# Patient Record
Sex: Female | Born: 1945
Health system: Southern US, Community
[De-identification: ages and names within clinical notes are randomized; demographics above are authoritative.]

## PROBLEM LIST (undated history)

## (undated) DIAGNOSIS — M1990 Unspecified osteoarthritis, unspecified site: Secondary | ICD-10-CM

## (undated) DIAGNOSIS — B019 Varicella without complication: Secondary | ICD-10-CM

## (undated) DIAGNOSIS — E669 Obesity, unspecified: Secondary | ICD-10-CM

## (undated) DIAGNOSIS — I499 Cardiac arrhythmia, unspecified: Secondary | ICD-10-CM

## (undated) DIAGNOSIS — T7840XA Allergy, unspecified, initial encounter: Secondary | ICD-10-CM

## (undated) DIAGNOSIS — H269 Unspecified cataract: Secondary | ICD-10-CM

## (undated) DIAGNOSIS — J09X2 Influenza due to identified novel influenza A virus with other respiratory manifestations: Secondary | ICD-10-CM

## (undated) DIAGNOSIS — J189 Pneumonia, unspecified organism: Secondary | ICD-10-CM

## (undated) DIAGNOSIS — M2550 Pain in unspecified joint: Secondary | ICD-10-CM

## (undated) DIAGNOSIS — E785 Hyperlipidemia, unspecified: Secondary | ICD-10-CM

## (undated) DIAGNOSIS — M549 Dorsalgia, unspecified: Secondary | ICD-10-CM

## (undated) DIAGNOSIS — R0789 Other chest pain: Secondary | ICD-10-CM

## (undated) DIAGNOSIS — I471 Supraventricular tachycardia, unspecified: Secondary | ICD-10-CM

## (undated) DIAGNOSIS — M419 Scoliosis, unspecified: Secondary | ICD-10-CM

## (undated) DIAGNOSIS — Z8719 Personal history of other diseases of the digestive system: Secondary | ICD-10-CM

## (undated) DIAGNOSIS — K219 Gastro-esophageal reflux disease without esophagitis: Secondary | ICD-10-CM

## (undated) HISTORY — PX: COLONOSCOPY WITH ESOPHAGOGASTRODUODENOSCOPY (EGD) AND ESOPHAGEAL DILATION (ED): SHX6495

## (undated) HISTORY — PX: BACK SURGERY: SHX140

## (undated) HISTORY — DX: Allergy, unspecified, initial encounter: T78.40XA

## (undated) HISTORY — DX: Scoliosis, unspecified: M41.9

## (undated) HISTORY — DX: Unspecified cataract: H26.9

## (undated) HISTORY — DX: Hyperlipidemia, unspecified: E78.5

## (undated) HISTORY — PX: CARDIAC ELECTROPHYSIOLOGY STUDY AND ABLATION: SHX1294

## (undated) HISTORY — DX: Unspecified osteoarthritis, unspecified site: M19.90

## (undated) HISTORY — DX: Gastro-esophageal reflux disease without esophagitis: K21.9

## (undated) HISTORY — PX: BREAST EXCISIONAL BIOPSY: SUR124

## (undated) HISTORY — DX: Other chest pain: R07.89

## (undated) HISTORY — DX: Obesity, unspecified: E66.9

## (undated) HISTORY — DX: Influenza due to identified novel influenza A virus with other respiratory manifestations: J09.X2

## (undated) HISTORY — DX: Varicella without complication: B01.9

## (undated) HISTORY — PX: JOINT REPLACEMENT: SHX530

## (undated) HISTORY — PX: FOOT SURGERY: SHX648

## (undated) HISTORY — PX: BREAST BIOPSY: SHX20

## (undated) HISTORY — DX: Supraventricular tachycardia: I47.1

## (undated) HISTORY — DX: Supraventricular tachycardia, unspecified: I47.10

## (undated) HISTORY — PX: SPINE SURGERY: SHX786

## (undated) HISTORY — PX: ABDOMINAL HYSTERECTOMY: SHX81

---

## 1988-05-19 HISTORY — PX: BREAST SURGERY: SHX581

## 1988-05-19 HISTORY — PX: PARTIAL HYSTERECTOMY: SHX80

## 2002-05-02 ENCOUNTER — Encounter: Payer: Self-pay | Admitting: Internal Medicine

## 2002-05-02 ENCOUNTER — Encounter: Admission: RE | Admit: 2002-05-02 | Discharge: 2002-05-02 | Payer: Self-pay | Admitting: Internal Medicine

## 2002-05-19 HISTORY — PX: OTHER SURGICAL HISTORY: SHX169

## 2003-04-18 ENCOUNTER — Inpatient Hospital Stay (HOSPITAL_COMMUNITY): Admission: RE | Admit: 2003-04-18 | Discharge: 2003-04-23 | Payer: Self-pay | Admitting: Orthopaedic Surgery

## 2003-05-04 ENCOUNTER — Inpatient Hospital Stay (HOSPITAL_COMMUNITY): Admission: RE | Admit: 2003-05-04 | Discharge: 2003-05-07 | Payer: Self-pay | Admitting: Orthopaedic Surgery

## 2004-05-19 HISTORY — PX: TOTAL HIP ARTHROPLASTY: SHX124

## 2005-05-19 HISTORY — PX: COLONOSCOPY: SHX174

## 2005-05-19 HISTORY — PX: EYE SURGERY: SHX253

## 2006-05-19 HISTORY — PX: BREAST BIOPSY: SHX20

## 2007-02-23 ENCOUNTER — Ambulatory Visit: Payer: Self-pay | Admitting: Unknown Physician Specialty

## 2007-03-04 ENCOUNTER — Ambulatory Visit: Payer: Self-pay | Admitting: Unknown Physician Specialty

## 2007-04-01 ENCOUNTER — Ambulatory Visit: Payer: Self-pay | Admitting: Unknown Physician Specialty

## 2007-10-11 ENCOUNTER — Ambulatory Visit: Payer: Self-pay | Admitting: General Surgery

## 2008-03-21 ENCOUNTER — Ambulatory Visit: Payer: Self-pay | Admitting: General Surgery

## 2008-04-26 ENCOUNTER — Ambulatory Visit: Payer: Self-pay | Admitting: General Surgery

## 2008-05-19 DIAGNOSIS — J189 Pneumonia, unspecified organism: Secondary | ICD-10-CM

## 2008-05-19 HISTORY — DX: Pneumonia, unspecified organism: J18.9

## 2009-04-30 ENCOUNTER — Ambulatory Visit: Payer: Self-pay | Admitting: General Surgery

## 2010-05-01 ENCOUNTER — Ambulatory Visit: Payer: Self-pay | Admitting: General Surgery

## 2011-03-17 ENCOUNTER — Ambulatory Visit: Payer: Self-pay | Admitting: Internal Medicine

## 2011-05-23 ENCOUNTER — Ambulatory Visit: Payer: Self-pay | Admitting: General Surgery

## 2011-09-24 ENCOUNTER — Emergency Department: Payer: Self-pay | Admitting: *Deleted

## 2011-09-24 LAB — CBC
HCT: 45 % (ref 35.0–47.0)
HGB: 14.9 g/dL (ref 12.0–16.0)
MCV: 92 fL (ref 80–100)
Platelet: 208 10*3/uL (ref 150–440)
RBC: 4.87 10*6/uL (ref 3.80–5.20)
RDW: 13.8 % (ref 11.5–14.5)
WBC: 3.4 10*3/uL — ABNORMAL LOW (ref 3.6–11.0)

## 2011-09-24 LAB — BASIC METABOLIC PANEL
BUN: 15 mg/dL (ref 7–18)
Co2: 25 mmol/L (ref 21–32)
Creatinine: 0.75 mg/dL (ref 0.60–1.30)
EGFR (African American): >60
EGFR (Non-African Amer.): >60
Glucose: 97 mg/dL (ref 65–99)
Sodium: 143 mmol/L (ref 136–145)

## 2011-09-24 LAB — CK TOTAL AND CKMB (NOT AT ARMC)
CK, Total: 90 U/L (ref 21–215)
CK-MB: 1.3 ng/mL (ref 0.5–3.6)

## 2011-09-24 LAB — TROPONIN I: Troponin-I: <0.02 ng/mL

## 2011-09-25 ENCOUNTER — Encounter: Payer: Self-pay | Admitting: Cardiovascular Disease

## 2011-09-25 ENCOUNTER — Ambulatory Visit (INDEPENDENT_AMBULATORY_CARE_PROVIDER_SITE_OTHER): Payer: BC Managed Care – PPO | Admitting: Cardiovascular Disease

## 2011-09-25 VITALS — BP 127/86 | HR 71 | Ht 66.0 in | Wt 185.1 lb

## 2011-09-25 DIAGNOSIS — R0609 Other forms of dyspnea: Secondary | ICD-10-CM

## 2011-09-25 DIAGNOSIS — R0989 Other specified symptoms and signs involving the circulatory and respiratory systems: Secondary | ICD-10-CM

## 2011-09-25 DIAGNOSIS — R002 Palpitations: Secondary | ICD-10-CM | POA: Insufficient documentation

## 2011-09-25 DIAGNOSIS — R0600 Dyspnea, unspecified: Secondary | ICD-10-CM | POA: Insufficient documentation

## 2011-09-25 NOTE — Progress Notes (Signed)
HPI  This is a 66 year old female who was referred from [facility] ER for evaluation of palpitations. She has no previous cardiac history and no significant chronic medical conditions. She presented there yesterday with sudden onset of palpitations and fast heart beats which happened at home while she was resting. This lasted for 30 minutes and by the time she arrived to the emergency room it was resolved. Her ECG was unremarkable, her labs showed no evidence of any mucoid volume depletion. She was mildly hypokalemic with a potassium of 3.4. Troponin and cardiac enzymes were normal. Chest x-ray showed no acute abnormalities. The patient reports no previous similar episodes. The palpitations were associated with dyspnea. She has not had any recent chest pain. She is able to perform all activities of daily living without significant limitations. She is not aware of any previous history of arrhythmia.  No Known Allergies   Current Outpatient Prescriptions on File Prior to Visit  Medication Sig Dispense Refill  . estradiol (ESTRACE) 2 MG tablet Take 2 mg by mouth daily.         Past Medical History  Diagnosis Date  . Cardiac disease   . Thyroid disease      Past Surgical History  Procedure Date  . Partial hysterectomy 1990  . Total hip arthroplasty 2006  . Other surgical history 2004    Bilateral cataracts  . Back surgery 1960's  . Foot surgery 1960's     Family History  Problem Relation Age of Onset  . Heart attack Father   . Heart disease Father      History   Social History  . Marital Status: Married    Spouse Name: N/A    Number of Children: N/A  . Years of Education: N/A   Occupational History  . Not on file.   Social History Main Topics  . Smoking status: Never Smoker   . Smokeless tobacco: Not on file  . Alcohol Use: 0.6 oz/week    1 Glasses of wine per week  . Drug Use: No  . Sexually Active:    Other Topics Concern  . Not on file   Social History  Narrative  . No narrative on file     ROS Constitutional: Negative for fever, chills, diaphoresis, activity change, appetite change and fatigue.  HENT: Negative for hearing loss, nosebleeds, congestion, sore throat, facial swelling, drooling, trouble swallowing, neck pain, voice change, sinus pressure and tinnitus.  Eyes: Negative for photophobia, pain, discharge and visual disturbance.  Respiratory: Negative for apnea, cough, chest tightness and wheezing.  Cardiovascular: Negative for chest pain  and leg swelling.  Gastrointestinal: Negative for nausea, vomiting, abdominal pain, diarrhea, constipation, blood in stool and abdominal distention.  Genitourinary: Negative for dysuria, urgency, frequency, hematuria and decreased urine volume.  Musculoskeletal: Negative for myalgias, back pain, joint swelling, arthralgias and gait problem.  Skin: Negative for color change, pallor, rash and wound.  Neurological: Negative for dizziness, tremors, seizures, syncope, speech difficulty, weakness, light-headedness, numbness and headaches.  Psychiatric/Behavioral: Negative for suicidal ideas, hallucinations, behavioral problems and agitation. The patient is not nervous/anxious.     PHYSICAL EXAM   BP 127/86  Pulse 71  Ht 5\' 6"  (1.676 m)  Wt 185 lb 1.9 oz (83.97 kg)  BMI 29.88 kg/m2 Constitutional: She is oriented to person, place, and time. She appears well-developed and well-nourished. No distress.  HENT: No nasal discharge.  Head: Normocephalic and atraumatic.  Eyes: Pupils are equal and round. Right eye exhibits no  discharge. Left eye exhibits no discharge.  Neck: Normal range of motion. Neck supple. No JVD present. No thyromegaly present.  Cardiovascular: Normal rate, regular rhythm, normal heart sounds. Exam reveals no gallop and no friction rub. No murmur heard.  Pulmonary/Chest: Effort normal and breath sounds normal. No stridor. No respiratory distress. She has no wheezes. She has no  rales. She exhibits no tenderness.  Abdominal: Soft. Bowel sounds are normal. She exhibits no distension. There is no tenderness. There is no rebound and no guarding.  Musculoskeletal: Normal range of motion. She exhibits no edema and no tenderness.  Neurological: She is alert and oriented to person, place, and time. Coordination normal.  Skin: Skin is warm and dry. No rash noted. She is not diaphoretic. No erythema. No pallor.  Psychiatric: She has a normal mood and affect. Her behavior is normal. Judgment and thought content normal.     EKG: Her ECG from yesterday was reviewed which showed normal sinus rhythm with poor R-wave progression in the anterior leads. No significant ST or T wave changes. Normal PR and QT intervals.   ASSESSMENT AND PLAN

## 2011-09-25 NOTE — Assessment & Plan Note (Signed)
The patient had a recent and a sudden episode of palpitations yesterday which lasted for about 30 minutes and resolves continuously. It was associated with dyspnea and mild dizziness without syncope or presyncope. I recommend a 48-hour Holter monitor for further evaluation. Treatment with a beta blocker or calcium channel blocker will be considered based on the results. The patient had no previous similar episodes. She does not consume caffeinated products and does not smoke. Her thyroid function was normal. There is no clear reversible causes.

## 2011-09-25 NOTE — Assessment & Plan Note (Signed)
I recommend an echocardiogram to evaluate for any structural heart abnormalities. The patient reports no chest pain and there is really no suspicion for angina. A stress test will not be pursued at this time.

## 2011-09-25 NOTE — Patient Instructions (Signed)
Your physician has requested that you have an echocardiogram. Echocardiography is a painless test that uses sound waves to create images of your heart. It provides your doctor with information about the size and shape of your heart and how well your heart's chambers and valves are working. This procedure takes approximately one hour. There are no restrictions for this procedure.  Your physician has recommended that you wear a holter monitor. Holter monitors are medical devices that record the heart's electrical activity. Doctors most often use these monitors to diagnose arrhythmias. Arrhythmias are problems with the speed or rhythm of the heartbeat. The monitor is a small, portable device. You can wear one while you do your normal daily activities. This is usually used to diagnose what is causing palpitations/syncope (passing out).  Follow up after tests.  

## 2011-10-14 ENCOUNTER — Telehealth: Payer: Self-pay | Admitting: Cardiovascular Disease

## 2011-10-14 ENCOUNTER — Other Ambulatory Visit: Payer: Self-pay

## 2011-10-14 DIAGNOSIS — R55 Syncope and collapse: Secondary | ICD-10-CM

## 2011-10-14 NOTE — Telephone Encounter (Signed)
Pt was to come and pick up a monitor tomorrow but needs to cancel. Wants to know if she can come another day or can she get it from [facility]. I told pt that we may not have one for her to get tomorrow

## 2011-10-14 NOTE — Telephone Encounter (Signed)
Notified patient need to be at [facility] at 10:00 am. The patient understands and will follow instructions.

## 2011-10-15 ENCOUNTER — Other Ambulatory Visit: Payer: BC Managed Care – PPO

## 2011-10-17 DIAGNOSIS — R55 Syncope and collapse: Secondary | ICD-10-CM

## 2011-10-27 ENCOUNTER — Ambulatory Visit (INDEPENDENT_AMBULATORY_CARE_PROVIDER_SITE_OTHER): Payer: BC Managed Care – PPO | Admitting: Cardiovascular Disease

## 2011-10-27 ENCOUNTER — Other Ambulatory Visit (INDEPENDENT_AMBULATORY_CARE_PROVIDER_SITE_OTHER): Payer: BC Managed Care – PPO

## 2011-10-27 ENCOUNTER — Other Ambulatory Visit: Payer: BC Managed Care – PPO

## 2011-10-27 ENCOUNTER — Encounter: Payer: Self-pay | Admitting: Cardiovascular Disease

## 2011-10-27 VITALS — BP 112/70 | HR 68 | Ht 66.0 in | Wt 181.0 lb

## 2011-10-27 DIAGNOSIS — R002 Palpitations: Secondary | ICD-10-CM

## 2011-10-27 DIAGNOSIS — R0600 Dyspnea, unspecified: Secondary | ICD-10-CM

## 2011-10-27 DIAGNOSIS — R0989 Other specified symptoms and signs involving the circulatory and respiratory systems: Secondary | ICD-10-CM

## 2011-10-27 DIAGNOSIS — R0609 Other forms of dyspnea: Secondary | ICD-10-CM

## 2011-10-27 NOTE — Patient Instructions (Addendum)
Your echocardiogram was fine.  Follow up as needed. Call us if you get more palpitations, shortness of breath or chest pain.

## 2011-10-30 NOTE — Assessment & Plan Note (Signed)
She has not had any further episodes. Holter monitor only showed occasional premature beats without significant arrhythmia. Echocardiogram also showed no significant abnormalities. No further cardiac workup at this time. Treatment with a beta blocker or calcium channel blockers can be considered if there are recurrent symptoms. In the meantime, I asked her to monitor her symptoms and notify us if there are any new issues. She is to followup as needed.

## 2011-10-30 NOTE — Progress Notes (Signed)
    HPI  This is a 66 year old female who is here today for a followup visit regarding palpitations. She has no previous cardiac history and no significant chronic medical conditions. She was seen at [facility] ER for palpitations that lasted about 30 minutes. There was no documented arrhythmia. Her ECG was unremarkable, her labs showed  mildl hypokalia with a potassium of 3.4. Troponin and cardiac enzymes were normal. Chest x-ray showed no acute abnormalities.  She underwent evaluation with a 48 hour Holter monitor which showed occasional PVCs and PVCs without any significant arrhythmia. She had an echocardiogram done which showed normal LV systolic function, no significant valvular abnormalities and only borderline elevation of pulmonary pressure. Overall, she is feeling better. She has not had any further palpitations. She denies chest pain or dyspnea.  No Known Allergies   Current Outpatient Prescriptions on File Prior to Visit  Medication Sig Dispense Refill  . estradiol (ESTRACE) 2 MG tablet Take 2 mg by mouth daily.         Past Medical History  Diagnosis Date  . Cardiac disease      Past Surgical History  Procedure Date  . Partial hysterectomy 1990  . Total hip arthroplasty 2006  . Other surgical history 2004    Bilateral cataracts  . Back surgery 1960's  . Foot surgery 1960's     Family History  Problem Relation Age of Onset  . Heart attack Father   . Heart disease Father      History   Social History  . Marital Status: Married    Spouse Name: N/A    Number of Children: N/A  . Years of Education: N/A   Occupational History  . Not on file.   Social History Main Topics  . Smoking status: Never Smoker   . Smokeless tobacco: Not on file  . Alcohol Use: 0.6 oz/week    1 Glasses of wine per week  . Drug Use: No  . Sexually Active:    Other Topics Concern  . Not on file   Social History Narrative  . No narrative on file     PHYSICAL EXAM   BP 112/70   Pulse 68  Ht 5\' 6"  (1.676 m)  Wt 181 lb (82.101 kg)  BMI 29.21 kg/m2  Constitutional: She is oriented to person, place, and time. She appears well-developed and well-nourished. No distress.  HENT: No nasal discharge.  Head: Normocephalic and atraumatic.  Eyes: Pupils are equal and round. Right eye exhibits no discharge. Left eye exhibits no discharge.  Neck: Normal range of motion. Neck supple. No JVD present. No thyromegaly present.  Cardiovascular: Normal rate, regular rhythm, normal heart sounds. Exam reveals no gallop and no friction rub. No murmur heard.  Pulmonary/Chest: Effort normal and breath sounds normal. No stridor. No respiratory distress. She has no wheezes. She has no rales. She exhibits no tenderness.  Abdominal: Soft. Bowel sounds are normal. She exhibits no distension. There is no tenderness. There is no rebound and no guarding.  Musculoskeletal: Normal range of motion. She exhibits no edema and no tenderness.  Neurological: She is alert and oriented to person, place, and time. Coordination normal.  Skin: Skin is warm and dry. No rash noted. She is not diaphoretic. No erythema. No pallor.  Psychiatric: She has a normal mood and affect. Her behavior is normal. Judgment and thought content normal.      ASSESSMENT AND PLAN

## 2011-11-03 ENCOUNTER — Other Ambulatory Visit: Payer: Self-pay | Admitting: Cardiovascular Disease

## 2011-11-03 DIAGNOSIS — R55 Syncope and collapse: Secondary | ICD-10-CM

## 2012-05-24 ENCOUNTER — Ambulatory Visit: Payer: Self-pay | Admitting: General Surgery

## 2012-11-15 ENCOUNTER — Encounter: Payer: Self-pay | Admitting: *Deleted

## 2013-06-06 ENCOUNTER — Ambulatory Visit: Payer: Self-pay | Admitting: General Surgery

## 2013-06-07 ENCOUNTER — Ambulatory Visit: Payer: Self-pay | Admitting: General Surgery

## 2013-06-07 ENCOUNTER — Encounter: Payer: Self-pay | Admitting: General Surgery

## 2013-06-20 ENCOUNTER — Encounter: Payer: Self-pay | Admitting: General Surgery

## 2013-06-20 ENCOUNTER — Ambulatory Visit (INDEPENDENT_AMBULATORY_CARE_PROVIDER_SITE_OTHER): Payer: BC Managed Care – PPO | Admitting: General Surgery

## 2013-06-20 VITALS — BP 140/74 | HR 72 | Resp 14 | Ht 66.0 in | Wt 187.0 lb

## 2013-06-20 DIAGNOSIS — N6019 Diffuse cystic mastopathy of unspecified breast: Secondary | ICD-10-CM

## 2013-06-20 DIAGNOSIS — Z803 Family history of malignant neoplasm of breast: Secondary | ICD-10-CM

## 2013-06-20 DIAGNOSIS — Z1239 Encounter for other screening for malignant neoplasm of breast: Secondary | ICD-10-CM

## 2013-06-20 NOTE — Patient Instructions (Signed)
Patient to return in 1 year with bilateral screening mammogram. Patient to continue self breast checks monthly. Patient to call with any new questions or concerns.

## 2013-06-20 NOTE — Progress Notes (Signed)
Patient ID: Kristy Mejia, female   DOB: 02/09/1946, 68 y.o.   MRN: [id number]  Chief Complaint  Patient presents with  . Follow-up    mammogram    HPI Kristy Mejia is a 68 y.o. female who presents for a breast evaluation. The most recent mammogram was done on 06/06/13. Patient does not perform regular self breast checks but does get regular mammograms done.  She denies any problems with her breasts at this time.   HPI  Past Medical History  Diagnosis Date  . Cardiac disease   . Family history of malignant neoplasm of breast 2014  . Breast complaint 1990  . Obesity, unspecified   . Special screening for malignant neoplasms, colon   . Eye problems 2007    eye condition  . Arthritis   . Diffuse cystic mastopathy     Past Surgical History  Procedure Laterality Date  . Partial hysterectomy  1990  . Total hip arthroplasty  2006  . Other surgical history  2004    Bilateral cataracts  . Back surgery  1960's    d/t scoliosis-1964/65?  Marland Kitchen. Foot surgery  1960's  . Colonoscopy  2007    Dr. Mechele CollinElliott  . Eye surgery  2007    cataracts  . Breast surgery  1990    excision breast mass    Family History  Problem Relation Age of Onset  . Heart attack Father   . Heart disease Father   . Cancer Mother     breast cancer    Social History History  Substance Use Topics  . Smoking status: Never Smoker   . Smokeless tobacco: Not on file  . Alcohol Use: 0.6 oz/week    1 Glasses of wine per week    Allergies  Allergen Reactions  . Tape Itching, Rash and Other (See Comments)    blisters    Current Outpatient Prescriptions  Medication Sig Dispense Refill  . estradiol (ESTRACE) 2 MG tablet Take 2 mg by mouth daily.       No current facility-administered medications for this visit.    Review of Systems Review of Systems  Constitutional: Negative.   Respiratory: Negative.   Cardiovascular: Negative.     Blood pressure 140/74, pulse 72, resp. rate 14, height 5\' 6"  (1.676 m),  weight 187 lb (84.823 kg).  Physical Exam Physical Exam  Constitutional: She is oriented to person, place, and time. She appears well-developed and well-nourished.  Eyes: Conjunctivae are normal. No scleral icterus.  Neck: Neck supple. No thyromegaly present.  Cardiovascular: Normal rate, regular rhythm and normal heart sounds.   No murmur heard. Pulmonary/Chest: Effort normal and breath sounds normal. Right breast exhibits no inverted nipple, no mass, no nipple discharge, no skin change and no tenderness. Left breast exhibits no inverted nipple, no mass, no nipple discharge, no skin change and no tenderness.  Lymphadenopathy:    She has no cervical adenopathy.    She has no axillary adenopathy.  Neurological: She is alert and oriented to person, place, and time.  Skin: Skin is warm and dry.    Data Reviewed Mammogram reviewed   Assessment    Stable exam. FH of breast cancer    Plan    Patient to return in one year      Kristy Mejia 06/22/2013, 8:36 AM

## 2013-06-22 ENCOUNTER — Encounter: Payer: Self-pay | Admitting: General Surgery

## 2013-06-22 DIAGNOSIS — Z803 Family history of malignant neoplasm of breast: Secondary | ICD-10-CM | POA: Insufficient documentation

## 2013-11-28 ENCOUNTER — Ambulatory Visit: Payer: Self-pay | Admitting: Orthopedic Surgery

## 2013-11-30 ENCOUNTER — Ambulatory Visit: Payer: Self-pay | Admitting: Orthopedic Surgery

## 2014-03-14 ENCOUNTER — Other Ambulatory Visit: Payer: Self-pay | Admitting: Orthopedic Surgery

## 2014-03-20 ENCOUNTER — Encounter: Payer: Self-pay | Admitting: General Surgery

## 2014-04-07 ENCOUNTER — Ambulatory Visit (HOSPITAL_COMMUNITY)
Admission: RE | Admit: 2014-04-07 | Discharge: 2014-04-07 | Disposition: A | Discharge status: Home / Self Care | Payer: BC Managed Care – PPO | Source: Ambulatory Visit | Attending: Orthopedic Surgery | Admitting: Orthopedic Surgery

## 2014-04-07 ENCOUNTER — Encounter (HOSPITAL_COMMUNITY)
Admission: RE | Admit: 2014-04-07 | Discharge: 2014-04-07 | Disposition: A | Discharge status: Home / Self Care | Payer: BC Managed Care – PPO | Source: Ambulatory Visit | Attending: Orthopedic Surgery | Admitting: Orthopedic Surgery

## 2014-04-07 ENCOUNTER — Encounter (HOSPITAL_COMMUNITY): Payer: Self-pay

## 2014-04-07 DIAGNOSIS — M1990 Unspecified osteoarthritis, unspecified site: Secondary | ICD-10-CM | POA: Diagnosis not present

## 2014-04-07 DIAGNOSIS — E669 Obesity, unspecified: Secondary | ICD-10-CM | POA: Insufficient documentation

## 2014-04-07 DIAGNOSIS — M419 Scoliosis, unspecified: Secondary | ICD-10-CM | POA: Diagnosis not present

## 2014-04-07 DIAGNOSIS — Z01818 Encounter for other preprocedural examination: Secondary | ICD-10-CM | POA: Diagnosis present

## 2014-04-07 HISTORY — DX: Pain in unspecified joint: M25.50

## 2014-04-07 HISTORY — DX: Dorsalgia, unspecified: M54.9

## 2014-04-07 HISTORY — DX: Pneumonia, unspecified organism: J18.9

## 2014-04-07 LAB — CBC WITH DIFFERENTIAL/PLATELET
Basophils Absolute: 0.1 10*3/uL (ref 0.0–0.1)
Basophils Relative: 2 % — ABNORMAL HIGH (ref 0–1)
EOS ABS: 0.2 10*3/uL (ref 0.0–0.7)
EOS PCT: 5 % (ref 0–5)
HEMATOCRIT: 42.9 % (ref 36.0–46.0)
Hemoglobin: 14.1 g/dL (ref 12.0–15.0)
LYMPHS PCT: 43 % (ref 12–46)
Lymphs Abs: 1.5 10*3/uL (ref 0.7–4.0)
MCH: 30.2 pg (ref 26.0–34.0)
MCHC: 32.9 g/dL (ref 30.0–36.0)
MCV: 91.9 fL (ref 78.0–100.0)
MONO ABS: 0.4 10*3/uL (ref 0.1–1.0)
Monocytes Relative: 11 % (ref 3–12)
Neutro Abs: 1.4 10*3/uL — ABNORMAL LOW (ref 1.7–7.7)
Neutrophils Relative %: 39 % — ABNORMAL LOW (ref 43–77)
PLATELETS: 240 10*3/uL (ref 150–400)
RBC: 4.67 MIL/uL (ref 3.87–5.11)
RDW: 13.6 % (ref 11.5–15.5)
WBC: 3.6 10*3/uL — ABNORMAL LOW (ref 4.0–10.5)

## 2014-04-07 LAB — TYPE AND SCREEN
ABO/RH(D): O POS
ANTIBODY SCREEN: NEGATIVE

## 2014-04-07 LAB — ABO/RH: ABO/RH(D): O POS

## 2014-04-07 LAB — BASIC METABOLIC PANEL
Anion gap: 12 (ref 5–15)
BUN: 15 mg/dL (ref 6–23)
CO2: 25 meq/L (ref 19–32)
Calcium: 9.7 mg/dL (ref 8.4–10.5)
Chloride: 104 mEq/L (ref 96–112)
Creatinine, Ser: 0.73 mg/dL (ref 0.50–1.10)
GFR calc Af Amer: >90 mL/min (ref >90)
GFR, EST NON AFRICAN AMERICAN: 87 mL/min — AB (ref >90)
GLUCOSE: 101 mg/dL — AB (ref 70–99)
Potassium: 3.9 mEq/L (ref 3.7–5.3)
Sodium: 141 mEq/L (ref 137–147)

## 2014-04-07 LAB — SURGICAL PCR SCREEN
MRSA, PCR: NEGATIVE
Staphylococcus aureus: NEGATIVE

## 2014-04-07 LAB — APTT: aPTT: 31 seconds (ref 24–37)

## 2014-04-07 LAB — PROTIME-INR
INR: 0.97 (ref 0.00–1.49)
PROTHROMBIN TIME: 13 s (ref 11.6–15.2)

## 2014-04-07 NOTE — Progress Notes (Signed)
Anesthesia Chart Review:  Patient is a 68 year old female scheduled for revision of left THA on 04/17/14 by Dr. Turner Danielsowan.  History includes non-smoker, PNA '10, arthritis, scoliosis s/p surgery, left THA '06. BMI is consistent with mild obesity.  PCP is Dr. Andi DevonKimberly Shelton. She was seen by cardiologist Dr. Kirke CorinArida in 2013 for palpitations and was discharged with PRN follow-up based on echo and Holter results.  EKG on 04/07/14 showed: NSR, possible LAE, septal infarct (age undetermined).  It was not felt significantly changed since her last tracing (04/12/03 [facility]).  Echo 10/27/11: - Left ventricle: The cavity size was normal. Wall thickness was increased in a pattern of mild LVH. Systolic function was normal. Wall motion was normal; there were no regionalwall motion abnormalities. Left ventricular diastolicfunction parameters were normal. - Right ventricle: Systolic function was normal. - Tricuspid valve: Mild regurgitation. - Pulmonary arteries: PA peak pressure: 34mm Hg (S).  24 hour Holter monitor 10/2011 showed predominantly SR with periods of SB and ST, occasional multiple focal PVCs and rare PACs.  CXR on 04/07/14: No active cardiopulmonary disease.  Preoperative labs noted. Urine culture in process.  Velna Ochsllison Jhamal Plucinski, PA-C [facility]Anesthesiology Phone [phone] 04/07/2014 5:36 PM

## 2014-04-07 NOTE — Pre-Procedure Instructions (Signed)
Kristy Mejia  04/07/2014   Your procedure is scheduled on:  Mon, Nov 30 @ 7:30 AM  [facility]  at 5:30 AM.  Call this number if you have problems the morning of surgery: [phone]   Remember:   Do not eat food or drink liquids after midnight.                 Stop taking your Mobic a week before surgery. No Goody's,BC's,Aleve,Ibuprofen,Aspirin,Fish Oil,or any Herbal Medications   Do not wear jewelry, make-up or nail polish.  Do not wear lotions, powders, or perfumes. You may wear deodorant.  Do not shave 48 hours prior to surgery.   Do not bring valuables to the hospital.  [facility] is not responsible                  for any belongings or valuables.               Contacts, dentures or bridgework may not be worn into surgery.  Leave suitcase in the car. After surgery it may be brought to your room.  For patients admitted to the hospital, discharge time is determined by your                treatment team.                  Special Instructions:  [place] - Preparing for Surgery  Before surgery, you can play an important role.  Because skin is not sterile, your skin needs to be as free of germs as possible.  You can reduce the number of germs on you skin by washing with CHG (chlorahexidine gluconate) soap before surgery.  CHG is an antiseptic cleaner which kills germs and bonds with the skin to continue killing germs even after washing.  Please DO NOT use if you have an allergy to CHG or antibacterial soaps.  If your skin becomes reddened/irritated stop using the CHG and inform your nurse when you arrive at Short Stay.  Do not shave (including legs and underarms) for at least 48 hours prior to the first CHG shower.  You may shave your face.  Please follow these instructions carefully:   1.  Shower with CHG Soap the night before surgery and the                                morning of Surgery.  2.  If you choose to wash your hair, wash your hair first as  usual with your       normal shampoo.  3.  After you shampoo, rinse your hair and body thoroughly to remove the                      Shampoo.  4.  Use CHG as you would any other liquid soap.  You can apply chg directly       to the skin and wash gently with scrungie or a clean washcloth.  5.  Apply the CHG Soap to your body ONLY FROM THE NECK DOWN.        Do not use on open wounds or open sores.  Avoid contact with your eyes,       ears, mouth and genitals (private parts).  Wash genitals (private parts)       with your normal soap.  6.  Wash thoroughly,  paying special attention to the area where your surgery        will be performed.  7.  Thoroughly rinse your body with warm water from the neck down.  8.  DO NOT shower/wash with your normal soap after using and rinsing off       the CHG Soap.  9.  Pat yourself dry with a clean towel.            10.  Wear clean pajamas.            11.  Place clean sheets on your bed the night of your first shower and do not        sleep with pets.  Day of Surgery  Do not apply any lotions/deoderants the morning of surgery.  Please wear clean clothes to the hospital/surgery center.     Please read over the following fact sheets that you were given: Pain Booklet, Coughing and Deep Breathing, Blood Transfusion Information, MRSA Information and Surgical Site Infection Prevention

## 2014-04-07 NOTE — Progress Notes (Addendum)
Saw Dr.Arida in 2013 but no follow up needed  Echo report in epic from 2013  Denies ever having a stress test/heart cath    Denies CXR in past yr  Medical Md is with Dr.Kimberly Shelton(but has not seen her yet

## 2014-04-08 LAB — URINE CULTURE

## 2014-04-11 NOTE — H&P (Signed)
TOTAL HIP REVISION ADMISSION H&P  Patient is admitted for left revision total hip arthroplasty.  Subjective:  Chief Complaint: left hip pain  HPI: Kristy Mejia, 68 y.o. female, has a history of pain and functional disability in the left hip due to arthritis and patient has failed non-surgical conservative treatments for greater than 12 weeks to include NSAID's and/or analgesics, use of assistive devices, weight reduction as appropriate and activity modification. The indications for the revision total hip arthroplasty are bearing surface wear leading to  implant or hip misalignment and hip instability.  Onset of symptoms was gradual starting >10 years ago with gradually worsening course since that time.  Prior procedures on the left hip include arthroplasty.  Patient currently rates pain in the left hip at 10 out of 10 with activity.  There is night pain, worsening of pain with activity and weight bearing, pain that interfers with activities of daily living, pain with passive range of motion and crepitus. Patient has evidence of joint subluxation and joint space narrowing by imaging studies.  This condition presents safety issues increasing the risk of falls.   There is no current active infection.  Patient Active Problem List   Diagnosis Date Noted  . Family history of breast cancer 06/22/2013  . Diffuse cystic mastopathy 06/20/2013  . Palpitations 09/25/2011  . Dyspnea 09/25/2011   Past Medical History  Diagnosis Date  . Obesity, unspecified   . Arthritis   . Pneumonia 2010    hx of  . Joint pain   . Back pain     scoliosis    Past Surgical History  Procedure Laterality Date  . Partial hysterectomy  1990  . Total hip arthroplasty  2006  . Other surgical history  2004    Bilateral cataracts  . Back surgery  1960's    d/t scoliosis-1964/65?  Marland Kitchen. Foot surgery  1960's  . Colonoscopy  2007    Dr. Mechele CollinElliott  . Eye surgery  2007    cataracts  . Breast surgery  1990    excision breast  mass  . Colonoscopy with esophagogastroduodenoscopy (egd) and esophageal dilation (ed)      No prescriptions prior to admission   Allergies  Allergen Reactions  . Tape Itching, Rash and Other (See Comments)    blisters    History  Substance Use Topics  . Smoking status: Never Smoker   . Smokeless tobacco: Not on file  . Alcohol Use: No    Family History  Problem Relation Age of Onset  . Heart attack Father   . Heart disease Father   . Cancer Mother     breast cancer      Review of Systems  Constitutional: Positive for malaise/fatigue and diaphoresis.  HENT: Negative.   Eyes: Negative.   Respiratory: Negative.   Cardiovascular: Negative.   Gastrointestinal: Negative.   Genitourinary: Negative.   Musculoskeletal: Positive for joint pain.  Skin: Negative.   Neurological: Negative.   Endo/Heme/Allergies: Negative.   Psychiatric/Behavioral: The patient has insomnia.     Objective:  Physical Exam  Constitutional: She is oriented to person, place, and time. She appears well-developed and well-nourished.  HENT:  Head: Normocephalic and atraumatic.  Eyes: Pupils are equal, round, and reactive to light.  Neck: Normal range of motion. Neck supple.  Cardiovascular: Intact distal pulses.   Respiratory: Effort normal and breath sounds normal.  Musculoskeletal: She exhibits tenderness.  Her total scar is well-healed is soft, nontender.  There is no erythema.  She denies any fevers or chills.  Foot tap is negative.  She has minimal external rotation in the seated position.    Neurological: She is alert and oriented to person, place, and time.  Skin: Skin is warm and dry.  Psychiatric: She has a normal mood and affect. Her behavior is normal. Judgment and thought content normal.    Vital signs in last 24 hours:     Labs:   Estimated body mass index is 30.20 kg/(m^2) as calculated from the following:   Height as of 06/20/13: 5\' 6"  (1.676 m).   Weight as of 06/20/13: 84.823  kg (187 lb).  Imaging Review:  Plain radiographs demonstrate that the cup is relatively vertical at around 75 with significant anti-version of a proximally 60.  Assessment/Plan:  Left total hip with relatively vertical and anteverted acetabular component, significant superior polyethylene wear and sensation of subluxation.  The patient history, physical examination, clinical judgement of the provider and imaging studies are consistent with end stage degenerative joint disease of the left hip(s), previous total hip arthroplasty. Revision total hip arthroplasty is deemed medically necessary. The treatment options including medical management, injection therapy, arthroscopy and arthroplasty were discussed at length. The risks and benefits of total hip arthroplasty were presented and reviewed. The risks due to aseptic loosening, infection, stiffness, dislocation/subluxation,  thromboembolic complications and other imponderables were discussed.  The patient acknowledged the explanation, agreed to proceed with the plan and consent was signed. Patient is being admitted for inpatient treatment for surgery, pain control, PT, OT, prophylactic antibiotics, VTE prophylaxis, progressive ambulation and ADL's and discharge planning. The patient is planning to be discharged to skilled nursing facility

## 2014-04-14 DIAGNOSIS — T84018A Broken internal joint prosthesis, other site, initial encounter: Secondary | ICD-10-CM

## 2014-04-14 DIAGNOSIS — Z96649 Presence of unspecified artificial hip joint: Secondary | ICD-10-CM

## 2014-04-16 MED ORDER — KCL IN DEXTROSE-NACL 20-5-0.2 MEQ/L-%-% IV SOLN
INTRAVENOUS | Status: DC
  Filled 2014-04-16 (×2): qty 1000

## 2014-04-16 MED ORDER — CEFAZOLIN SODIUM-DEXTROSE 2-3 GM-% IV SOLR
2.0000 g | INTRAVENOUS | Status: AC
  Administered 2014-04-17: 2 g via INTRAVENOUS
  Filled 2014-04-16: qty 50

## 2014-04-16 MED ORDER — TRANEXAMIC ACID 100 MG/ML IV SOLN
1000.0000 mg | INTRAVENOUS | Status: AC
  Administered 2014-04-17: 1000 mg via INTRAVENOUS
  Filled 2014-04-16: qty 10

## 2014-04-17 ENCOUNTER — Inpatient Hospital Stay (HOSPITAL_COMMUNITY): Payer: BC Managed Care – PPO | Admitting: Vascular Surgery

## 2014-04-17 ENCOUNTER — Inpatient Hospital Stay (HOSPITAL_COMMUNITY)
Admission: RE | Admit: 2014-04-17 | Discharge: 2014-04-19 | DRG: 465 | Disposition: A | Discharge status: Home / Self Care | Payer: BC Managed Care – PPO | Source: Ambulatory Visit | Attending: Orthopedic Surgery | Admitting: Orthopedic Surgery

## 2014-04-17 ENCOUNTER — Encounter (HOSPITAL_COMMUNITY): Payer: Self-pay | Admitting: *Deleted

## 2014-04-17 ENCOUNTER — Encounter (HOSPITAL_COMMUNITY)
Admission: RE | Disposition: A | Discharge status: Home / Self Care | Payer: Self-pay | Source: Ambulatory Visit | Attending: Orthopedic Surgery

## 2014-04-17 ENCOUNTER — Inpatient Hospital Stay (HOSPITAL_COMMUNITY): Payer: BC Managed Care – PPO

## 2014-04-17 ENCOUNTER — Inpatient Hospital Stay (HOSPITAL_COMMUNITY): Payer: BC Managed Care – PPO | Admitting: Anesthesiology

## 2014-04-17 DIAGNOSIS — E669 Obesity, unspecified: Secondary | ICD-10-CM | POA: Diagnosis present

## 2014-04-17 DIAGNOSIS — M419 Scoliosis, unspecified: Secondary | ICD-10-CM | POA: Diagnosis present

## 2014-04-17 DIAGNOSIS — Z96649 Presence of unspecified artificial hip joint: Secondary | ICD-10-CM

## 2014-04-17 DIAGNOSIS — T8484XA Pain due to internal orthopedic prosthetic devices, implants and grafts, initial encounter: Secondary | ICD-10-CM | POA: Diagnosis present

## 2014-04-17 DIAGNOSIS — T84018A Broken internal joint prosthesis, other site, initial encounter: Secondary | ICD-10-CM

## 2014-04-17 DIAGNOSIS — M25552 Pain in left hip: Secondary | ICD-10-CM | POA: Diagnosis present

## 2014-04-17 DIAGNOSIS — Y929 Unspecified place or not applicable: Secondary | ICD-10-CM | POA: Diagnosis not present

## 2014-04-17 DIAGNOSIS — Z6830 Body mass index (BMI) 30.0-30.9, adult: Secondary | ICD-10-CM

## 2014-04-17 HISTORY — PX: TOTAL HIP REVISION: SHX763

## 2014-04-17 HISTORY — PX: REVISION TOTAL HIP ARTHROPLASTY: SHX766

## 2014-04-17 SURGERY — TOTAL HIP REVISION
Anesthesia: General | Site: Hip | Laterality: Left

## 2014-04-17 MED ORDER — LIDOCAINE HCL (CARDIAC) 20 MG/ML IV SOLN
INTRAVENOUS | Status: DC | PRN
  Administered 2014-04-17: 100 mg via INTRAVENOUS

## 2014-04-17 MED ORDER — SENNOSIDES-DOCUSATE SODIUM 8.6-50 MG PO TABS: 1.0000 | ORAL_TABLET | Freq: Every evening | ORAL | Status: DC | PRN

## 2014-04-17 MED ORDER — METHOCARBAMOL 500 MG PO TABS
500.0000 mg | ORAL_TABLET | Freq: Four times a day (QID) | ORAL | Status: DC | PRN
  Administered 2014-04-17 – 2014-04-19 (×4): 500 mg via ORAL
  Filled 2014-04-17 (×4): qty 1

## 2014-04-17 MED ORDER — ONDANSETRON HCL 4 MG/2ML IJ SOLN
INTRAMUSCULAR | Status: AC
  Filled 2014-04-17: qty 2

## 2014-04-17 MED ORDER — PROPOFOL 10 MG/ML IV BOLUS
INTRAVENOUS | Status: AC
  Filled 2014-04-17: qty 20

## 2014-04-17 MED ORDER — BUPIVACAINE-EPINEPHRINE 0.5% -1:200000 IJ SOLN
INTRAMUSCULAR | Status: DC | PRN
  Administered 2014-04-17: 20 mL

## 2014-04-17 MED ORDER — MENTHOL 3 MG MT LOZG: 1.0000 | LOZENGE | OROMUCOSAL | Status: DC | PRN

## 2014-04-17 MED ORDER — METOCLOPRAMIDE HCL 5 MG PO TABS
5.0000 mg | ORAL_TABLET | Freq: Three times a day (TID) | ORAL | Status: DC | PRN
  Filled 2014-04-17: qty 2

## 2014-04-17 MED ORDER — NEOSTIGMINE METHYLSULFATE 10 MG/10ML IV SOLN
INTRAVENOUS | Status: DC | PRN
  Administered 2014-04-17: 4 mg via INTRAVENOUS

## 2014-04-17 MED ORDER — EPHEDRINE SULFATE 50 MG/ML IJ SOLN
INTRAMUSCULAR | Status: DC | PRN
  Administered 2014-04-17: 10 mg via INTRAVENOUS

## 2014-04-17 MED ORDER — LIDOCAINE HCL (CARDIAC) 20 MG/ML IV SOLN
INTRAVENOUS | Status: AC
  Filled 2014-04-17: qty 5

## 2014-04-17 MED ORDER — ARTIFICIAL TEARS OP OINT
TOPICAL_OINTMENT | OPHTHALMIC | Status: AC
  Filled 2014-04-17: qty 3.5

## 2014-04-17 MED ORDER — SODIUM CHLORIDE 0.9 % IV SOLN
INTRAVENOUS | Status: DC | PRN
  Administered 2014-04-17: 08:00:00 via INTRAVENOUS

## 2014-04-17 MED ORDER — KCL IN DEXTROSE-NACL 20-5-0.45 MEQ/L-%-% IV SOLN
INTRAVENOUS | Status: DC
  Administered 2014-04-17 – 2014-04-18 (×3): via INTRAVENOUS
  Filled 2014-04-17 (×9): qty 1000

## 2014-04-17 MED ORDER — PHENYLEPHRINE 40 MCG/ML (10ML) SYRINGE FOR IV PUSH (FOR BLOOD PRESSURE SUPPORT)
PREFILLED_SYRINGE | INTRAVENOUS | Status: AC
  Filled 2014-04-17: qty 10

## 2014-04-17 MED ORDER — PHENYLEPHRINE HCL 10 MG/ML IJ SOLN
INTRAMUSCULAR | Status: DC | PRN
  Administered 2014-04-17 (×2): 40 ug via INTRAVENOUS
  Administered 2014-04-17: 80 ug via INTRAVENOUS
  Administered 2014-04-17: 40 ug via INTRAVENOUS
  Administered 2014-04-17: 80 ug via INTRAVENOUS
  Administered 2014-04-17: 40 ug via INTRAVENOUS
  Administered 2014-04-17 (×2): 80 ug via INTRAVENOUS

## 2014-04-17 MED ORDER — HYDROMORPHONE HCL 1 MG/ML IJ SOLN: 1.0000 mg | INTRAMUSCULAR | Status: DC | PRN

## 2014-04-17 MED ORDER — ROCURONIUM BROMIDE 100 MG/10ML IV SOLN
INTRAVENOUS | Status: DC | PRN
  Administered 2014-04-17: 40 mg via INTRAVENOUS

## 2014-04-17 MED ORDER — GLYCOPYRROLATE 0.2 MG/ML IJ SOLN
INTRAMUSCULAR | Status: DC | PRN
  Administered 2014-04-17: 0.6 mg via INTRAVENOUS

## 2014-04-17 MED ORDER — LACTATED RINGERS IV SOLN
INTRAVENOUS | Status: DC | PRN
  Administered 2014-04-17 (×2): via INTRAVENOUS

## 2014-04-17 MED ORDER — FENTANYL CITRATE 0.05 MG/ML IJ SOLN
INTRAMUSCULAR | Status: AC
  Filled 2014-04-17: qty 2

## 2014-04-17 MED ORDER — SODIUM CHLORIDE 0.9 % IJ SOLN
INTRAMUSCULAR | Status: AC
  Filled 2014-04-17: qty 10

## 2014-04-17 MED ORDER — EPHEDRINE SULFATE 50 MG/ML IJ SOLN
INTRAMUSCULAR | Status: AC
  Filled 2014-04-17: qty 1

## 2014-04-17 MED ORDER — DEXAMETHASONE SODIUM PHOSPHATE 10 MG/ML IJ SOLN
INTRAMUSCULAR | Status: AC
  Filled 2014-04-17: qty 1

## 2014-04-17 MED ORDER — METHOCARBAMOL 1000 MG/10ML IJ SOLN
500.0000 mg | Freq: Four times a day (QID) | INTRAVENOUS | Status: DC | PRN
  Filled 2014-04-17: qty 5

## 2014-04-17 MED ORDER — ASPIRIN EC 325 MG PO TBEC
325.0000 mg | DELAYED_RELEASE_TABLET | Freq: Every day | ORAL | Status: DC
  Administered 2014-04-18 – 2014-04-19 (×2): 325 mg via ORAL
  Filled 2014-04-17 (×3): qty 1

## 2014-04-17 MED ORDER — MIDAZOLAM HCL 5 MG/5ML IJ SOLN
INTRAMUSCULAR | Status: DC | PRN
  Administered 2014-04-17 (×2): 0.5 mg via INTRAVENOUS

## 2014-04-17 MED ORDER — METHOCARBAMOL 500 MG PO TABS: 500.0000 mg | ORAL_TABLET | Freq: Two times a day (BID) | ORAL | Status: DC

## 2014-04-17 MED ORDER — DOCUSATE SODIUM 100 MG PO CAPS
100.0000 mg | ORAL_CAPSULE | Freq: Two times a day (BID) | ORAL | Status: DC
  Administered 2014-04-17 – 2014-04-19 (×4): 100 mg via ORAL
  Filled 2014-04-17 (×6): qty 1

## 2014-04-17 MED ORDER — ONDANSETRON HCL 4 MG PO TABS: 4.0000 mg | ORAL_TABLET | Freq: Four times a day (QID) | ORAL | Status: DC | PRN

## 2014-04-17 MED ORDER — BUPIVACAINE-EPINEPHRINE (PF) 0.5% -1:200000 IJ SOLN
INTRAMUSCULAR | Status: AC
  Filled 2014-04-17: qty 30

## 2014-04-17 MED ORDER — 0.9 % SODIUM CHLORIDE (POUR BTL) OPTIME
TOPICAL | Status: DC | PRN
  Administered 2014-04-17: 1000 mL

## 2014-04-17 MED ORDER — DIPHENHYDRAMINE HCL 12.5 MG/5ML PO ELIX: 12.5000 mg | ORAL_SOLUTION | ORAL | Status: DC | PRN

## 2014-04-17 MED ORDER — PHENOL 1.4 % MT LIQD: 1.0000 | OROMUCOSAL | Status: DC | PRN

## 2014-04-17 MED ORDER — ROCURONIUM BROMIDE 50 MG/5ML IV SOLN
INTRAVENOUS | Status: AC
  Filled 2014-04-17: qty 1

## 2014-04-17 MED ORDER — ONDANSETRON HCL 4 MG/2ML IJ SOLN
INTRAMUSCULAR | Status: DC | PRN
  Administered 2014-04-17: 4 mg via INTRAVENOUS

## 2014-04-17 MED ORDER — PROMETHAZINE HCL 25 MG/ML IJ SOLN: 6.2500 mg | INTRAMUSCULAR | Status: DC | PRN

## 2014-04-17 MED ORDER — GLYCOPYRROLATE 0.2 MG/ML IJ SOLN
INTRAMUSCULAR | Status: AC
  Filled 2014-04-17: qty 3

## 2014-04-17 MED ORDER — ACETAMINOPHEN 325 MG PO TABS
650.0000 mg | ORAL_TABLET | Freq: Four times a day (QID) | ORAL | Status: DC | PRN
  Administered 2014-04-17 – 2014-04-19 (×6): 650 mg via ORAL
  Filled 2014-04-17 (×6): qty 2

## 2014-04-17 MED ORDER — FENTANYL CITRATE 0.05 MG/ML IJ SOLN
INTRAMUSCULAR | Status: DC | PRN
  Administered 2014-04-17 (×2): 25 ug via INTRAVENOUS
  Administered 2014-04-17 (×2): 50 ug via INTRAVENOUS

## 2014-04-17 MED ORDER — ACETAMINOPHEN 650 MG RE SUPP
650.0000 mg | Freq: Four times a day (QID) | RECTAL | Status: DC | PRN
Start: 2014-04-17 — End: 2014-04-20

## 2014-04-17 MED ORDER — MEPERIDINE HCL 25 MG/ML IJ SOLN: 6.2500 mg | INTRAMUSCULAR | Status: DC | PRN

## 2014-04-17 MED ORDER — FENTANYL CITRATE 0.05 MG/ML IJ SOLN
INTRAMUSCULAR | Status: AC
  Filled 2014-04-17: qty 5

## 2014-04-17 MED ORDER — OXYCODONE HCL 5 MG PO TABS
5.0000 mg | ORAL_TABLET | ORAL | Status: DC | PRN
  Filled 2014-04-17: qty 2

## 2014-04-17 MED ORDER — METOCLOPRAMIDE HCL 5 MG/ML IJ SOLN: 5.0000 mg | Freq: Three times a day (TID) | INTRAMUSCULAR | Status: DC | PRN

## 2014-04-17 MED ORDER — MIDAZOLAM HCL 2 MG/2ML IJ SOLN
INTRAMUSCULAR | Status: AC
  Filled 2014-04-17: qty 2

## 2014-04-17 MED ORDER — ONDANSETRON HCL 4 MG/2ML IJ SOLN: 4.0000 mg | Freq: Four times a day (QID) | INTRAMUSCULAR | Status: DC | PRN

## 2014-04-17 MED ORDER — DEXAMETHASONE SODIUM PHOSPHATE 10 MG/ML IJ SOLN
INTRAMUSCULAR | Status: DC | PRN
  Administered 2014-04-17: 10 mg via INTRAVENOUS

## 2014-04-17 MED ORDER — NEOSTIGMINE METHYLSULFATE 10 MG/10ML IV SOLN
INTRAVENOUS | Status: AC
  Filled 2014-04-17: qty 1

## 2014-04-17 MED ORDER — MAGNESIUM CITRATE PO SOLN: 1.0000 | Freq: Once | ORAL | Status: AC | PRN

## 2014-04-17 MED ORDER — FENTANYL CITRATE 0.05 MG/ML IJ SOLN
25.0000 ug | INTRAMUSCULAR | Status: DC | PRN
  Administered 2014-04-17 (×2): 25 ug via INTRAVENOUS
  Administered 2014-04-17: 50 ug via INTRAVENOUS
  Administered 2014-04-17: 25 ug via INTRAVENOUS

## 2014-04-17 MED ORDER — CEFUROXIME SODIUM 1.5 G IJ SOLR
INTRAMUSCULAR | Status: AC
  Filled 2014-04-17: qty 1.5

## 2014-04-17 MED ORDER — ACETAMINOPHEN 10 MG/ML IV SOLN: 1000.0000 mg | INTRAVENOUS | Status: DC

## 2014-04-17 MED ORDER — OXYCODONE-ACETAMINOPHEN 5-325 MG PO TABS: 1.0000 | ORAL_TABLET | ORAL | Status: DC | PRN

## 2014-04-17 MED ORDER — BISACODYL 5 MG PO TBEC: 5.0000 mg | DELAYED_RELEASE_TABLET | Freq: Every day | ORAL | Status: DC | PRN

## 2014-04-17 MED ORDER — ASPIRIN EC 325 MG PO TBEC: 325.0000 mg | DELAYED_RELEASE_TABLET | Freq: Two times a day (BID) | ORAL | Status: DC

## 2014-04-17 MED ORDER — DEXTROSE 5 % IV SOLN
INTRAVENOUS | Status: DC | PRN
  Administered 2014-04-17: 08:00:00 via INTRAVENOUS

## 2014-04-17 MED ORDER — PROPOFOL 10 MG/ML IV BOLUS
INTRAVENOUS | Status: DC | PRN
  Administered 2014-04-17: 140 mg via INTRAVENOUS

## 2014-04-17 SURGICAL SUPPLY — 77 items
ARTICULEZE HEAD 36 12 (Hips) ×2 IMPLANT
BLADE SAW SAG 73X25 THK (BLADE) ×1
BLADE SAW SGTL 73X25 THK (BLADE) ×1 IMPLANT
BOWL SMART MIX CTS (DISPOSABLE) IMPLANT
BRUSH FEMORAL CANAL (MISCELLANEOUS) IMPLANT
COVER SURGICAL LIGHT HANDLE (MISCELLANEOUS) ×2 IMPLANT
CUP ACETAB 58MM ×2 IMPLANT
DRAPE C-ARM 42X72 X-RAY (DRAPES) IMPLANT
DRAPE IMP U-DRAPE 54X76 (DRAPES) ×2 IMPLANT
DRAPE ORTHO SPLIT 77X108 STRL (DRAPES) ×1
DRAPE PROXIMA HALF (DRAPES) ×2 IMPLANT
DRAPE SURG ORHT 6 SPLT 77X108 (DRAPES) ×1 IMPLANT
DRAPE U-SHAPE 47X51 STRL (DRAPES) ×2 IMPLANT
DRILL BIT 7/64X5 (BIT) ×2 IMPLANT
DRSG AQUACEL AG ADV 3.5X10 (GAUZE/BANDAGES/DRESSINGS) ×2 IMPLANT
DURAPREP 26ML APPLICATOR (WOUND CARE) ×2 IMPLANT
ELECT BLADE 4.0 EZ CLEAN MEGAD (MISCELLANEOUS) ×2
ELECT BLADE 6.5 EXT (BLADE) IMPLANT
ELECT REM PT RETURN 9FT ADLT (ELECTROSURGICAL) ×2
ELECTRODE BLDE 4.0 EZ CLN MEGD (MISCELLANEOUS) ×1 IMPLANT
ELECTRODE REM PT RTRN 9FT ADLT (ELECTROSURGICAL) ×1 IMPLANT
EVACUATOR 1/8 PVC DRAIN (DRAIN) IMPLANT
GAUZE SPONGE 4X4 12PLY STRL (GAUZE/BANDAGES/DRESSINGS) IMPLANT
GAUZE XEROFORM 5X9 LF (GAUZE/BANDAGES/DRESSINGS) IMPLANT
GLOVE BIO SURGEON STRL SZ7.5 (GLOVE) ×2 IMPLANT
GLOVE BIO SURGEON STRL SZ8.5 (GLOVE) ×2 IMPLANT
GLOVE BIO SURGEONS STER SZ 5.5 (GLOVE) ×2 IMPLANT
GLOVE BIOGEL PI IND STRL 6.5 (GLOVE) ×1 IMPLANT
GLOVE BIOGEL PI IND STRL 8 (GLOVE) ×1 IMPLANT
GLOVE BIOGEL PI IND STRL 9 (GLOVE) ×1 IMPLANT
GLOVE BIOGEL PI INDICATOR 6.5 (GLOVE) ×1
GLOVE BIOGEL PI INDICATOR 8 (GLOVE) ×1
GLOVE BIOGEL PI INDICATOR 9 (GLOVE) ×1
GOWN STRL REUS W/ TWL LRG LVL3 (GOWN DISPOSABLE) ×1 IMPLANT
GOWN STRL REUS W/ TWL XL LVL3 (GOWN DISPOSABLE) ×2 IMPLANT
GOWN STRL REUS W/TWL LRG LVL3 (GOWN DISPOSABLE) ×1
GOWN STRL REUS W/TWL XL LVL3 (GOWN DISPOSABLE) ×2
HANDPIECE INTERPULSE COAX TIP (DISPOSABLE)
HEAD ARTICULEZE 36 12 (Hips) ×1 IMPLANT
HEEL PROTECTOR  874200 (MISCELLANEOUS)
HEEL PROTECTOR 874200 (MISCELLANEOUS) IMPLANT
HOOD PEEL AWAY FACE SHEILD DIS (HOOD) ×4 IMPLANT
KIT BASIN OR (CUSTOM PROCEDURE TRAY) ×2 IMPLANT
KIT ROOM TURNOVER OR (KITS) ×2 IMPLANT
LINER MARATHON 10D 36M (Hips) ×1 IMPLANT
LINER MARATHON 10DEG 36M (Hips) ×1 IMPLANT
MANIFOLD NEPTUNE II (INSTRUMENTS) ×2 IMPLANT
NEEDLE 22X1 1/2 (OR ONLY) (NEEDLE) ×2 IMPLANT
NOZZLE PRISM 8.5MM (MISCELLANEOUS) IMPLANT
NS IRRIG 1000ML POUR BTL (IV SOLUTION) ×2 IMPLANT
PACK TOTAL JOINT (CUSTOM PROCEDURE TRAY) ×2 IMPLANT
PACK UNIVERSAL I (CUSTOM PROCEDURE TRAY) ×2 IMPLANT
PAD ARMBOARD 7.5X6 YLW CONV (MISCELLANEOUS) ×4 IMPLANT
PASSER SUT SWANSON 36MM LOOP (INSTRUMENTS) ×2 IMPLANT
PILLOW ABDUCTION HIP (SOFTGOODS) ×2 IMPLANT
PRESSURIZER FEMORAL UNIV (MISCELLANEOUS) IMPLANT
SCREW 6.5MMX25MM (Screw) ×2 IMPLANT
SCREW PINN CAN 6.5X20 (Screw) ×2 IMPLANT
SET HNDPC FAN SPRY TIP SCT (DISPOSABLE) IMPLANT
SLEEVE SURGEON STRL (DRAPES) IMPLANT
SPONGE LAP 18X18 X RAY DECT (DISPOSABLE) IMPLANT
STAPLER VISISTAT 35W (STAPLE) ×2 IMPLANT
SUT ETHIBOND 2 V 37 (SUTURE) ×2 IMPLANT
SUT VIC AB 0 CTB1 27 (SUTURE) ×2 IMPLANT
SUT VIC AB 1 CTX 36 (SUTURE) ×1
SUT VIC AB 1 CTX36XBRD ANBCTR (SUTURE) ×1 IMPLANT
SUT VIC AB 2-0 CTB1 (SUTURE) ×2 IMPLANT
SUT VIC AB 3-0 CT1 27 (SUTURE) ×1
SUT VIC AB 3-0 CT1 TAPERPNT 27 (SUTURE) ×1 IMPLANT
SYR 20ML ECCENTRIC (SYRINGE) ×2 IMPLANT
SYR CONTROL 10ML LL (SYRINGE) ×2 IMPLANT
TOWEL OR 17X24 6PK STRL BLUE (TOWEL DISPOSABLE) ×2 IMPLANT
TOWEL OR 17X26 10 PK STRL BLUE (TOWEL DISPOSABLE) ×2 IMPLANT
TOWER CARTRIDGE SMART MIX (DISPOSABLE) IMPLANT
TRAY FOLEY CATH 14FR (SET/KITS/TRAYS/PACK) IMPLANT
TUBE ANAEROBIC SPECIMEN COL (MISCELLANEOUS) IMPLANT
WATER STERILE IRR 1000ML POUR (IV SOLUTION) IMPLANT

## 2014-04-17 NOTE — Op Note (Addendum)
Preop diagnosis: Painful Left DePuy Pinnacle total hip  Postoperative diagnosis: Same  Procedure: Revision left total hip arthroplasty with removal of pinnacle cup and femoral head and revision to a 58 mm multi hole Gryption cup 10 polyethylene liner index superior and a +12 36 mm metal head.  Surgeon: Feliberto GottronFrank J. Turner Danielsowan M.D.  Assistant: Tomi LikensEric K. Gaylene BrooksPhillips PA-C  (present throughout entire procedure and necessary for timely completion of the procedure)  Estimated blood loss: 400 cc  Fluid replacement: 2000 cc of crystalloid  Complications: None  Indications: Patient with an Pinnacle total hip that did very well until a few months ago when she had increasing groin pain, XR cup Abd 80deg, 50deg anteversion. The pain wakes her up at night and recently got to the point where he could no longer go walking on a regular basis, hip was subluxing ant. Plain x-rays show no change in the position of the components and the stem appears to be well ingrown. Risks and benefits of revision surgery have been discussed and questions answered.  Procedure: Patient was identified by arm band receive preoperative IV antibiotics in the holding area at, and hospital. She was then taken to the operating room where the appropriate anesthetic monitors were attached and general endotracheal anesthesia induced with the patient in the supine position. She was then rolled into the R lateral decubitus position and fixed there with a mark 2 pelvic clamp. A Foley catheter was inserted and the limb prepped and draped in usual sterile fashion from the ankle to the hemipelvis. Time out procedure performed. Skin along the lateral hip and thigh infiltrated with 10 cc of 1/2% Marcaine and epinephrine solution. We began the operation by recreating the old posterior lateral incision 20 cm in line through the skin and subcutaneous tissue down to the level of the IT band which was cut in line with the skin incision. This exposed the Greater  trochanter. We then removed scar tissue from around to the Pinnacle cup and femoral stem trunnion, dislocated a total hip and removed the +17 fem head with a mallet and metal cylinder. The trunnion was then tucked anterior and superior to the acetabulum and we continued to remove scar tissue from around the acetabular component. A posterior inferior wing retractor was hammered into place. And we began loosening the cup by placing a 1/4 inch osteotome between the edge of the acetabular component and the bone. We then used the short Innomed curved osteotome around the edge of the cup and at that point it came out eith effort indicating good ingrowth. Fibrous tissue is then stripped from the acetabulum. We reamed up to a 58 mm basket reamer obtaining good coverage in all quadrants irrigated with normal saline solution. We then hammered into place a 59 mm multi hole Gryption cup in 45 of abduction and 20 of anteversion. 2 Dome screws were placed . A 10 polyethylene liner index superior. A trial reduction was then performed with a +12 36 mm femoral head instability was noted to 90 of flexion 70 of internal rotation and in full extension the hip could not be dislocated with external rotation. At this point a real +12 36 mm metal head was hammered into place, the hip reduced and irrigated with normal saline solution. The capsular flap was repaired back to the intertrochanteric crest through drill holes with #2 Ethibond suture. We then closed the IT band with running #1 Vicryl suture, the subcutaneous tissue with 0 and 2-0 undyed Vicryl suture, the skin  was closed with running interlocking 3-0 nylon suture. A dressing of Mepilex was then applied, the patient was unclamped a rolled supine awakened extubated and taken to the recovery without difficulty.

## 2014-04-17 NOTE — Interval H&P Note (Signed)
History and Physical Interval Note:  04/17/2014 7:01 AM  Kristy Mejia  has presented today for surgery, with the diagnosis of loose left total hip polywear  The various methods of treatment have been discussed with the patient and family. After consideration of risks, benefits and other options for treatment, the patient has consented to  Procedure(s): LEFT TOTAL HIP REVISION (Left) as a surgical intervention .  The patient's history has been reviewed, patient examined, no change in status, stable for surgery.  I have reviewed the patient's chart and labs.  Questions were answered to the patient's satisfaction.     Nestor LewandowskyOWAN,Kian Ottaviano J

## 2014-04-17 NOTE — Anesthesia Postprocedure Evaluation (Signed)
  Anesthesia Post-op Note  Patient: Kristy Mejia  Procedure(s) Performed: Procedure(s): LEFT TOTAL HIP REVISION (Left)  Patient Location: PACU  Anesthesia Type:General  Level of Consciousness: awake and alert   Airway and Oxygen Therapy: Patient Spontanous Breathing and Patient connected to nasal cannula oxygen  Post-op Pain: mild  Post-op Assessment: Post-op Vital signs reviewed, Patient's Cardiovascular Status Stable, Respiratory Function Stable, Patent Airway and No signs of Nausea or vomiting  Post-op Vital Signs: Reviewed  Last Vitals:  Filed Vitals:   04/17/14 1025  BP: 114/50  Pulse: 77  Temp:   Resp: 18    Complications: No apparent anesthesia complications

## 2014-04-17 NOTE — Progress Notes (Signed)
Utilization review completed.  

## 2014-04-17 NOTE — Evaluation (Signed)
Physical Therapy Evaluation Patient Details Name: Kristy LagerJinx S Mejia MRN: [id number] DOB: [birth year] Today's Date: 04/17/2014   History of Present Illness  Admitted for L THA; Posterior Prec; WBAT  Past Medical History  Diagnosis Date  . Obesity, unspecified   . Arthritis   . Pneumonia 2010    hx of  . Joint pain   . Back pain     scoliosis   Past Surgical History  Procedure Laterality Date  . Partial hysterectomy  1990  . Total hip arthroplasty  2006  . Other surgical history  2004    Bilateral cataracts  . Back surgery  1960's    d/t scoliosis-1964/65?  Marland Kitchen. Foot surgery  1960's  . Colonoscopy  2007    Dr. Mechele CollinElliott  . Eye surgery  2007    cataracts  . Breast surgery  1990    excision breast mass  . Colonoscopy with esophagogastroduodenoscopy (egd) and esophageal dilation (ed)       Clinical Impression  Pt is s/p THA resulting in the deficits listed below (see PT Problem List).  Pt will benefit from skilled PT to increase their independence and safety with mobility to allow discharge to the venue listed below.        Follow Up Recommendations Home health PT;Supervision/Assistance - 24 hour    Equipment Recommendations  Rolling walker with 5" wheels;3in1 (PT)    Recommendations for Other Services OT consult     Precautions / Restrictions Precautions Precautions: Posterior Hip Precaution Booklet Issued: Yes (comment) Precaution Comments: Educated pt in post hip prec  Restrictions Weight Bearing Restrictions: Yes LLE Weight Bearing: Weight bearing as tolerated      Mobility  Bed Mobility Overal bed mobility: Needs Assistance Bed Mobility: Supine to Sit     Supine to sit: Mod assist     General bed mobility comments: Cues for technique; Required mod assist using bed pad to initially move hips towards EOB, then to square of hips at EOB  Transfers Overall transfer level: Needs assistance Equipment used: Rolling walker (2 wheeled) Transfers: Sit to/from  Stand Sit to Stand: Min assist         General transfer comment: Cues for post prec, hand placement, and technique  Ambulation/Gait Ambulation/Gait assistance: Min guard Ambulation Distance (Feet): 3 Feet Assistive device: Rolling walker (2 wheeled) Gait Pattern/deviations: Step-to pattern     General Gait Details: Cues for gait sequence  Stairs            Wheelchair Mobility    Modified Rankin (Stroke Patients Only)       Balance                                             Pertinent Vitals/Pain Pain Assessment: 0-10 Pain Score: 5  Pain Location: L hip and some back spasms Pain Descriptors / Indicators: Aching;Spasm Pain Intervention(s): Limited activity within patient's tolerance;Monitored during session;Repositioned    Home Living Family/patient expects to be discharged to:: Private residence Living Arrangements: Spouse/significant other Available Help at Discharge: Family;Available 24 hours/day Type of Home: House Home Access: Stairs to enter Entrance Stairs-Rails: None (but steps are wide/deep enough for a RW to fit on each step) Entrance Stairs-Number of Steps: 3 Home Layout: One level Home Equipment: None      Prior Function Level of Independence: Independent  Hand Dominance        Extremity/Trunk Assessment   Upper Extremity Assessment: Overall WFL for tasks assessed (Noted R hand swelling/?IV problem)           Lower Extremity Assessment: LLE deficits/detail   LLE Deficits / Details: Grossly decr AROM and strength, limited by pain postop     Communication   Communication: No difficulties  Cognition Arousal/Alertness: Awake/alert Behavior During Therapy: WFL for tasks assessed/performed Overall Cognitive Status: Within Functional Limits for tasks assessed                      General Comments      Exercises Total Joint Exercises Ankle Circles/Pumps: AROM;Both;10 reps Quad Sets:  AROM;Left;5 reps Heel Slides: AAROM;Left;5 reps Hip ABduction/ADduction: AAROM;Left;5 reps      Assessment/Plan    PT Assessment Patient needs continued PT services  PT Diagnosis Difficulty walking;Acute pain   PT Problem List Decreased strength;Decreased range of motion;Decreased activity tolerance;Decreased balance;Decreased mobility;Decreased knowledge of use of DME;Decreased knowledge of precautions;Pain  PT Treatment Interventions DME instruction;Gait training;Stair training;Functional mobility training;Therapeutic activities;Therapeutic exercise;Patient/family education   PT Goals (Current goals can be found in the Care Plan section) Acute Rehab PT Goals Patient Stated Goal: home PT Goal Formulation: With patient Time For Goal Achievement: 04/24/14 Potential to Achieve Goals: Good    Frequency 7X/week   Barriers to discharge        Co-evaluation               End of Session Equipment Utilized During Treatment: Gait belt Activity Tolerance: Patient tolerated treatment well Patient left: in chair;with call bell/phone within reach Nurse Communication: Mobility status         Time: 1531-1600 PT Time Calculation (min) (ACUTE ONLY): 29 min   Charges:   PT Evaluation $Initial PT Evaluation Tier I: 1 Procedure PT Treatments $Gait Training: 8-22 mins $Therapeutic Activity: 8-22 mins   PT G Codes:          Olen PelGarrigan, Dawsen Krieger Hamff 04/17/2014, 4:59 PM  Van ClinesHolly Laekyn Rayos, [place]PT  Acute Rehabilitation Services Pager [phone] Office [phone]

## 2014-04-17 NOTE — Transfer of Care (Signed)
Immediate Anesthesia Transfer of Care Note  Patient: Kristy Mejia  Procedure(s) Performed: Procedure(s): LEFT TOTAL HIP REVISION (Left)  Patient Location: PACU  Anesthesia Type:General  Level of Consciousness: awake and patient cooperative  Airway & Oxygen Therapy: Patient Spontanous Breathing and Patient connected to nasal cannula oxygen  Post-op Assessment: Report given to PACU RN, Post -op Vital signs reviewed and stable and Patient moving all extremities  Post vital signs: Reviewed and stable  Complications: No apparent anesthesia complications

## 2014-04-17 NOTE — Anesthesia Preprocedure Evaluation (Addendum)
Anesthesia Evaluation  Patient identified by MRN, date of birth, ID band Patient awake    Reviewed: Allergy & Precautions, H&P , NPO status , Patient's Chart, lab work & pertinent test results, reviewed documented beta blocker date and time   Airway Mallampati: II  TM Distance: >3 FB Neck ROM: Full    Dental  (+) Teeth Intact, Dental Advisory Given   Pulmonary  breath sounds clear to auscultation        Cardiovascular Rhythm:Regular  Palpitations 2013 W/U by cardiology,Holter and ECHO both not remarkable, normal LV function   Neuro/Psych    GI/Hepatic   Endo/Other    Renal/GU      Musculoskeletal   Abdominal (+)  Abdomen: soft.    Peds  Hematology   Anesthesia Other Findings Teeth irregular alignment; slightly receding chin  Reproductive/Obstetrics                           Anesthesia Physical Anesthesia Plan  ASA: II  Anesthesia Plan: General   Post-op Pain Management:    Induction: Intravenous  Airway Management Planned: Oral ETT  Additional Equipment:   Intra-op Plan:   Post-operative Plan: Extubation in OR  Informed Consent: I have reviewed the patients History and Physical, chart, labs and discussed the procedure including the risks, benefits and alternatives for the proposed anesthesia with the patient or authorized representative who has indicated his/her understanding and acceptance.     Plan Discussed with:   Anesthesia Plan Comments:         Anesthesia Quick Evaluation

## 2014-04-17 NOTE — Anesthesia Procedure Notes (Signed)
Procedure Name: Intubation Date/Time: 04/17/2014 7:37 AM Performed by: Marni GriffonJAMES, Kristy Ron B Pre-anesthesia Checklist: Patient identified, Emergency Drugs available, Suction available and Patient being monitored Patient Re-evaluated:Patient Re-evaluated prior to inductionOxygen Delivery Method: Circle system utilized Preoxygenation: Pre-oxygenation with 100% oxygen Intubation Type: IV induction Ventilation: Mask ventilation without difficulty Laryngoscope Size: Mac and 3 Grade View: Grade III Tube type: Oral Tube size: 7.5 mm Number of attempts: 2 (KBJ laryngoscopy able to see only base of cords; Dr. Berneice HeinrichManny completed intubation) Airway Equipment and Method: Stylet Placement Confirmation: breath sounds checked- equal and bilateral and positive ETCO2 Secured at: 21 (cm at teeth) cm Tube secured with: Tape Dental Injury: Teeth and Oropharynx as per pre-operative assessment

## 2014-04-17 NOTE — Discharge Instructions (Signed)
Total Hip Replacement, Care After °Refer to this sheet in the next few weeks. These instructions provide you with information on caring for yourself after your procedure. Your health care provider may also give you specific instructions. Your treatment has been planned according to the most current medical practices, but problems sometimes occur. Call your health care provider if you have any problems or questions after your procedure. °HOME CARE INSTRUCTIONS  °Your health care provider will give you specific precautions for certain types of movement. Additional instructions include: °· Take medicines only as directed by your health care provider. °· Take quick showers (3-5 min) rather than bathe until your health care provider tells you that you can take baths again. °· Avoid lifting until your health care provider instructs you otherwise. °· Use a raised toilet seat and avoid sitting in low chairs as instructed by your health care provider. °· Use crutches or a walker as instructed by your health care provider. °SEEK MEDICAL CARE IF: °· You have difficulty breathing. °· You have drainage, redness, or swelling at your incision site. °· You have a bad smell coming from your incision site. °· You have persistent bleeding from your incision site. °· Your incision breaks open after sutures (stitches) or staples have been removed. °· You have a fever. °SEEK IMMEDIATE MEDICAL CARE IF:  °· You have a rash. °· You have pain or swelling in your calf or thigh. °· You have shortness of breath or chest pain. °MAKE SURE YOU: °· Understand these instructions. °· Will watch your condition. °· Will get help if you are not doing well or get worse. °Document Released: 11/22/2004 Document Revised: 09/19/2013 Document Reviewed: 07/06/2013 °[employer]® Patient Information ©2015 [employer]. This information is not intended to replace advice given to you by your health care provider. Make sure you discuss any questions you have with  your health care provider. ° °

## 2014-04-18 ENCOUNTER — Encounter (HOSPITAL_COMMUNITY): Payer: Self-pay | Admitting: General Practice

## 2014-04-18 LAB — BASIC METABOLIC PANEL
Anion gap: 10 (ref 5–15)
BUN: 9 mg/dL (ref 6–23)
CO2: 27 mEq/L (ref 19–32)
Calcium: 8.8 mg/dL (ref 8.4–10.5)
Chloride: 106 mEq/L (ref 96–112)
Creatinine, Ser: 0.79 mg/dL (ref 0.50–1.10)
GFR calc non Af Amer: 85 mL/min — ABNORMAL LOW (ref >90)
Glucose, Bld: 143 mg/dL — ABNORMAL HIGH (ref 70–99)
POTASSIUM: 4.4 meq/L (ref 3.7–5.3)
Sodium: 143 mEq/L (ref 137–147)

## 2014-04-18 LAB — CBC
HEMATOCRIT: 37.5 % (ref 36.0–46.0)
Hemoglobin: 12.2 g/dL (ref 12.0–15.0)
MCH: 30.1 pg (ref 26.0–34.0)
MCHC: 32.5 g/dL (ref 30.0–36.0)
MCV: 92.6 fL (ref 78.0–100.0)
Platelets: 210 10*3/uL (ref 150–400)
RBC: 4.05 MIL/uL (ref 3.87–5.11)
RDW: 13.6 % (ref 11.5–15.5)
WBC: 8.9 10*3/uL (ref 4.0–10.5)

## 2014-04-18 NOTE — Clinical Social Work Note (Signed)
Referred to CSW today for ?SNF. Chart reviewed and noted [facility] CSW to sign off- please contact us if SW needs arise. Reece LevyJanet Tamar Lipscomb, MSW, Theresia MajorsLCSWA [phone]

## 2014-04-18 NOTE — Evaluation (Addendum)
Occupational Therapy Evaluation Patient Details Name: Kristy LagerJinx S Trevizo MRN: [id number] DOB: [birth year] Today's Date: 04/18/2014    History of Present Illness Admitted for L THA; Posterior Prec; WBAT   Clinical Impression   Pt s/p above. Pt independent with ADLs, PTA. Feel pt will benefit from acute OT to increase independence with BADLs, PTA.    Follow Up Recommendations  No OT follow up;Supervision - Intermittent    Equipment Recommendations  3 in 1 bedside comode;Other (comment);Tub/shower bench-pt unsure if she has tub bench (AE)    Recommendations for Other Services       Precautions / Restrictions Precautions Precautions: Posterior Hip Precaution Comments: reviewed hip precautions Restrictions Weight Bearing Restrictions: Yes LLE Weight Bearing: Weight bearing as tolerated      Mobility Bed Mobility    General bed mobility comments: not assessed  Transfers Overall transfer level: Needs assistance Equipment used: Rolling walker (2 wheeled) Transfers: Sit to/from Stand Sit to Stand: Min assist;Mod assist         General transfer comment: cues for technique.     Balance                                            ADL Overall ADL's : Needs assistance/impaired Eating/Feeding: Independent;Sitting                   Lower Body Dressing: Minimal assistance;With adaptive equipment;Sit to/from stand   Toilet Transfer: Minimal assistance;Ambulation;RW;BSC   Toileting- Clothing Manipulation and Hygiene: Moderate assistance;Sit to/from stand       Functional mobility during ADLs: Minimal assistance;Rolling walker General ADL Comments: Educated on AE and where to purchase/cost. Pt practiced with reacher and sockaid with donning/doffing sock. Educated on safety (use of bag on walker, safe shoewear, rugs, sitting for most of LB ADLs). Recommended someone be with her for tub transfer. Discussed 3 in 1 and also tub transfer techniques.  Elevated  pt's legs at end of session and also advised her to keep pillow between legs. Educated on what pt could use for toilet aide to assist with hygiene. Educated on LB dressing technique.     Vision                     Perception     Praxis      Pertinent Vitals/Pain Pain Assessment: 0-10 Pain Score: 5  Faces Pain Scale: Hurts little more Pain Location: LLE and reports back spasms Pain Descriptors / Indicators: Spasm (in back) Pain Intervention(s): Monitored during session;Repositioned     Hand Dominance     Extremity/Trunk Assessment Upper Extremity Assessment Upper Extremity Assessment: Overall WFL for tasks assessed   Lower Extremity Assessment Lower Extremity Assessment: Defer to PT evaluation       Communication Communication Communication: No difficulties   Cognition Arousal/Alertness: Awake/alert Behavior During Therapy: WFL for tasks assessed/performed Overall Cognitive Status: Within Functional Limits for tasks assessed                     General Comments          Shoulder Instructions      Home Living Family/patient expects to be discharged to:: Private residence Living Arrangements: Spouse/significant other;Parent Available Help at Discharge: Family;Available 24 hours/day Type of Home: House Home Access: Stairs to enter Entergy CorporationEntrance Stairs-Number of Steps: 3 Entrance Stairs-Rails: None (but steps are wide/deep  enough for a RW to fit on each step) Home Layout: One level     Bathroom Shower/Tub: Chief Strategy OfficerTub/shower unit   Bathroom Toilet: Standard     Home Equipment: Shower seat;Adaptive equipment (may have a tub bench-unsure) Adaptive Equipment: Reacher        Prior Functioning/Environment Level of Independence: Independent             OT Diagnosis: Acute pain   OT Problem List: Decreased strength;Decreased range of motion;Impaired balance (sitting and/or standing);Decreased activity tolerance;Pain;Decreased knowledge of  precautions;Decreased knowledge of use of DME or AE   OT Treatment/Interventions: Self-care/ADL training;DME and/or AE instruction;Patient/family education;Balance training;Therapeutic activities    OT Goals(Current goals can be found in the care plan section) Acute Rehab OT Goals Patient Stated Goal: not stated OT Goal Formulation: With patient Time For Goal Achievement: 04/25/14 Potential to Achieve Goals: Good ADL Goals Pt Will Perform Lower Body Dressing: with modified independence;with adaptive equipment;sit to/from stand Pt Will Transfer to Toilet: with modified independence;ambulating (3 in 1 over commode) Pt Will Perform Toileting - Clothing Manipulation and hygiene: with modified independence;with adaptive equipment;sit to/from stand Pt Will Perform Tub/Shower Transfer: Tub transfer;with supervision;ambulating;tub bench;rolling walker  OT Frequency: Min 2X/week   Barriers to D/C:            Co-evaluation              End of Session Equipment Utilized During Treatment: Gait belt;Rolling walker  Activity Tolerance: Patient tolerated treatment well Patient left: in chair;with call bell/phone within reach   Time: 1153-1228 OT Time Calculation (min): 35 min (increase time trying to urinate) Charges:  OT General Charges $OT Visit: 1 Procedure OT Evaluation $Initial OT Evaluation Tier I: 1 Procedure OT Treatments $Self Care/Home Management : 8-22 mins G-CodesEarlie Raveling:    Viha Kriegel L OTR/L [phone] 04/18/2014, 12:56 PM

## 2014-04-18 NOTE — Progress Notes (Signed)
Physical Therapy Treatment Patient Details Name: Kristy Mejia MRN: [id number] DOB: 19-Oct-1945 Today's Date: 04/18/2014    History of Present Illness Admitted for L THA; Posterior Prec; WBAT    PT Comments    Making slow, but steady progress; Unfortunately pt is experiencing back spasms which are limiting mobility more than the Hip; Plan to practice steps and progress ambulation this afternoon  Follow Up Recommendations  Home health PT;Supervision/Assistance - 24 hour     Equipment Recommendations  Rolling walker with 5" wheels;3in1 (PT)    Recommendations for Other Services       Precautions / Restrictions Precautions Precautions: Posterior Hip Precaution Comments: Educated pt in post hip prec  Restrictions Weight Bearing Restrictions: Yes LLE Weight Bearing: Weight bearing as tolerated    Mobility  Bed Mobility Overal bed mobility: Needs Assistance Bed Mobility: Supine to Sit     Supine to sit: Mod assist     General bed mobility comments: Cues for technique; Required mod assist using bed pad to initially move hips towards EOB, then to square of hips at EOB  Transfers Overall transfer level: Needs assistance Equipment used: Rolling walker (2 wheeled) Transfers: Sit to/from Stand Sit to Stand: Min assist         General transfer comment: Cues for post prec, hand placement, and technique  Ambulation/Gait Ambulation/Gait assistance: Min guard Ambulation Distance (Feet): 30 Feet Assistive device: Rolling walker (2 wheeled) Gait Pattern/deviations: Step-to pattern;Trunk flexed Gait velocity: extremely slow   General Gait Details: Cues for gait sequence and to self-monitor for activity tolerance   Stairs            Wheelchair Mobility    Modified Rankin (Stroke Patients Only)       Balance                                    Cognition Arousal/Alertness: Awake/alert Behavior During Therapy: WFL for tasks  assessed/performed Overall Cognitive Status: Within Functional Limits for tasks assessed                      Exercises Total Joint Exercises Ankle Circles/Pumps: AROM;Both;10 reps Quad Sets: AROM;Left;10 reps Gluteal Sets: AROM;Both;10 reps Towel Squeeze: AROM;Left;10 reps Heel Slides: AAROM;Left;10 reps Hip ABduction/ADduction: AAROM;Left;10 reps    General Comments        Pertinent Vitals/Pain Pain Assessment: Faces Faces Pain Scale: Hurts little more Pain Location: L hip and back spasms with getting from supine to sit Pain Descriptors / Indicators: Aching;Spasm Pain Intervention(s): Limited activity within patient's tolerance;Monitored during session;Repositioned    Home Living Family/patient expects to be discharged to:: Private residence Living Arrangements: Spouse/significant other;Parent                  Prior Function            PT Goals (current goals can now be found in the care plan section) Acute Rehab PT Goals PT Goal Formulation: With patient Time For Goal Achievement: 04/24/14 Potential to Achieve Goals: Good Progress towards PT goals: Progressing toward goals    Frequency  7X/week    PT Plan Current plan remains appropriate    Co-evaluation             End of Session Equipment Utilized During Treatment: Gait belt Activity Tolerance: Patient tolerated treatment well Patient left: in chair;with call bell/phone within reach     Time: [phone]  PT Time Calculation (min) (ACUTE ONLY): 38 min  Charges:  $Gait Training: 8-22 mins $Therapeutic Exercise: 8-22 mins $Therapeutic Activity: 8-22 mins                    G Codes:      Van ClinesGarrigan, Kristy Mejia 04/18/2014, 12:24 PM  Van ClinesHolly Huyen Mejia, [place]  Acute Rehabilitation Services Pager [phone] Office [phone]

## 2014-04-18 NOTE — Progress Notes (Signed)
Patient ID: Kristy Mejia, female   DOB: [birth year], 68 y.o.   MRN: [id number] PATIENT ID: Kristy Mejia  MRN: [id number]  DOB/AGE:  [birth year] / 68 y.o.  1 Day Post-Op Procedure(s) (LRB): LEFT TOTAL HIP REVISION (Left)    PROGRESS NOTE Subjective: Patient is alert, oriented, x1 Nausea, no Vomiting, yes passing gas, no Bowel Movement. Taking PO well. Denies SOB, Chest or Calf Pain. Using Incentive Spirometer, PAS in place. Ambulate in room Patient reports pain as 1 on 0-10 scale  .    Objective: Vital signs in last 24 hours: Filed Vitals:   04/18/14 0000 04/18/14 0156 04/18/14 0400 04/18/14 0425  BP:  116/62  77/47  Pulse:  79  76  Temp:  98.6 F (37 C)  99 F (37.2 C)  TempSrc:      Resp: 17 17 18 17   Height:      Weight:      SpO2:  100%  100%      Intake/Output from previous day: I/O last 3 completed shifts: In: 1950 [I.V.:1950] Out: 150 [Blood:150]   Intake/Output this shift: Total I/O In: 120 [P.O.:120] Out: -    LABORATORY DATA:  Recent Labs  04/18/14 0528  WBC 8.9  HGB 12.2  HCT 37.5  PLT 210  NA 143  K 4.4  CL 106  CO2 27  BUN 9  CREATININE 0.79  GLUCOSE 143*  CALCIUM 8.8    Examination: Neurologically intact ABD soft Neurovascular intact Sensation intact distally Intact pulses distally Dorsiflexion/Plantar flexion intact Incision: no drainage No cellulitis present Compartment soft} XR AP&Lat of hip shows well placed\fixed rev LTHA  Assessment:   1 Day Post-Op Procedure(s) (LRB): LEFT TOTAL HIP REVISION (Left) ADDITIONAL DIAGNOSIS:  Expected Acute Blood Loss Anemia,   Plan: PT/OT WBAT, THA  posterior precautions  DVT Prophylaxis: SCDx72 hrs, ASA 325 mg BID x 2 weeks  DISCHARGE PLAN: Home  DISCHARGE NEEDS: HHPT, CPM, Walker and 3-in-1 comode seat

## 2014-04-18 NOTE — Progress Notes (Signed)
Physical Therapy Treatment Patient Details Name: Kristy LagerJinx S Mejia MRN: [id number] DOB: April 02, 1946 Today's Date: 04/18/2014    History of Present Illness Admitted for L THA; Posterior Prec; WBAT    PT Comments    Making progress with mobility, though slow; Stair training complete; the most difficult aspect of her mobility is getting in and out of bed; we discussed having ehr husband help her, and plan on making that the focus of tomorrow's am session; It is worth finding out if a hospital bed would be covered by insurance (I told pt I'm not sure that it would be)    Follow Up Recommendations  Home health PT;Supervision/Assistance - 24 hour     Equipment Recommendations  Rolling walker with 5" wheels;3in1 (PT) (youth-sized RW; would a hospital bed be covered by insurance?)    Recommendations for Other Services       Precautions / Restrictions Precautions Precautions: Posterior Hip Precaution Comments: Educated pt in post hip prec  Restrictions Weight Bearing Restrictions: Yes LLE Weight Bearing: Weight bearing as tolerated    Mobility  Bed Mobility Overal bed mobility: Needs Assistance Bed Mobility: Sit to Supine     Supine to sit: Mod assist Sit to supine: Mod assist   General bed mobility comments: Cues for technique; Required mod assist to help LLE into bed; extra time to problem-solve through task  Transfers Overall transfer level: Needs assistance Equipment used: Rolling walker (2 wheeled) Transfers: Sit to/from Stand Sit to Stand: Min assist         General transfer comment: Cues for post prec, hand placement, and technique  Ambulation/Gait Ambulation/Gait assistance: Min guard Ambulation Distance (Feet): 25 Feet Assistive device: Rolling walker (2 wheeled) Gait Pattern/deviations: Step-to pattern;Trunk flexed Gait velocity: extremely slow   General Gait Details: Cues for gait sequence and to self-monitor for activity tolerance   Stairs Stairs:  Yes Stairs assistance: Min guard Stair Management: No rails;Step to pattern;Backwards;With walker Number of Stairs: 1 (x3) General stair comments: Cues for technique  Wheelchair Mobility    Modified Rankin (Stroke Patients Only)       Balance                                    Cognition Arousal/Alertness: Awake/alert Behavior During Therapy: WFL for tasks assessed/performed Overall Cognitive Status: Within Functional Limits for tasks assessed                      Exercises      General Comments        Pertinent Vitals/Pain Pain Assessment: 0-10 Faces Pain Scale: Hurts little more Pain Location: LLE and back spasm Pain Descriptors / Indicators: Aching;Spasm Pain Intervention(s): Monitored during session;Repositioned    Home Living                      Prior Function            PT Goals (current goals can now be found in the care plan section) Acute Rehab PT Goals Patient Stated Goal: to be better at getting in/out of bed PT Goal Formulation: With patient Time For Goal Achievement: 04/24/14 Potential to Achieve Goals: Good Progress towards PT goals: Progressing toward goals    Frequency  7X/week    PT Plan Current plan remains appropriate    Co-evaluation             End  of Session Equipment Utilized During Treatment: Gait belt Activity Tolerance: Patient tolerated treatment well Patient left: with call bell/phone within reach;in bed;with family/visitor present     Time: 1425-1501 PT Time Calculation (min) (ACUTE ONLY): 36 min  Charges:  $Gait Training: 8-22 mins $Therapeutic Activity: 8-22 mins                    G Codes:      Olen PelGarrigan, Marne Meline Hamff 04/18/2014, 4:56 PM  Van ClinesHolly Loney Peto, [place]  Acute Rehabilitation Services Pager [phone] Office [phone]

## 2014-04-18 NOTE — Plan of Care (Signed)
Problem: Phase I Progression Outcomes Goal: CMS/Neurovascular status WDL Outcome: Completed/Met Date Met:  04/18/14 Goal: Pain controlled with appropriate interventions Outcome: Completed/Met Date Met:  04/18/14 Goal: Dangle or out of bed evening of surgery Outcome: Completed/Met Date Met:  04/18/14 Goal: Initial discharge plan identified Outcome: Completed/Met Date Met:  04/18/14 Goal: Hemodynamically stable Outcome: Completed/Met Date Met:  04/18/14 Goal: Other Phase I Outcomes/Goals Outcome: Completed/Met Date Met:  04/18/14  Problem: Phase II Progression Outcomes Goal: Ambulates Outcome: Completed/Met Date Met:  04/18/14 Goal: Tolerating diet Outcome: Completed/Met Date Met:  04/18/14 Goal: Discharge plan established Outcome: Completed/Met Date Met:  04/18/14 Goal: Other Phase II Outcomes/Goals Outcome: Completed/Met Date Met:  04/18/14  Problem: Phase III Progression Outcomes Goal: Pain controlled on oral analgesia Outcome: Completed/Met Date Met:  04/18/14 Goal: Ambulates Outcome: Completed/Met Date Met:  04/18/14 Goal: Discharge plan remains appropriate-arrangements made Outcome: Completed/Met Date Met:  04/18/14

## 2014-04-19 LAB — CBC
HCT: 35.2 % — ABNORMAL LOW (ref 36.0–46.0)
Hemoglobin: 11.6 g/dL — ABNORMAL LOW (ref 12.0–15.0)
MCH: 30.8 pg (ref 26.0–34.0)
MCHC: 33 g/dL (ref 30.0–36.0)
MCV: 93.4 fL (ref 78.0–100.0)
Platelets: 181 10*3/uL (ref 150–400)
RBC: 3.77 MIL/uL — ABNORMAL LOW (ref 3.87–5.11)
RDW: 13.9 % (ref 11.5–15.5)
WBC: 6.8 10*3/uL (ref 4.0–10.5)

## 2014-04-19 NOTE — Progress Notes (Signed)
PATIENT ID: Kristy Mejia  MRN: [id number]  DOB/AGE:  08/17/1945 / 68 y.o.  2 Days Post-Op Procedure(s) (LRB): LEFT TOTAL HIP REVISION (Left)    PROGRESS NOTE Subjective: Patient is alert, oriented, no Nausea, no Vomiting, yes passing gas, no Bowel Movement. Taking PO well with small bites. Denies SOB, Chest or Calf Pain. Using Incentive Spirometer, PAS in place. Ambulate WBAT Patient reports pain as 4 on 0-10 scale  .    Objective: Vital signs in last 24 hours: Filed Vitals:   04/18/14 2113 04/18/14 2322 04/19/14 0400 04/19/14 0550  BP: 137/59   121/58  Pulse: 96   108  Temp: 99.9 F (37.7 C)   99.3 F (37.4 C)  TempSrc:      Resp: 18 16 16 18   Height:      Weight:      SpO2: 99% 96% 98% 93%      Intake/Output from previous day: I/O last 3 completed shifts: In: 940 [P.O.:940] Out: -    Intake/Output this shift:     LABORATORY DATA:  Recent Labs  04/18/14 0528 04/19/14 0630  WBC 8.9 6.8  HGB 12.2 11.6*  HCT 37.5 35.2*  PLT 210 181  NA 143  --   K 4.4  --   CL 106  --   CO2 27  --   BUN 9  --   CREATININE 0.79  --   GLUCOSE 143*  --   CALCIUM 8.8  --     Examination: Neurologically intact Neurovascular intact Sensation intact distally Intact pulses distally Dorsiflexion/Plantar flexion intact Incision: dressing C/D/I No cellulitis present Compartment soft} XR AP&Lat of hip shows well placed\fixed THA  Assessment:   2 Days Post-Op Procedure(s) (LRB): LEFT TOTAL HIP REVISION (Left) ADDITIONAL DIAGNOSIS:  Expected Acute Blood Loss Anemia,  Plan: PT/OT WBAT, THA  posterior precautions  DVT Prophylaxis: SCDx72 hrs, ASA 325 mg BID x 2 weeks  DISCHARGE PLAN: Home, later today once pt meets therapy goals.  DISCHARGE NEEDS: HHPT, HHRN, Walker and 3-in-1 comode seat

## 2014-04-19 NOTE — Progress Notes (Signed)
Physical Therapy Treatment Patient Details Name: Kristy Mejia MRN: [id number] DOB: 1945/09/25 Today's Date: 04/19/2014    History of Present Illness Admitted for L THA; Posterior Prec; WBAT    PT Comments    Took time to problem-solve through the tasks of getting in and out of bed as that is what is most difficult for pt; Continued difficulty with bed raised to approximate height at home; Recommend pt potentially sleep in recliner at first, and have HH therapies help her with bed mobility in her own bed  On track for dc home today   Follow Up Recommendations  Home health PT;Supervision/Assistance - 24 hour     Equipment Recommendations  Rolling walker with 5" wheels;3in1 (PT) (youth-sized RW)    Recommendations for Other Services       Precautions / Restrictions Precautions Precautions: Posterior Hip Precaution Comments: Educated pt in post hip prec  Restrictions Weight Bearing Restrictions: Yes LLE Weight Bearing: Weight bearing as tolerated    Mobility  Bed Mobility Overal bed mobility: Needs Assistance Bed Mobility: Sit to Supine;Supine to Sit     Supine to sit: Mod assist Sit to supine: Mod assist   General bed mobility comments: Cues for technique; Required mod assist to help LLE into bed; extra time to problem-solve through task with hieght of bed approximating home  Transfers Overall transfer level: Needs assistance Equipment used: Rolling walker (2 wheeled) Transfers: Sit to/from Stand Sit to Stand: Min assist         General transfer comment: Cues for post prec, hand placement, and technique  Ambulation/Gait Ambulation/Gait assistance: Min guard Ambulation Distance (Feet): 30 Feet Assistive device: Rolling walker (2 wheeled) Gait Pattern/deviations: Step-to pattern;Step-through pattern (Emerging step-through pattern) Gait velocity:  slow   General Gait Details: Cues for gait sequence and to self-monitor for activity tolerance   Stairs    Stairs assistance: Min guard     General stair comments: Cues for technique  Wheelchair Mobility    Modified Rankin (Stroke Patients Only)       Balance                                    Cognition Arousal/Alertness: Awake/alert Behavior During Therapy: WFL for tasks assessed/performed Overall Cognitive Status: Within Functional Limits for tasks assessed                      Exercises      General Comments        Pertinent Vitals/Pain Pain Assessment: 0-10 Pain Score: 6  Faces Pain Scale: Hurts little more Pain Location: L thigh Pain Descriptors / Indicators: Aching Pain Intervention(s): Patient requesting pain meds-RN notified;Monitored during session    Home Living                      Prior Function            PT Goals (current goals can now be found in the care plan section) Acute Rehab PT Goals Patient Stated Goal: to be better at getting in/out of bed PT Goal Formulation: With patient Time For Goal Achievement: 04/24/14 Potential to Achieve Goals: Good Progress towards PT goals: Progressing toward goals    Frequency  7X/week    PT Plan Current plan remains appropriate    Co-evaluation             End of Session Equipment Utilized  During Treatment: Gait belt Activity Tolerance: Patient tolerated treatment well Patient left: with call bell/phone within reach;Other (comment) (on toilet in bathroom)     Time: [id number] PT Time Calculation (min) (ACUTE ONLY): 23 min  Charges:  $Gait Training: 8-22 mins $Therapeutic Activity: 23-37 mins                    G Codes:      Olen PelGarrigan, Diane Hanel Hamff 04/19/2014, 3:48 PM  Van ClinesHolly Lanaysia Fritchman, [place]  Acute Rehabilitation Services Pager [phone] Office [phone]

## 2014-04-19 NOTE — Progress Notes (Signed)
Occupational Therapy Treatment Patient Details Name: Kristy LagerJinx S Mejia MRN: [id number] DOB: Oct 07, 1945 Today's Date: 04/19/2014    History of present illness Admitted for L THA; Posterior Prec; WBAT   OT comments  Pt seen today for ADL session to prepare pt for d/c. Pt continues to move slowly and has difficulty with bed mobility, although she plans to sleep in a recliner at home. Pt plans d/c this evening and is safe for d/c from OT standpoint once medically cleared.    Follow Up Recommendations  No OT follow up;Supervision - Intermittent    Equipment Recommendations  3 in 1 bedside comode;Other (comment);Tub/shower bench (AE)    Recommendations for Other Services      Precautions / Restrictions Precautions Precautions: Posterior Hip Precaution Comments: Reinforced education on 3/3 hip precautions. Pt able to independently recall 2/3.  Restrictions Weight Bearing Restrictions: Yes LLE Weight Bearing: Weight bearing as tolerated       Mobility Bed Mobility Overal bed mobility: Needs Assistance Bed Mobility: Supine to Sit     Supine to sit: Mod assist     General bed mobility comments: Mod (A) to manage LLE and to square hips using bed pad; however pt required extra time to get to EOB without increased assistance.   Transfers Overall transfer level: Needs assistance Equipment used: Rolling walker (2 wheeled) Transfers: Sit to/from Stand Sit to Stand: Min assist         General transfer comment: Min (A) stabilize and power up. Cues for hand placement.         ADL Overall ADL's : Needs assistance/impaired                 Upper Body Dressing : Set up;Sitting   Lower Body Dressing: Minimal assistance;With adaptive equipment;Sit to/from stand   Toilet Transfer: Minimal assistance;RW (sit<>stand from bed)           Functional mobility during ADLs: Min guard;Rolling walker General ADL Comments: Pt donned clothing to prepare for d/c home. Pt reports that  she has a reacher at home to assist with LB ADLs. Pt moving slow and continues to have difficulty with bed mobility. Pt reports that she plans to sleep in recliner chair for ease of getting OOB.                 Cognition  Arousal/Alertness: Awake/Alert Behavior During Therapy: WFL for tasks assessed/performed Overall Cognitive Status: Within Functional Limits for tasks assessed                                    Pertinent Vitals/ Pain       Pain Assessment: 0-10 Pain Score: 4  Pain Location: L thigh Pain Descriptors / Indicators: Aching Pain Intervention(s): Limited activity within patient's tolerance;Monitored during session         Frequency Min 2X/week     Progress Toward Goals  OT Goals(current goals can now be found in the care plan section)  Progress towards OT goals: Progressing toward goals     Plan Discharge plan remains appropriate       End of Session Equipment Utilized During Treatment: Gait belt;Rolling walker   Activity Tolerance Patient tolerated treatment well   Patient Left Other (comment) (with PT for PT session)   Nurse Communication          Time: 7829-56211501-1520 OT Time Calculation (min): 19 min  Charges: OT General Charges $OT  Visit: 1 Procedure OT Treatments $Self Care/Home Management : 8-22 mins  Rae LipsMiller, Shakiera Edelson M 04/19/2014, 3:30 PM  Carney LivingLeeAnn Marie Mariabella Nilsen, OTR/L Occupational Therapist [phone] (pager)

## 2014-04-19 NOTE — Care Management Note (Signed)
CARE MANAGEMENT NOTE 04/19/2014  Patient:  Jeri LagerKENAN,Ty S   Account Number:  [id number]  Date Initiated:  04/19/2014  Documentation initiated by:  Vance PeperBRADY,Moesha Sarchet  Subjective/Objective Assessment:   68 yr old female admitted with left total hip polywear. PAtient had a left total hip revision.     Action/Plan:   Case manager spoke with patient concerning home health and DME needs. [facility] no changes.   Anticipated DC Date:  04/19/2014   Anticipated DC Plan:  HOME W HOME HEALTH SERVICES      DC Planning Services  CM consult      [facility]  DURABLE MEDICAL EQUIPMENT   Choice offered to / List presented to:  C-1 Patient   DME arranged  [facility]  3-N-1      DME agency  [facility]     HH arranged  HH-2 PT      [facility]   Status of service:  Completed, signed off Medicare Important Message given?   (If response is "NO", the following Medicare IM given date fields will be blank) Date Medicare IM given:   Medicare IM given by:   Date Additional Medicare IM given:   Additional Medicare IM given by:    Discharge Disposition:  HOME W HOME HEALTH SERVICES  Per UR Regulation:  Reviewed for med. necessity/level of care/duration of stay

## 2014-04-19 NOTE — Progress Notes (Signed)
Physical Therapy Treatment Patient Details Name: Kristy LagerJinx S Ehly MRN: [id number] DOB: 19-May-1946 Today's Date: 04/19/2014    History of Present Illness Admitted for L THA; Posterior Prec; WBAT    PT Comments    More efficient gait and smoother transitions; pt is clearly feeling better and is confident about dc'ing today   Follow Up Recommendations  Home health PT;Supervision/Assistance - 24 hour     Equipment Recommendations  Rolling walker with 5" wheels;3in1 (PT) (youth-sized RW)    Recommendations for Other Services       Precautions / Restrictions Precautions Precautions: Posterior Hip Precaution Comments: Reinforced education on 3/3 hip precautions. Pt able to independently recall 2/3.  Restrictions Weight Bearing Restrictions: Yes LLE Weight Bearing: Weight bearing as tolerated    Mobility  Bed Mobility Overal bed mobility: Needs Assistance Bed Mobility: Sit to Supine;Supine to Sit     Supine to sit: Mod assist Sit to supine: Min guard (bed in lowest position)   General bed mobility comments: Much easier getting into a lower bed  Transfers Overall transfer level: Needs assistance Equipment used: Rolling walker (2 wheeled) Transfers: Sit to/from Stand Sit to Stand: Min guard         General transfer comment: Cues for hand placement.   Ambulation/Gait Ambulation/Gait assistance: Min guard Ambulation Distance (Feet): 60 Feet Assistive device: Rolling walker (2 wheeled) Gait Pattern/deviations: Step-through pattern;Trunk flexed (emerging) Gait velocity: improving   General Gait Details: Cues for gait sequence and to self-monitor for activity tolerance   Stairs   Stairs assistance: Min guard     General stair comments: Cues for technique  Wheelchair Mobility    Modified Rankin (Stroke Patients Only)       Balance                                    Cognition Arousal/Alertness: Awake/alert Behavior During Therapy: WFL for  tasks assessed/performed Overall Cognitive Status: Within Functional Limits for tasks assessed                      Exercises      General Comments        Pertinent Vitals/Pain Pain Assessment: Faces Pain Score: 6  Faces Pain Scale: Hurts little more Pain Location: L anterior thigh, no back spasms this session Pain Descriptors / Indicators: Grimacing Pain Intervention(s): Monitored during session;Repositioned    Home Living                      Prior Function            PT Goals (current goals can now be found in the care plan section) Acute Rehab PT Goals Patient Stated Goal: to be better at getting in/out of bed PT Goal Formulation: With patient Time For Goal Achievement: 04/24/14 Potential to Achieve Goals: Good Progress towards PT goals: Progressing toward goals    Frequency  7X/week    PT Plan Current plan remains appropriate    Co-evaluation             End of Session Equipment Utilized During Treatment: Gait belt Activity Tolerance: Patient tolerated treatment well Patient left: with call bell/phone within reach;in bed;with family/visitor present     Time: 4098-11911515-1532 PT Time Calculation (min) (ACUTE ONLY): 17 min  Charges:  $Gait Training: 8-22 mins $Therapeutic Activity: 23-37 mins  G Codes:      Van ClinesGarrigan, Janel Beane [facility] 04/19/2014, 4:41 PM  Van ClinesHolly Anasia Agro, [place]PT  Acute Rehabilitation Services Pager [phone] Office [phone]

## 2014-04-19 NOTE — Discharge Summary (Signed)
Patient ID: Kristy Mejia MRN: [id number] DOB/AGE: Sep 09, 1945 68 y.o.  Admit date: 04/17/2014 Discharge date: 04/19/2014  Admission Diagnoses:  Principal Problem:   Prosthetic hip implant failure, Left Active Problems:   Status post revision of total hip   Discharge Diagnoses:  Same  Past Medical History  Diagnosis Date  . Obesity, unspecified   . Arthritis   . Pneumonia 2010    hx of  . Joint pain   . Back pain     scoliosis    Surgeries: Procedure(s): LEFT TOTAL HIP REVISION on 04/17/2014   Consultants:    Discharged Condition: Improved  Hospital Course: Kristy Mejia is an 68 y.o. female who was admitted 04/17/2014 for operative treatment ofProsthetic hip implant failure. Patient has severe unremitting pain that affects sleep, daily activities, and work/hobbies. After pre-op clearance the patient was taken to the operating room on 04/17/2014 and underwent  Procedure(s): LEFT TOTAL HIP REVISION.    Patient was given perioperative antibiotics: Anti-infectives    Start     Dose/Rate Route Frequency Ordered Stop   04/17/14 0600  ceFAZolin (ANCEF) IVPB 2 g/50 mL premix     2 g100 mL/hr over 30 Minutes Intravenous On call to O.R. 04/16/14 1346 04/17/14 0740       Patient was given sequential compression devices, early ambulation, and chemoprophylaxis to prevent DVT.  Patient benefited maximally from hospital stay and there were no complications.    Recent vital signs: Patient Vitals for the past 24 hrs:  BP Temp Pulse Resp SpO2  04/19/14 0550 (!) 121/58 mmHg 99.3 F (37.4 C) (!) 108 18 93 %  04/19/14 0400 - - - 16 98 %  04/18/14 2322 - - - 16 96 %  04/18/14 2113 (!) 137/59 mmHg 99.9 F (37.7 C) 96 18 99 %  04/18/14 2000 - - - 16 99 %  04/18/14 1600 - - - 18 100 %  04/18/14 1300 130/62 mmHg 99.4 F (37.4 C) 96 18 100 %  04/18/14 1200 - - - 18 100 %     Recent laboratory studies:  Recent Labs  04/18/14 0528 04/19/14 0630  WBC 8.9 6.8  HGB 12.2 11.6*   HCT 37.5 35.2*  PLT 210 181  NA 143  --   K 4.4  --   CL 106  --   CO2 27  --   BUN 9  --   CREATININE 0.79  --   GLUCOSE 143*  --   CALCIUM 8.8  --      Discharge Medications:     Medication List    TAKE these medications        aspirin EC 325 MG tablet  Take 1 tablet (325 mg total) by mouth 2 (two) times daily.     methocarbamol 500 MG tablet  Commonly known as:  ROBAXIN  Take 1 tablet (500 mg total) by mouth 2 (two) times daily with a meal.     oxyCODONE-acetaminophen 5-325 MG per tablet  Commonly known as:  ROXICET  Take 1 tablet by mouth every 4 (four) hours as needed.        Diagnostic Studies: Dg Chest 2 View  04/07/2014   CLINICAL DATA:  Three emission for total left hip revision.  EXAM: CHEST  2 VIEW  COMPARISON:  None.  FINDINGS: The heart size and mediastinal contours are within normal limits. There is no focal infiltrate, pulmonary edema, or pleural effusion. There is marked scoliosis of spine.  IMPRESSION: No active  cardiopulmonary disease.   Electronically Signed   By: Sherian ReinWei-Chen  Lin M.D.   On: 04/07/2014 12:55   Dg Pelvis Portable  04/17/2014   CLINICAL DATA:  Left hip replacement  EXAM: PORTABLE PELVIS 1-2 VIEWS  COMPARISON:  None.  FINDINGS: Total left hip arthroplasty without failure or complication. Small sliver of bone adjacent to the medial aspect of the lesser trochanter. No hardware failure or complication. No fracture or dislocation.  IMPRESSION: Total left hip arthroplasty.   Electronically Signed   By: Elige KoHetal  Patel   On: 04/17/2014 11:04    Disposition:       Discharge Instructions    Call MD / Call 911    Complete by:  As directed   If you experience chest pain or shortness of breath, CALL 911 and be transported to the hospital emergency room.  If you develope a fever above 101 F, pus (white drainage) or increased drainage or redness at the wound, or calf pain, call your surgeon's office.     Change dressing    Complete by:  As directed    You may change your dressing on day 5, then change the dressing daily with sterile 4 x 4 inch gauze dressing and paper tape.  You may clean the incision with alcohol prior to redressing     Constipation Prevention    Complete by:  As directed   Drink plenty of fluids.  Prune juice may be helpful.  You may use a stool softener, such as Colace (over the counter) 100 mg twice a day.  Use MiraLax (over the counter) for constipation as needed.     Diet - low sodium heart healthy    Complete by:  As directed      Discharge instructions    Complete by:  As directed   Follow up in office with Dr. Turner Danielsowan in 2 weeks.     Driving restrictions    Complete by:  As directed   No driving for 2 weeks     Follow the hip precautions as taught in Physical Therapy    Complete by:  As directed      Increase activity slowly as tolerated    Complete by:  As directed      Patient may shower    Complete by:  As directed   You may shower without a dressing once there is no drainage.  Do not wash over the wound.  If drainage remains, cover wound with plastic wrap and then shower.           Follow-up Information    Follow up with Nestor LewandowskyOWAN,FRANK J, MD In 2 weeks.   Specialty:  Orthopedic Surgery   Contact information:   [address] [place] [address] [phone]        Signed: Vear ClockHILLIPS, ERIC R 04/19/2014, 8:10 AM

## 2014-04-19 NOTE — Plan of Care (Signed)
Problem: Consults Goal: Total Joint Replacement Patient Education See Patient Education Module for education specifics.  Outcome: Completed/Met Date Met:  04/19/14 Goal: Diagnosis- Total Joint Replacement Primary Total Hip Goal: Skin Care Protocol Initiated - if Braden Score 18 or less If consults are not indicated, leave blank or document N/A  Outcome: Completed/Met Date Met:  04/19/14 Goal: Nutrition Consult-if indicated Outcome: Not Applicable Date Met:  02/08/29 Goal: Diabetes Guidelines if Diabetic/Glucose > 140 If diabetic or lab glucose is > 140 mg/dl - Initiate Diabetes/Hyperglycemia Guidelines & Document Interventions  Outcome: Not Applicable Date Met:  07/62/26  Problem: Discharge Progression Outcomes Goal: CMS/Neurovascular status at or above baseline Outcome: Completed/Met Date Met:  04/19/14 Goal: Pain controlled with appropriate interventions Outcome: Completed/Met Date Met:  33/35/45 Goal: Complications resolved/controlled Outcome: Not Applicable Date Met:  62/56/38 Goal: Tolerates diet Outcome: Completed/Met Date Met:  04/19/14 Goal: Activity appropriate for discharge plan Outcome: Completed/Met Date Met:  04/19/14

## 2014-07-12 ENCOUNTER — Ambulatory Visit: Payer: BC Managed Care – PPO | Admitting: General Surgery

## 2014-09-14 ENCOUNTER — Encounter: Payer: Self-pay | Admitting: *Deleted

## 2015-01-10 ENCOUNTER — Other Ambulatory Visit: Payer: Self-pay | Admitting: Internal Medicine

## 2015-01-10 DIAGNOSIS — Z0000 Encounter for general adult medical examination without abnormal findings: Secondary | ICD-10-CM

## 2015-01-10 DIAGNOSIS — E2839 Other primary ovarian failure: Secondary | ICD-10-CM

## 2015-04-19 ENCOUNTER — Emergency Department: Payer: BC Managed Care – PPO

## 2015-04-19 ENCOUNTER — Encounter: Payer: Self-pay | Admitting: *Deleted

## 2015-04-19 ENCOUNTER — Emergency Department
Admission: EM | Admit: 2015-04-19 | Discharge: 2015-04-19 | Disposition: A | Discharge status: Home / Self Care | Payer: BC Managed Care – PPO | Attending: Emergency Medicine | Admitting: Emergency Medicine

## 2015-04-19 DIAGNOSIS — R05 Cough: Secondary | ICD-10-CM | POA: Insufficient documentation

## 2015-04-19 DIAGNOSIS — R748 Abnormal levels of other serum enzymes: Secondary | ICD-10-CM | POA: Insufficient documentation

## 2015-04-19 DIAGNOSIS — R7989 Other specified abnormal findings of blood chemistry: Secondary | ICD-10-CM

## 2015-04-19 DIAGNOSIS — I471 Supraventricular tachycardia: Secondary | ICD-10-CM | POA: Insufficient documentation

## 2015-04-19 DIAGNOSIS — F419 Anxiety disorder, unspecified: Secondary | ICD-10-CM | POA: Diagnosis not present

## 2015-04-19 DIAGNOSIS — R000 Tachycardia, unspecified: Secondary | ICD-10-CM | POA: Diagnosis present

## 2015-04-19 DIAGNOSIS — R778 Other specified abnormalities of plasma proteins: Secondary | ICD-10-CM

## 2015-04-19 LAB — CBC WITH DIFFERENTIAL/PLATELET
Basophils Absolute: 0.1 10*3/uL (ref 0–0.1)
Basophils Relative: 2 %
Eosinophils Absolute: 0.1 10*3/uL (ref 0–0.7)
Eosinophils Relative: 3 %
HCT: 44.2 % (ref 35.0–47.0)
HEMOGLOBIN: 14.5 g/dL (ref 12.0–16.0)
Lymphocytes Relative: 42 %
Lymphs Abs: 1.9 10*3/uL (ref 1.0–3.6)
MCH: 29.7 pg (ref 26.0–34.0)
MCHC: 32.9 g/dL (ref 32.0–36.0)
MCV: 90.4 fL (ref 80.0–100.0)
Monocytes Absolute: 0.3 10*3/uL (ref 0.2–0.9)
Monocytes Relative: 7 %
NEUTROS PCT: 46 %
Neutro Abs: 2.1 10*3/uL (ref 1.4–6.5)
Platelets: 285 10*3/uL (ref 150–440)
RBC: 4.89 MIL/uL (ref 3.80–5.20)
RDW: 13.6 % (ref 11.5–14.5)
WBC: 4.5 10*3/uL (ref 3.6–11.0)

## 2015-04-19 LAB — COMPREHENSIVE METABOLIC PANEL
ALBUMIN: 3.9 g/dL (ref 3.5–5.0)
ALT: 24 U/L (ref 14–54)
ANION GAP: 9 (ref 5–15)
AST: 36 U/L (ref 15–41)
Alkaline Phosphatase: 88 U/L (ref 38–126)
BILIRUBIN TOTAL: 0.6 mg/dL (ref 0.3–1.2)
BUN: 17 mg/dL (ref 6–20)
CO2: 25 mmol/L (ref 22–32)
Calcium: 9.9 mg/dL (ref 8.9–10.3)
Chloride: 106 mmol/L (ref 101–111)
Creatinine, Ser: 0.85 mg/dL (ref 0.44–1.00)
Glucose, Bld: 202 mg/dL — ABNORMAL HIGH (ref 65–99)
POTASSIUM: 3.6 mmol/L (ref 3.5–5.1)
Sodium: 140 mmol/L (ref 135–145)
TOTAL PROTEIN: 7.1 g/dL (ref 6.5–8.1)

## 2015-04-19 LAB — TROPONIN I
TROPONIN I: 0.09 ng/mL — AB (ref <0.031)
Troponin I: 0.39 ng/mL — ABNORMAL HIGH (ref <0.031)

## 2015-04-19 LAB — MAGNESIUM: Magnesium: 1.8 mg/dL (ref 1.7–2.4)

## 2015-04-19 MED ORDER — METOPROLOL TARTRATE 25 MG PO TABS: ORAL_TABLET | ORAL | Status: DC

## 2015-04-19 MED ORDER — SODIUM CHLORIDE 0.9 % IV BOLUS (SEPSIS)
1000.0000 mL | Freq: Once | INTRAVENOUS | Status: AC
  Administered 2015-04-19: 1000 mL via INTRAVENOUS

## 2015-04-19 MED ORDER — ADENOSINE 12 MG/4ML IV SOLN
INTRAVENOUS | Status: AC
  Filled 2015-04-19: qty 4

## 2015-04-19 MED ORDER — ADENOSINE 6 MG/2ML IV SOLN
6.0000 mg | Freq: Once | INTRAVENOUS | Status: AC
  Administered 2015-04-19: 6 mg via INTRAVENOUS

## 2015-04-19 MED ORDER — ADENOSINE 6 MG/2ML IV SOLN
INTRAVENOUS | Status: AC
  Administered 2015-04-19: 6 mg via INTRAVENOUS
  Filled 2015-04-19: qty 2

## 2015-04-19 NOTE — Discharge Instructions (Signed)
As we discussed, your signs and symptoms strongly suggest a condition called paroxysmal supraventricular tachycardia (PSVT).  This is a generally benign condition, but we recommend you follow up with the listed cardiologist for further evaluation.  Please read through the included information and try some of the techniques we discussed the next time you have the symptoms.  We do encourage you to take a baby aspirin (81 mg) daily until or unless you follow up with your regular doctor and they tell you to stop.  Return to the Emergency Department if you develop new or worsening symptoms that concern you.    Paroxysmal Supraventricular Tachycardia Paroxysmal supraventricular tachycardia (PSVT) is a type of abnormal heart rhythm. It causes your heart to beat very quickly and then suddenly stop beating so quickly. A normal heart rate is 60-100 beats per minute. During an episode of PSVT, your heart rate may be 150-250 beats per minute. This can make you feel light-headed and short of breath. An episode of PSVT can be frightening. It is usually not dangerous. The heart has four chambers. All chambers need to work together for the heart to beat effectively. A normal heartbeat usually starts in the right upper chamber of the heart (atrium) when an area (sinoatrial node) puts out an electrical signal that spreads to the other chambers. People with PSVT may have abnormal electrical pathways, or they may have other areas in the upper chambers that send out electrical signals. The result is a very rapid heartbeat. When your heart beats very quickly, it does not have time to fill completely with blood. When PSVT happens often or it lasts for long periods, it can lead to heart weakness and failure. Most people with PSVT do not have any other heart disease. CAUSES Abnormal electrical activity in the heart causes PSVT. It is not known why some people get PSVT and others do not. RISK FACTORS You may be more likely to  have PSVT if:  You are 15-12 years old.  You are a woman. Other factors that may increase your chances of an attack include:  Stress.  Being tired.  Smoking.  Stimulant drugs.  Alcoholic drinks.  Caffeine.  Pregnancy. SIGNS AND SYMPTOMS A mild episode of PSVT may cause no symptoms. If you do have signs and symptoms, they may include:  A pounding heart.  Feeling of skipped heartbeats (palpitations).  Weakness.  Shortness of breath.  Tightness or pain in your chest.  Light-headedness.  Anxiety.  Dizziness.  Sweating.  Nausea.  A fainting spell. DIAGNOSIS Your health care provider may suspect PSVT if you have symptoms that come and go. The health care provider will do a physical exam. If you are having an episode during the exam, the health care provider may be able to diagnose PSVT by listening to your heart and feeling your pulse. Tests may also be done, including:  An electrical study of your heart (electrocardiogram, or ECG).  A test in which you wear a portable ECG monitor all day (Holter monitor) or for several days (event monitor).  A test that involves taking an image of your heart using sound waves (echocardiogram) to rule out other causes of a fast heart rate. TREATMENT You may not need treatment if episodes of PSVT do not happen often or if they do not cause symptoms. If PSVT episodes do cause symptoms, your health care provider may first suggest trying a self-treatment called vagus nerve stimulation. The vagus nerve extends down from the brain. It regulates  certain body functions. Stimulating this nerve can slow down the heart. Your health care provider can teach you ways to do this. You may need to try a few ways to find what works best for you. Options include:  Holding your breath and pushing, as though you are having a bowel movement.  Massaging an area on one side of your neck below your jaw.  Bending forward with your head between your  legs.  Bending forward with your head between your legs and coughing.  Massaging your eyeballs with your eyes closed. If vagus nerve stimulation does not work, other treatment options include:  Medicines to prevent an attack.  Being treated in the hospital with medicine or electric shock to stop an attack (cardioversion). This treatment can include:  Getting medicine through an IV line.  Having a small electric shock delivered to your heart. You will be given medicine to make you sleep through this procedure.  If you have frequent episodes with symptoms, you may need a procedure to get rid of the faulty areas of your heart (radiofrequency ablation) and end the episodes of PSVT. In this procedure:  A long, thin tube (catheter) is passed through one of your veins into your heart.  Energy directed through the catheter eliminates the areas of your heart that are causing abnormal electric stimulation. HOME CARE INSTRUCTIONS  Take medicines only as directed by your health care provider.  Do not use caffeine in any form if caffeine triggers episodes of PSVT. Otherwise, consume caffeine in moderation. This means no more than a few cups of coffee or the equivalent each day.  Do not drink alcohol if alcohol triggers episodes of PSVT. Otherwise, limit alcohol intake to no more than 1 drink per day for nonpregnant women and 2 drinks per day for men. One drink equals 12 ounces of beer, 5 ounces of wine, or 1 ounces of hard liquor.  Do not use any tobacco products, including cigarettes, chewing tobacco, or electronic cigarettes. If you need help quitting, ask your health care provider.  Try to get at least 7 hours of sleep each night.  Find healthy ways to manage stress.  Perform vagus nerve stimulation as directed by your health care provider.  Maintain a healthy weight.  Get some exercise on most days. Ask your health care provider to suggest some good activities for you. SEEK MEDICAL  CARE IF:  You are having episodes of PSVT more often, or they are lasting longer.  Vagus nerve stimulation is no longer helping.  You have new symptoms during an episode. SEEK IMMEDIATE MEDICAL CARE IF:  You have chest pain or trouble breathing.  You have an episode of PSVT that has lasted longer than 20 minutes.  You have passed out from an episode of PSVT. These symptoms may represent a serious problem that is an emergency. Do not wait to see if the symptoms will go away. Get medical help right away. Call your local emergency services (911 in the [place] Do not drive yourself to the hospital.   This information is not intended to replace advice given to you by your health care provider. Make sure you discuss any questions you have with your health care provider.   Document Released: 05/05/2005 Document Revised: 05/26/2014 Document Reviewed: 10/13/2013 Elsevier Interactive Patient Education [employer].

## 2015-04-19 NOTE — ED Notes (Signed)
Pt states sudden onset of rapid HR after eating something cold approx 2 hours ago. Pt describes tightness of chest at this time and feeling of having to cough. Color WNL.

## 2015-04-19 NOTE — ED Provider Notes (Addendum)
[facility] Emergency Department Provider Note ____________________________________________  Time seen: Approximately 4:55 PM  I have reviewed the triage vital signs and the nursing notes.   HISTORY  Chief Complaint Tachycardia    HPI Kristy Mejia is a 69 y.o. female who is very healthy for her age and takes no medications other than occasional aspirin who presents with chest discomfort and mild shortness of breath.  She states that it started acutely 2 hours ago while she was eating and felt like a fluttering and palpitations sensation.  It is been constant since then and accompanied with mild to moderate chest pressure and difficulty catching her breath.  She feels her heart racing.  Nothing makes it better and nothing makes it worse.  She has had no abdominal pain, nausea/vomiting, altered mental status.  She arrives by private vehicle in the company of her husband.He has never had symptoms like this before.  She dates that she has not had any fever or chills but she recently had bronchitis and has had some persistent cough.   Past Medical History  Diagnosis Date  . Obesity, unspecified   . Arthritis   . Pneumonia 2010    hx of  . Joint pain   . Back pain     scoliosis    Patient Active Problem List   Diagnosis Date Noted  . Status post revision of total hip 04/17/2014  . 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 Family history of breast cancer 06/22/2013  . Diffuse cystic mastopathy 06/20/2013  . Palpitations 09/25/2011  . Dyspnea 09/25/2011    Past Surgical History  Procedure Laterality Date  . Partial hysterectomy  1990  . Total hip arthroplasty  2006  . Other surgical history  2004    Bilateral cataracts  . Back surgery  1960's    d/t scoliosis-1964/65?  Marland Kitchen. Foot surgery  1960's  . Colonoscopy  2007    Dr. Mechele CollinElliott  . Eye surgery  2007    cataracts  . Breast surgery  1990    excision breast mass  . Colonoscopy with  esophagogastroduodenoscopy (egd) and esophageal dilation (ed)    . Revision total hip arthroplasty Left 04/17/2014    DR Turner DanielsOWAN  . Total hip revision Left 04/17/2014    Procedure: LEFT TOTAL HIP REVISION;  Surgeon: Nestor LewandowskyFrank J Rowan, MD;  Location: [facility] OR;  Service: Orthopedics;  Laterality: Left;    Current Outpatient Rx  Name  Route  Sig  Dispense  Refill  . acetaminophen (TYLENOL) 325 MG tablet   Oral   Take 650 mg by mouth every 6 (six) hours as needed for moderate pain.          . metoprolol tartrate (LOPRESSOR) 25 MG tablet      Take 1 tablet PO as needed for rapid heart rate/palpitations (SVT). If symptoms continue after 15 minutes, take 1 additional tablet. Repeat no more frequently than every 8 hours as needed. Do not exceed 6 tablets in 24 hours.   30 tablet   0     Allergies Tape  Family History  Problem Relation Age of Onset  . Heart attack Father   . Heart disease Father   . Cancer Mother     breast cancer    Social History Social History  Substance Use Topics  . Smoking status: Never Smoker   . Smokeless tobacco: Never Used  . Alcohol Use: No    Review of Systems Constitutional: No fever/chills Eyes: No  visual changes. ENT: No sore throat. Cardiovascular: Chest heaviness and palpitations Respiratory: Mild Shortness of breath Gastrointestinal: No abdominal pain.  No nausea, no vomiting.  No diarrhea.  No constipation. Genitourinary: Negative for dysuria. Musculoskeletal: Negative for back pain. Skin: Negative for rash. Neurological: Negative for headaches, focal weakness or numbness.  10-point ROS otherwise negative.  ____________________________________________   PHYSICAL EXAM:  VITAL SIGNS: ED Triage Vitals  Enc Vitals Group     BP 04/19/15 1704 153/80 mmHg     Pulse Rate 04/19/15 1704 196     Resp 04/19/15 1704 24     Temp 04/19/15 1704 97.8 F (36.6 C)     Temp Source 04/19/15 1704 Oral     SpO2 04/19/15 1704 96 %     Weight 04/19/15  1704 190 lb (86.183 kg)     Height --      Head Cir --      Peak Flow --      Pain Score --      Pain Loc --      Pain Edu? --      Excl. in GC? --     Constitutional: Alert and oriented. Well appearing in general, appears somewhat anxious but in no significant distress Eyes: Conjunctivae are normal. PERRL. EOMI. Head: Atraumatic. Nose: No congestion/rhinnorhea. Mouth/Throat: Mucous membranes are moist.  Oropharynx non-erythematous. Neck: No stridor.   Cardiovascular: Rapid narrow complex tachycardia in the upper 190s, regular rhythm. Grossly normal heart sounds.  Good peripheral circulation. Respiratory: Normal respiratory effort.  No retractions. Lungs CTAB. Gastrointestinal: Soft and nontender. No distention. No abdominal bruits. No CVA tenderness. Musculoskeletal: No lower extremity tenderness nor edema.  No joint effusions. Neurologic:  Normal speech and language. No gross focal neurologic deficits are appreciated.  Skin:  Skin is warm, dry and intact. No rash noted. Psychiatric: Mood and affect are normal. Speech and behavior are normal.  ____________________________________________   LABS (all labs ordered are listed, but only abnormal results are displayed)  Labs Reviewed  COMPREHENSIVE METABOLIC PANEL - Abnormal; Notable for the following:    Glucose, Bld 202 (*)    All other components within normal limits  TROPONIN I - Abnormal; Notable for the following:    Troponin I 0.09 (*)    All other components within normal limits  TROPONIN I - Abnormal; Notable for the following:    Troponin I 0.39 (*)    All other components within normal limits  CBC WITH DIFFERENTIAL/PLATELET  MAGNESIUM  URINALYSIS COMPLETEWITH MICROSCOPIC ([facility] ONLY)   ____________________________________________  EKG  ED ECG REPORT  #1 I, Cintya Daughety, the attending physician, personally viewed and interpreted this ECG.   Date: 04/19/2015  EKG Time: 17:01  Rate: 194  Rhythm:  Supraventricular tachycardia  Axis: Normal  Intervals:SVT out P waves, otherwise normal intervals  ST&T Change: Mild ST depression throughout consistent with demand ischemia  ED ECG REPORT #2 I, Brentin Shin, the attending physician, personally viewed and interpreted this ECG.   Date: 04/19/2015  EKG Time: 17:15  Rate: 116  Rhythm: sinus tachycardia  Axis: Normal  Intervals:Normal  ST&T Change: Unremarkable  ____________________________________________  RADIOLOGY   Dg Chest 2 View  04/19/2015  CLINICAL DATA:  Tachycardia EXAM: CHEST  2 VIEW COMPARISON:  04/07/2014 FINDINGS: Cardiomediastinal silhouette is stable. Significant S-shaped thoracolumbar scoliosis again noted. Large hiatal hernia again noted. No acute infiltrate or pleural effusion. No pulmonary edema. IMPRESSION: Significant S-shaped thoracolumbar scoliosis again noted. Large hiatal hernia again noted. No acute infiltrate  or pleural effusion. No pulmonary edema. Electronically Signed   By: Natasha Mead M.D.   On: 04/19/2015 18:38    ____________________________________________   PROCEDURES  Procedure(s) performed: chemical cardioversion  Chemical Cardioversion procedure note: Time of procedure:  Approximately 17:15 Patient placed on continuous cardiac monitoring with pulse oximetry.  Zoll pads and monitor in place.  Code cart in room.  Risks and benefits discussed with patient, verbal consent obtained.  Suction and BVM ready and available.  Attempted vagal maneuvers twice, no success.  Chemical cardioversion successful with fast push of adenosine 6 mg IV; converted to sinus tachycardia, verified with repeat ECG as described above. Well tolerated, no adverse reactions.  Hemodynamically stable throughout.   Critical Care performed: Yes, see critical care note(s)   CRITICAL CARE Performed by: Loleta Rose   Total critical care time: 30 minutes  Critical care time was exclusive of separately billable procedures  and treating other patients.  Critical care was necessary to treat or prevent imminent or life-threatening deterioration.  Critical care was time spent personally by me on the following activities: development of treatment plan with patient and/or surrogate as well as nursing, discussions with consultants, evaluation of patient's response to treatment, examination of patient, obtaining history from patient or surrogate, ordering and performing treatments and interventions, ordering and review of laboratory studies, ordering and review of radiographic studies, pulse oximetry and re-evaluation of patient's condition.  ____________________________________________   INITIAL IMPRESSION / ASSESSMENT AND PLAN / ED COURSE  Pertinent labs & imaging results that were available during my care of the patient were reviewed by me and considered in my medical decision making (see chart for details).  Stable SVT.  As we were getting the patient settled in and on the Zoll monitor with pads, I walked her through trying vagal maneuvers several times (blowing firmly on her thumb is long and hard as she could).  This was unsuccessful.  We then performed a fast push of adenosine 6 mg IV followed by a normal saline flush.  After a few seconds the patient had the expected response and converted to a sinus tachycardia with a rate in the 110s.  A second EKG was obtained and as documented above.  Given that she has not had this happen before, I will perform a broader evaluation with labs and chest x-ray and give her a liter of fluids.  She states that she feels much better after the cardioversion.  ----------------------------------------- 9:36 PM on 04/19/2015 -----------------------------------------  I have checked on the patient multiple times during her emergency department stay and she has remained asymptomatic with a heart rate in the 80s.  I spoke by phone with Dr. Onalee Hua with [facility].  He agreed with my  plan for outpatient follow-up and when necessary metoprolol tartrate if she has additional symptoms at home.  I also discussed with him both elevated troponins (0.09 --> 0.39) but we both agree, particularly in an otherwise asymptomatic patient, that this is stress leak (demand ischemia) and not evidence of ACS.  She received a full dose aspirin at home before coming to the emergency department and I encouraged her to take a daily baby aspirin at least until she follows up with her doctor.  Dr. Onalee Hua is going to pass along the patient's information to Dr. Shirlee Latch, and I encouraged the patient to call Dr. Shirlee Latch office to schedule the next available appointment.  I gave my usual and customary return precautions.     ____________________________________________  FINAL CLINICAL  IMPRESSION(S) / ED DIAGNOSES  Final diagnoses:  SVT (supraventricular tachycardia) (HCC)  Elevated troponin I level      NEW MEDICATIONS STARTED DURING THIS VISIT:  New Prescriptions   METOPROLOL TARTRATE (LOPRESSOR) 25 MG TABLET    Take 1 tablet PO as needed for rapid heart rate/palpitations (SVT). If symptoms continue after 15 minutes, take 1 additional tablet. Repeat no more frequently than every 8 hours as needed. Do not exceed 6 tablets in 24 hours.     Loleta Rose, MD 04/19/15 1610  Loleta Rose, MD 05/03/15 [phone]

## 2015-04-19 NOTE — ED Notes (Signed)
Pt converted with one dose of adenosine. Pt recently getting over bronchitis. This has occurred a year ago but went away on its own.

## 2015-04-20 ENCOUNTER — Telehealth: Payer: Self-pay | Admitting: Cardiology

## 2015-04-20 NOTE — Telephone Encounter (Signed)
Spoke with the pt and she is calling into the office to schedule an appt with any Cardiologist for hospital follow-up in the ER at [facility] for stable SVT on 04/19/15.  Pt prefers re-establishing care at our [place] office, but if that can't be accommodated, then its fine to [facility].  Pt can come in any day next week, except Tuesday afternoon.  Informed the pt that I will go and speak with our scheduling dept, and have them call her back to have her scheduled.  Pt verbalized understanding and agrees with this plan.  Spoke with [employer] Supervisor in scheduling, and initial scheduler Yolande JollyKedra will follow-up with the pt to have this appt scheduled.

## 2015-04-20 NOTE — Telephone Encounter (Signed)
New Message    Pt states she was instructed by ED doctor to call and make appt with Dr. Shirlee LatchMclean at [address].   Per ED notes:  04/19/15 After Onalee Hualvarez is going to pass along the patient's information to Dr. Shirlee LatchMcLean, and I encouraged the patient to call Dr. Shirlee LatchMcLean office to schedule the next available appointment. I gave my usual and customary return precautions.

## 2015-04-24 ENCOUNTER — Encounter: Payer: Self-pay | Admitting: Cardiovascular Disease

## 2015-04-24 ENCOUNTER — Ambulatory Visit (INDEPENDENT_AMBULATORY_CARE_PROVIDER_SITE_OTHER): Payer: BC Managed Care – PPO | Admitting: Cardiovascular Disease

## 2015-04-24 VITALS — BP 146/92 | HR 76 | Ht 66.0 in | Wt 194.0 lb

## 2015-04-24 DIAGNOSIS — R0602 Shortness of breath: Secondary | ICD-10-CM | POA: Diagnosis not present

## 2015-04-24 DIAGNOSIS — I471 Supraventricular tachycardia: Secondary | ICD-10-CM

## 2015-04-24 DIAGNOSIS — R7303 Prediabetes: Secondary | ICD-10-CM | POA: Insufficient documentation

## 2015-04-24 DIAGNOSIS — R7309 Other abnormal glucose: Secondary | ICD-10-CM

## 2015-04-24 DIAGNOSIS — R739 Hyperglycemia, unspecified: Secondary | ICD-10-CM

## 2015-04-24 DIAGNOSIS — R0600 Dyspnea, unspecified: Secondary | ICD-10-CM

## 2015-04-24 NOTE — Progress Notes (Signed)
HPI  This is a 69 year old female who is here today to establish cardiovascular care. She was recently diagnosed with paroxysmal supraventricular tachycardia. She was seen by me in 2013 for an episode of tachycardia that resolved by the time she arrived to the emergency room.  48 hour Holter monitor at that time showed only occasional PACs and PVCs. Echocardiogram showed normal LV systolic function, no significant valvular abnormalities and only borderline elevation of pulmonary pressure. Last week she had a prolonged episode of tachycardia which lasted for a few hours and did not resolve. She went to the emergency room at [facility] where she was found to have SVT. She did not respond to vagal maneuvers. She was ultimately given IV adenosine with restoration of normal sinus rhythm. Her labs were significant for a blood sugar of 202 and a troponin of 0.39. She was discharged home on small dose metoprolol to be used as needed.  She has no history of ischemic heart disease. She denies any chest pain. She does have chronic exertional dyspnea. She had a recent UTI and continues to have dry cough. She reports being told about borderline diabetes. She has known history of arthritis status post left hip replacement with significant right knee arthritis. She is not a smoker. Family history is remarkable for coronary artery disease. Her father had myocardial infarction at the age of 12. She reports no further episodes since hospital discharge.  Allergies  Allergen Reactions  . Tape Itching, Rash and Other (See Comments)    Reaction: blisters (adhesive tape)     Current Outpatient Prescriptions on File Prior to Visit  Medication Sig Dispense Refill  . acetaminophen (TYLENOL) 325 MG tablet Take 650 mg by mouth every 6 (six) hours as needed for moderate pain.     . metoprolol tartrate (LOPRESSOR) 25 MG tablet Take 1 tablet PO as needed for rapid heart rate/palpitations (SVT). If symptoms continue after 15  minutes, take 1 additional tablet. Repeat no more frequently than every 8 hours as needed. Do not exceed 6 tablets in 24 hours. 30 tablet 0   No current facility-administered medications on file prior to visit.     Past Medical History  Diagnosis Date  . Obesity, unspecified   . Arthritis   . Pneumonia 2010    hx of  . Joint pain   . Back pain     scoliosis     Past Surgical History  Procedure Laterality Date  . Partial hysterectomy  1990  . Total hip arthroplasty  2006  . Other surgical history  2004    Bilateral cataracts  . Back surgery  1960's    d/t scoliosis-1964/65?  Marland Kitchen Foot surgery  1960's  . Colonoscopy  2007    Dr. Mechele Collin  . Eye surgery  2007    cataracts  . Breast surgery  1990    excision breast mass  . Colonoscopy with esophagogastroduodenoscopy (egd) and esophageal dilation (ed)    . Revision total hip arthroplasty Left 04/17/2014    DR Turner Daniels  . Total hip revision Left 04/17/2014    Procedure: LEFT TOTAL HIP REVISION;  Surgeon: Nestor Lewandowsky, MD;  Location: [facility] OR;  Service: Orthopedics;  Laterality: Left;     Family History  Problem Relation Age of Onset  . Heart attack Father   . Heart disease Father   . Cancer Mother     breast cancer     Social History   Social History  . Marital  Status: Married    Spouse Name: N/A  . Number of Children: N/A  . Years of Education: N/A   Occupational History  . Not on file.   Social History Main Topics  . Smoking status: Never Smoker   . Smokeless tobacco: Never Used  . Alcohol Use: No  . Drug Use: No  . Sexual Activity: Yes    Birth Control/ Protection: Surgical   Other Topics Concern  . Not on file   Social History Narrative     PHYSICAL EXAM   BP 146/92 mmHg  Pulse 76  Ht 5\' 6"  (1.676 m)  Wt 194 lb (87.998 kg)  BMI 31.33 kg/m2  Constitutional: She is oriented to person, place, and time. She appears well-developed and well-nourished. No distress.  HENT: No nasal discharge.  Head:  Normocephalic and atraumatic.  Eyes: Pupils are equal and round. Right eye exhibits no discharge. Left eye exhibits no discharge.  Neck: Normal range of motion. Neck supple. No JVD present. No thyromegaly present.  Cardiovascular: Normal rate, regular rhythm, normal heart sounds. Exam reveals no gallop and no friction rub. No murmur heard.  Pulmonary/Chest: Effort normal and breath sounds normal. No stridor. No respiratory distress. She has no wheezes. She has no rales. She exhibits no tenderness.  Abdominal: Soft. Bowel sounds are normal. She exhibits no distension. There is no tenderness. There is no rebound and no guarding.  Musculoskeletal: Normal range of motion. She exhibits no edema and no tenderness.  Neurological: She is alert and oriented to person, place, and time. Coordination normal.  Skin: Skin is warm and dry. No rash noted. She is not diaphoretic. No erythema. No pallor.  Psychiatric: She has a normal mood and affect. Her behavior is normal. Judgment and thought content normal.    Recent EKG showed no complex tachycardia consistent with AVNRT  ASSESSMENT AND PLAN

## 2015-04-24 NOTE — Assessment & Plan Note (Signed)
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 The patient had recent second episode. Previous episode was in 2013. Thus, her episodes are not very frequent. I discussed with her the natural history and management of SVT. I discussed with her vagal maneuvers and diffuse no improvement she can use metoprolol which was provided. If the episodes become more frequent, she might need to go on metoprolol on a daily basis. Catheter ablation can also be considered.

## 2015-04-24 NOTE — Assessment & Plan Note (Signed)
Recent random blood sugar was 202. This was in the setting of tachycardia. I advised her to follow-up with her primary care physician for further recommendations.

## 2015-04-24 NOTE — Assessment & Plan Note (Signed)
The patient has exertional dyspnea. Recent troponin was elevated in the setting of tachycardia. This was likely due to supply demand ischemia. Nonetheless, she has multiple risk factors for coronary artery disease and given her symptoms, I recommend evaluation with a pharmacologic nuclear stress test. She is not able to exercise on a treadmill due to previous hip replacement.

## 2015-04-24 NOTE — Patient Instructions (Signed)
Medication Instructions:  Your physician recommends that you continue on your current medications as directed. Please refer to the Current Medication list given to you today.   Labwork: none  Testing/Procedures: Your physician has requested that you have a lexiscan myoview. For further information please visit [email or web address]. Please follow instruction sheet, as given. To be done in [place] office.     Follow-Up: Your physician recommends that you schedule a follow-up appointment in: 6 weeks in [place] office.    Any Other Special Instructions Will Be Listed Below (If Applicable).     If you need a refill on your cardiac medications before your next appointment, please call your pharmacy.

## 2015-04-25 ENCOUNTER — Telehealth (HOSPITAL_COMMUNITY): Payer: Self-pay | Admitting: *Deleted

## 2015-04-25 NOTE — Telephone Encounter (Signed)
Patient given detailed instructions per Myocardial Perfusion Study Information Sheet for the test on 04/30/15 at 745. Patient notified to arrive 15 minutes early and that it is imperative to arrive on time for appointment to keep from having the test rescheduled.  If you need to cancel or reschedule your appointment, please call the office within 24 hours of your appointment. Failure to do so may result in a cancellation of your appointment, and a $50 no show fee. Patient verbalized understanding.Antionette CharMary J Khalidah Herbold, RN

## 2015-04-30 ENCOUNTER — Ambulatory Visit (HOSPITAL_COMMUNITY): Payer: BC Managed Care – PPO | Attending: Cardiovascular Disease

## 2015-04-30 DIAGNOSIS — R0602 Shortness of breath: Secondary | ICD-10-CM

## 2015-04-30 DIAGNOSIS — R0609 Other forms of dyspnea: Secondary | ICD-10-CM | POA: Insufficient documentation

## 2015-04-30 DIAGNOSIS — E119 Type 2 diabetes mellitus without complications: Secondary | ICD-10-CM | POA: Insufficient documentation

## 2015-04-30 DIAGNOSIS — R9439 Abnormal result of other cardiovascular function study: Secondary | ICD-10-CM | POA: Diagnosis not present

## 2015-04-30 LAB — MYOCARDIAL PERFUSION IMAGING
CHL CUP NUCLEAR SDS: 3
CHL CUP NUCLEAR SSS: 14
LHR: 0.37
LV sys vol: 15 mL
LVDIAVOL: 71 mL
Peak HR: 117 {beats}/min
Rest HR: 67 {beats}/min
SRS: 11
TID: 1.04

## 2015-04-30 MED ORDER — TECHNETIUM TC 99M SESTAMIBI GENERIC - CARDIOLITE
10.4000 | Freq: Once | INTRAVENOUS | Status: AC | PRN
  Administered 2015-04-30: 10 via INTRAVENOUS

## 2015-04-30 MED ORDER — REGADENOSON 0.4 MG/5ML IV SOLN
0.4000 mg | Freq: Once | INTRAVENOUS | Status: AC
  Administered 2015-04-30: 0.4 mg via INTRAVENOUS

## 2015-04-30 MED ORDER — TECHNETIUM TC 99M SESTAMIBI GENERIC - CARDIOLITE
32.7000 | Freq: Once | INTRAVENOUS | Status: AC | PRN
  Administered 2015-04-30: 32.7 via INTRAVENOUS

## 2015-06-07 ENCOUNTER — Ambulatory Visit
Admission: RE | Admit: 2015-06-07 | Discharge: 2015-06-07 | Disposition: A | Discharge status: Home / Self Care | Payer: BC Managed Care – PPO | Source: Ambulatory Visit | Attending: Internal Medicine | Admitting: Internal Medicine

## 2015-06-07 DIAGNOSIS — E2839 Other primary ovarian failure: Secondary | ICD-10-CM

## 2015-06-07 DIAGNOSIS — Z780 Asymptomatic menopausal state: Secondary | ICD-10-CM | POA: Insufficient documentation

## 2015-06-07 DIAGNOSIS — Z0000 Encounter for general adult medical examination without abnormal findings: Secondary | ICD-10-CM

## 2015-06-07 DIAGNOSIS — Z1231 Encounter for screening mammogram for malignant neoplasm of breast: Secondary | ICD-10-CM | POA: Insufficient documentation

## 2015-06-12 ENCOUNTER — Encounter: Payer: Self-pay | Admitting: Cardiovascular Disease

## 2015-06-12 ENCOUNTER — Ambulatory Visit (INDEPENDENT_AMBULATORY_CARE_PROVIDER_SITE_OTHER): Payer: BC Managed Care – PPO | Admitting: Cardiovascular Disease

## 2015-06-12 VITALS — BP 140/76 | HR 90 | Ht 63.0 in | Wt 194.0 lb

## 2015-06-12 DIAGNOSIS — I471 Supraventricular tachycardia: Secondary | ICD-10-CM | POA: Diagnosis not present

## 2015-06-12 NOTE — Progress Notes (Signed)
HPI  This is a 70 year old female who is here today for a follow-up visit regarding  paroxysmal supraventricular tachycardia. She was seen by me in 2013 for an episode of tachycardia that resolved by the time she arrived to the emergency room.  48 hour Holter monitor at that time showed only occasional PACs and PVCs. Echocardiogram showed normal LV systolic function, no significant valvular abnormalities and only borderline elevation of pulmonary pressure. She had an episode of SVT which required emergency room visit in December 2016 and was terminated with adenosine. Troponin was mildly elevated.  He underwent a nuclear stress test last month which showed no evidence of ischemia with normal ejection fraction.  She has been doing very well and denies any recurrent episodes of palpitations. She has not used metoprolol.    Allergies  Allergen Reactions  . Tape Itching, Rash and Other (See Comments)    Reaction: blisters (adhesive tape)     Current Outpatient Prescriptions on File Prior to Visit  Medication Sig Dispense Refill  . acetaminophen (TYLENOL) 325 MG tablet Take 650 mg by mouth every 6 (six) hours as needed for moderate pain.     Marland Kitchen aspirin 81 MG tablet Take 81 mg by mouth daily.    . metoprolol tartrate (LOPRESSOR) 25 MG tablet Take 1 tablet PO as needed for rapid heart rate/palpitations (SVT). If symptoms continue after 15 minutes, take 1 additional tablet. Repeat no more frequently than every 8 hours as needed. Do not exceed 6 tablets in 24 hours. 30 tablet 0  . Omega-3 Fatty Acids (FISH OIL) 500 MG CAPS Take by mouth daily. MEGA RED BRAND     No current facility-administered medications on file prior to visit.     Past Medical History  Diagnosis Date  . Obesity, unspecified   . Arthritis   . Pneumonia 2010    hx of  . Joint pain   . Back pain     scoliosis     Past Surgical History  Procedure Laterality Date  . Partial hysterectomy  1990  . Total hip  arthroplasty  2006  . Other surgical history  2004    Bilateral cataracts  . Back surgery  1960's    d/t scoliosis-1964/65?  Marland Kitchen Foot surgery  1960's  . Colonoscopy  2007    Dr. Mechele Collin  . Eye surgery  2007    cataracts  . Breast surgery  1990    excision breast mass  . Colonoscopy with esophagogastroduodenoscopy (egd) and esophageal dilation (ed)    . Revision total hip arthroplasty Left 04/17/2014    DR Turner Daniels  . Total hip revision Left 04/17/2014    Procedure: LEFT TOTAL HIP REVISION;  Surgeon: Nestor Lewandowsky, MD;  Location: [facility] OR;  Service: Orthopedics;  Laterality: Left;  . Breast biopsy Right 20+ yrs ago    EXCISIONAL - NEG  . Breast biopsy Left 2008    CORE W/CLIP - NEG     Family History  Problem Relation Age of Onset  . Heart attack Father   . Heart disease Father   . Cancer Mother     breast cancer  . Breast cancer Mother 3     Social History   Social History  . Marital Status: Married    Spouse Name: N/A  . Number of Children: N/A  . Years of Education: N/A   Occupational History  . Not on file.   Social History Main Topics  . Smoking status: Never  Smoker   . Smokeless tobacco: Never Used  . Alcohol Use: No  . Drug Use: No  . Sexual Activity: Yes    Birth Control/ Protection: Surgical   Other Topics Concern  . Not on file   Social History Narrative     PHYSICAL EXAM   BP 140/76 mmHg  Pulse 90  Ht  (1.6 m)  Wt 194 lb (87.998 kg)  BMI 34.37 kg/m2  Constitutional: She is oriented to person, place, and time. She appears well-developed and well-nourished. No distress.  HENT: No nasal discharge.  Head: Normocephalic and atraumatic.  Eyes: Pupils are equal and round. Right eye exhibits no discharge. Left eye exhibits no discharge.  Neck: Normal range of motion. Neck supple. No JVD present. No thyromegaly present.  Cardiovascular: Normal rate, regular rhythm, normal heart sounds. Exam reveals no gallop and no friction rub. No murmur  heard.  Pulmonary/Chest: Effort normal and breath sounds normal. No stridor. No respiratory distress. She has no wheezes. She has no rales. She exhibits no tenderness.  Abdominal: Soft. Bowel sounds are normal. She exhibits no distension. There is no tenderness. There is no rebound and no guarding.  Musculoskeletal: Normal range of motion. She exhibits no edema and no tenderness.  Neurological: She is alert and oriented to person, place, and time. Coordination normal.  Skin: Skin is warm and dry. No rash noted. She is not diaphoretic. No erythema. No pallor.  Psychiatric: She has a normal mood and affect. Her behavior is normal. Judgment and thought content normal.      ASSESSMENT AND PLAN

## 2015-06-12 NOTE — Assessment & Plan Note (Addendum)
No recurrent episodes of SVT. I again discussed with her vagal maneuvers and if not successful she continues metoprolol as needed. If the episodes become more frequent, we can consider placing her on daily metoprolol or considering ablation. I reviewed the recent stress test with her which was overall reassuring and low risk.

## 2015-06-12 NOTE — Patient Instructions (Signed)
Medication Instructions: No changes.   Labwork: None.   Procedures/Testing: None.   Follow-Up: 6 months with Dr. Arida.   Any Additional Special Instructions Will Be Listed Below (If Applicable).     If you need a refill on your cardiac medications before your next appointment, please call your pharmacy.   

## 2015-10-05 IMAGING — CR DG PORTABLE PELVIS
1 series · 1 of 1 positions shown · non-contrast
Comparison: None.

CLINICAL DATA: Left hip replacement

EXAM:
PORTABLE PELVIS 1-2 VIEWS

[AP]
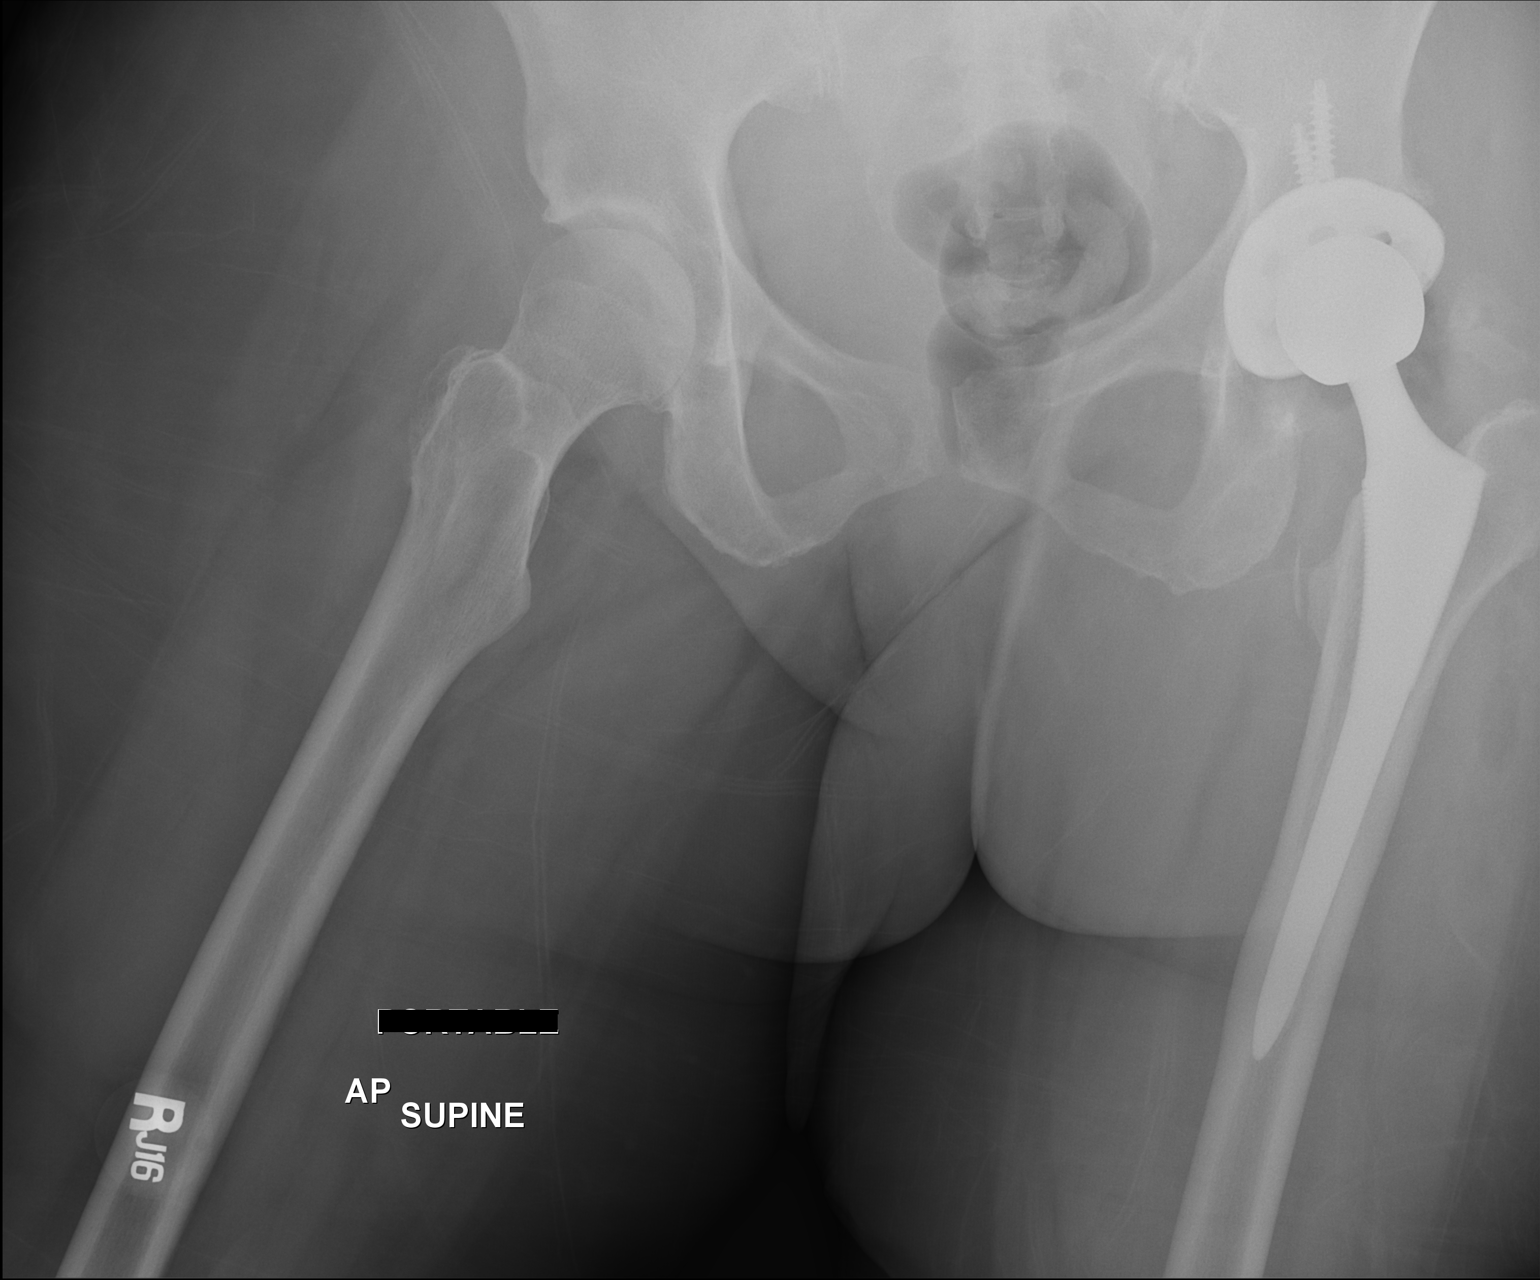

[1 of 1 positions shown; findings below may reference images not displayed]

FINDINGS: Total left hip arthroplasty without failure or complication. Small
sliver of bone adjacent to the medial aspect of the lesser
trochanter. No hardware failure or complication. No fracture or
dislocation.
IMPRESSION: Total left hip arthroplasty.

## 2015-12-14 ENCOUNTER — Encounter: Payer: Self-pay | Admitting: Sports Medicine

## 2015-12-14 ENCOUNTER — Ambulatory Visit (INDEPENDENT_AMBULATORY_CARE_PROVIDER_SITE_OTHER): Payer: Medicare Other | Admitting: Sports Medicine

## 2015-12-14 DIAGNOSIS — M2141 Flat foot [pes planus] (acquired), right foot: Secondary | ICD-10-CM

## 2015-12-14 DIAGNOSIS — M21619 Bunion of unspecified foot: Secondary | ICD-10-CM | POA: Diagnosis not present

## 2015-12-14 DIAGNOSIS — L84 Corns and callosities: Secondary | ICD-10-CM | POA: Diagnosis not present

## 2015-12-14 DIAGNOSIS — M79672 Pain in left foot: Secondary | ICD-10-CM | POA: Diagnosis not present

## 2015-12-14 DIAGNOSIS — M2040 Other hammer toe(s) (acquired), unspecified foot: Secondary | ICD-10-CM

## 2015-12-14 DIAGNOSIS — M2142 Flat foot [pes planus] (acquired), left foot: Secondary | ICD-10-CM

## 2015-12-14 DIAGNOSIS — M79671 Pain in right foot: Secondary | ICD-10-CM

## 2015-12-14 NOTE — Progress Notes (Signed)
Subjective: Kristy Mejia is a 70 y.o. female patient who presents to office for evaluation of  foot pain secondary to callus skin. Patient complains of pain at the calluses especially with shoes. Patient has tried self trimming with no relief in symptoms. Patient denies any other pedal complaints.   Patient Active Problem List   Diagnosis Date Noted  . Paroxysmal supraventricular tachycardia ([place]) 04/24/2015  . Elevated blood sugar 04/24/2015  . Status post revision of total hip 04/17/2014  . 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 Family history of breast cancer 06/22/2013  . Diffuse cystic mastopathy 06/20/2013  . Palpitations 09/25/2011  . Dyspnea 09/25/2011    Current Outpatient Prescriptions on File Prior to Visit  Medication Sig Dispense Refill  . acetaminophen (TYLENOL) 325 MG tablet Take 650 mg by mouth every 6 (six) hours as needed for moderate pain.     Marland Kitchen aspirin 81 MG tablet Take 81 mg by mouth daily.    . metoprolol tartrate (LOPRESSOR) 25 MG tablet Take 1 tablet PO as needed for rapid heart rate/palpitations (SVT). If symptoms continue after 15 minutes, take 1 additional tablet. Repeat no more frequently than every 8 hours as needed. Do not exceed 6 tablets in 24 hours. 30 tablet 0  . Omega-3 Fatty Acids (FISH OIL) 500 MG CAPS Take by mouth daily. MEGA RED BRAND     No current facility-administered medications on file prior to visit.     Allergies  Allergen Reactions  . Tape Itching, Rash and Other (See Comments)    Reaction: blisters (adhesive tape)    Objective:  General: Alert and oriented x3 in no acute distress  Dermatology: Keratotic lesion present Left 4th toe, bilateral sub met 1 and medial hallux with skin lines transversing the lesions, pain is present with direct pressure to the lesion with a central nucleated core noted, no webspace macerations, no ecchymosis bilateral, all nails x 10 are well manicured.  Vascular: Dorsalis Pedis and Posterior  Tibial pedal pulses 1/4, Capillary Fill Time 3 seconds, + pedal hair growth bilateral, no edema bilateral lower extremities, Temperature gradient within normal limits.  Neurology: Johney Maine sensation intact via light touch bilateral.  Musculoskeletal: Mild tenderness with palpation at the keratotic lesion sites, Muscular strength 5/5 in all groups. Severe bunion and hammertoe boney deformity noted.Planus foot type.  Assessment and Plan: Problem List Items Addressed This Visit    None    Visit Diagnoses    Callus of foot    -  Primary   Foot pain, bilateral       Bunion       Hammertoe, unspecified laterality       Pes planus of both feet          -Complete examination performed -Discussed treatment options -Parred keratoic lesions using a chisel blade; treated the area withSalinocaine covered with moleskin -Encouraged daily skin emollients -Encouraged use of pumice stone -Advised good supportive shoes and inserts -Patient to return to office as needed or sooner if condition worsens.  Landis Martins, DPM

## 2016-03-25 ENCOUNTER — Encounter: Payer: Self-pay | Admitting: Cardiovascular Disease

## 2016-03-25 ENCOUNTER — Ambulatory Visit (INDEPENDENT_AMBULATORY_CARE_PROVIDER_SITE_OTHER): Payer: Medicare Other | Admitting: Cardiovascular Disease

## 2016-03-25 VITALS — BP 134/80 | HR 72 | Ht 62.0 in | Wt 186.5 lb

## 2016-03-25 DIAGNOSIS — Z01810 Encounter for preprocedural cardiovascular examination: Secondary | ICD-10-CM

## 2016-03-25 DIAGNOSIS — I471 Supraventricular tachycardia: Secondary | ICD-10-CM

## 2016-03-25 NOTE — Progress Notes (Signed)
Cardiology Office Note   Date:  03/25/2016   ID:  Kristy Mejia, DOB 07-18-1945, MRN [id number]  PCP:  Alva GarnetSHELTON,KIMBERLY R., MD  Cardiologist:   Lorine BearsMuhammad Tynisa Vohs, MD   Chief Complaint  Patient presents with  . other    6 month f/u c/o rapid heart beat in September. Meds reviewed verbally with pt.      History of Present Illness: Kristy Mejia is a 70 y.o. female who presents for follow-up visit regarding  paroxysmal supraventricular tachycardia. She was seen by me in 2013 for an episode of tachycardia that resolved by the time she arrived to the emergency room.  48 hour Holter monitor at that time showed only occasional PACs and PVCs. Echocardiogram showed normal LV systolic function, no significant valvular abnormalities and only borderline elevation of pulmonary pressure. She had an episode of SVT which required emergency room visit in December 2016 and was terminated with adenosine. Troponin was mildly elevated.  He underwent a nuclear stress test last month which showed no evidence of ischemia with normal ejection fraction.   She had one episode of tachycardia since her last visit which happened in September. It lasted for about one hour. She tried vagal maneuvers with no success. She then took 3 tablets of metoprolol within a 15 minute interval. The tachycardia stopped. She felt tired after the episode but since then she had no recurrent tachycardia. She feels back to her baseline   Past Medical History:  Diagnosis Date  . Arthritis   . Back pain    scoliosis  . Joint pain   . Obesity, unspecified   . Pneumonia 2010   hx of    Past Surgical History:  Procedure Laterality Date  . BACK SURGERY  1960's   d/t scoliosis-1964/65?  Marland Kitchen. BREAST BIOPSY Right 20+ yrs ago   EXCISIONAL - NEG  . BREAST BIOPSY Left 2008   CORE W/CLIP - NEG  . BREAST SURGERY  1990   excision breast mass  . COLONOSCOPY  2007   Dr. Mechele CollinElliott  . COLONOSCOPY WITH ESOPHAGOGASTRODUODENOSCOPY (EGD) AND  ESOPHAGEAL DILATION (ED)    . EYE SURGERY  2007   cataracts  . FOOT SURGERY  1960's  . OTHER SURGICAL HISTORY  2004   Bilateral cataracts  . PARTIAL HYSTERECTOMY  1990  . REVISION TOTAL HIP ARTHROPLASTY Left 04/17/2014   DR Turner DanielsOWAN  . TOTAL HIP ARTHROPLASTY  2006  . TOTAL HIP REVISION Left 04/17/2014   Procedure: LEFT TOTAL HIP REVISION;  Surgeon: Nestor LewandowskyFrank J Rowan, MD;  Location: [facility] OR;  Service: Orthopedics;  Laterality: Left;     Current Outpatient Prescriptions  Medication Sig Dispense Refill  . acetaminophen (TYLENOL) 325 MG tablet Take 650 mg by mouth every 6 (six) hours as needed for moderate pain.     Marland Kitchen. aspirin 81 MG tablet Take 81 mg by mouth daily.    . Cholecalciferol (VITAMIN D3) 5000 units CAPS Take by mouth daily.    . metoprolol tartrate (LOPRESSOR) 25 MG tablet Take 1 tablet PO as needed for rapid heart rate/palpitations (SVT). If symptoms continue after 15 minutes, take 1 additional tablet. Repeat no more frequently than every 8 hours as needed. Do not exceed 6 tablets in 24 hours. 30 tablet 0  . Omega-3 Fatty Acids (FISH OIL) 500 MG CAPS Take by mouth daily. MEGA RED BRAND     No current facility-administered medications for this visit.     Allergies:   Tape    Social  History:  The patient  reports that she has never smoked. She has never used smokeless tobacco. She reports that she does not drink alcohol or use drugs.   Family History:  The patient's family history includes Breast cancer (age of onset: 1674) in her mother; Cancer in her mother; Heart attack in her father; Heart disease in her father.    ROS:  Please see the history of present illness.   Otherwise, review of systems are positive for none.   All other systems are reviewed and negative.    PHYSICAL EXAM: VS:  BP 134/80 (BP Location: Left Arm, Patient Position: Sitting, Cuff Size: Normal)   Pulse 72   Ht 5\' 2"  (1.575 m)   Wt 186 lb 8 oz (84.6 kg)   BMI 34.11 kg/m  , BMI Body mass index is 34.11  kg/m. GEN: Well nourished, well developed, in no acute distress  HEENT: normal  Neck: no JVD, carotid bruits, or masses Cardiac: RRR; no murmurs, rubs, or gallops,no edema  Respiratory:  clear to auscultation bilaterally, normal work of breathing GI: soft, nontender, nondistended, + BS MS: no deformity or atrophy  Skin: warm and dry, no rash Neuro:  Strength and sensation are intact Psych: euthymic mood, full affect   EKG:  EKG is ordered today. The ekg ordered today demonstrates normal sinus rhythm with no significant ST or T wave changes.   Recent Labs: 04/19/2015: ALT 24; BUN 17; Creatinine, Ser 0.85; Hemoglobin 14.5; Magnesium 1.8; Platelets 285; Potassium 3.6; Sodium 140    Lipid Panel No results found for: CHOL, TRIG, HDL, CHOLHDL, VLDL, LDLCALC, LDLDIRECT    Wt Readings from Last 3 Encounters:  03/25/16 186 lb 8 oz (84.6 kg)  06/12/15 194 lb (88 kg)  04/30/15 194 lb (88 kg)       No flowsheet data found.    ASSESSMENT AND PLAN:  1.  Paroxysmal supraventricular tachycardia: Only one episode in the last 9 months and she was able to terminate without an emergency room visit. I discussed with her the option of taking metoprolol around-the-clock 25 mg once daily but she really prefers not to take medications. If the episodes become more frequent when with more than 2 episodes per year, it might be worth proceeding with an ablation procedure.  2. Preop cardiovascular evaluation for possible knee replacement: The patient is low risk from a cardiac standpoint. She had a stress test within the last year that was low risk and currently she has no anginal symptoms. Baseline ECG is normal.    Disposition:   FU with me in 9 months  Signed,  Lorine BearsMuhammad Nari Vannatter, MD  03/25/2016 4:16 PM    [facility]

## 2016-03-25 NOTE — Patient Instructions (Signed)
Medication Instructions: No changes.   Labwork: None.   Procedures/Testing: None.   Follow-Up: 9 months with Dr. Kirke CorinArida.   Any Additional Special Instructions Will Be Listed Below (If Applicable).     If you need a refill on your cardiac medications before your next appointment, please call your pharmacy.

## 2016-04-21 ENCOUNTER — Emergency Department: Payer: Medicare Other

## 2016-04-21 ENCOUNTER — Emergency Department
Admission: EM | Admit: 2016-04-21 | Discharge: 2016-04-21 | Disposition: A | Discharge status: Home / Self Care | Payer: Medicare Other | Attending: Emergency Medicine | Admitting: Emergency Medicine

## 2016-04-21 DIAGNOSIS — R079 Chest pain, unspecified: Secondary | ICD-10-CM | POA: Insufficient documentation

## 2016-04-21 DIAGNOSIS — Z79899 Other long term (current) drug therapy: Secondary | ICD-10-CM | POA: Diagnosis not present

## 2016-04-21 LAB — BASIC METABOLIC PANEL
ANION GAP: 6 (ref 5–15)
BUN: 18 mg/dL (ref 6–20)
CHLORIDE: 109 mmol/L (ref 101–111)
CO2: 26 mmol/L (ref 22–32)
Calcium: 9.8 mg/dL (ref 8.9–10.3)
Creatinine, Ser: 0.75 mg/dL (ref 0.44–1.00)
GFR calc Af Amer: >60 mL/min (ref >60)
Glucose, Bld: 112 mg/dL — ABNORMAL HIGH (ref 65–99)
POTASSIUM: 4.3 mmol/L (ref 3.5–5.1)
SODIUM: 141 mmol/L (ref 135–145)

## 2016-04-21 LAB — CBC
HEMATOCRIT: 42 % (ref 35.0–47.0)
HEMOGLOBIN: 14.2 g/dL (ref 12.0–16.0)
MCH: 31.3 pg (ref 26.0–34.0)
MCHC: 33.8 g/dL (ref 32.0–36.0)
MCV: 92.5 fL (ref 80.0–100.0)
Platelets: 208 10*3/uL (ref 150–440)
RBC: 4.54 MIL/uL (ref 3.80–5.20)
RDW: 13.4 % (ref 11.5–14.5)
WBC: 3.8 10*3/uL (ref 3.6–11.0)

## 2016-04-21 LAB — TROPONIN I: Troponin I: <0.03 ng/mL (ref <0.03)

## 2016-04-21 LAB — LIPASE, BLOOD: LIPASE: 19 U/L (ref 11–51)

## 2016-04-21 MED ORDER — GI COCKTAIL ~~LOC~~
30.0000 mL | Freq: Once | ORAL | Status: AC
  Administered 2016-04-21: 30 mL via ORAL
  Filled 2016-04-21: qty 30

## 2016-04-21 MED ORDER — PANTOPRAZOLE SODIUM 40 MG PO TBEC: 40.0000 mg | DELAYED_RELEASE_TABLET | Freq: Every day | ORAL | 1 refills | Status: DC

## 2016-04-21 NOTE — ED Notes (Signed)
Pt alert and oriented X4, active, cooperative, pt in NAD. RR even and unlabored, color WNL.  Pt informed to return if any life threatening symptoms occur.   

## 2016-04-21 NOTE — ED Provider Notes (Signed)
[facility] Emergency Department Provider Note        Time seen: ----------------------------------------- 8:10 AM on 04/21/2016 -----------------------------------------    I have reviewed the triage vital signs and the nursing notes.   HISTORY  Chief Complaint Chest Pain    HPI Suezette Kathie RhodesS Fabio NeighborsKenan is a 70 y.o. female who presents to ER for lower chest pain that radiates under both breasts that feels like cramping or tightness. Patient states started yesterday morning but she slept all day. Patient reports the pain is been going on most of this morning but has not eased up at all. She denies recent illness but states she recently started meloxicam for joint pain.   Past Medical History:  Diagnosis Date  . Arthritis   . Back pain    scoliosis  . Joint pain   . Obesity, unspecified   . Paroxysmal tachycardia ([facility])   . Pneumonia 2010   hx of    Patient Active Problem List   Diagnosis Date Noted  . Paroxysmal supraventricular tachycardia ([facility]) 04/24/2015  . Elevated blood sugar 04/24/2015  . Status post revision of total hip 04/17/2014  . 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 Family history of breast cancer 06/22/2013  . Diffuse cystic mastopathy 06/20/2013  . Palpitations 09/25/2011  . Dyspnea 09/25/2011    Past Surgical History:  Procedure Laterality Date  . BACK SURGERY  1960's   d/t scoliosis-1964/65?  Marland Kitchen. BREAST BIOPSY Right 20+ yrs ago   EXCISIONAL - NEG  . BREAST BIOPSY Left 2008   CORE W/CLIP - NEG  . BREAST SURGERY  1990   excision breast mass  . COLONOSCOPY  2007   Dr. Mechele CollinElliott  . COLONOSCOPY WITH ESOPHAGOGASTRODUODENOSCOPY (EGD) AND ESOPHAGEAL DILATION (ED)    . EYE SURGERY  2007   cataracts  . FOOT SURGERY  1960's  . OTHER SURGICAL HISTORY  2004   Bilateral cataracts  . PARTIAL HYSTERECTOMY  1990  . REVISION TOTAL HIP ARTHROPLASTY Left 04/17/2014   DR Turner DanielsOWAN  . TOTAL HIP ARTHROPLASTY  2006  . TOTAL HIP REVISION  Left 04/17/2014   Procedure: LEFT TOTAL HIP REVISION;  Surgeon: Nestor LewandowskyFrank J Rowan, MD;  Location: [facility] OR;  Service: Orthopedics;  Laterality: Left;    Allergies Tape  Social History Social History  Substance Use Topics  . Smoking status: Never Smoker  . Smokeless tobacco: Never Used  . Alcohol use No    Review of Systems Constitutional: Negative for fever. Cardiovascular: Positive for chest pain Respiratory: Negative for shortness of breath. Gastrointestinal: Negative for abdominal pain, vomiting and diarrhea. Genitourinary: Negative for dysuria. Musculoskeletal: Negative for back pain. Skin: Negative for rash. Neurological: Negative for headaches, focal weakness or numbness.  10-point ROS otherwise negative.  ____________________________________________   PHYSICAL EXAM:  VITAL SIGNS: ED Triage Vitals  Enc Vitals Group     BP 04/21/16 0558 (!) 150/75     Pulse Rate 04/21/16 0558 63     Resp 04/21/16 0558 18     Temp 04/21/16 0558 98.2 F (36.8 C)     Temp Source 04/21/16 0558 Oral     SpO2 04/21/16 0558 100 %     Weight 04/21/16 0600 180 lb (81.6 kg)     Height 04/21/16 0600 5\' 2"  (1.575 m)     Head Circumference --      Peak Flow --      Pain Score 04/21/16 0600 4     Pain Loc --  Pain Edu? --      Excl. in GC? --     Constitutional: Alert and oriented. Well appearing and in no distress. Eyes: Conjunctivae are normal. PERRL. Normal extraocular movements. ENT   Head: Normocephalic and atraumatic.   Nose: No congestion/rhinnorhea.   Mouth/Throat: Mucous membranes are moist.   Neck: No stridor. Cardiovascular: Normal rate, regular rhythm. No murmurs, rubs, or gallops. Respiratory: Normal respiratory effort without tachypnea nor retractions. Breath sounds are clear and equal bilaterally. No wheezes/rales/rhonchi. Gastrointestinal: Soft and nontender. Normal bowel sounds Musculoskeletal: Nontender with normal range of motion in all extremities. No  lower extremity tenderness nor edema. Neurologic:  Normal speech and language. No gross focal neurologic deficits are appreciated.  Skin:  Skin is warm, dry and intact. No rash noted. Psychiatric: Mood and affect are normal. Speech and behavior are normal.  ____________________________________________  EKG: Interpreted by me.Sinus rhythm with a rate of 66 bpm, possible septal infarct age indeterminate, normal QT, normal PR interval, normal axis.  ____________________________________________  ED COURSE:  Pertinent labs & imaging results that were available during my care of the patient were reviewed by me and considered in my medical decision making (see chart for details). Clinical Course   Patient presents to ER for chest pain of uncertain etiology. We will assess with labs and imaging.  Procedures ____________________________________________   LABS (pertinent positives/negatives)  Labs Reviewed  BASIC METABOLIC PANEL - Abnormal; Notable for the following:       Result Value   Glucose, Bld 112 (*)    All other components within normal limits  CBC  TROPONIN I  LIPASE, BLOOD  TROPONIN I    RADIOLOGY Chest x-ray Is unremarkable ____________________________________________  FINAL ASSESSMENT AND PLAN  Chest pain  Plan: Patient with labs and imaging as dictated above. Patient is had 2 negative troponins, will be referred to cardiology for outpatient follow-up. We'll place her on an antacid and advise holding Mobic until follow-up.   Emily FilbertWilliams, Shawnese Magner E, MD   Note: This dictation was prepared with Dragon dictation. Any transcriptional errors that result from this process are unintentional    Emily FilbertJonathan E Marcelene Weidemann, MD 04/21/16 1044

## 2016-04-21 NOTE — ED Triage Notes (Signed)
Pt reports having central chest pain/epigastric pain that feels like "cramping".  Pt states that it started yesterday morning, but that she slept all day.  Pt now reports that the pain has been going on most of this morning and has not eased up at all.  Pt accompanied by husband.  Pt breathing even and nonlabored.  Pt appears in NAD at this time.

## 2016-04-21 NOTE — ED Notes (Signed)
Pt assisted to wheelchair upon arrival; c/o pain to the center of her chest "for several hours"

## 2016-04-24 ENCOUNTER — Ambulatory Visit (INDEPENDENT_AMBULATORY_CARE_PROVIDER_SITE_OTHER): Payer: Medicare Other | Admitting: Physician Assistant

## 2016-04-24 ENCOUNTER — Encounter: Payer: Self-pay | Admitting: Physician Assistant

## 2016-04-24 VITALS — BP 130/80 | HR 75 | Ht 62.0 in | Wt 185.5 lb

## 2016-04-24 DIAGNOSIS — I471 Supraventricular tachycardia: Secondary | ICD-10-CM

## 2016-04-24 DIAGNOSIS — R0789 Other chest pain: Secondary | ICD-10-CM | POA: Diagnosis not present

## 2016-04-24 MED ORDER — RANITIDINE HCL 75 MG PO TABS: 75.0000 mg | ORAL_TABLET | Freq: Every day | ORAL | 6 refills | Status: DC

## 2016-04-24 MED ORDER — METOPROLOL TARTRATE 25 MG PO TABS: 25.0000 mg | ORAL_TABLET | Freq: Two times a day (BID) | ORAL | 6 refills | Status: DC

## 2016-04-24 NOTE — Patient Instructions (Signed)
Medication Instructions:  Please INCREASE your Lopressor to 25 mg twice daily Please START Zantac 75 mg 1-2 tabs daily  Labwork: None  Testing/Procedures: None  Follow-Up: 1 month  If you need a refill on your cardiac medications before your next appointment, please call your pharmacy.

## 2016-04-24 NOTE — Progress Notes (Signed)
**Note Kristy-Identified via Obfuscation** Cardiology Office Note Date:  04/24/2016  Patient ID:  Kristy Mejia, DOB 28-May-1945, MRN [id number] PCP:  Alva GarnetSHELTON,KIMBERLY R., MD  Cardiologist:  Dr. Kirke CorinArida, MD    Chief Complaint: ED follow up for chest pain  History of Present Illness: Kristy Mejia is a 70 y.o. female with history of paroxysmal SVT and obesity who presents for ED follow up of chest pain. She was seen back in 2013 for an episode of tachycardia that resolved by the time she arrived in the ED. 48 hour Holter monitor showed only occasional PACs and PVCs. Echo showed normal LV systolic function, no significant valvular abnormalities and borderline elevation of pulmonary pressure. She had an episode of SVT which required ED visit in 04/2015 that was teminated with adenosine. Troponin was found to be mildly elevated. She underwent a nuclear stress test in 04/2015 that showed no evidence of ischemia with a normal EF. At her last follow up visit with Dr. Kirke CorinArida, MD on 06/12/15 she was doing well without any recurrent palpitations and had not used any metoprolol.   She was seen in the ED on 04/21/16 for chest pain that started in the AM of 12/1 and radiated under both breasts and felt like a cramp/tightness. She reported having recently started Mobic for arthritis. Troponin was negative x 2, lipase 19, cbc unremarkable, SCr 0.75, K+ 4.3, EKG NSR, 66 bpm, normal axis, no acute st/t changes, CXR negative. She was treated with GI cocktail with improvement. She was discharged with Protonix, though has not taken any.    Patient reports to me today she developed substernal/epigastric chest pain that radiates underneath her bilateral breasts on 12/1 after starting Mobic. She stopped Mobic at that time and has not taken any since. She has continued to have this pain without association to exertion. Pain will last 4-5 hours and self resolve for 3-4 hours then return. No associated SOB, palpitations, nausea, vomiting, diaphoresis, presyncope or syncope.  If she has the pain and then gets up to eert herself the pain does not worsen. She does not drink caffeine and denies eating spicy foods. No history of reflux. She is currently without pain.   Secondly, she reports having had 2 recent episodes of SVt (one in September and another in November) that both terminated with 3-4 doses of Lopressor.    Past Medical History:  Diagnosis Date  . Arthritis   . Back pain    scoliosis  . Joint pain   . Obesity, unspecified   . Paroxysmal SVT (supraventricular tachycardia) (HCC)   . Pneumonia 2010   hx of    Past Surgical History:  Procedure Laterality Date  . BACK SURGERY  1960's   d/t scoliosis-1964/65?  Marland Kitchen. BREAST BIOPSY Right 20+ yrs ago   EXCISIONAL - NEG  . BREAST BIOPSY Left 2008   CORE W/CLIP - NEG  . BREAST SURGERY  1990   excision breast mass  . COLONOSCOPY  2007   Dr. Mechele CollinElliott  . COLONOSCOPY WITH ESOPHAGOGASTRODUODENOSCOPY (EGD) AND ESOPHAGEAL DILATION (ED)    . EYE SURGERY  2007   cataracts  . FOOT SURGERY  1960's  . OTHER SURGICAL HISTORY  2004   Bilateral cataracts  . PARTIAL HYSTERECTOMY  1990  . REVISION TOTAL HIP ARTHROPLASTY Left 04/17/2014   DR Turner DanielsOWAN  . TOTAL HIP ARTHROPLASTY  2006  . TOTAL HIP REVISION Left 04/17/2014   Procedure: LEFT TOTAL HIP REVISION;  Surgeon: Nestor LewandowskyFrank J Rowan, MD;  Location: [facility] OR;  Service:  Orthopedics;  Laterality: Left;    Current Outpatient Prescriptions  Medication Sig Dispense Refill  . acetaminophen (TYLENOL) 325 MG tablet Take 650 mg by mouth every 6 (six) hours as needed for moderate pain.     Marland Kitchen aspirin 81 MG tablet Take 81 mg by mouth daily.    . Cholecalciferol (VITAMIN D3) 5000 units CAPS Take by mouth daily.    . meloxicam (MOBIC) 15 MG tablet Take 15 mg by mouth daily.    . metoprolol tartrate (LOPRESSOR) 25 MG tablet Take 1 tablet PO as needed for rapid heart rate/palpitations (SVT). If symptoms continue after 15 minutes, take 1 additional tablet. Repeat no more frequently than  every 8 hours as needed. Do not exceed 6 tablets in 24 hours. 30 tablet 0  . Omega-3 Fatty Acids (FISH OIL) 500 MG CAPS Take by mouth daily. MEGA RED BRAND     No current facility-administered medications for this visit.     Allergies:   Tape   Social History:  The patient  reports that she has never smoked. She has never used smokeless tobacco. She reports that she does not drink alcohol or use drugs.   Family History:  The patient's family history includes Breast cancer (age of onset: 53) in her mother; Cancer in her mother; Heart attack in her father; Heart disease in her father.  ROS:   Review of Systems  Constitutional: Positive for malaise/fatigue. Negative for chills, diaphoresis, fever and weight loss.  HENT: Negative for congestion.   Eyes: Negative for discharge and redness.  Respiratory: Negative for cough, hemoptysis, sputum production, shortness of breath and wheezing.   Cardiovascular: Positive for chest pain and palpitations. Negative for orthopnea, claudication, leg swelling and PND.  Gastrointestinal: Positive for heartburn. Negative for abdominal pain, blood in stool, melena, nausea and vomiting.  Genitourinary: Negative for hematuria.  Musculoskeletal: Negative for falls and myalgias.  Skin: Negative for rash.  Neurological: Negative for dizziness, tingling, tremors, sensory change, speech change, focal weakness, loss of consciousness and weakness.  Endo/Heme/Allergies: Does not bruise/bleed easily.  Psychiatric/Behavioral: Negative for substance abuse. The patient is not nervous/anxious.   All other systems reviewed and are negative.    PHYSICAL EXAM:  VS:  BP 130/80 (BP Location: Left Arm, Patient Position: Sitting, Cuff Size: Normal)   Pulse 75   Ht 5\' 2"  (1.575 m)   Wt 185 lb 8 oz (84.1 kg)   BMI 33.93 kg/m  BMI: Body mass index is 33.93 kg/m.  Physical Exam  Constitutional: She is oriented to person, place, and time. She appears well-developed and  well-nourished.  HENT:  Head: Normocephalic and atraumatic.  Eyes: Right eye exhibits no discharge. Left eye exhibits no discharge.  Neck: Normal range of motion. No JVD present.  Cardiovascular: Normal rate, regular rhythm, S1 normal, S2 normal and normal heart sounds.  Exam reveals no distant heart sounds, no friction rub, no midsystolic click and no opening snap.   No murmur heard. Chest pain is fully reproducible to palpation on exam along the epigastrum.   Pulmonary/Chest: Effort normal and breath sounds normal. No respiratory distress. She has no decreased breath sounds. She has no wheezes. She has no rales. She exhibits no tenderness.  Abdominal: Soft. She exhibits no distension. There is no tenderness.  Musculoskeletal: She exhibits no edema.  Neurological: She is alert and oriented to person, place, and time.  Skin: Skin is warm and dry. No cyanosis. Nails show no clubbing.  Psychiatric: She has a normal mood  and affect. Her speech is normal and behavior is normal. Judgment and thought content normal.     EKG:  Was ordered and interpreted by me today. Shows NSR, 74 bpm, no acute st/t changes  Recent Labs: 04/21/2016: BUN 18; Creatinine, Ser 0.75; Hemoglobin 14.2; Platelets 208; Potassium 4.3; Sodium 141  No results found for requested labs within last 8760 hours.   Estimated Creatinine Clearance: 67.7 mL/min (by C-G formula based on SCr of 0.75 mg/dL).   Wt Readings from Last 3 Encounters:  04/24/16 185 lb 8 oz (84.1 kg)  04/21/16 180 lb (81.6 kg)  03/25/16 186 lb 8 oz (84.6 kg)     Other studies reviewed: Additional studies/records reviewed today include: summarized above  ASSESSMENT AND PLAN:  1. Mostly atypical pain: She notes mixed features with her pain. It is relieved with GI cocktail and reproducible to palpation. Pain is pressure-like though. Currently without pain. Discussed treatment options in detail. She would like to try Zantac 75 mg daily initially , followed  by a 2nd dose if she has recurrence of pain to evaluate if this helps. If she is still symptomatic with this treatment she would then proceed with a cardiac cath. She does not want a cardiac cath at this time. Avoid Mobic. She does not want any further PPI.   2. Paroxysmal SVT: Two recent episodes of SVT that were both terminated with prn Lopressor. She will start taking this daily 25 mg bid to assess if this decreases her SVT burden. If not, consider EP evaluation for possible SVT ablation. She denies any caffeine.   Disposition: F/u with me in 1 month.   Current medicines are reviewed at length with the patient today.  The patient did not have any concerns regarding medicines.  Kristy DodgeSigned, Keith Felten PA-C 04/24/2016 3:00 PM     [facility] [address] [place], [place] [address] [phone]

## 2016-06-02 ENCOUNTER — Ambulatory Visit (INDEPENDENT_AMBULATORY_CARE_PROVIDER_SITE_OTHER): Payer: Medicare Other | Admitting: Cardiovascular Disease

## 2016-06-02 ENCOUNTER — Encounter: Payer: Self-pay | Admitting: Cardiovascular Disease

## 2016-06-02 VITALS — BP 130/70 | HR 63 | Ht 62.0 in | Wt 186.5 lb

## 2016-06-02 DIAGNOSIS — I471 Supraventricular tachycardia: Secondary | ICD-10-CM | POA: Diagnosis not present

## 2016-06-02 DIAGNOSIS — R0789 Other chest pain: Secondary | ICD-10-CM

## 2016-06-02 NOTE — Progress Notes (Signed)
Cardiology Office Note   Date:  06/02/2016   ID:  [name], DOB 03-Jul-1945, MRN [id number]  PCP:  Alva GarnetSHELTON,KIMBERLY R., MD  Cardiologist:   Lorine BearsMuhammad Arida, MD   Chief Complaint  Patient presents with  . ohter    1 month f/u. Meds reviewed verbally with pt.      History of Present Illness: Kristy Mejia is a 71 y.o. female who presents for follow-up visit regarding  paroxysmal supraventricular tachycardia and chest pain. She was seen by me in 2013 for an episode of tachycardia that resolved by the time she arrived to the emergency room.  48 hour Holter monitor at that time showed only occasional PACs and PVCs. Echocardiogram showed normal LV systolic function, no significant valvular abnormalities and only borderline elevation of pulmonary pressure. She had an episode of SVT which required emergency room visit in December 2016 and was terminated with adenosine. Troponin was mildly elevated.  He underwent a nuclear stress test in 04/2015 which showed no evidence of ischemia with normal ejection fraction.   She was seen by Alycia Rossettiyan in September after an emergency room visit for chest pain. The chest pain was felt to be GI in nature after starting Mobic. She stopped taking Mobic for few days and started taking Zantac with complete resolution of symptoms. No recurrent chest pain or shortness of breath. No recurrent tachycardia. She tried to take metoprolol on a regular basis but it made her feel tired. She usually takes metoprolol as needed for tachycardia.   Past Medical History:  Diagnosis Date  . Arthritis   . Back pain    scoliosis  . Joint pain   . Obesity, unspecified   . Paroxysmal SVT (supraventricular tachycardia) (HCC)   . Pneumonia 2010   hx of    Past Surgical History:  Procedure Laterality Date  . BACK SURGERY  1960's   d/t scoliosis-1964/65?  Marland Kitchen. BREAST BIOPSY Right 20+ yrs ago   EXCISIONAL - NEG  . BREAST BIOPSY Left 2008   CORE W/CLIP - NEG  . BREAST SURGERY   1990   excision breast mass  . COLONOSCOPY  2007   Dr. Mechele CollinElliott  . COLONOSCOPY WITH ESOPHAGOGASTRODUODENOSCOPY (EGD) AND ESOPHAGEAL DILATION (ED)    . EYE SURGERY  2007   cataracts  . FOOT SURGERY  1960's  . OTHER SURGICAL HISTORY  2004   Bilateral cataracts  . PARTIAL HYSTERECTOMY  1990  . REVISION TOTAL HIP ARTHROPLASTY Left 04/17/2014   DR Turner DanielsOWAN  . TOTAL HIP ARTHROPLASTY  2006  . TOTAL HIP REVISION Left 04/17/2014   Procedure: LEFT TOTAL HIP REVISION;  Surgeon: Nestor LewandowskyFrank J Rowan, MD;  Location: [facility] OR;  Service: Orthopedics;  Laterality: Left;     Current Outpatient Prescriptions  Medication Sig Dispense Refill  . acetaminophen (TYLENOL) 325 MG tablet Take 650 mg by mouth every 6 (six) hours as needed for moderate pain.     Marland Kitchen. aspirin 81 MG tablet Take 81 mg by mouth daily.    . Cholecalciferol (VITAMIN D3) 5000 units CAPS Take by mouth daily.    . meloxicam (MOBIC) 15 MG tablet Take 15 mg by mouth daily.    . metoprolol tartrate (LOPRESSOR) 25 MG tablet Take 1 tablet (25 mg total) by mouth 2 (two) times daily. 60 tablet 6  . Omega-3 Fatty Acids (FISH OIL) 500 MG CAPS Take by mouth daily. MEGA RED BRAND    . ranitidine (ZANTAC 75) 75 MG tablet Take 1 tablet (75  mg total) by mouth daily. 30 tablet 6   No current facility-administered medications for this visit.     Allergies:   Tape    Social History:  The patient  reports that she has never smoked. She has never used smokeless tobacco. She reports that she does not drink alcohol or use drugs.   Family History:  The patient's family history includes Breast cancer (age of onset: 84) in her mother; Cancer in her mother; Heart attack in her father; Heart disease in her father.    ROS:  Please see the history of present illness.   Otherwise, review of systems are positive for none.   All other systems are reviewed and negative.    PHYSICAL EXAM: VS:  BP 130/70 (BP Location: Left Arm, Patient Position: Sitting, Cuff Size: Normal)    Pulse 63   Ht 5\' 2"  (1.575 m)   Wt 186 lb 8 oz (84.6 kg)   BMI 34.11 kg/m  , BMI Body mass index is 34.11 kg/m. GEN: Well nourished, well developed, in no acute distress  HEENT: normal  Neck: no JVD, carotid bruits, or masses Cardiac: RRR; no murmurs, rubs, or gallops,no edema  Respiratory:  clear to auscultation bilaterally, normal work of breathing GI: soft, nontender, nondistended, + BS MS: no deformity or atrophy  Skin: warm and dry, no rash Neuro:  Strength and sensation are intact Psych: euthymic mood, full affect   EKG:  EKG is ordered today. The ekg ordered today demonstrates normal sinus rhythm with no significant ST or T wave changes.   Recent Labs: 04/21/2016: BUN 18; Creatinine, Ser 0.75; Hemoglobin 14.2; Platelets 208; Potassium 4.3; Sodium 141    Lipid Panel No results found for: CHOL, TRIG, HDL, CHOLHDL, VLDL, LDLCALC, LDLDIRECT    Wt Readings from Last 3 Encounters:  06/02/16 186 lb 8 oz (84.6 kg)  04/24/16 185 lb 8 oz (84.1 kg)  04/21/16 180 lb (81.6 kg)       No flowsheet data found.    ASSESSMENT AND PLAN:  1.  Atypical chest pain likely was GI in nature. Her symptoms resolved after starting Zantac and stopping morbid. She had a stress test done one year ago which was unremarkable. No further testing is recommended.  2. Paroxysmal supraventricular tachycardia:  Taking metoprolol 25 mg twice daily made her very fatigued. I instructed her to take this once daily or to continue using it as needed for tachycardia as she has done in the past. Otherwise we might consider diltiazem or ablation as the episodes become more frequent and symptomatic.    Disposition:   FU with me in 6 months  Signed,  Lorine Bears, MD  06/02/2016 2:16 PM    [facility]

## 2016-06-02 NOTE — Patient Instructions (Signed)
Medication Instructions: Continue same medications.   Labwork: None.   Procedures/Testing: None.   Follow-Up: 6 months with Dr. Anessia Oakland.   Any Additional Special Instructions Will Be Listed Below (If Applicable).     If you need a refill on your cardiac medications before your next appointment, please call your pharmacy.   

## 2016-06-10 ENCOUNTER — Other Ambulatory Visit: Payer: Self-pay | Admitting: Internal Medicine

## 2016-06-10 DIAGNOSIS — Z1231 Encounter for screening mammogram for malignant neoplasm of breast: Secondary | ICD-10-CM

## 2016-07-02 ENCOUNTER — Ambulatory Visit: Payer: Medicare Other

## 2016-07-29 ENCOUNTER — Ambulatory Visit
Admission: RE | Admit: 2016-07-29 | Discharge: 2016-07-29 | Disposition: A | Discharge status: Home / Self Care | Payer: Medicare Other | Source: Ambulatory Visit | Attending: Internal Medicine | Admitting: Internal Medicine

## 2016-07-29 DIAGNOSIS — Z1231 Encounter for screening mammogram for malignant neoplasm of breast: Secondary | ICD-10-CM | POA: Diagnosis not present

## 2017-04-21 ENCOUNTER — Encounter: Payer: Self-pay | Admitting: Internal Medicine

## 2017-04-21 ENCOUNTER — Ambulatory Visit: Payer: Medicare Other | Admitting: Internal Medicine

## 2017-04-21 VITALS — BP 130/90 | HR 67 | Temp 98.2°F | Ht 61.75 in | Wt 182.2 lb

## 2017-04-21 DIAGNOSIS — I471 Supraventricular tachycardia: Secondary | ICD-10-CM

## 2017-04-21 DIAGNOSIS — G8929 Other chronic pain: Secondary | ICD-10-CM | POA: Diagnosis not present

## 2017-04-21 DIAGNOSIS — R002 Palpitations: Secondary | ICD-10-CM

## 2017-04-21 DIAGNOSIS — Z1159 Encounter for screening for other viral diseases: Secondary | ICD-10-CM | POA: Diagnosis not present

## 2017-04-21 DIAGNOSIS — R739 Hyperglycemia, unspecified: Secondary | ICD-10-CM | POA: Diagnosis not present

## 2017-04-21 DIAGNOSIS — E559 Vitamin D deficiency, unspecified: Secondary | ICD-10-CM | POA: Insufficient documentation

## 2017-04-21 DIAGNOSIS — Z1231 Encounter for screening mammogram for malignant neoplasm of breast: Secondary | ICD-10-CM | POA: Diagnosis not present

## 2017-04-21 DIAGNOSIS — M25561 Pain in right knee: Secondary | ICD-10-CM

## 2017-04-21 DIAGNOSIS — Z1329 Encounter for screening for other suspected endocrine disorder: Secondary | ICD-10-CM | POA: Diagnosis not present

## 2017-04-21 DIAGNOSIS — E785 Hyperlipidemia, unspecified: Secondary | ICD-10-CM | POA: Diagnosis not present

## 2017-04-21 DIAGNOSIS — E782 Mixed hyperlipidemia: Secondary | ICD-10-CM | POA: Insufficient documentation

## 2017-04-21 LAB — LIPID PANEL
CHOLESTEROL: 263 mg/dL — AB (ref 0–200)
HDL: 75 mg/dL (ref >39.00)
LDL CALC: 169 mg/dL — AB (ref 0–99)
NonHDL: 187.96
Total CHOL/HDL Ratio: 4
Triglycerides: 95 mg/dL (ref 0.0–149.0)
VLDL: 19 mg/dL (ref 0.0–40.0)

## 2017-04-21 LAB — COMPREHENSIVE METABOLIC PANEL
ALK PHOS: 86 U/L (ref 39–117)
ALT: 17 U/L (ref 0–35)
AST: 22 U/L (ref 0–37)
Albumin: 4.4 g/dL (ref 3.5–5.2)
BILIRUBIN TOTAL: 0.7 mg/dL (ref 0.2–1.2)
BUN: 19 mg/dL (ref 6–23)
CO2: 32 mEq/L (ref 19–32)
CREATININE: 0.69 mg/dL (ref 0.40–1.20)
Calcium: 9.9 mg/dL (ref 8.4–10.5)
Chloride: 104 mEq/L (ref 96–112)
GFR: 108.47 mL/min (ref >60.00)
GLUCOSE: 107 mg/dL — AB (ref 70–99)
Potassium: 4 mEq/L (ref 3.5–5.1)
SODIUM: 142 meq/L (ref 135–145)
TOTAL PROTEIN: 6.9 g/dL (ref 6.0–8.3)

## 2017-04-21 LAB — CBC
HEMATOCRIT: 44 % (ref 36.0–46.0)
Hemoglobin: 14.4 g/dL (ref 12.0–15.0)
MCHC: 32.8 g/dL (ref 30.0–36.0)
MCV: 93.2 fl (ref 78.0–100.0)
Platelets: 220 10*3/uL (ref 150.0–400.0)
RBC: 4.72 Mil/uL (ref 3.87–5.11)
RDW: 14.1 % (ref 11.5–15.5)
WBC: 3.4 10*3/uL — AB (ref 4.0–10.5)

## 2017-04-21 LAB — URINALYSIS, ROUTINE W REFLEX MICROSCOPIC
Bilirubin Urine: NEGATIVE
HGB URINE DIPSTICK: NEGATIVE
Ketones, ur: NEGATIVE
Leukocytes, UA: NEGATIVE
Nitrite: NEGATIVE
PH: 7.5 (ref 5.0–8.0)
RBC / HPF: NONE SEEN (ref 0–?)
SPECIFIC GRAVITY, URINE: 1.02 (ref 1.000–1.030)
Total Protein, Urine: NEGATIVE
Urine Glucose: NEGATIVE
Urobilinogen, UA: 0.2 (ref 0.0–1.0)

## 2017-04-21 LAB — TSH: TSH: 2.58 u[IU]/mL (ref 0.35–4.50)

## 2017-04-21 LAB — VITAMIN D 25 HYDROXY (VIT D DEFICIENCY, FRACTURES): VITD: 41.51 ng/mL (ref 30.00–100.00)

## 2017-04-21 LAB — T4, FREE: Free T4: 0.93 ng/dL (ref 0.60–1.60)

## 2017-04-21 LAB — HEMOGLOBIN A1C: HEMOGLOBIN A1C: 6 % (ref 4.6–6.5)

## 2017-04-21 MED ORDER — PNEUMOCOCCAL 13-VAL CONJ VACC IM SUSP: 0.5000 mL | Freq: Once | INTRAMUSCULAR | 0 refills | Status: AC

## 2017-04-21 NOTE — Progress Notes (Signed)
Chief Complaint  Patient presents with  . Establish Care  . Toe Pain   Establish care Former PCP Dr. Renae GlossShelton will get records  1. She reports 4/10 right knee pain causing abnormal gait and knee is moving inwards. Chronic She wants to see Dr. Turner Danielsowan  2. H/o SVT intermittent sicne 04/19/15 gets 2x per year ? Trigger pt pt last had w/in last 2 months cardiologist is Dr. Kirke CorinArida and she takes Metoprolol 25 mg prn.     Review of Systems  Constitutional: Negative for weight loss.  HENT: Negative for hearing loss.   Eyes:       Denies vision problems   Respiratory: Negative for shortness of breath.   Cardiovascular: Positive for palpitations. Negative for chest pain.  Gastrointestinal: Negative for abdominal pain.  Genitourinary:       No urinary sx's   Musculoskeletal: Positive for joint pain. Negative for falls.  Skin: Negative for rash.  Psychiatric/Behavioral: Negative for depression.   Past Medical History:  Diagnosis Date  . Arthritis   . Back pain    scoliosis  . Chicken pox   . GERD (gastroesophageal reflux disease)   . Hyperlipidemia   . Joint pain   . Obesity, unspecified   . Paroxysmal SVT (supraventricular tachycardia) (HCC)   . Pneumonia 2010   hx of  . Scoliosis    multiple areas of back follows with Dr. Dyke MaesKen Brown chiropractor   . SVT (supraventricular tachycardia) (HCC)    Past Surgical History:  Procedure Laterality Date  . ABDOMINAL HYSTERECTOMY     1990 ovaries intact. had 2/2 fibroids last pap 2004 neg.  ? if cervix present   . BACK SURGERY  1960's   d/t scoliosis-1964/65?  Marland Kitchen. BACK SURGERY     x 3   . BREAST BIOPSY Right 20+ yrs ago   EXCISIONAL - NEG  . BREAST BIOPSY Left 2008   CORE W/CLIP - NEG  . BREAST BIOPSY     2000   . BREAST SURGERY  1990   excision breast mass  . COLONOSCOPY  2007   Dr. Mechele CollinElliott  . COLONOSCOPY WITH ESOPHAGOGASTRODUODENOSCOPY (EGD) AND ESOPHAGEAL DILATION (ED)    . EYE SURGERY  2007   cataracts  . FOOT SURGERY  1960's  .  JOINT REPLACEMENT     total hip left with metal hardward 2016 Dr. Alphonsa Ginowan [facility]   . OTHER SURGICAL HISTORY  2004   Bilateral cataracts  . PARTIAL HYSTERECTOMY  1990  . REVISION TOTAL HIP ARTHROPLASTY Left 04/17/2014   DR Turner DanielsOWAN  . TOTAL HIP ARTHROPLASTY  2006  . TOTAL HIP REVISION Left 04/17/2014   Procedure: LEFT TOTAL HIP REVISION;  Surgeon: Nestor LewandowskyFrank J Rowan, MD;  Location: [facility] OR;  Service: Orthopedics;  Laterality: Left;   Family History  Problem Relation Age of Onset  . Heart attack Father   . Heart disease Father        MI  . Diabetes Father   . Hypertension Father   . Early death Father   . Cancer Mother        breast cancer  . Breast cancer Mother [id number]  . Arthritis Mother   . Dementia Mother   . Aneurysm Brother        cardiac   . Early death Brother    Social History   Socioeconomic History  . Marital status: Married    Spouse name: Not on file  . Number of children: Not on file  . Years  of education: Not on file  . Highest education level: Not on file  Social Needs  . Financial resource strain: Not on file  . Food insecurity - worry: Not on file  . Food insecurity - inability: Not on file  . Transportation needs - medical: Not on file  . Transportation needs - non-medical: Not on file  Occupational History  . Not on file  Tobacco Use  . Smoking status: Never Smoker  . Smokeless tobacco: Never Used  Substance and Sexual Activity  . Alcohol use: No  . Drug use: No  . Sexual activity: Yes    Birth control/protection: Surgical  Other Topics Concern  . 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 COD LIVER OIL PO Take by mouth daily.  . meloxicam (MOBIC) 15 MG tablet Take 15 mg by mouth daily.  . metoprolol tartrate (LOPRESSOR) 25 MG tablet Take 1 tablet (25 mg total) by mouth 2 (two) times daily.  . Omega-3 Fatty  Acids (FISH OIL) 500 MG CAPS Take by mouth daily. MEGA RED BRAND  . OVER THE COUNTER MEDICATION   . ranitidine (ZANTAC 75) 75 MG tablet Take 1 tablet (75 mg total) by mouth daily.  . Wheat Germ Oil OIL Take by mouth daily.  . Wild Yam, Dioscorea villosa, (WILD YAM PO) Take 3 capsules by mouth daily.   Allergies  Allergen Reactions  . No Known Allergies   . Tape Itching, Rash and Other (See Comments)    Reaction: blisters (adhesive tape)   No results found for this or any previous visit (from the past 2160 hour(s)). Objective  Body mass index is 33.6 kg/m. Wt Readings from Last 3 Encounters:  04/21/17 182 lb 4 oz (82.7 kg)  06/02/16 186 lb 8 oz (84.6 kg)  04/24/16 185 lb 8 oz (84.1 kg)   Temp Readings from Last 3 Encounters:  04/21/17 98.2 F (36.8 C) (Oral)  04/21/16 98.2 F (36.8 C) (Oral)  04/19/15 97.8 F (36.6 C) (Oral)   BP Readings from Last 3 Encounters:  04/21/17 130/90  06/02/16 130/70  04/24/16 130/80   Pulse Readings from Last 3 Encounters:  04/21/17 67  06/02/16 63  04/24/16 75   Pulse oximetry on room air is 98% Physical Exam  Constitutional: She is oriented to person, place, and time and well-developed, well-nourished, and in no distress. Vital signs are normal.  HENT:  Head: Normocephalic and atraumatic.  Mouth/Throat: Oropharynx is clear and moist and mucous membranes are normal.  Eyes: Conjunctivae are normal. Pupils are equal, round, and reactive to light.  Cardiovascular: Normal rate, regular rhythm and normal heart sounds.  No murmur heard. Neg leg edema b/l   Pulmonary/Chest: Effort normal and breath sounds normal.  Abdominal: Soft. Bowel sounds are normal. There is no tenderness.  Musculoskeletal:  Scars to back s/p surgery   Neurological: She is alert and oriented to person, place, and time. Gait normal.  Skin: Skin is warm and dry.  Angiomas trunk  Psychiatric: Mood, memory, affect and judgment normal.  Nursing note and vitals  reviewed.  Assessment   1. HLD  2. Right knee pain  3. H/o SVT  4. Hm  5. Hyperglycemia  6. Vit D def   Plan  1. Check labs today CMET, CBC, UA, TSH, T4, lipid, hep  B/C, A1C 2. Refer back to Dr. Alphonsa Ginowan [facility]  3. Refer back to Dr. Kirke CorinArida  Doing metoprolol 25 mg prn not scheduled  4.  Labs as above  Declines flu shot  Given prevnar rx  ? Year Tdap  Disc shingrix today will need in future  Colonoscopy 2016 Dr. Charna ElizabethJyothi Mann need to get records.  mammo 07/29/16 neg referred today for mammo  Will do pap at f/u ? If had cervix s/p hysterectomy  DEXA 06/07/15 normal   Never smoker   Records Dr. Renae GlossShelton   5. Check A1C  6. Check vit D Provider: Dr. French Anaracy McLean-Scocuzza

## 2017-04-21 NOTE — Patient Instructions (Addendum)
We will follow up in 3-4 months for pap  We will refer for mammogram  We will refer to Dr. Kirke CorinArida  We will refer to Dr. Iran Planasowan  [facility] today  Happy Holidays    Supraventricular Tachycardia, Adult Supraventricular tachycardia (SVT) is a kind of abnormal heartbeat. It makes your heart beat very fast and then beat at a normal speed. A normal heart beats 60-100 times a minute. This condition can make your heart beat more than 150 times a minute. Times of having a fast heartbeat (episodes) can be scary, but they are usually not dangerous. They can lead to problems if:  They happen often.  They last a long time.  Symptoms of this condition include:  A pounding heart.  A feeling that your heart is skipping beats (palpitations).  Weakness.  Trouble getting enough air (shortness of breath).  Pain or tightness in your chest.  Feeling like you are going to pass out (light-headedness).  Feeling worried or nervous (anxiety).  Dizziness.  Sweating.  Feeling sick to your stomach (nausea).  Passing out (fainting).  Tiredness.  Sometimes, there are no symptoms. Follow these instructions at home: Stress  Avoid things that make you feel stressed.  Find out what helps you feel less stressed. Try: ? Doing a relaxing activity, like yoga, meditation, or being out in nature. ? Listening to relaxing music. ? Doing relaxation techniques, like deep breathing. ? Taking steps to be healthy. These include getting lots of sleep, exercising, and eating a balanced diet. ? Talking with a mental health doctor. Sleep  Try to get at least 7 hours of sleep each night. Tobacco and nicotine  Do not use anything that has nicotine or tobacco, such as cigarettes and e-cigarettes. If you need help quitting, ask your doctor. Alcohol  If alcohol gives you a fast heartbeat, do not drink alcohol.  If alcohol does not seem to give you a fast heartbeat, limit your alcohol. For nonpregnant women, this means  no more than 1 drink a day. For men, this means no more than 2 drinks a day. "One drink" means one of these: ? 12 oz of beer. ? 5 oz of wine. ? 1 oz of hard liquor. Caffeine  If caffeine gives you a fast heartbeat, do not eat, drink, or use anything with caffeine in it.  If caffeine does not seem to give you a fast heartbeat, limit how much caffeine you eat, drink, or use. Stimulant drugs  Do not use stimulant drugs. These are drugs like cocaine or methamphetamine. If you need help quitting, ask your doctor. General instructions  Stay at a healthy weight.  Exercise regularly. Ask your doctor to suggest some good activities for you. Try one of these options: ? 150 minutes a week of gentle exercise, like walking or yoga. ? 75 minutes a week of exercise that is very active, like running or swimming. ? A combination of gentle exercise and very active exercise.  Do home treatments to slow down your heartbeat as told by your doctor.  Take over-the-counter and prescription medicines only as told by your doctor. Contact a doctor if:  You have a fast heartbeat more often.  Times of having a fast heartbeat last longer than before.  Your home treatments to slow down your heartbeat do not help.  You have new symptoms. Get help right away if:  You have chest pain.  Your symptoms get worse.  You have trouble breathing.  Your heart beats very fast for  more than 20 minutes.  You pass out (faint). These symptoms may be an emergency. Do not wait to see if the symptoms will go away. Get medical help right away. Call your local emergency services (911 in the [place] Do not drive yourself to the hospital. This information is not intended to replace advice given to you by your health care provider. Make sure you discuss any questions you have with your health care provider. Document Released: 05/05/2005 Document Revised: 01/10/2016 Document Reviewed: 01/10/2016 Elsevier Interactive Patient  Education  2017 [employer]

## 2017-04-22 LAB — HEPATITIS B CORE ANTIBODY, TOTAL: Hep B Core Total Ab: NONREACTIVE

## 2017-04-22 LAB — HEPATITIS B SURFACE ANTIBODY, QUANTITATIVE

## 2017-04-22 LAB — HEPATITIS C ANTIBODY
HEP C AB: NONREACTIVE
SIGNAL TO CUT-OFF: 0.02 (ref <1.00)

## 2017-04-22 LAB — HEPATITIS B SURFACE ANTIGEN: Hepatitis B Surface Ag: NONREACTIVE

## 2017-04-30 ENCOUNTER — Telehealth: Payer: Self-pay | Admitting: Internal Medicine

## 2017-04-30 NOTE — Telephone Encounter (Signed)
Order needs to read IMG [id number] 3D TOMO. Please and thank you!

## 2017-05-01 ENCOUNTER — Other Ambulatory Visit: Payer: Self-pay | Admitting: Internal Medicine

## 2017-05-01 DIAGNOSIS — Z1231 Encounter for screening mammogram for malignant neoplasm of breast: Secondary | ICD-10-CM

## 2017-05-04 ENCOUNTER — Encounter: Payer: Self-pay | Admitting: Internal Medicine

## 2017-05-21 ENCOUNTER — Ambulatory Visit: Payer: Medicare Other | Admitting: Nurse Practitioner

## 2017-05-21 ENCOUNTER — Encounter: Payer: Self-pay | Admitting: Nurse Practitioner

## 2017-05-21 VITALS — BP 132/72 | HR 75 | Ht 62.0 in | Wt 182.8 lb

## 2017-05-21 DIAGNOSIS — I471 Supraventricular tachycardia: Secondary | ICD-10-CM

## 2017-05-21 MED ORDER — DILTIAZEM HCL ER COATED BEADS 120 MG PO CP24: 120.0000 mg | ORAL_CAPSULE | Freq: Every day | ORAL | 3 refills | Status: DC

## 2017-05-21 MED ORDER — METOPROLOL TARTRATE 25 MG PO TABS: ORAL_TABLET | ORAL | 6 refills | Status: DC

## 2017-05-21 NOTE — Patient Instructions (Addendum)
Medication Instructions:  Your physician has recommended you make the following change in your medication:  1- TAKE Diltiazem CD 120 mg (1 tablet) by mouth once a day. 2- TAKE Metoprolol 25 mg by mouth AS NEEDED FOR PALPITATIONS.   Labwork: none  Testing/Procedures: none  Follow-Up: Your physician wants you to follow-up in: 6 MONTHS WITH DR ARIDA.  You will receive a reminder letter in the mail two months in advance. If you don't receive a letter, please call our office to schedule the follow-up appointment.   If you need a refill on your cardiac medications before your next appointment, please call your pharmacy.

## 2017-05-21 NOTE — Progress Notes (Signed)
Office Visit    Patient Name: Kristy Mejia Date of Encounter: 05/21/2017  Primary Care Provider:  McLean-Scocuzza, Pasty Spillers, MD Primary Cardiologist:  Lorine Bears, MD  Chief Complaint    72 y/o ? with a history of paroxysmal supraventricular tachycardia, atypical chest pain, hyperlipidemia, GERD, and arthritis, who presents for follow-up.  Past Medical History    Past Medical History:  Diagnosis Date  . Arthritis   . Atypical chest pain    a. 04/2015 Myoview: EF 78%, breast attenuation, no ischemia-->Low risk.  . Back pain    scoliosis  . Chicken pox   . GERD (gastroesophageal reflux disease)   . Hyperlipidemia   . Joint pain   . Obesity, unspecified   . Paroxysmal SVT (supraventricular tachycardia) (HCC)    a. 2013 Holter: PACs/PVCs; b. 10/2011 Ehco: EF nl, no rwma, mild LVH, mild TR, PASP ; c 04/2015 SVT in ED->resolved with adenosine; c.   . Pneumonia 2010   hx of  . Scoliosis    multiple areas of back follows with Dr. Dyke Maes chiropractor    Past Surgical History:  Procedure Laterality Date  . ABDOMINAL HYSTERECTOMY     1990 ovaries intact. had 2/2 fibroids last pap 2004 neg.  ? if cervix present   . BACK SURGERY  1960's   d/t scoliosis-1964/65?  Marland Kitchen BACK SURGERY     x 3   . BREAST BIOPSY Right 20+ yrs ago   EXCISIONAL - NEG  . BREAST BIOPSY Left 2008   CORE W/CLIP - NEG  . BREAST BIOPSY     2000   . BREAST SURGERY  1990   excision breast mass  . COLONOSCOPY  2007   Dr. Mechele Collin  . COLONOSCOPY WITH ESOPHAGOGASTRODUODENOSCOPY (EGD) AND ESOPHAGEAL DILATION (ED)    . EYE SURGERY  2007   cataracts  . FOOT SURGERY  1960's  . JOINT REPLACEMENT     total hip left with metal hardward 2016 Dr. Alphonsa Gin ortho   . OTHER SURGICAL HISTORY  2004   Bilateral cataracts  . PARTIAL HYSTERECTOMY  1990  . REVISION TOTAL HIP ARTHROPLASTY Left 04/17/2014   DR Turner Daniels  . TOTAL HIP ARTHROPLASTY  2006  . TOTAL HIP REVISION Left 04/17/2014   Procedure: LEFT  TOTAL HIP REVISION;  Surgeon: Nestor Lewandowsky, MD;  Location: [facility] OR;  Service: Orthopedics;  Laterality: Left;    Allergies  Allergies  Allergen Reactions  . No Known Allergies   . Tape Itching, Rash and Other (See Comments)    Reaction: blisters (adhesive tape)    History of Present Illness    72 year old female with the above past medical history including paroxysmal supraventricular tachycardia, atypical chest pain, back pain, hyperlipidemia, and GERD.  She was last seen in clinic in January 2018.  In 2013, she was evaluated secondary to an episode of tachycardia that resolved by the time she arrived to the emergency department.  Follow-up 48-hour Holter monitor showed only occasional PACs and PVCs.  Echocardiogram showed normal LV function.  She had recurrent palpitations with finding of SVT in December 2016.  At that time she was treated in the emergency department with adenosine with termination of the rhythm.  Troponin was mildly elevated and she subsequently underwent stress testing which was nonischemic.  Since her last visit in January 2018, she has done reasonably well.  She reports experiencing probably 3 episodes of SVT over the past year, the last of which occurred in October.  Symptoms come on suddenly without any provocation that she is aware of, and last up to 45 minutes.  She typically takes 2, 25 mg metoprolol tablets 15 minutes apart prior to resolution of symptoms.  She has not needed to seek medical attention when these occur.  She has tried vagal maneuvers without success.  Though she feels that episodes are somewhat seldom, she would be interested in background therapy to prevent them from occurring.  She had previously taken metoprolol which resulted in fatigue but is interested in a calcium channel blocker.  She denies chest pain, dyspnea, PND, orthopnea, dizziness, syncope, edema, or early satiety.  She remains relatively active in and around her house without  limitations.  Home Medications    Prior to Admission medications   Medication Sig Start Date End Date Taking? Authorizing Provider  COD LIVER OIL PO Take by mouth daily.   Yes [provider]  meloxicam (MOBIC) 15 MG tablet Take 15 mg by mouth daily.   Yes [provider]  Omega-3 Fatty Acids (FISH OIL) 500 MG CAPS Take by mouth daily. MEGA RED BRAND   Yes [provider]  OVER THE COUNTER MEDICATION    Yes [provider]  ranitidine (ZANTAC 75) 75 MG tablet Take 1 tablet (75 mg total) by mouth daily. 04/24/16  Yes Dunn, Raymon Mutton, PA-C  Wheat Germ Oil OIL Take by mouth daily.   Yes [provider]  Wild Yam, Dioscorea villosa, (WILD YAM PO) Take 3 capsules by mouth daily.   Yes [provider]  diltiazem (CARDIZEM CD) 120 MG 24 hr capsule Take 1 capsule (120 mg total) by mouth daily. 05/21/17   Creig Hines, NP  metoprolol tartrate (LOPRESSOR) 25 MG tablet Take 1 tablet (25 mg) by mouth as needed for palpitations. 05/21/17   Creig Hines, NP    Review of Systems    3 episodes of SVT/tachypalpitations as outlined above.  Otherwise, she denies chest pain, dyspnea, PND, orthopnea, dizziness, syncope, edema, or early satiety.  All other systems reviewed and are otherwise negative except as noted above.  Physical Exam    VS:  BP 132/72 (BP Location: Left Arm, Patient Position: Sitting, Cuff Size: Normal)   Pulse 75   Ht 5\' 2"  (1.575 m)   Wt 182 lb 12 oz (82.9 kg)   BMI 33.43 kg/m  , BMI Body mass index is 33.43 kg/m. GEN: Well nourished, well developed, in no acute distress.  HEENT: normal.  Neck: Supple, no JVD, carotid bruits, or masses. Cardiac: RRR, no murmurs, rubs, or gallops. No clubbing, cyanosis, edema.  Radials/DP/PT 2+ and equal bilaterally.  Respiratory:  Respirations regular and unlabored, clear to auscultation bilaterally. GI: Soft, nontender, nondistended, BS + x 4. MS: no deformity or  atrophy. Skin: warm and dry, no rash. Neuro:  Strength and sensation are intact. Psych: Normal affect.  Accessory Clinical Findings    ECG -regular sinus rhythm, 75, left atrial enlargement, septal infarct, no acute ST or T changes.  Assessment & Plan    1.  Paroxysmal supraventricular tachycardia: Over the past year, patient has had 3 episodes of SVT, the last of which occurring in October.  Symptoms typically last up to about 45 minutes and resolve after taking 2 separate doses of metoprolol 25 mg, usually about 15 minutes apart.  She is somewhat bothered by the unpredictability of symptoms and would be interested in background therapy.  In that setting, I am adding diltiazem CD 120 mg  daily.  She can continue to take metoprolol as needed for breakthrough arrhythmias.  I will plan to have her follow-up in about 6 months to see how she is doing on diltiazem.  2.  History of atypical chest pain: Negative stress testing in 2016.  No recurrence.  3.  Disposition: Follow-up with Dr. Kirke CorinArida in 6 months or sooner if necessary.   Nicolasa Duckinghristopher Jovaun Levene, NP 05/21/2017, 12:03 PM

## 2017-06-06 ENCOUNTER — Encounter: Payer: Self-pay | Admitting: Emergency Medicine

## 2017-06-06 ENCOUNTER — Emergency Department
Admission: EM | Admit: 2017-06-06 | Discharge: 2017-06-07 | Disposition: A | Discharge status: Home / Self Care | Payer: Medicare Other | Attending: Emergency Medicine | Admitting: Emergency Medicine

## 2017-06-06 ENCOUNTER — Other Ambulatory Visit: Payer: Self-pay

## 2017-06-06 DIAGNOSIS — Z96642 Presence of left artificial hip joint: Secondary | ICD-10-CM | POA: Diagnosis not present

## 2017-06-06 DIAGNOSIS — R21 Rash and other nonspecific skin eruption: Secondary | ICD-10-CM | POA: Diagnosis present

## 2017-06-06 DIAGNOSIS — Z79899 Other long term (current) drug therapy: Secondary | ICD-10-CM | POA: Diagnosis not present

## 2017-06-06 MED ORDER — FAMOTIDINE 20 MG PO TABS
40.0000 mg | ORAL_TABLET | Freq: Once | ORAL | Status: AC
  Administered 2017-06-06: 40 mg via ORAL
  Filled 2017-06-06: qty 2

## 2017-06-06 MED ORDER — DIPHENHYDRAMINE HCL 25 MG PO CAPS
25.0000 mg | ORAL_CAPSULE | Freq: Once | ORAL | Status: AC
  Administered 2017-06-06: 25 mg via ORAL
  Filled 2017-06-06: qty 1

## 2017-06-06 MED ORDER — PREDNISONE 20 MG PO TABS
60.0000 mg | ORAL_TABLET | Freq: Once | ORAL | Status: AC
  Administered 2017-06-06: 60 mg via ORAL
  Filled 2017-06-06: qty 3

## 2017-06-06 MED ORDER — DIPHENHYDRAMINE HCL 50 MG/ML IJ SOLN: 25.0000 mg | Freq: Once | INTRAMUSCULAR | Status: DC

## 2017-06-06 NOTE — ED Notes (Signed)
ED Provider at bedside. 

## 2017-06-06 NOTE — ED Notes (Signed)
Pt states that she began taking diltiazem a couple of weeks ago and is unsure if that has caused the rash.

## 2017-06-06 NOTE — ED Provider Notes (Signed)
[facility] Emergency Department Provider Note   ____________________________________________   First MD Initiated Contact with Patient 06/06/17 2338     (approximate)  I have reviewed the triage vital signs and the nursing notes.   HISTORY  Chief Complaint Rash    HPI Kristy Mejia is a 72 y.o. female who comes into the hospital today with a rash.  The patient states that it started last night but it seems to be spreading.  It is very itchy.  The patient states that the rashes under her breasts around her waist between her buttocks and her thighs.  She states it is been very itchy.  She has not started any new medication but in the last couple of weeks she did start taking diltiazem for paroxysmal atrial fibrillation.  She reports that she has not taken anything orally for itching but did get some cortisone 10 which did not help stop the itching.  The patient denies any shortness of breath or swelling.  She is never had anything like this before.  The patient states because it was spreading she wanted to come in and get checked out.  She is here today for evaluation.  Past Medical History:  Diagnosis Date  . Arthritis   . Atypical chest pain    a. 04/2015 Myoview: EF 78%, breast attenuation, no ischemia-->Low risk.  . Back pain    scoliosis  . Chicken pox   . GERD (gastroesophageal reflux disease)   . Hyperlipidemia   . Joint pain   . Obesity, unspecified   . Paroxysmal SVT (supraventricular tachycardia) ([place])    a. 2013 Holter: PACs/PVCs; b. 10/2011 Ehco: EF nl, no rwma, mild LVH, mild TR, PASP 74mmHg; c 04/2015 SVT in ED->resolved with adenosine; c.   . Pneumonia 2010   hx of  . Scoliosis    multiple areas of back follows with Dr. [place] Service chiropractor     Patient Active Problem List   Diagnosis Date Noted  . Vitamin D deficiency 04/21/2017  . Hyperlipidemia 04/21/2017  . Hyperglycemia 04/21/2017  . Paroxysmal supraventricular tachycardia  ([place]) 04/24/2015  . Elevated blood sugar 04/24/2015  . Status post revision of total hip 04/17/2014  . 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 Family history of breast cancer 06/22/2013  . Diffuse cystic mastopathy 06/20/2013  . Palpitations 09/25/2011  . Dyspnea 09/25/2011    Past Surgical History:  Procedure Laterality Date  . ABDOMINAL HYSTERECTOMY     1990 ovaries intact. had 2/2 fibroids last pap 2004 neg.  ? if cervix present   . BACK SURGERY  1960's   d/t scoliosis-1964/65?  Marland Kitchen BACK SURGERY     x 3   . BREAST BIOPSY Right 20+ yrs ago   EXCISIONAL - NEG  . BREAST BIOPSY Left 2008   CORE W/CLIP - NEG  . BREAST BIOPSY     2000   . BREAST SURGERY  1990   excision breast mass  . COLONOSCOPY  2007   Dr. Vira Agar  . COLONOSCOPY WITH ESOPHAGOGASTRODUODENOSCOPY (EGD) AND ESOPHAGEAL DILATION (ED)    . EYE SURGERY  2007   cataracts  . FOOT SURGERY  1960's  . JOINT REPLACEMENT     total hip left with metal hardward 2016 Dr. Judd Lien ortho   . OTHER SURGICAL HISTORY  2004   Bilateral cataracts  . PARTIAL HYSTERECTOMY  1990  . REVISION TOTAL HIP ARTHROPLASTY Left 04/17/2014   DR Mayer Camel  . TOTAL HIP ARTHROPLASTY  2006  . TOTAL HIP REVISION Left 04/17/2014   Procedure: LEFT TOTAL HIP REVISION;  Surgeon: Nestor LewandowskyFrank J Rowan, MD;  Location: [facility] OR;  Service: Orthopedics;  Laterality: Left;    Prior to Admission medications   Medication Sig Start Date End Date Taking? Authorizing Provider  COD LIVER OIL PO Take by mouth daily.    [provider]  diltiazem (CARDIZEM CD) 120 MG 24 hr capsule Take 1 capsule (120 mg total) by mouth daily. 05/21/17   Creig HinesBerge, Christopher Ronald, NP  famotidine (PEPCID) 40 MG tablet Take 1 tablet (40 mg total) by mouth every evening. 06/07/17 06/07/18  Rebecka ApleyWebster, Elmina Hendel P, MD  meloxicam (MOBIC) 15 MG tablet Take 15 mg by mouth daily.    [provider]  metoprolol tartrate (LOPRESSOR) 25 MG tablet Take 1 tablet (25 mg) by mouth as  needed for palpitations. 05/21/17   Creig HinesBerge, Christopher Ronald, NP  Omega-3 Fatty Acids (FISH OIL) 500 MG CAPS Take by mouth daily. MEGA RED BRAND    [provider]  OVER THE COUNTER MEDICATION     [provider]  predniSONE (STERAPRED UNI-PAK 21 TAB) 10 MG (21) TBPK tablet Take 6 tabs on day 1 Take 5 tabs on day 2 Take 4 tabs on day 3 Take 3 tabs on day 4 Take 2 tabs on day 5 Take 1 tab on day 6 06/07/17   Rebecka ApleyWebster, Salathiel Ferrara P, MD  ranitidine (ZANTAC 75) 75 MG tablet Take 1 tablet (75 mg total) by mouth daily. 04/24/16   Sondra Bargesunn, Ryan M, PA-C  Wheat Germ Oil OIL Take by mouth daily.    [provider]  Baxter InternationalWild Yam, Dioscorea villosa, (WILD YAM PO) Take 3 capsules by mouth daily.    [provider]    Allergies No known allergies and Tape  Family History  Problem Relation Age of Onset  . Heart attack Father   . Heart disease Father        MI  . Diabetes Father   . Hypertension Father   . Early death Father   . Cancer Mother        breast cancer  . Breast cancer Mother [id number]  . Arthritis Mother   . Dementia Mother   . Aneurysm Brother        cardiac   . Early death Brother     Social History Social History   Tobacco Use  . Smoking status: Never Smoker  . Smokeless tobacco: Never Used  Substance Use Topics  . Alcohol use: No  . Drug use: No    Review of Systems  Constitutional: No fever/chills Eyes: No visual changes. ENT: No sore throat. Cardiovascular: Denies chest pain. Respiratory: Denies shortness of breath. Gastrointestinal: No abdominal pain.  No nausea, no vomiting.  No diarrhea.  No constipation. Genitourinary: Negative for dysuria. Musculoskeletal: Negative for back pain. Skin:  rash. Neurological: Negative for headaches, focal weakness or numbness.   ____________________________________________   PHYSICAL EXAM:  VITAL SIGNS: ED Triage Vitals  Enc Vitals Group     BP 06/06/17 2329 (!) 158/79     Pulse Rate 06/06/17  2329 70     Resp 06/06/17 2329 20     Temp 06/06/17 2329 98.7 F (37.1 C)     Temp Source 06/06/17 2329 Oral     SpO2 06/06/17 2329 98 %     Weight 06/06/17 2327 182 lb (82.6 kg)     Height 06/06/17 2327 5\' 2"  (1.575 m)  Head Circumference --      Peak Flow --      Pain Score 06/06/17 2327 9     Pain Loc --      Pain Edu? --      Excl. in [place]? --     Constitutional: Alert and oriented. Well appearing and in moderate distress. Eyes: Conjunctivae are normal. PERRL. EOMI. Head: Atraumatic. Nose: No congestion/rhinnorhea. Mouth/Throat: Mucous membranes are moist.  Oropharynx non-erythematous. Cardiovascular: Normal rate, regular rhythm. Grossly normal heart sounds.  Good peripheral circulation. Respiratory: Normal respiratory effort.  No retractions. Lungs CTAB. Gastrointestinal: Soft and nontender. No distention. Positive bowel sounds Musculoskeletal: No lower extremity tenderness nor edema.   Neurologic:  Normal speech and language.  Skin:  Skin is warm, dry and intact.  Maculopapular blanching rash noted underneath breasts and around waist red in appearance Psychiatric: Mood and affect are normal.   ____________________________________________   LABS (all labs ordered are listed, but only abnormal results are displayed)  Labs Reviewed - No data to display ____________________________________________  EKG  none ____________________________________________  RADIOLOGY  No results found.  ____________________________________________   PROCEDURES  Procedure(s) performed: None  Procedures  Critical Care performed: No  ____________________________________________   INITIAL IMPRESSION / ASSESSMENT AND PLAN / ED COURSE  As part of my medical decision making, I reviewed the following data within the electronic MEDICAL RECORD NUMBER Notes from prior ED visits and [place] Controlled Substance Database   This is a 72 year old who comes into the hospital today with a rash  under her breasts and around her waist.  My differential diagnosis includes hives, erythema multiforme, pityriasis rosea  I will give the patient a dose of prednisone, Benadryl and Pepcid.  The patient will be reassessed.    The patient's itching is improved at this time.  I am concerned for possible allergic reaction versus pityriasis rosea.  Since the patient is not having any swelling of her mouth or any shortness of breath I will discharge her to have her follow-up with her primary care physician.  I will have her hold her diltiazem for a couple of days to determine if that may be the cause of her rash.  She should follow-up with her physician in 2 days.  The patient has no further questions or concerns.  ____________________________________________   FINAL CLINICAL IMPRESSION(S) / ED DIAGNOSES  Final diagnoses:  Rash  Rash and nonspecific skin eruption     ED Discharge Orders        Ordered    predniSONE (STERAPRED UNI-PAK 21 TAB) 10 MG (21) TBPK tablet     06/07/17 0055    famotidine (PEPCID) 40 MG tablet  Every evening     06/07/17 0055       Note:  This document was prepared using Dragon voice recognition software and may include unintentional dictation errors.    Loney Hering, MD 06/07/17 [phone]

## 2017-06-06 NOTE — ED Triage Notes (Signed)
Pt arrives ambulatory to triage with rash that started last night. Pt is in NAD.

## 2017-06-06 NOTE — ED Notes (Addendum)
Patient c/o new onset rash to abdomen, chest, and between buttocks.  Patient reports she began taking a new medication (Diltiazem) 2 weeks ago.   Patient denies any known food/drug allergies.

## 2017-06-07 MED ORDER — FAMOTIDINE 40 MG PO TABS: 40.0000 mg | ORAL_TABLET | Freq: Every evening | ORAL | 0 refills | Status: DC

## 2017-06-07 MED ORDER — PREDNISONE 10 MG (21) PO TBPK: ORAL_TABLET | ORAL | 0 refills | Status: DC

## 2017-06-07 NOTE — ED Notes (Signed)
Reviewed discharge instructions, follow-up care, and prescriptions with patient. Patient verbalized understanding of all information reviewed. Patient stable, with no distress noted at this time.    

## 2017-06-07 NOTE — Discharge Instructions (Signed)
There is a possibility that this is due to an allergic reaction.  There is also a possibility that this is a self-limited illness.  Please take the steroids as they have been prescribed and please follow-up with your primary care physician.  If this is a reaction to medication please hold the medicine for the next 2 days to see if that does help with the rash.  Please contact your physician for further evaluation and treatment.

## 2017-06-07 NOTE — ED Notes (Signed)
Registration at bedside.

## 2017-06-07 NOTE — ED Notes (Signed)
ED Provider at bedside. 

## 2017-06-08 ENCOUNTER — Ambulatory Visit: Payer: Self-pay | Admitting: *Deleted

## 2017-06-08 ENCOUNTER — Telehealth: Payer: Self-pay | Admitting: Nurse Practitioner

## 2017-06-08 NOTE — Telephone Encounter (Signed)
Pt reports worsening rash. Onset Friday 06/05/17, went to ED Saturday evening. Placed on prednisone, OTC benadryl. Pt placed on Cardizem 2 weeks ago; pt called cardiologist at onset and told would not be cause of rash. Rash is bright red in color; "blotchy but raised in some areas." Does not blanch. Rash is everywhere "except lower legs." Severe itching, benadryl "helps."  Size of areas range from "pin-head to dime size. Worse at inner thighs." Pt has not taken Cardizem x 2 days per ED MD instructions. Pt instructed to make cardiologist aware.  Denies any other symptoms, no soreness or swelling of throat, tongue, no SOB no fever. Unable to secure appt for today, pt willing to go to [facility]. Flow coordinator Mal Amabile'Brock' called to secure F/U appt.  Appt made for Wednesday. Care Advice given per protocol.   Reason for Disposition . [1] Purple or blood-colored rash (spots or dots) AND [2] no fever AND [3] sounds well to triager  Answer Assessment - Initial Assessment Questions 1. APPEARANCE of RASH: "Describe the rash." (e.g., spots, blisters, raised areas, skin peeling, scaly)    Bright red, some raised areas, no papules 2. SIZE: "How big are the spots?" (e.g., tip of pen, eraser, coin; inches, centimeters)     Range in size from pin-head to dime size. 3. LOCATION: "Where is the rash located?"     "All over now except lower legs. "really bad inner thighs." 4. COLOR: "What color is the rash?" (Note: It is difficult to assess rash color in people with darker-colored skin. When this situation occurs, simply ask the caller to describe what they see.)    Bright red. 5. ONSET: "When did the rash begin?"     Friday AM 06/05/17 6. FEVER: "Do you have a fever?" If so, ask: "What is your temperature, how was it measured, and when did it start?"    No 7. ITCHING: "Does the rash itch?" If so, ask: "How bad is the itch?" (Scale 1-10; or mild, moderate, severe)     9/10, after benadryl 4/10 8. CAUSE: "What do you think is  causing the rash?"     Unsure 9. MEDICATION FACTORS: "Have you started any new medications within the last 2 weeks?" (e.g., antibiotics)      Cardizem. Last took Friday AM. Checked with Cardiologist who does not think it is from med. 10. OTHER SYMPTOMS: "Do you have any other symptoms?" (e.g., dizziness, headache, sore throat, joint pain)       No  Protocols used: RASH OR REDNESS - [facility]

## 2017-06-08 NOTE — Telephone Encounter (Signed)
Call transferred to triage. I spoke with the patient. She reports starting diltiazem on 05/24/17. She developed a rash and itching on 06/05/17 and was seen in the ER for this on 06/06/17.  Her last dose of diltiazem was on 06/05/17 in the morning. She was given benadryl, prednisone, and pepcid to take in the ER. She reports today that this is not improving for her, but actually getting worse. I advised her that if this were related to her medication, she would most likely have had a reaction within the first few days of taking this and she would hopefully be showing some signs of improvement now.  I have advised her to stay off diltiazem and follow up with her PCP as soon as possible.  She voices understanding and will call her PCP for follow up.

## 2017-06-08 NOTE — Telephone Encounter (Signed)
FYI

## 2017-06-08 NOTE — Telephone Encounter (Signed)
Pt thinks she is having a reaction to her Diltiazem. States she has a rash under her breast and moved on her face and now is on her inner thighs and moved up her back. States this started on Friday. Pt was seen in ED on Saturday 1/19. Please call.

## 2017-06-10 ENCOUNTER — Ambulatory Visit: Payer: Medicare Other | Admitting: Internal Medicine

## 2017-06-10 ENCOUNTER — Encounter: Payer: Self-pay | Admitting: Internal Medicine

## 2017-06-10 VITALS — BP 170/94 | HR 92 | Temp 98.5°F | Resp 18 | Ht 62.0 in | Wt 183.2 lb

## 2017-06-10 DIAGNOSIS — T50905D Adverse effect of unspecified drugs, medicaments and biological substances, subsequent encounter: Secondary | ICD-10-CM | POA: Diagnosis not present

## 2017-06-10 DIAGNOSIS — L270 Generalized skin eruption due to drugs and medicaments taken internally: Secondary | ICD-10-CM | POA: Diagnosis not present

## 2017-06-10 DIAGNOSIS — R030 Elevated blood-pressure reading, without diagnosis of hypertension: Secondary | ICD-10-CM | POA: Diagnosis not present

## 2017-06-10 MED ORDER — HYDROXYZINE HCL 25 MG PO TABS: 25.0000 mg | ORAL_TABLET | Freq: Four times a day (QID) | ORAL | 0 refills | Status: DC | PRN

## 2017-06-10 MED ORDER — TRIAMCINOLONE ACETONIDE 0.5 % EX CREA: 1.0000 "application " | TOPICAL_CREAM | Freq: Two times a day (BID) | CUTANEOUS | 0 refills | Status: DC

## 2017-06-10 MED ORDER — HYDROCORTISONE 2.5 % EX CREA: TOPICAL_CREAM | Freq: Two times a day (BID) | CUTANEOUS | 0 refills | Status: DC

## 2017-06-10 NOTE — Patient Instructions (Signed)
Please take Atarax for itching 3-4 x per day  Please use hydrocortisone to face, ears, private area under arms  Please use triamcinolone to other parts of your body  Continue pepcid and prednisone Stop Benadryl and Claritin  Come back in 2 weeks   Drug Rash A drug rash is a change in the color or texture of the skin that is caused by a drug. It can develop minutes, hours, or days after the person takes the drug. What are the causes? This condition is usually caused by a drug allergy. It can also be caused by exposure to sunlight after taking a drug that makes the skin sensitive to light. Drugs that commonly cause rashes include:  Penicillin.  Antibiotic medicines.  Medicines that treat seizures.  Medicines that treat cancer (chemotherapy).  Aspirin and other nonsteroidal anti-inflammatory drugs (NSAIDs).  Injectable dyes that contain iodine.  Insulin.  What are the signs or symptoms? Symptoms of this condition include:  Redness.  Tiny bumps.  Peeling.  Itching.  Itchy welts (hives).  Swelling.  The rash may appear on a small area of skin or all over the body. How is this diagnosed? To diagnose the condition, your health care provider will do a physical exam. He or she may also order tests to find out which drug caused the rash. Tests to find the cause of a rash include:  Skin tests.  Blood tests.  Drug challenge. For this test, you stop taking all of the drugs that you do not need to take, and then you start taking them again by adding back one of the drugs at a time.  How is this treated? A drug rash may be treated with medicines, including:  Antihistamines. These may be given to relieve itching.  An NSAID. This may be given to reduce swelling and treat pain.  A steroid drug. This may be given to reduce swelling.  The rash usually goes away when the person stops taking the drug that caused it. Follow these instructions at home:  Take medicines only as  directed by your health care provider.  Let all of your health care providers know about any drug reactions you have had in the past.  If you have hives, take a cool shower or use a cool compress to relieve itchiness. Contact a health care provider if:  You have a fever.  Your rash is not going away.  Your rash gets worse.  Your rash comes back.  You have wheezing or coughing. Get help right away if:  You start to have breathing problems.  You start to have shortness of breath.  You face or throat starts to swell.  You have severe weakness with dizziness or fainting.  You have chest pain. This information is not intended to replace advice given to you by your health care provider. Make sure you discuss any questions you have with your health care provider. Document Released: 06/12/2004 Document Revised: 10/11/2015 Document Reviewed: 03/01/2014 Elsevier Interactive Patient Education  [employer]2018 [employer] Inc.

## 2017-06-10 NOTE — Progress Notes (Signed)
Chief Complaint  Patient presents with  . Follow-up   Follow up  Pt saw and started on diltiazem CD 120 by cardiology on 05/21/17 which she started taking on 05/24/17 and developed red itchy rash on left upper arm on 06/05/17 that was itchy. She called cardiology thinking it was reaction to the medication b/c this was the only think new she had been on and previously taking prn Metoprolol for SVT. Cardiology clinic did not think this was due to the medication over the phone. Pt ended up going to ED/Urgent care 06/05/17 due to the rash spreading to her face, lips, ears, trunk, lower abdomen with large red distributions. She stopped the medication then and was given prednisone pk 6-5-4-3-2-1 taper and is currently on 30 mg. She was also given Pepcid and has been taking Benadryl 25 mg q6 hours and Claritin which has helped. Benadryl has helped more than Claritin but rash is still diffusely present with itching. She denies throat swelling.    Blood pressure elevated likely 2/2 steroids last visit was 130/90   Review of Systems  Respiratory: Negative for shortness of breath.   Cardiovascular: Negative for chest pain.  Skin: Positive for rash.  Neurological: Negative for headaches.   Past Medical History:  Diagnosis Date  . Arthritis   . Atypical chest pain    a. 04/2015 Myoview: EF 78%, breast attenuation, no ischemia-->Low risk.  . Back pain    scoliosis  . Chicken pox   . GERD (gastroesophageal reflux disease)   . Hyperlipidemia   . Joint pain   . Obesity, unspecified   . Paroxysmal SVT (supraventricular tachycardia) (HCC)    a. 2013 Holter: PACs/PVCs; b. 10/2011 Ehco: EF nl, no rwma, mild LVH, mild TR, PASP ; c 04/2015 SVT in ED->resolved with adenosine; c.   . Pneumonia 2010   hx of  . Scoliosis    multiple areas of back follows with Dr. Dyke Maes chiropractor    Past Surgical History:  Procedure Laterality Date  . ABDOMINAL HYSTERECTOMY     1990 ovaries intact. had 2/2 fibroids  last pap 2004 neg.  ? if cervix present   . BACK SURGERY  1960's   d/t scoliosis-1964/65?  Marland Kitchen BACK SURGERY     x 3   . BREAST BIOPSY Right 20+ yrs ago   EXCISIONAL - NEG  . BREAST BIOPSY Left 2008   CORE W/CLIP - NEG  . BREAST BIOPSY     2000   . BREAST SURGERY  1990   excision breast mass  . COLONOSCOPY  2007   Dr. Mechele Collin  . COLONOSCOPY WITH ESOPHAGOGASTRODUODENOSCOPY (EGD) AND ESOPHAGEAL DILATION (ED)    . EYE SURGERY  2007   cataracts  . FOOT SURGERY  1960's  . JOINT REPLACEMENT     total hip left with metal hardward 2016 Dr. Alphonsa Gin ortho   . OTHER SURGICAL HISTORY  2004   Bilateral cataracts  . PARTIAL HYSTERECTOMY  1990  . REVISION TOTAL HIP ARTHROPLASTY Left 04/17/2014   DR Turner Daniels  . TOTAL HIP ARTHROPLASTY  2006  . TOTAL HIP REVISION Left 04/17/2014   Procedure: LEFT TOTAL HIP REVISION;  Surgeon: Nestor Lewandowsky, MD;  Location: [facility] OR;  Service: Orthopedics;  Laterality: Left;   Family History  Problem Relation Age of Onset  . Heart attack Father   . Heart disease Father        MI  . Diabetes Father   . Hypertension Father   . Early  death Father   . Cancer Mother        breast cancer  . Breast cancer Mother [id number]  . Arthritis Mother   . Dementia Mother   . Aneurysm Brother        cardiac   . Early death Brother    Social History   Socioeconomic History  . Marital status: Married    Spouse name: Not on file  . Number of children: Not on file  . Years of education: Not on file  . Highest education level: Not on file  Social Needs  . Financial resource strain: Not on file  . Food insecurity - worry: Not on file  . Food insecurity - inability: Not on file  . Transportation needs - medical: Not on file  . Transportation needs - non-medical: Not on file  Occupational History  . Not on file  Tobacco Use  . Smoking status: Never Smoker  . Smokeless tobacco: Never Used  Substance and Sexual Activity  . Alcohol use: No  . Drug use: No  . Sexual  activity: Yes    Birth control/protection: Surgical  Other Topics Concern  . 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   Allergies  Allergen Reactions  . Diltiazem     CD formulation likely generalized drug rash/eruption face, trunk, arms   . Tape Itching, Rash and Other (See Comments)    Reaction: blisters (adhesive tape)   Recent Results (from the past 2160 hour(s))  CBC     Status: Abnormal   Collection Time: 04/21/17  8:41 AM  Result Value Ref Range   WBC 3.4 (L) 4.0 - 10.5 K/uL   RBC 4.72 3.87 - 5.11 Mil/uL   Platelets 220.0 150.0 - 400.0 K/uL   Hemoglobin 14.4 12.0 - 15.0 g/dL   HCT 16.144.0 09.636.0 - 04.546.0 %   MCV 93.2 78.0 - 100.0 fl   MCHC 32.8 30.0 - 36.0 g/dL   RDW 40.914.1 81.111.5 - 91.415.5 %  Hemoglobin A1c     Status: None   Collection Time: 04/21/17  8:41 AM  Result Value Ref Range   Hgb A1c MFr Bld 6.0 4.6 - 6.5 %    Comment: Glycemic Control Guidelines for People with Diabetes:Non Diabetic:  <6%Goal of Therapy: <7%Additional Action Suggested:  >8%   Comprehensive metabolic panel     Status: Abnormal   Collection Time: 04/21/17  8:41 AM  Result Value Ref Range   Sodium 142 135 - 145 mEq/L   Potassium 4.0 3.5 - 5.1 mEq/L   Chloride 104 96 - 112 mEq/L   CO2 32 19 - 32 mEq/L   Glucose, Bld 107 (H) 70 - 99 mg/dL   BUN 19 6 - 23 mg/dL   Creatinine, Ser 7.820.69 0.40 - 1.20 mg/dL   Total Bilirubin 0.7 0.2 - 1.2 mg/dL   Alkaline Phosphatase 86 39 - 117 U/L   AST 22 0 - 37 U/L   ALT 17 0 - 35 U/L   Total Protein 6.9 6.0 - 8.3 g/dL   Albumin 4.4 3.5 - 5.2 g/dL   Calcium 9.9 8.4 - 95.610.5 mg/dL   GFR 213.08108.47 >65.78>60.00 mL/min  TSH     Status: None   Collection Time:  04/21/17  8:41 AM  Result Value Ref Range   TSH 2.58 0.35 - 4.50 uIU/mL  Urinalysis,  Routine w reflex microscopic     Status: None   Collection Time: 04/21/17  8:41 AM  Result Value Ref Range   Color, Urine YELLOW Yellow;Lt. Yellow   APPearance CLEAR Clear   Specific Gravity, Urine 1.020 1.000 - 1.030   pH 7.5 5.0 - 8.0   Total Protein, Urine NEGATIVE Negative   Urine Glucose NEGATIVE Negative   Ketones, ur NEGATIVE Negative   Bilirubin Urine NEGATIVE Negative   Hgb urine dipstick NEGATIVE Negative   Urobilinogen, UA 0.2 0.0 - 1.0   Leukocytes, UA NEGATIVE Negative   Nitrite NEGATIVE Negative   WBC, UA 0-2/hpf 0-2/hpf   RBC / HPF none seen 0-2/hpf   Squamous Epithelial / LPF Rare(0-4/hpf) Rare(0-4/hpf)  Lipid panel     Status: Abnormal   Collection Time: 04/21/17  8:41 AM  Result Value Ref Range   Cholesterol 263 (H) 0 - 200 mg/dL    Comment: ATP III Classification       Desirable:  < 200 mg/dL               Borderline High:  200 - 239 mg/dL          High:  > = 960 mg/dL   Triglycerides 45.4 0.0 - 149.0 mg/dL    Comment: Normal:  <098 mg/dLBorderline High:  150 - 199 mg/dL   HDL 11.91 >47.82 mg/dL   VLDL 95.6 0.0 - 21.3 mg/dL   LDL Cholesterol 086 (H) 0 - 99 mg/dL   Total CHOL/HDL Ratio 4     Comment:                Men          Women1/2 Average Risk     3.4          3.3Average Risk          5.0          4.42X Average Risk          9.6          7.13X Average Risk          15.0          11.0                       NonHDL 187.96     Comment: NOTE:  Non-HDL goal should be 30 mg/dL higher than patient's LDL goal (i.e. LDL goal of < 70 mg/dL, would have non-HDL goal of < 100 mg/dL)  T4, free     Status: None   Collection Time: 04/21/17  8:41 AM  Result Value Ref Range   Free T4 0.93 0.60 - 1.60 ng/dL    Comment: Specimens from patients who are undergoing biotin therapy and /or ingesting biotin supplements may contain high levels of biotin.  The higher biotin concentration in these specimens interferes with this Free T4 assay.  Specimens that contain high levels  of  biotin may cause false high results for this Free T4 assay.  Please interpret results in light of the total clinical presentation of the patient.    VITAMIN D 25 Hydroxy (Vit-D Deficiency, Fractures)     Status: None   Collection Time: 04/21/17  8:41 AM  Result Value Ref Range   VITD 41.51 30.00 - 100.00 ng/mL  Hepatitis C antibody     Status: None  Collection Time: 04/21/17  8:41 AM  Result Value Ref Range   Hepatitis C Ab NON-REACTIVE NON-REACTI   SIGNAL TO CUT-OFF 0.02 <1.00  Hepatitis B surface antigen     Status: None   Collection Time: 04/21/17  8:41 AM  Result Value Ref Range   Hepatitis B Surface Ag NON-REACTIVE NON-REACTI  Hepatitis B surface antibody     Status: Abnormal   Collection Time: 04/21/17  8:41 AM  Result Value Ref Range   Hepatitis B-Post <5 (L) > OR = 10 mIU/mL    Comment: . Patient does not have immunity to hepatitis B virus. . For additional information, please refer to http://education.[facility].com/faq/FAQ105 (This link is being provided for informational/ educational purposes only).   Hepatitis B core antibody, total     Status: None   Collection Time: 04/21/17  8:41 AM  Result Value Ref Range   Hep B Core Total Ab NON-REACTIVE NON-REACTI   Objective  Body mass index is 33.52 kg/m. Wt Readings from Last 3 Encounters:  06/10/17 183 lb 4 oz (83.1 kg)  06/06/17 182 lb (82.6 kg)  05/21/17 182 lb 12 oz (82.9 kg)   Temp Readings from Last 3 Encounters:  06/10/17 98.5 F (36.9 C) (Oral)  06/07/17 98.7 F (37.1 C)  04/21/17 98.2 F (36.8 C) (Oral)   BP Readings from Last 3 Encounters:  06/10/17 (!) 170/94  06/07/17 (!) 144/87  05/21/17 132/72   Pulse Readings from Last 3 Encounters:  06/10/17 92  06/07/17 83  05/21/17 75   O2 sat room air 96% Physical Exam  Constitutional: She is oriented to person, place, and time and well-developed, well-nourished, and in no distress.  HENT:  Head: Normocephalic and atraumatic.  Mouth/Throat:  Oropharynx is clear and moist and mucous membranes are normal.  Eyes: Conjunctivae are normal. Pupils are equal, round, and reactive to light.  Cardiovascular: Normal rate and normal heart sounds.  Pulmonary/Chest: Effort normal and breath sounds normal.  Neurological: She is alert and oriented to person, place, and time. Gait normal.  Skin: Skin is warm and dry. Rash noted. There is erythema.     Generalized diffuse pruritic erythematous rash to face, ears, trunk, upper arms and abdomen with some crusting and skin sloughing  Psychiatric: Mood, memory, affect and judgment normal.  Nursing note and vitals reviewed.   Assessment   1. Likely generalized fixed drug eruption vs Gwynneth Albright syndrome 2/2 Diltiazem severe reaction. This can take up to 2 weeks to present as it did in this patient  2. Uncontrolled HTN likely 2/2 steroids Plan  1. Stop benadryl and Claritin  Continue prednisone taper  Add Atarax 25 tid to qid Add HC 2.5 cream bid to face  Add  TMC 0.5 % cream to body not face, groin/axilla/private area F/u in 2 weeks 2. Monitor and f/u in 2 week s Provider: Dr. French Ana McLean-Scocuzza-Internal Medicine

## 2017-06-24 ENCOUNTER — Encounter: Payer: Self-pay | Admitting: Internal Medicine

## 2017-06-24 ENCOUNTER — Ambulatory Visit: Payer: Medicare Other | Admitting: Internal Medicine

## 2017-06-24 VITALS — BP 136/80 | HR 70 | Temp 98.4°F | Ht 62.0 in | Wt 183.2 lb

## 2017-06-24 DIAGNOSIS — I471 Supraventricular tachycardia: Secondary | ICD-10-CM

## 2017-06-24 DIAGNOSIS — R002 Palpitations: Secondary | ICD-10-CM

## 2017-06-24 DIAGNOSIS — R030 Elevated blood-pressure reading, without diagnosis of hypertension: Secondary | ICD-10-CM | POA: Diagnosis not present

## 2017-06-24 DIAGNOSIS — E785 Hyperlipidemia, unspecified: Secondary | ICD-10-CM | POA: Diagnosis not present

## 2017-06-24 MED ORDER — METOPROLOL SUCCINATE ER 25 MG PO TB24: 25.0000 mg | ORAL_TABLET | Freq: Every day | ORAL | 1 refills | Status: DC

## 2017-06-24 NOTE — Progress Notes (Signed)
Pre visit review using our clinic review tool, if applicable. No additional management support is needed unless otherwise documented below in the visit note. 

## 2017-06-24 NOTE — Patient Instructions (Addendum)
Please f/u in 3 months   Palpitations A palpitation is the feeling that your heartbeat is irregular or is faster than normal. It may feel like your heart is fluttering or skipping a beat. Palpitations are usually not a serious problem. They may be caused by many things, including smoking, caffeine, alcohol, stress, and certain medicines. Although most causes of palpitations are not serious, palpitations can be a sign of a serious medical problem. In some cases, you may need further medical evaluation. Follow these instructions at home: Pay attention to any changes in your symptoms. Take these actions to help with your condition:  Avoid the following: ? Caffeinated coffee, tea, soft drinks, diet pills, and energy drinks. ? Chocolate. ? Alcohol.  Do not use any tobacco products, such as cigarettes, chewing tobacco, and e-cigarettes. If you need help quitting, ask your health care provider.  Try to reduce your stress and anxiety. Things that can help you relax include: ? Yoga. ? Meditation. ? Physical activity, such as swimming, jogging, or walking. ? Biofeedback. This is a method that helps you learn to use your mind to control things in your body, such as your heartbeats.  Get plenty of rest and sleep.  Take over-the-counter and prescription medicines only as told by your health care provider.  Keep all follow-up visits as told by your health care provider. This is important.  Contact a health care provider if:  You continue to have a fast or irregular heartbeat after 24 hours.  Your palpitations occur more often. Get help right away if:  You have chest pain or shortness of breath.  You have a severe headache.  You feel dizzy or you faint. This information is not intended to replace advice given to you by your health care provider. Make sure you discuss any questions you have with your health care provider. Document Released: 05/02/2000 Document Revised: 10/08/2015 Document  Reviewed: 01/18/2015 Elsevier Interactive Patient Education  [employer]2018 [employer]

## 2017-06-24 NOTE — Progress Notes (Signed)
Chief Complaint  Patient presents with  . Follow-up   Follow up  1. She reports drug rash to diltiazem is better not using Pepcid, HC 2.5 Cream, TMC Cream, Prednisone, Atarax any longer  2. Elevated BP somewhat improved she reports she has had some 140s at home 3. SVT she has had an episode 1.5 weeks ago, another episode on 2/1 that lasted 2 minutes and an episode yesterday that lasted a few seconds. One day she did have to take prn Metoprolol tartrate 25 mg 2x due to sx's. She felt pulse but did not count how fast her heart was beating.     Review of Systems  Constitutional: Negative for weight loss.  HENT: Negative for hearing loss.   Respiratory: Negative for shortness of breath.   Cardiovascular: Positive for palpitations.  Gastrointestinal: Negative for abdominal pain.       Heartburn intermittently   Skin: Negative for rash.   Past Medical History:  Diagnosis Date  . Arthritis   . Atypical chest pain    a. 04/2015 Myoview: EF 78%, breast attenuation, no ischemia-->Low risk.  . Back pain    scoliosis  . Chicken pox   . GERD (gastroesophageal reflux disease)   . Hyperlipidemia   . Joint pain   . Obesity, unspecified   . Paroxysmal SVT (supraventricular tachycardia) (HCC)    a. 2013 Holter: PACs/PVCs; b. 10/2011 Ehco: EF nl, no rwma, mild LVH, mild TR, PASP 34mmHg; c 04/2015 SVT in ED->resolved with adenosine; c.   . Pneumonia 2010   hx of  . Scoliosis    multiple areas of back follows with Dr. Dyke MaesKen Brown chiropractor    Past Surgical History:  Procedure Laterality Date  . ABDOMINAL HYSTERECTOMY     1990 ovaries intact. had 2/2 fibroids last pap 2004 neg.  ? if cervix present   . BACK SURGERY  1960's   d/t scoliosis-1964/65?  Marland Kitchen. BACK SURGERY     x 3   . BREAST BIOPSY Right 20+ yrs ago   EXCISIONAL - NEG  . BREAST BIOPSY Left 2008   CORE W/CLIP - NEG  . BREAST BIOPSY     2000   . BREAST SURGERY  1990   excision breast mass  . COLONOSCOPY  2007   Dr. Mechele CollinElliott  .  COLONOSCOPY WITH ESOPHAGOGASTRODUODENOSCOPY (EGD) AND ESOPHAGEAL DILATION (ED)    . EYE SURGERY  2007   cataracts  . FOOT SURGERY  1960's  . JOINT REPLACEMENT     total hip left with metal hardward 2016 Dr. Alphonsa Ginowan [facility]   . OTHER SURGICAL HISTORY  2004   Bilateral cataracts  . PARTIAL HYSTERECTOMY  1990  . REVISION TOTAL HIP ARTHROPLASTY Left 04/17/2014   DR Turner DanielsOWAN  . TOTAL HIP ARTHROPLASTY  2006  . TOTAL HIP REVISION Left 04/17/2014   Procedure: LEFT TOTAL HIP REVISION;  Surgeon: Nestor LewandowskyFrank J Rowan, MD;  Location: [facility];  Service: Orthopedics;  Laterality: Left;   Family History  Problem Relation Age of Onset  . Heart attack Father   . Heart disease Father        MI  . Diabetes Father   . Hypertension Father   . Early death Father   . Cancer Mother        breast cancer  . Breast cancer Mother [id number]  . Arthritis Mother   . Dementia Mother   . Aneurysm Brother        cardiac   . Early death Brother  Social History   Socioeconomic History  . Marital status: Married    Spouse name: Not on file  . Number of children: Not on file  . Years of education: Not on file  . Highest education level: Not on file  Social Needs  . Financial resource strain: Not on file  . Food insecurity - worry: Not on file  . Food insecurity - inability: Not on file  . Transportation needs - medical: Not on file  . Transportation needs - non-medical: Not on file  Occupational History  . Not on file  Tobacco Use  . Smoking status: Never Smoker  . Smokeless tobacco: Never Used  Substance and Sexual Activity  . Alcohol use: No  . Drug use: No  . Sexual activity: Yes    Birth control/protection: Surgical  Other Topics Concern  . 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 COD LIVER OIL PO Take by mouth daily.  . meloxicam (MOBIC) 15 MG tablet Take 15  mg by mouth daily.  . metoprolol tartrate (LOPRESSOR) 25 MG tablet Take 1 tablet (25 mg) by mouth as needed for palpitations.  . Omega-3 Fatty Acids (FISH OIL) 500 MG CAPS Take by mouth daily. MEGA RED BRAND  . OVER THE COUNTER MEDICATION   . ranitidine (ZANTAC 75) 75 MG tablet Take 1 tablet (75 mg total) by mouth daily.  . Wheat Germ Oil OIL Take by mouth daily.  . Wild Yam, Dioscorea villosa, (WILD YAM PO) Take 3 capsules by mouth daily.  . [DISCONTINUED] famotidine (PEPCID) 40 MG tablet Take 1 tablet (40 mg total) by mouth every evening.  . [DISCONTINUED] hydrocortisone 2.5 % cream Apply topically 2 (two) times daily. Bid to face and ears, underarms if needed and private area for drug rash  . [DISCONTINUED] hydrOXYzine (ATARAX/VISTARIL) 25 MG tablet Take 1 tablet (25 mg total) by mouth every 6 (six) hours as needed.  . [DISCONTINUED] predniSONE (STERAPRED UNI-PAK 21 TAB) 10 MG (21) TBPK tablet Take 6 tabs on day 1 Take 5 tabs on day 2 Take 4 tabs on day 3 Take 3 tabs on day 4 Take 2 tabs on day 5 Take 1 tab on day 6  . [DISCONTINUED] triamcinolone cream (KENALOG) 0.5 % Apply 1 application topically 2 (two) times daily. To body x 2 weeks as needed   Allergies  Allergen Reactions  . Diltiazem     CD formulation likely generalized drug rash/eruption face, trunk, arms   . Tape Itching, Rash and Other (See Comments)    Reaction: blisters (adhesive tape)   Recent Results (from the past 2160 hour(s))  CBC     Status: Abnormal   Collection Time: 04/21/17  8:41 AM  Result Value Ref Range   WBC 3.4 (L) 4.0 - 10.5 K/uL   RBC 4.72 3.87 - 5.11 Mil/uL   Platelets 220.0 150.0 - 400.0 K/uL   Hemoglobin 14.4 12.0 - 15.0 g/dL   HCT 16.1 09.6 - 04.5 %   MCV 93.2 78.0 - 100.0 fl   MCHC 32.8 30.0 - 36.0 g/dL   RDW 40.9 81.1 - 91.4 %  Hemoglobin A1c     Status: None   Collection Time: 04/21/17  8:41 AM  Result Value Ref Range   Hgb A1c MFr Bld 6.0 4.6 - 6.5 %  Comment: Glycemic Control  Guidelines for People with Diabetes:Non Diabetic:  <6%Goal of Therapy: <7%Additional Action Suggested:  >8%   Comprehensive metabolic panel     Status: Abnormal   Collection Time: 04/21/17  8:41 AM  Result Value Ref Range   Sodium 142 135 - 145 mEq/L   Potassium 4.0 3.5 - 5.1 mEq/L   Chloride 104 96 - 112 mEq/L   CO2 32 19 - 32 mEq/L   Glucose, Bld 107 (H) 70 - 99 mg/dL   BUN 19 6 - 23 mg/dL   Creatinine, Ser 6.96 0.40 - 1.20 mg/dL   Total Bilirubin 0.7 0.2 - 1.2 mg/dL   Alkaline Phosphatase 86 39 - 117 U/L   AST 22 0 - 37 U/L   ALT 17 0 - 35 U/L   Total Protein 6.9 6.0 - 8.3 g/dL   Albumin 4.4 3.5 - 5.2 g/dL   Calcium 9.9 8.4 - 29.5 mg/dL   GFR 284.13 >24.40 mL/min  TSH     Status: None   Collection Time: 04/21/17  8:41 AM  Result Value Ref Range   TSH 2.58 0.35 - 4.50 uIU/mL  Urinalysis, Routine w reflex microscopic     Status: None   Collection Time: 04/21/17  8:41 AM  Result Value Ref Range   Color, Urine YELLOW Yellow;Lt. Yellow   APPearance CLEAR Clear   Specific Gravity, Urine 1.020 1.000 - 1.030   pH 7.5 5.0 - 8.0   Total Protein, Urine NEGATIVE Negative   Urine Glucose NEGATIVE Negative   Ketones, ur NEGATIVE Negative   Bilirubin Urine NEGATIVE Negative   Hgb urine dipstick NEGATIVE Negative   Urobilinogen, UA 0.2 0.0 - 1.0   Leukocytes, UA NEGATIVE Negative   Nitrite NEGATIVE Negative   WBC, UA 0-2/hpf 0-2/hpf   RBC / HPF none seen 0-2/hpf   Squamous Epithelial / LPF Rare(0-4/hpf) Rare(0-4/hpf)  Lipid panel     Status: Abnormal   Collection Time: 04/21/17  8:41 AM  Result Value Ref Range   Cholesterol 263 (H) 0 - 200 mg/dL    Comment: ATP III Classification       Desirable:  < 200 mg/dL               Borderline High:  200 - 239 mg/dL          High:  > = 102 mg/dL   Triglycerides 72.5 0.0 - 149.0 mg/dL    Comment: Normal:  <366 mg/dLBorderline High:  150 - 199 mg/dL   HDL 44.03 >47.42 mg/dL   VLDL 59.5 0.0 - 63.8 mg/dL   LDL Cholesterol 756 (H) 0 - 99 mg/dL    Total CHOL/HDL Ratio 4     Comment:                Men          Women1/2 Average Risk     3.4          3.3Average Risk          5.0          4.42X Average Risk          9.6          7.13X Average Risk          15.0          11.0                       NonHDL 187.96  Comment: NOTE:  Non-HDL goal should be 30 mg/dL higher than patient's LDL goal (i.e. LDL goal of < 70 mg/dL, would have non-HDL goal of < 100 mg/dL)  T4, free     Status: None   Collection Time: 04/21/17  8:41 AM  Result Value Ref Range   Free T4 0.93 0.60 - 1.60 ng/dL    Comment: Specimens from patients who are undergoing biotin therapy and /or ingesting biotin supplements may contain high levels of biotin.  The higher biotin concentration in these specimens interferes with this Free T4 assay.  Specimens that contain high levels  of biotin may cause false high results for this Free T4 assay.  Please interpret results in light of the total clinical presentation of the patient.    VITAMIN D 25 Hydroxy (Vit-D Deficiency, Fractures)     Status: None   Collection Time: 04/21/17  8:41 AM  Result Value Ref Range   VITD 41.51 30.00 - 100.00 ng/mL  Hepatitis C antibody     Status: None   Collection Time: 04/21/17  8:41 AM  Result Value Ref Range   Hepatitis C Ab NON-REACTIVE NON-REACTI   SIGNAL TO CUT-OFF 0.02 <1.00  Hepatitis B surface antigen     Status: None   Collection Time: 04/21/17  8:41 AM  Result Value Ref Range   Hepatitis B Surface Ag NON-REACTIVE NON-REACTI  Hepatitis B surface antibody     Status: Abnormal   Collection Time: 04/21/17  8:41 AM  Result Value Ref Range   Hepatitis B-Post <5 (L) > OR = 10 mIU/mL    Comment: . Patient does not have immunity to hepatitis B virus. . For additional information, please refer to http://education.questdiagn[facility].com/faq/FAQ105 (This link is being provided for informational/ educational purposes only).   Hepatitis B core antibody, total     Status: None   Collection  Time: 04/21/17  8:41 AM  Result Value Ref Range   Hep B Core Total Ab NON-REACTIVE NON-REACTI   Objective  Body mass index is 33.51 kg/m. Wt Readings from Last 3 Encounters:  06/24/17 183 lb 3.2 oz (83.1 kg)  06/10/17 183 lb 4 oz (83.1 kg)  06/06/17 182 lb (82.6 kg)   Temp Readings from Last 3 Encounters:  06/24/17 98.4 F (36.9 C) (Oral)  06/10/17 98.5 F (36.9 C) (Oral)  06/07/17 98.7 F (37.1 C)   BP Readings from Last 3 Encounters:  06/24/17 136/80  06/10/17 (!) 170/94  06/07/17 (!) 144/87   Pulse Readings from Last 3 Encounters:  06/24/17 70  06/10/17 92  06/07/17 83   O2 sat room air 96%  Physical Exam  Constitutional: She is oriented to person, place, and time and well-developed, well-nourished, and in no distress. Vital signs are normal.  HENT:  Head: Normocephalic and atraumatic.  Mouth/Throat: Oropharynx is clear and moist and mucous membranes are normal.  Eyes: Conjunctivae are normal. Pupils are equal, round, and reactive to light.  Cardiovascular: Normal rate, regular rhythm and normal heart sounds.  Pulmonary/Chest: Effort normal and breath sounds normal.  Neurological: She is alert and oriented to person, place, and time. Gait normal. Gait normal.  Skin: Skin is warm, dry and intact.  Drug rash much improved still hyperpigmented spots to trunk but face/ears improved   Psychiatric: Mood, memory, affect and judgment normal.  Nursing note and vitals reviewed.   Assessment   1. Drug rash to diltiazem improved  2. SVT intermittently  3. Elevated  BP with likely HTN/ho HLD 4. HM  Plan  1.  Avoid Cardizem in the future  2. Trial of Toprol XL 25 mg qd and tartrate dose 25 mg prn  3. Trial of Toprol XL 25 mg qd  Disc at f/u cholesterol medication  4.  Declines flu shot  Given prevnar rx  ? Year Tdap  Disc shingrix prior appt  Consider hep B vaccine not immune  Hep C neg   Colonoscopy 2016 Dr. Charna Elizabeth need to get records.  mammo 07/29/16 neg  and mammo sch 07/2017  Will consider do pap at f/u ? If had cervix s/p hysterectomy  DEXA 06/07/15 normal  Will need to repeat CBC in future  A1C 6.0 04/21/17   Never smoker   Provider: Dr. French Ana McLean-Scocuzza-[facility]

## 2017-07-30 ENCOUNTER — Ambulatory Visit
Admission: RE | Admit: 2017-07-30 | Discharge: 2017-07-30 | Disposition: A | Discharge status: Home / Self Care | Payer: Medicare Other | Source: Ambulatory Visit | Attending: Internal Medicine | Admitting: Internal Medicine

## 2017-07-30 DIAGNOSIS — Z1231 Encounter for screening mammogram for malignant neoplasm of breast: Secondary | ICD-10-CM | POA: Diagnosis present

## 2017-08-20 ENCOUNTER — Ambulatory Visit: Payer: Medicare Other | Admitting: Internal Medicine

## 2017-09-18 ENCOUNTER — Encounter: Payer: Self-pay | Admitting: Internal Medicine

## 2017-09-18 ENCOUNTER — Ambulatory Visit: Payer: Medicare Other | Admitting: Internal Medicine

## 2017-09-18 VITALS — BP 130/78 | HR 70 | Temp 98.0°F | Ht 62.0 in | Wt 185.8 lb

## 2017-09-18 DIAGNOSIS — M25561 Pain in right knee: Secondary | ICD-10-CM | POA: Diagnosis not present

## 2017-09-18 DIAGNOSIS — M25562 Pain in left knee: Secondary | ICD-10-CM

## 2017-09-18 DIAGNOSIS — D72819 Decreased white blood cell count, unspecified: Secondary | ICD-10-CM | POA: Diagnosis not present

## 2017-09-18 DIAGNOSIS — Z0184 Encounter for antibody response examination: Secondary | ICD-10-CM

## 2017-09-18 DIAGNOSIS — I471 Supraventricular tachycardia: Secondary | ICD-10-CM

## 2017-09-18 DIAGNOSIS — E785 Hyperlipidemia, unspecified: Secondary | ICD-10-CM

## 2017-09-18 LAB — CBC WITH DIFFERENTIAL/PLATELET
Basophils Absolute: 0.1 10*3/uL (ref 0.0–0.1)
Basophils Relative: 2.3 % (ref 0.0–3.0)
EOS ABS: 0.2 10*3/uL (ref 0.0–0.7)
Eosinophils Relative: 6.3 % — ABNORMAL HIGH (ref 0.0–5.0)
HCT: 41 % (ref 36.0–46.0)
HEMOGLOBIN: 13.7 g/dL (ref 12.0–15.0)
Lymphocytes Relative: 31.3 % (ref 12.0–46.0)
Lymphs Abs: 1.1 10*3/uL (ref 0.7–4.0)
MCHC: 33.4 g/dL (ref 30.0–36.0)
MCV: 91.1 fl (ref 78.0–100.0)
MONO ABS: 0.5 10*3/uL (ref 0.1–1.0)
Monocytes Relative: 13 % — ABNORMAL HIGH (ref 3.0–12.0)
Neutro Abs: 1.7 10*3/uL (ref 1.4–7.7)
Neutrophils Relative %: 47.1 % (ref 43.0–77.0)
Platelets: 220 10*3/uL (ref 150.0–400.0)
RBC: 4.5 Mil/uL (ref 3.87–5.11)
RDW: 13.7 % (ref 11.5–15.5)
WBC: 3.6 10*3/uL — AB (ref 4.0–10.5)

## 2017-09-18 MED ORDER — METOPROLOL TARTRATE 25 MG PO TABS: ORAL_TABLET | ORAL | 0 refills | Status: DC

## 2017-09-18 NOTE — Progress Notes (Signed)
Chief Complaint  Patient presents with  . Gynecologic Exam   F/u  1. HLD pt does not want to start medications now  2. Will do pelvic to see if pt has cervix s/p hysterectomy no h/o abnormal pap has been a while since pelvic  3. Pending TKR right Dr. Mayer Camel this summer  4. SVT toprol XL 25 mg qam make sleepy so wants to know if should take tartrate prn or what to do    Review of Systems  Constitutional: Negative for weight loss.  HENT: Negative for hearing loss.   Eyes: Negative for blurred vision.  Respiratory: Negative for shortness of breath.   Cardiovascular: Negative for chest pain.  Gastrointestinal: Negative for abdominal pain.  Musculoskeletal: Positive for joint pain.  Skin: Negative for rash.  Neurological: Negative for headaches.  Psychiatric/Behavioral: Negative for depression.   Past Medical History:  Diagnosis Date  . Arthritis   . Atypical chest pain    a. 04/2015 Myoview: EF 78%, breast attenuation, no ischemia-->Low risk.  . Back pain    scoliosis  . Chicken pox   . GERD (gastroesophageal reflux disease)   . Hyperlipidemia   . Joint pain   . Obesity, unspecified   . Paroxysmal SVT (supraventricular tachycardia) ([place])    a. 2013 Holter: PACs/PVCs; b. 10/2011 Ehco: EF nl, no rwma, mild LVH, mild TR, PASP 70mHg; c 04/2015 SVT in ED->resolved with adenosine; c.   . Pneumonia 2010   hx of  . Scoliosis    multiple areas of back follows with Dr. KBoston Servicechiropractor    Past Surgical History:  Procedure Laterality Date  . ABDOMINAL HYSTERECTOMY     1990 ovaries intact. had 2/2 fibroids last pap 2004 neg.  ? if cervix present   . BACK SURGERY  1960's   d/t scoliosis-1964/65?  .Marland KitchenBACK SURGERY     x 3   . BREAST BIOPSY Left 2008   CORE W/CLIP - NEG  . BREAST BIOPSY     2000   . BREAST EXCISIONAL BIOPSY Right 20 + yrs ago  . BREAST SURGERY  1990   excision breast mass  . COLONOSCOPY  2007   Dr. EVira Agar . COLONOSCOPY WITH ESOPHAGOGASTRODUODENOSCOPY  (EGD) AND ESOPHAGEAL DILATION (ED)    . EYE SURGERY  2007   cataracts  . FOOT SURGERY  1960's  . JOINT REPLACEMENT     total hip left with metal hardward 2016 Dr. RJudd Lienortho   . OTHER SURGICAL HISTORY  2004   Bilateral cataracts  . PARTIAL HYSTERECTOMY  1990  . REVISION TOTAL HIP ARTHROPLASTY Left 04/17/2014   DR RMayer Camel . TOTAL HIP ARTHROPLASTY  2006  . TOTAL HIP REVISION Left 04/17/2014   Procedure: LEFT TOTAL HIP REVISION;  Surgeon: FKerin Salen MD;  [facility]  Service: [place]opedics;  Laterality: Left;   Family History  Problem Relation Age of Onset  . Heart attack Father   . Heart disease Father        MI  . Diabetes Father   . Hypertension Father   . Early death Father   . Cancer Mother        breast cancer  . Breast cancer Mother 758 . Arthritis Mother   . Dementia Mother   . Aneurysm Brother        cardiac   . Early death Brother    Social History   Socioeconomic History  . Marital status: Married    Spouse  name: Not on file  . Number of children: Not on file  . Years of education: Not on file  . Highest education level: Not on file  Occupational History  . Not on file  Social Needs  . Financial resource strain: Not on file  . Food insecurity:    Worry: Not on file    Inability: Not on file  . Transportation needs:    Medical: Not on file    Non-medical: Not on file  Tobacco Use  . Smoking status: Never Smoker  . Smokeless tobacco: Never Used  Substance and Sexual Activity  . Alcohol use: No  . Drug use: No  . Sexual activity: Yes    Birth control/protection: Surgical  Lifestyle  . Physical activity:    Days per week: Not on file    Minutes per session: Not on file  . Stress: Not on file  Relationships  . Social connections:    Talks on phone: Not on file    Gets together: Not on file    Attends religious service: Not on file    Active member of club or organization: Not on file    Attends meetings of clubs or organizations:  Not on file    Relationship status: Not on file  . Intimate partner violence:    Fear of current or ex partner: Not on file    Emotionally abused: Not on file    Physically abused: Not on file    Forced sexual activity: Not on file  Other Topics Concern  . 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 COD LIVER OIL PO Take by mouth daily.  . meloxicam (MOBIC) 15 MG tablet Take 15 mg by mouth daily.  . metoprolol succinate (TOPROL-XL) 25 MG 24 hr tablet Take 1 tablet (25 mg total) by mouth daily.  . metoprolol tartrate (LOPRESSOR) 25 MG tablet Take 1 tablet (25 mg) by mouth as needed for palpitations.  . Omega-3 Fatty Acids (FISH OIL) 500 MG CAPS Take by mouth daily. MEGA RED BRAND  . OVER THE COUNTER MEDICATION   . ranitidine (ZANTAC 75) 75 MG tablet Take 1 tablet (75 mg total) by mouth daily.  . Wheat Germ Oil OIL Take by mouth daily.  . Wild Yam, Dioscorea villosa, (WILD YAM PO) Take 3 capsules by mouth daily.   Allergies  Allergen Reactions  . Diltiazem     CD formulation likely generalized drug rash/eruption face, trunk, arms   . Tape Itching, Rash and Other (See Comments)    Reaction: blisters (adhesive tape)   No results found for this or any previous visit (from the past 2160 hour(s)). Objective  Body mass index is 33.98 kg/m. Wt Readings from Last 3 Encounters:  09/18/17 185 lb 12.8 oz (84.3 kg)  06/24/17 183 lb 3.2 oz (83.1 kg)  06/10/17 183 lb 4 oz (83.1 kg)   Temp Readings from Last 3 Encounters:  09/18/17 98 F (36.7 C) (Oral)  06/24/17 98.4 F (36.9 C) (Oral)  06/10/17 98.5 F (36.9 C) (Oral)   BP Readings from Last 3 Encounters:  09/18/17 130/78  06/24/17 136/80  06/10/17 (!) 170/94   Pulse Readings from Last 3 Encounters:  09/18/17 70  06/24/17 70  06/10/17 92    Physical Exam  Constitutional: She is oriented  to person, place, and time. Vital signs are normal. She appears well-developed and well-nourished. She is cooperative.  HENT:  Head: Normocephalic and atraumatic.  Mouth/Throat: Oropharynx is clear and moist and mucous membranes are normal.  Eyes: Pupils are equal, round, and reactive to light. Conjunctivae are normal.  Cardiovascular: Normal rate, regular rhythm and normal heart sounds.  Pulmonary/Chest: Effort normal and breath sounds normal.  Genitourinary: Vagina normal. Pelvic exam was performed with patient supine. There is no lesion on the right labia. There is no lesion on the left labia. Right adnexum displays no mass, no tenderness and no fullness. Left adnexum displays no mass, no tenderness and no fullness.  Genitourinary Comments: No cervix present min white discharge   Neurological: She is alert and oriented to person, place, and time. Gait normal.  Skin: Skin is warm, dry and intact.  Psychiatric: She has a normal mood and affect. Her speech is normal and behavior is normal. Judgment and thought content normal. Cognition and memory are normal.  Nursing note and vitals reviewed.   Assessment   1. HLD  2. SVT  3. Pending TKR 4. HM Plan  1. Given cholesterol info  Repeat lipid in 3 month s 2.  Prn tartrate 25 F/u cards  Can try trial of XL formulation at night since made sleepy during day  3.  Low risk cardiac revised cardiac index score lwo  4.  Declines flu shot  Given prevnar rx  Had not filled yet ? Year Tdap check [facility]  Disc shingrix prior appt  Consider hep B vaccine not immune  Hep C neg   Colonoscopy 2016 Dr. Juanita Craver need to get records.  mammo 07/30/17 neg  Pelvic today no cervix s/p hysterectomy fibroids no h/o abnormal pap no specimens taken today  DEXA 06/07/15 normal  Cbc and MMR check today  A1C 6.0 04/21/17   Never smoker   Provider: Dr. Olivia Mackie McLean-Scocuzza-Internal Medicine

## 2017-09-18 NOTE — Addendum Note (Signed)
Addended by: Quentin Ore on: 09/18/2017 08:38 AM   Modules accepted: Orders

## 2017-09-18 NOTE — Patient Instructions (Signed)
F/u in 4 months  Cholesterol Cholesterol is a white, waxy, fat-like substance that is needed by the human body in small amounts. The liver makes all the cholesterol we need. Cholesterol is carried from the liver by the blood through the blood vessels. Deposits of cholesterol (plaques) may build up on blood vessel (artery) walls. Plaques make the arteries narrower and stiffer. Cholesterol plaques increase the risk for heart attack and stroke. You cannot feel your cholesterol level even if it is very high. The only way to know that it is high is to have a blood test. Once you know your cholesterol levels, you should keep a record of the test results. Work with your health care provider to keep your levels in the desired range. What do the results mean?  Total cholesterol is a rough measure of all the cholesterol in your blood.  LDL (low-density lipoprotein) is the "bad" cholesterol. This is the type that causes plaque to build up on the artery walls. You want this level to be low.  HDL (high-density lipoprotein) is the "good" cholesterol because it cleans the arteries and carries the LDL away. You want this level to be high.  Triglycerides are fat that the body can either burn for energy or store. High levels are closely linked to heart disease. What are the desired levels of cholesterol?  Total cholesterol below 200.  LDL below 100 for people who are at risk, below 70 for people at very high risk.  HDL above 40 is good. A level of 60 or higher is considered to be protective against heart disease.  Triglycerides below 150. How can I lower my cholesterol? Diet Follow your diet program as told by your health care provider.  Choose fish or white meat chicken and [place], roasted or baked. Limit fatty cuts of red meat, fried foods, and processed meats, such as sausage and lunch meats.  Eat lots of fresh fruits and vegetables.  Choose whole grains, beans, pasta, potatoes, and cereals.  Choose  olive oil, corn oil, or canola oil, and use only small amounts.  Avoid butter, mayonnaise, shortening, or palm kernel oils.  Avoid foods with trans fats.  Drink skim or nonfat milk and eat low-fat or nonfat yogurt and cheeses. Avoid whole milk, cream, ice cream, egg yolks, and full-fat cheeses.  Healthier desserts include angel food cake, ginger snaps, animal crackers, hard candy, popsicles, and low-fat or nonfat frozen yogurt. Avoid pastries, cakes, pies, and cookies.  Exercise  Follow your exercise program as told by your health care provider. A regular program: ? Helps to decrease LDL and raise HDL. ? Helps with weight control.  Do things that increase your activity level, such as gardening, walking, and taking the stairs.  Ask your health care provider about ways that you can be more active in your daily life.  Medicine  Take over-the-counter and prescription medicines only as told by your health care provider. ? Medicine may be prescribed by your health care provider to help lower cholesterol and decrease the risk for heart disease. This is usually done if diet and exercise have failed to bring down cholesterol levels. ? If you have several risk factors, you may need medicine even if your levels are normal.  This information is not intended to replace advice given to you by your health care provider. Make sure you discuss any questions you have with your health care provider. Document Released: 01/28/2001 Document Revised: 12/01/2015 Document Reviewed: 11/03/2015 Elsevier Interactive Patient Education  2018 [employer]  

## 2017-09-18 NOTE — Progress Notes (Signed)
Pre visit review using our clinic review tool, if applicable. No additional management support is needed unless otherwise documented below in the visit note. 

## 2017-09-21 LAB — MEASLES/MUMPS/RUBELLA IMMUNITY: Mumps IgG: >300 AU/mL

## 2017-09-24 ENCOUNTER — Ambulatory Visit (INDEPENDENT_AMBULATORY_CARE_PROVIDER_SITE_OTHER): Payer: Medicare Other

## 2017-09-24 VITALS — BP 130/70 | HR 65 | Temp 98.1°F | Resp 12 | Ht 62.0 in | Wt 185.0 lb

## 2017-09-24 DIAGNOSIS — Z0000 Encounter for general adult medical examination without abnormal findings: Secondary | ICD-10-CM | POA: Diagnosis not present

## 2017-09-24 NOTE — Progress Notes (Signed)
Subjective:   Abryanna PERLA ECHAVARRIA is a 72 y.o. female who presents for an Initial Medicare Annual Wellness Visit.  Review of Systems    No ROS.  Medicare Wellness Visit. Additional risk factors are reflected in the social history.  Cardiac Risk Factors include: advanced age (>63men, >10 women);obesity (BMI >30kg/m2)     Objective:    Today's Vitals   09/24/17 1557  BP: 130/70  Pulse: 65  Resp: 12  Temp: 98.1 F (36.7 C)  TempSrc: Oral  SpO2: 99%  Weight: 185 lb (83.9 kg)  Height:  (1.575 m)   Body mass index is 33.84 kg/m.  Advanced Directives 09/24/2017 06/06/2017 04/21/2016 04/18/2014 04/07/2014  Does Patient Have a Medical Advance Directive? Yes Yes No No No  Type of Estate agent of [place];Living will Healthcare Power of [place];Living will - - -  Does patient want to make changes to medical advance directive? No - Patient declined No - Patient declined - - -  Copy of Healthcare Power of Attorney in Chart? No - copy requested No - copy requested - - -  Would patient like information on creating a medical advance directive? - - - No - patient declined information Yes - Educational materials given    Current Medications (verified) Outpatient Encounter Medications as of 09/24/2017  Medication Sig  . COD LIVER OIL PO Take by mouth daily.  . meloxicam (MOBIC) 15 MG tablet Take 15 mg by mouth daily.  . metoprolol succinate (TOPROL-XL) 25 MG 24 hr tablet Take 1 tablet (25 mg total) by mouth daily.  . metoprolol tartrate (LOPRESSOR) 25 MG tablet Take 1 tablet (25 mg) by mouth as needed for palpitations.  . Omega-3 Fatty Acids (FISH OIL) 500 MG CAPS Take by mouth daily. MEGA RED BRAND  . OVER THE COUNTER MEDICATION   . ranitidine (ZANTAC 75) 75 MG tablet Take 1 tablet (75 mg total) by mouth daily.  . Wheat Germ Oil OIL Take by mouth daily.  . Wild Yam, Dioscorea villosa, (WILD YAM PO) Take 3 capsules by mouth daily.   No facility-administered encounter  medications on file as of 09/24/2017.     Allergies (verified) Diltiazem and Tape   History: Past Medical History:  Diagnosis Date  . Arthritis   . Atypical chest pain    a. 04/2015 Myoview: EF 78%, breast attenuation, no ischemia-->Low risk.  . Back pain    scoliosis  . Chicken pox   . GERD (gastroesophageal reflux disease)   . Hyperlipidemia   . Joint pain   . Obesity, unspecified   . Paroxysmal SVT (supraventricular tachycardia) (HCC)    a. 2013 Holter: PACs/PVCs; b. 10/2011 Ehco: EF nl, no rwma, mild LVH, mild TR, PASP ; c 04/2015 SVT in ED->resolved with adenosine; c.   . Pneumonia 2010   hx of  . Scoliosis    multiple areas of back follows with Dr. Dyke Maes chiropractor    Past Surgical History:  Procedure Laterality Date  . ABDOMINAL HYSTERECTOMY     1990 ovaries intact. had 2/2 fibroids last pap 2004 neg.  ? if cervix present   . BACK SURGERY  1960's   d/t scoliosis-1964/65?  Marland Kitchen BACK SURGERY     x 3   . BREAST BIOPSY Left 2008   CORE W/CLIP - NEG  . BREAST BIOPSY     2000   . BREAST EXCISIONAL BIOPSY Right 20 + yrs ago  . BREAST SURGERY  1990   excision breast  mass  . COLONOSCOPY  2007   Dr. Mechele Collin  . COLONOSCOPY WITH ESOPHAGOGASTRODUODENOSCOPY (EGD) AND ESOPHAGEAL DILATION (ED)    . EYE SURGERY  2007   cataracts  . FOOT SURGERY  1960's  . JOINT REPLACEMENT     total hip left with metal hardward 2016 Dr. Alphonsa Gin ortho   . OTHER SURGICAL HISTORY  2004   Bilateral cataracts  . PARTIAL HYSTERECTOMY  1990  . REVISION TOTAL HIP ARTHROPLASTY Left 04/17/2014   DR Turner Daniels  . TOTAL HIP ARTHROPLASTY  2006  . TOTAL HIP REVISION Left 04/17/2014   Procedure: LEFT TOTAL HIP REVISION;  Surgeon: Nestor Lewandowsky, MD;  Location: [facility];  Service: Orthopedics;  Laterality: Left;   Family History  Problem Relation Age of Onset  . Heart attack Father   . Heart disease Father        MI  . Diabetes Father   . Hypertension Father   . Early death Father   .  Cancer Mother        breast cancer  . Breast cancer Mother 66  . Arthritis Mother   . Dementia Mother   . Aneurysm Brother        cardiac   . Early death Brother    Social History   Socioeconomic History  . Marital status: Married    Spouse name: Not on file  . Number of children: Not on file  . Years of education: Not on file  . Highest education level: Not on file  Occupational History  . Not on file  Social Needs  . Financial resource strain: Not hard at all  . Food insecurity:    Worry: Never true    Inability: Never true  . Transportation needs:    Medical: Not on file    Non-medical: Not on file  Tobacco Use  . Smoking status: Never Smoker  . Smokeless tobacco: Never Used  Substance and Sexual Activity  . Alcohol use: No  . Drug use: No  . Sexual activity: Yes    Birth control/protection: Surgical  Lifestyle  . Physical activity:    Days per week: Not on file    Minutes per session: Not on file  . Stress: Not on file  Relationships  . Social connections:    Talks on phone: Not on file    Gets together: Not on file    Attends religious service: Not on file    Active member of club or organization: Not on file    Attends meetings of clubs or organizations: Not on file    Relationship status: Not on file  Other Topics Concern  . 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 N  Vision? N  Difficulty concentrating or making decisions? N  Walking or climbing  stairs? Y  Comment Unsteady gait  Dressing or bathing? N  Doing errands, shopping? N  Preparing Food and eating ? N  Using the Toilet? N  In the past six months, have you accidently leaked urine? N  Do you have problems with loss of bowel control? N  Managing your Medications? N  Managing your Finances? N  Housekeeping or managing your Housekeeping? N  Some recent data might be hidden     Immunizations and Health Maintenance  There is no immunization history on file for this patient. Health Maintenance Due  Topic Date Due  . TETANUS/TDAP  04/02/1967  . COLONOSCOPY  04/01/1998  . PNA vac Low Risk Adult (1 of 2 - PCV13) 04/01/2013    Patient Care Team: McLean-Scocuzza, Pasty Spillers, MD as PCP - General (Internal Medicine) Iran Ouch, MD as PCP - Cardiology (Cardiology) Kieth Brightly, MD (General Surgery)  Indicate any recent Medical Services you may have received from other than [facility] providers in the past year (date may be approximate).     Assessment:   This is a routine wellness examination for Rosaly.  The goal of the wellness visit is to assist the patient how to close the gaps in care and create a preventative care plan for the patient.   The roster of all physicians providing medical care to patient is listed in the Snapshot section of the chart.  Osteoporosis risk reviewed.    Safety issues reviewed; Smoke and carbon monoxide detectors in the home. No firearms in the home. Wears seatbelts when driving or riding with others. No violence in the home.  They do not have excessive sun exposure.  Discussed the need for sun protection: hats, long sleeves and the use of sunscreen if there is significant sun exposure.  Patient is alert, normal appearance, oriented to person/place/and time.  Correctly identified the president of the [place]  and recalls of 3/3 words. Performs simple calculations and can read correct time from watch face. Displays appropriate judgement.  No new identified risk were noted.  No failures at ADL's or IADL's.   BMI- discussed the importance of a healthy diet, water intake and the benefits of aerobic exercise. Educational material provided.   24 hour diet recall: Low cholesterol diet.    Dental- every 12 months.  Eye- Visual acuity not assessed per patient preference since they have regular follow up with the ophthalmologist.  Wears corrective lenses.  Sleep patterns- Sleeps without issues.    Colonoscopy discussed.    Prevnar 13 discussed.   TDAP vaccine deferred per patient preference.  Follow up with insurance.  Educational material provided.  Patient Concerns: None at this time. Follow up with PCP as needed.  Hearing/Vision screen Hearing Screening Comments: Patient is able to hear conversational tones without difficulty.  No issues reported.   Vision Screening Comments: Followed by [facility] (Dr. Eliezer Lofts) Wears corrective lenses when reading Cataract extraction, bilateral Visual acuity not assessed per patient preference   Dietary issues and exercise activities discussed: Current Exercise Habits: Home exercise routine, Type of exercise: walking, Time (Minutes): 20, Frequency (Times/Week): 3, Weekly Exercise (Minutes/Week): 60, Intensity: Mild  Goals    . DIET - INCREASE LEAN PROTEINS     Low carb foods  Low cholesterol diet     . DIET - INCREASE WATER INTAKE     Stay hydrated    . Increase physical activity     Use the stationary bike daily for 20 minutes  Depression Screen PHQ 2/9 Scores 09/18/2017 06/24/2017  PHQ - 2 Score 0 0    Fall Risk Fall Risk  09/18/2017 06/24/2017  Falls in the past year? No No   Cognitive Function: MMSE - Mini Mental State Exam 09/24/2017  Orientation to time 5  Orientation to Place 5  Registration 3  Attention/  Calculation 5  Recall 3  Language- name 2 objects 2  Language- repeat 1  Language- follow 3 step command 3  Language- read & follow direction 1  Write a sentence 1  Copy design 1  Total score 30      Screening Tests Health Maintenance  Topic Date Due  . TETANUS/TDAP  04/02/1967  . COLONOSCOPY  04/01/1998  . PNA vac Low Risk Adult (1 of 2 - PCV13) 04/01/2013  . INFLUENZA VACCINE  12/22/2017 (Originally 12/17/2017)  . MAMMOGRAM  07/31/2019  . DEXA SCAN  Completed  . Hepatitis C Screening  Completed     Plan:    End of life planning; Advance aging; Advanced directives discussed. Copy of current HCPOA/Living Will requested.    I have personally reviewed and noted the following in the patient's chart:   . Medical and social history . Use of alcohol, tobacco or illicit drugs  . Current medications and supplements . Functional ability and status . Nutritional status . Physical activity . Advanced directives . List of other physicians . Hospitalizations, surgeries, and ER visits in previous 12 months . Vitals . Screenings to include cognitive, depression, and falls . Referrals and appointments  In addition, I have reviewed and discussed with patient certain preventive protocols, quality metrics, and best practice recommendations. A written personalized care plan for preventive services as well as general preventive health recommendations were provided to patient.     Ashok Pall, LPN   05/24/1094

## 2017-09-24 NOTE — Patient Instructions (Addendum)
  Kristy Mejia , Thank you for taking time to come for your Medicare Wellness Visit. I appreciate your ongoing commitment to your health goals. Please review the following plan we discussed and let me know if I can assist you in the future.   These are the goals we discussed: Goals    . DIET - INCREASE LEAN PROTEINS     Low carb foods  Low cholesterol diet     . DIET - INCREASE WATER INTAKE     Stay hydrated    . Increase physical activity     Use the stationary bike daily for 20 minutes       This is a list of the screening recommended for you and due dates:  Health Maintenance  Topic Date Due  . Tetanus Vaccine  04/02/1967  . Colon Cancer Screening  04/01/1998  . Pneumonia vaccines (1 of 2 - PCV13) 04/01/2013  . Flu Shot  12/22/2017*  . Mammogram  07/31/2019  . DEXA scan (bone density measurement)  Completed  .  Hepatitis C: One time screening is recommended by Center for Disease Control  (CDC) for  adults born from [birth year] through 1963.   Completed  *Topic was postponed. The date shown is not the original due date.

## 2017-09-30 ENCOUNTER — Other Ambulatory Visit: Payer: Self-pay | Admitting: Orthopedic Surgery

## 2017-10-19 NOTE — Pre-Procedure Instructions (Signed)
Kristy Mejia  10/19/2017      Your procedure is scheduled on October 30, 2017.  [facility] 09:45 [facility].M.  Call this number if you have problems the morning of surgery:  [phone]   Remember:  No food or liquids after midnight.  Continue all other medications as directed by your physician except for following these medication instructions before surgery.    Take these medicines the morning of surgery with A SIP OF WATER : Metoprolol (Lopressor) if needed Ranitidine (Zantac) if needed  7 days prior to surgery STOP taking any Aspirin (unless otherwise instructed by your surgeon), Meloxicam, Mobic, Aleve, Naproxen, Ibuprofen, Motrin, Advil, Goody's, BC's, all herbal medications, fish oil, and all vitamins.    Do not wear jewelry, make-up or nail polish.  Do not wear lotions, powders, or perfumes, or deodorant.  Do not shave 48 hours prior to surgery.   Do not bring valuables to the hospital.  [facility] is not responsible for any belongings or valuables.  Contacts, dentures or bridgework may not be worn into surgery.  Leave your suitcase in the car.  After surgery it may be brought to your room.  For patients admitted to the hospital, discharge time will be determined by your treatment team.  Patients discharged the day of surgery will not be allowed to drive home.   Special instructions:   [place]- Preparing For Surgery  Before surgery, you can play an important role. Because skin is not sterile, your skin needs to be as free of germs as possible. You can reduce the number of germs on your skin by washing with CHG (chlorahexidine gluconate) Soap before surgery.  CHG is an antiseptic cleaner which kills germs and bonds with the skin to continue killing germs even after washing.    Oral Hygiene is also important to reduce your risk of infection.  Remember - BRUSH YOUR TEETH THE MORNING OF SURGERY WITH YOUR REGULAR TOOTHPASTE  Please do not use  if you have an allergy to CHG or antibacterial soaps. If your skin becomes reddened/irritated stop using the CHG.  Do not shave (including legs and underarms) for at least 48 hours prior to first CHG shower. It is OK to shave your face.  Please follow these instructions carefully.   1. Shower the NIGHT BEFORE SURGERY and the MORNING OF SURGERY with CHG.   2. If you chose to wash your hair, wash your hair first as usual with your normal shampoo.  3. After you shampoo, rinse your hair and body thoroughly to remove the shampoo.  4. Use CHG as you would any other liquid soap. You can apply CHG directly to the skin and wash gently with a scrungie or a clean washcloth.   5. Apply the CHG Soap to your body ONLY FROM THE NECK DOWN.  Do not use on open wounds or open sores. Avoid contact with your eyes, ears, mouth and genitals (private parts). Wash Face and genitals (private parts)  with your normal soap.  6. Wash thoroughly, paying special attention to the area where your surgery will be performed.  7. Thoroughly rinse your body with warm water from the neck down.  8. DO NOT shower/wash with your normal soap after using and rinsing off the CHG Soap.  9. Pat yourself dry with a CLEAN TOWEL.  10. Wear CLEAN PAJAMAS to bed the night before surgery, wear comfortable clothes the morning of surgery  11. Place  CLEAN SHEETS on your bed the night of your first shower and DO NOT SLEEP WITH PETS.    Day of Surgery:  Do not apply any deodorants/lotions.  Please wear clean clothes to the hospital/surgery center.   Remember to brush your teeth WITH YOUR REGULAR TOOTHPASTE.    Please read over the following fact sheets that you were given.

## 2017-10-20 ENCOUNTER — Encounter (HOSPITAL_COMMUNITY)
Admission: RE | Admit: 2017-10-20 | Discharge: 2017-10-20 | Disposition: A | Discharge status: Home / Self Care | Payer: Medicare Other | Source: Ambulatory Visit | Attending: Orthopedic Surgery | Admitting: Orthopedic Surgery

## 2017-10-20 ENCOUNTER — Encounter (HOSPITAL_COMMUNITY): Payer: Self-pay

## 2017-10-20 ENCOUNTER — Ambulatory Visit (HOSPITAL_COMMUNITY)
Admission: RE | Admit: 2017-10-20 | Discharge: 2017-10-20 | Disposition: A | Discharge status: Home / Self Care | Payer: Medicare Other | Source: Ambulatory Visit | Attending: Orthopedic Surgery | Admitting: Orthopedic Surgery

## 2017-10-20 ENCOUNTER — Other Ambulatory Visit: Payer: Self-pay

## 2017-10-20 DIAGNOSIS — Z01818 Encounter for other preprocedural examination: Secondary | ICD-10-CM

## 2017-10-20 DIAGNOSIS — K449 Diaphragmatic hernia without obstruction or gangrene: Secondary | ICD-10-CM | POA: Insufficient documentation

## 2017-10-20 HISTORY — DX: Cardiac arrhythmia, unspecified: I49.9

## 2017-10-20 HISTORY — DX: Personal history of other diseases of the digestive system: Z87.19

## 2017-10-20 LAB — CBC WITH DIFFERENTIAL/PLATELET
Abs Immature Granulocytes: 0 10*3/uL (ref 0.0–0.1)
BASOS ABS: 0.1 10*3/uL (ref 0.0–0.1)
Basophils Relative: 2 %
EOS PCT: 4 %
Eosinophils Absolute: 0.2 10*3/uL (ref 0.0–0.7)
HEMATOCRIT: 45.7 % (ref 36.0–46.0)
HEMOGLOBIN: 14.5 g/dL (ref 12.0–15.0)
Immature Granulocytes: 0 %
LYMPHS ABS: 1.3 10*3/uL (ref 0.7–4.0)
LYMPHS PCT: 36 %
MCH: 29.4 pg (ref 26.0–34.0)
MCHC: 31.7 g/dL (ref 30.0–36.0)
MCV: 92.7 fL (ref 78.0–100.0)
MONO ABS: 0.4 10*3/uL (ref 0.1–1.0)
MONOS PCT: 10 %
NEUTROS ABS: 1.8 10*3/uL (ref 1.7–7.7)
Neutrophils Relative %: 48 %
Platelets: 237 10*3/uL (ref 150–400)
RBC: 4.93 MIL/uL (ref 3.87–5.11)
RDW: 13.2 % (ref 11.5–15.5)
WBC: 3.6 10*3/uL — ABNORMAL LOW (ref 4.0–10.5)

## 2017-10-20 LAB — URINALYSIS, ROUTINE W REFLEX MICROSCOPIC
BILIRUBIN URINE: NEGATIVE
GLUCOSE, UA: NEGATIVE mg/dL
Hgb urine dipstick: NEGATIVE
KETONES UR: NEGATIVE mg/dL
Leukocytes, UA: NEGATIVE
NITRITE: NEGATIVE
PH: 6 (ref 5.0–8.0)
Protein, ur: NEGATIVE mg/dL
Specific Gravity, Urine: 1.017 (ref 1.005–1.030)

## 2017-10-20 LAB — BASIC METABOLIC PANEL
ANION GAP: 8 (ref 5–15)
BUN: 16 mg/dL (ref 6–20)
CHLORIDE: 106 mmol/L (ref 101–111)
CO2: 27 mmol/L (ref 22–32)
Calcium: 9.4 mg/dL (ref 8.9–10.3)
Creatinine, Ser: 0.73 mg/dL (ref 0.44–1.00)
GFR calc Af Amer: >60 mL/min (ref >60)
GFR calc non Af Amer: >60 mL/min (ref >60)
GLUCOSE: 99 mg/dL (ref 65–99)
POTASSIUM: 3.8 mmol/L (ref 3.5–5.1)
Sodium: 141 mmol/L (ref 135–145)

## 2017-10-20 LAB — PROTIME-INR
INR: 0.95
PROTHROMBIN TIME: 12.6 s (ref 11.4–15.2)

## 2017-10-20 LAB — SURGICAL PCR SCREEN
MRSA, PCR: NEGATIVE
STAPHYLOCOCCUS AUREUS: NEGATIVE

## 2017-10-20 LAB — TYPE AND SCREEN
ABO/RH(D): O POS
Antibody Screen: NEGATIVE

## 2017-10-20 LAB — APTT: APTT: 32 s (ref 24–36)

## 2017-10-21 NOTE — Progress Notes (Signed)
Anesthesia Chart Review:   Case:  [id number] Date/Time:  10/30/17 1130   Procedure:  RIGHT TOTAL KNEE ARTHROPLASTY (Right Knee)   Anesthesia type:  Spinal   Pre-op diagnosis:  RIGHT KNEE OSTEOARTHRITIS   [facility] RO[facility] 06 / [facility]   Surgeon:  Gean Birchwoodowan, Frank, MD      DISCUSSION: - Pt is a 72 year old female with hx PSVT, scoliosis (s/p surgery 1964)   VS: BP (!) 141/70   Pulse 65   Temp 36.7 C   Resp 20   Ht 5\' 2"  (1.575 m)   Wt 181 lb 1.6 oz (82.1 kg)   SpO2 100%   BMI 33.12 kg/m    PROVIDERS: PCP is McLean-Scocuzza, Pasty Spillersracy N, MD who cleared pt for surgery at last office visit 09/18/17 Cardiologist is Lorine BearsMuhammad Arida, MD. Last office visit 05/21/17 with Ward Givenshris Berge, NP; 6 month f/u recommended   LABS: Labs reviewed: Acceptable for surgery. (all labs ordered are listed, but only abnormal results are displayed)  Labs Reviewed  CBC WITH DIFFERENTIAL/PLATELET - Abnormal; Notable for the following components:      Result Value   WBC 3.6 (*)    All other components within normal limits  SURGICAL PCR SCREEN  APTT  BASIC METABOLIC PANEL  PROTIME-INR  URINALYSIS, ROUTINE W REFLEX MICROSCOPIC  TYPE AND SCREEN     IMAGES:  CXR 10/20/17: Large hiatal hernia.  No acute abnormality noted.   EKG 10/20/17: NSR   CV:  Nuclear stress test 04/30/15:   Nuclear stress EF: 78%.  There was no ST segment deviation noted during stress.  Medium sized, moderate in intensity fixed defect involving the apical lateral, apical, mid and basal inferolateral walls. There are large pendulous breasts that cover the entire inferior wall on RAW images. In setting of normal wall motion, this is consistent with breast attenuation artifact. No ischemia noted.  This is a low risk study.  The left ventricular ejection fraction is hyperdynamic (>65%).  Echo 10/27/11: - Left ventricle: The cavity size was normal. Wall thickness was increased in a pattern of mild LVH. Systolic function was normal.  Wall motion was normal; there were no regionalwall motion abnormalities. Left ventricular diastolicfunction parameters were normal. - Right ventricle: Systolic function was normal. - Tricuspid valve: Mild regurgitation. - Pulmonary arteries: PA peak pressure: 34mm Hg (S).  Holter monitor 11/13/11:  - predominantly SR with periods of SB and ST, occasional multiple focal PVCs and rare PACs.   Past Medical History:  Diagnosis Date  . Arthritis   . Atypical chest pain    a. 04/2015 Myoview: EF 78%, breast attenuation, no ischemia-->Low risk.  . Back pain    scoliosis  . Chicken pox   . Dysrhythmia    hx palpitations  . GERD (gastroesophageal reflux disease)   . History of hiatal hernia    noted on cxr  . Hyperlipidemia   . Joint pain   . Obesity, unspecified   . Paroxysmal SVT (supraventricular tachycardia) (HCC)    a. 2013 Holter: PACs/PVCs; b. 10/2011 Ehco: EF nl, no rwma, mild LVH, mild TR, PASP 34mmHg; c 04/2015 SVT in ED->resolved with adenosine; c.   . Pneumonia 2010   hx of  . Scoliosis    multiple areas of back follows with Dr. Dyke MaesKen Brown chiropractor     Past Surgical History:  Procedure Laterality Date  . ABDOMINAL HYSTERECTOMY     1990 ovaries intact. had 2/2 fibroids last pap 2004 neg.  ?  if cervix present   . BACK SURGERY  1960's   d/t scoliosis-1964/65?  Marland Kitchen BACK SURGERY     x 3   . BREAST BIOPSY Left 2008   CORE W/CLIP - NEG  . BREAST BIOPSY     2000   . BREAST EXCISIONAL BIOPSY Right 20 + yrs ago  . BREAST SURGERY  1990   excision breast mass  . COLONOSCOPY  2007   Dr. Mechele Collin  . COLONOSCOPY WITH ESOPHAGOGASTRODUODENOSCOPY (EGD) AND ESOPHAGEAL DILATION (ED)    . EYE SURGERY  2007   cataracts  . FOOT SURGERY  1960's  . JOINT REPLACEMENT     total hip left with metal hardward 2016 Dr. Alphonsa Gin ortho   . OTHER SURGICAL HISTORY  2004   Bilateral cataracts  . PARTIAL HYSTERECTOMY  1990  . REVISION TOTAL HIP ARTHROPLASTY Left 04/17/2014   DR Turner Daniels   . TOTAL HIP ARTHROPLASTY  2006  . TOTAL HIP REVISION Left 04/17/2014   Procedure: LEFT TOTAL HIP REVISION;  Surgeon: Nestor Lewandowsky, MD;  Location: [facility] OR;  Service: Orthopedics;  Laterality: Left;    MEDICATIONS: . aspirin EC 81 MG tablet  . meloxicam (MOBIC) 15 MG tablet  . metoprolol succinate (TOPROL-XL) 25 MG 24 hr tablet  . metoprolol tartrate (LOPRESSOR) 25 MG tablet  . ranitidine (ZANTAC 75) 75 MG tablet   No current facility-administered medications for this encounter.     If no changes, I anticipate pt can proceed with surgery as scheduled.   Rica Mast, FNP-BC [facility] Short Stay Surgical Center/Anesthesiology Phone: [phone] 10/21/2017 2:56 PM

## 2017-10-27 DIAGNOSIS — M1711 Unilateral primary osteoarthritis, right knee: Secondary | ICD-10-CM | POA: Diagnosis present

## 2017-10-27 NOTE — H&P (Signed)
TOTAL KNEE ADMISSION H&P  Patient is being admitted for right total knee arthroplasty.  Subjective:  Chief Complaint:right knee pain.  HPI: Kristy Mejia, 72 y.o. female, has a history of pain and functional disability in the right knee due to arthritis and has failed non-surgical conservative treatments for greater than 12 weeks to includeNSAID's and/or analgesics, corticosteriod injections, flexibility and strengthening excercises, use of assistive devices, weight reduction as appropriate and activity modification.  Onset of symptoms was gradual, starting 2 years ago with gradually worsening course since that time. The patient noted no past surgery on the right knee(s).  Patient currently rates pain in the right knee(s) at 10 out of 10 with activity. Patient has night pain, worsening of pain with activity and weight bearing, pain that interferes with activities of daily living, pain with passive range of motion and crepitus.  Patient has evidence of joint space narrowing by imaging studies.  There is no active infection.  Patient Active Problem List   Diagnosis Date Noted  . Knee pain, bilateral 09/18/2017  . Elevated blood pressure reading 06/24/2017  . Vitamin D deficiency 04/21/2017  . Hyperlipidemia 04/21/2017  . Paroxysmal supraventricular tachycardia (HCC) 04/24/2015  . Prediabetes 04/24/2015  . Status post revision of total hip 04/17/2014  . 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 Family history of breast cancer 06/22/2013  . Diffuse cystic mastopathy 06/20/2013  . Palpitations 09/25/2011  . Dyspnea 09/25/2011   Past Medical History:  Diagnosis Date  . Arthritis   . Atypical chest pain    a. 04/2015 Myoview: EF 78%, breast attenuation, no ischemia-->Low risk.  . Back pain    scoliosis  . Chicken pox   . Dysrhythmia    hx palpitations  . GERD (gastroesophageal reflux disease)   . History of hiatal hernia    noted on cxr  . Hyperlipidemia   . Joint pain   .  Obesity, unspecified   . Paroxysmal SVT (supraventricular tachycardia) (HCC)    a. 2013 Holter: PACs/PVCs; b. 10/2011 Ehco: EF nl, no rwma, mild LVH, mild TR, PASP ; c 04/2015 SVT in ED->resolved with adenosine; c.   . Pneumonia 2010   hx of  . Scoliosis    multiple areas of back follows with Dr. Dyke Maes chiropractor     Past Surgical History:  Procedure Laterality Date  . ABDOMINAL HYSTERECTOMY     1990 ovaries intact. had 2/2 fibroids last pap 2004 neg.  ? if cervix present   . BACK SURGERY  1960's   d/t scoliosis-1964/65?  Marland Kitchen BACK SURGERY     x 3   . BREAST BIOPSY Left 2008   CORE W/CLIP - NEG  . BREAST BIOPSY     2000   . BREAST EXCISIONAL BIOPSY Right 20 + yrs ago  . BREAST SURGERY  1990   excision breast mass  . COLONOSCOPY  2007   Dr. Mechele Collin  . COLONOSCOPY WITH ESOPHAGOGASTRODUODENOSCOPY (EGD) AND ESOPHAGEAL DILATION (ED)    . EYE SURGERY  2007   cataracts  . FOOT SURGERY  1960's  . JOINT REPLACEMENT     total hip left with metal hardward 2016 Dr. Alphonsa Gin ortho   . OTHER SURGICAL HISTORY  2004   Bilateral cataracts  . PARTIAL HYSTERECTOMY  1990  . REVISION TOTAL HIP ARTHROPLASTY Left 04/17/2014   DR Turner Daniels  . TOTAL HIP ARTHROPLASTY  2006  . TOTAL HIP REVISION Left 04/17/2014   Procedure: LEFT TOTAL HIP REVISION;  Surgeon: Homero Fellers  Tawni Carnes, MD;  Location: [facility] OR;  Service: Orthopedics;  Laterality: Left;    No current facility-administered medications for this encounter.    Current Outpatient Medications  Medication Sig Dispense Refill Last Dose  . aspirin EC 81 MG tablet Take 81 mg by mouth daily.     . meloxicam (MOBIC) 15 MG tablet Take 15 mg by mouth daily as needed for pain.    Taking  . metoprolol tartrate (LOPRESSOR) 25 MG tablet Take 1 tablet (25 mg) by mouth as needed for palpitations. (Patient taking differently: Take 25 mg by mouth See admin instructions. Take 25 mg as needed every 15 minutes until tachycardia is resolved (max 75 mg per  episode)) 180 tablet 0 Taking  . ranitidine (ZANTAC 75) 75 MG tablet Take 1 tablet (75 mg total) by mouth daily. (Patient taking differently: Take 75 mg by mouth daily as needed for heartburn. ) 30 tablet 6 Taking  . metoprolol succinate (TOPROL-XL) 25 MG 24 hr tablet Take 1 tablet (25 mg total) by mouth daily. (Patient not taking: Reported on 10/14/2017) 90 tablet 1 Not Taking at Unknown time   Allergies  Allergen Reactions  . Diltiazem     CD formulation likely generalized drug rash/eruption face, trunk, arms   . Metoprolol Succinate [Metoprolol]     Succinate version causes extreme lethargy   . Tape Itching, Rash and Other (See Comments)    Reaction: blisters (adhesive tape) Paper tape is ok    Social History   Tobacco Use  . Smoking status: Never Smoker  . Smokeless tobacco: Never Used  Substance Use Topics  . Alcohol use: No    Family History  Problem Relation Age of Onset  . Heart attack Father   . Heart disease Father        MI  . Diabetes Father   . Hypertension Father   . Early death Father   . Cancer Mother        breast cancer  . Breast cancer Mother 4  . Arthritis Mother   . Dementia Mother   . Aneurysm Brother        cardiac   . Early death Brother      Review of Systems  Constitutional: Positive for diaphoresis.  HENT: Negative.   Eyes: Negative.   Respiratory: Negative.   Cardiovascular:       Tachycardia  Gastrointestinal: Negative.   Genitourinary: Negative.   Musculoskeletal: Positive for joint pain and myalgias.  Skin: Negative.   Neurological: Negative.   Endo/Heme/Allergies: Negative.   Psychiatric/Behavioral: Negative.     Objective:  Physical Exam  Constitutional: She is oriented to person, place, and time. She appears well-developed and well-nourished.  HENT:  Head: Normocephalic and atraumatic.  Eyes: Pupils are equal, round, and reactive to light.  Neck: Normal range of motion. Neck supple.  Cardiovascular: Intact distal  pulses.  Respiratory: Effort normal.  Musculoskeletal: She exhibits tenderness.  ender to palpation along both the medial and lateral joint lines of the right knee, no signs of ligamentous instability is Lachman, anterior drawer, valgus, varus stress testing negative, McMurray positive for both pain and crepitus, range of motion today is from 0 to 130, do feel crepitation along the arc of range of motion, strength 5/5 with knee flexion and knee extension, sensation 2+ bilateral lower extremities,moist oral mucosa, normal respirations, normal peripheral pulses, no rashes evident on visible skin, mood is described as good  Neurological: She is alert and oriented to person, place, and  time.  Skin: Skin is warm and dry.  Psychiatric: She has a normal mood and affect. Her behavior is normal. Judgment and thought content normal.    Vital signs in last 24 hours:    Labs:   Estimated body mass index is 33.12 kg/m as calculated from the following:   Height as of 10/20/17: 5\' 2"  (1.575 m).   Weight as of 10/20/17: 82.1 kg (181 lb 1.6 oz).   Imaging Review Plain radiographs demonstrate  4 views of the right knee were obtained in the office including AP, lateral, sunrise, Zoila ShutterRosenberg, she does have end-stage degenerative joint disease in the right knee, particularly involving the lateral compartment where she has bone-on-bone changes, as well as increased genu valgum secondary to loss of cartilage in the lateral compartment   Preoperative templating of the joint replacement has been completed, documented, and submitted to the Operating Room personnel in order to optimize intra-operative equipment management.   Anticipated LOS equal to or greater than 2 midnights due to - Age 70 and older with one or more of the following:  - Obesity  - Expected need for hospital services (PT, OT, Nursing) required for safe  discharge  - Anticipated need for postoperative skilled nursing care or inpatient rehab  -  Active co-morbidities: Cardiac Arrhythmia      Assessment/Plan:  End stage arthritis, right knee   The patient history, physical examination, clinical judgment of the provider and imaging studies are consistent with end stage degenerative joint disease of the right knee(s) and total knee arthroplasty is deemed medically necessary. The treatment options including medical management, injection therapy arthroscopy and arthroplasty were discussed at length. The risks and benefits of total knee arthroplasty were presented and reviewed. The risks due to aseptic loosening, infection, stiffness, patella tracking problems, thromboembolic complications and other imponderables were discussed. The patient acknowledged the explanation, agreed to proceed with the plan and consent was signed. Patient is being admitted for inpatient treatment for surgery, pain control, PT, OT, prophylactic antibiotics, VTE prophylaxis, progressive ambulation and ADL's and discharge planning. The patient is planning to be discharged home with home health services.

## 2017-10-29 MED ORDER — TRANEXAMIC ACID 1000 MG/10ML IV SOLN
1000.0000 mg | INTRAVENOUS | Status: AC
  Administered 2017-10-30: 1000 mg via INTRAVENOUS
  Filled 2017-10-29: qty 1100

## 2017-10-29 MED ORDER — TRANEXAMIC ACID 1000 MG/10ML IV SOLN
2000.0000 mg | INTRAVENOUS | Status: AC
  Administered 2017-10-30: 2000 mg via TOPICAL
  Filled 2017-10-29: qty 20

## 2017-10-29 MED ORDER — BUPIVACAINE LIPOSOME 1.3 % IJ SUSP
20.0000 mL | Freq: Once | INTRAMUSCULAR | Status: AC
  Administered 2017-10-30: 20 mL
  Filled 2017-10-29: qty 20

## 2017-10-30 ENCOUNTER — Other Ambulatory Visit: Payer: Self-pay

## 2017-10-30 ENCOUNTER — Encounter (HOSPITAL_COMMUNITY): Payer: Self-pay | Admitting: Urology

## 2017-10-30 ENCOUNTER — Inpatient Hospital Stay (HOSPITAL_COMMUNITY)
Admission: AD | Admit: 2017-10-30 | Discharge: 2017-11-01 | DRG: 470 | Disposition: A | Discharge status: Home Health | Payer: Medicare Other | Source: Ambulatory Visit | Attending: Orthopedic Surgery | Admitting: Orthopedic Surgery

## 2017-10-30 ENCOUNTER — Ambulatory Visit (HOSPITAL_COMMUNITY): Payer: Medicare Other | Admitting: Certified Registered Nurse Anesthetist

## 2017-10-30 ENCOUNTER — Ambulatory Visit (HOSPITAL_COMMUNITY): Payer: Medicare Other | Admitting: Emergency Medicine

## 2017-10-30 ENCOUNTER — Encounter (HOSPITAL_COMMUNITY)
Admission: AD | Disposition: A | Discharge status: Home Health | Payer: Self-pay | Source: Ambulatory Visit | Attending: Orthopedic Surgery

## 2017-10-30 DIAGNOSIS — Z96642 Presence of left artificial hip joint: Secondary | ICD-10-CM | POA: Diagnosis not present

## 2017-10-30 DIAGNOSIS — K219 Gastro-esophageal reflux disease without esophagitis: Secondary | ICD-10-CM | POA: Diagnosis not present

## 2017-10-30 DIAGNOSIS — M1711 Unilateral primary osteoarthritis, right knee: Secondary | ICD-10-CM | POA: Diagnosis present

## 2017-10-30 DIAGNOSIS — K449 Diaphragmatic hernia without obstruction or gangrene: Secondary | ICD-10-CM | POA: Diagnosis not present

## 2017-10-30 DIAGNOSIS — Z91048 Other nonmedicinal substance allergy status: Secondary | ICD-10-CM

## 2017-10-30 DIAGNOSIS — Z90710 Acquired absence of both cervix and uterus: Secondary | ICD-10-CM

## 2017-10-30 DIAGNOSIS — Z803 Family history of malignant neoplasm of breast: Secondary | ICD-10-CM | POA: Diagnosis not present

## 2017-10-30 DIAGNOSIS — E669 Obesity, unspecified: Secondary | ICD-10-CM | POA: Diagnosis present

## 2017-10-30 DIAGNOSIS — M25761 Osteophyte, right knee: Secondary | ICD-10-CM | POA: Diagnosis not present

## 2017-10-30 DIAGNOSIS — Z888 Allergy status to other drugs, medicaments and biological substances status: Secondary | ICD-10-CM | POA: Diagnosis not present

## 2017-10-30 DIAGNOSIS — E785 Hyperlipidemia, unspecified: Secondary | ICD-10-CM | POA: Diagnosis not present

## 2017-10-30 DIAGNOSIS — Z8249 Family history of ischemic heart disease and other diseases of the circulatory system: Secondary | ICD-10-CM

## 2017-10-30 DIAGNOSIS — Z6833 Body mass index (BMI) 33.0-33.9, adult: Secondary | ICD-10-CM | POA: Diagnosis not present

## 2017-10-30 DIAGNOSIS — Z833 Family history of diabetes mellitus: Secondary | ICD-10-CM

## 2017-10-30 DIAGNOSIS — Z79899 Other long term (current) drug therapy: Secondary | ICD-10-CM | POA: Diagnosis not present

## 2017-10-30 DIAGNOSIS — R7303 Prediabetes: Secondary | ICD-10-CM | POA: Diagnosis not present

## 2017-10-30 DIAGNOSIS — Z7982 Long term (current) use of aspirin: Secondary | ICD-10-CM | POA: Diagnosis not present

## 2017-10-30 DIAGNOSIS — M419 Scoliosis, unspecified: Secondary | ICD-10-CM | POA: Diagnosis present

## 2017-10-30 HISTORY — PX: TOTAL KNEE ARTHROPLASTY: SHX125

## 2017-10-30 SURGERY — ARTHROPLASTY, KNEE, TOTAL
Anesthesia: General | Site: Knee | Laterality: Right

## 2017-10-30 MED ORDER — MIDAZOLAM HCL 2 MG/2ML IJ SOLN
1.0000 mg | Freq: Once | INTRAMUSCULAR | Status: AC
  Administered 2017-10-30: 1 mg via INTRAVENOUS

## 2017-10-30 MED ORDER — METHOCARBAMOL 500 MG PO TABS
ORAL_TABLET | ORAL | Status: AC
  Administered 2017-10-30: 500 mg via ORAL
  Filled 2017-10-30: qty 1

## 2017-10-30 MED ORDER — FENTANYL CITRATE (PF) 100 MCG/2ML IJ SOLN
INTRAMUSCULAR | Status: AC
  Administered 2017-10-30: 50 ug via INTRAVENOUS
  Filled 2017-10-30: qty 2

## 2017-10-30 MED ORDER — SODIUM CHLORIDE 0.9 % IR SOLN
Status: DC | PRN
  Administered 2017-10-30: 3000 mL

## 2017-10-30 MED ORDER — FLEET ENEMA 7-19 GM/118ML RE ENEM: 1.0000 | ENEMA | Freq: Once | RECTAL | Status: DC | PRN

## 2017-10-30 MED ORDER — DOCUSATE SODIUM 100 MG PO CAPS
100.0000 mg | ORAL_CAPSULE | Freq: Two times a day (BID) | ORAL | Status: DC
  Administered 2017-10-30 – 2017-11-01 (×4): 100 mg via ORAL
  Filled 2017-10-30 (×4): qty 1

## 2017-10-30 MED ORDER — ONDANSETRON HCL 4 MG PO TABS
4.0000 mg | ORAL_TABLET | Freq: Four times a day (QID) | ORAL | Status: DC | PRN
  Administered 2017-10-31: 4 mg via ORAL

## 2017-10-30 MED ORDER — FENTANYL CITRATE (PF) 250 MCG/5ML IJ SOLN
INTRAMUSCULAR | Status: AC
Start: 2017-10-30 — End: ?
  Filled 2017-10-30: qty 5

## 2017-10-30 MED ORDER — ZOLPIDEM TARTRATE 5 MG PO TABS: 5.0000 mg | ORAL_TABLET | Freq: Every evening | ORAL | Status: DC | PRN

## 2017-10-30 MED ORDER — DEXAMETHASONE SODIUM PHOSPHATE 10 MG/ML IJ SOLN
INTRAMUSCULAR | Status: DC | PRN
  Administered 2017-10-30: 5 mg via INTRAVENOUS

## 2017-10-30 MED ORDER — PROPOFOL 10 MG/ML IV BOLUS
INTRAVENOUS | Status: AC
  Filled 2017-10-30: qty 20

## 2017-10-30 MED ORDER — TRANEXAMIC ACID 1000 MG/10ML IV SOLN
1000.0000 mg | Freq: Once | INTRAVENOUS | Status: AC
  Filled 2017-10-30: qty 10

## 2017-10-30 MED ORDER — PHENYLEPHRINE 40 MCG/ML (10ML) SYRINGE FOR IV PUSH (FOR BLOOD PRESSURE SUPPORT)
PREFILLED_SYRINGE | INTRAVENOUS | Status: AC
  Filled 2017-10-30: qty 10

## 2017-10-30 MED ORDER — OXYCODONE HCL 5 MG/5ML PO SOLN: 5.0000 mg | Freq: Once | ORAL | Status: AC | PRN

## 2017-10-30 MED ORDER — CELECOXIB 200 MG PO CAPS
200.0000 mg | ORAL_CAPSULE | Freq: Two times a day (BID) | ORAL | Status: DC
  Administered 2017-10-30 – 2017-11-01 (×4): 200 mg via ORAL
  Filled 2017-10-30 (×4): qty 1

## 2017-10-30 MED ORDER — OXYCODONE HCL 5 MG PO TABS
5.0000 mg | ORAL_TABLET | ORAL | Status: DC | PRN
  Filled 2017-10-30 (×2): qty 1

## 2017-10-30 MED ORDER — ONDANSETRON HCL 4 MG/2ML IJ SOLN
INTRAMUSCULAR | Status: AC
  Filled 2017-10-30: qty 2

## 2017-10-30 MED ORDER — KCL IN DEXTROSE-NACL 20-5-0.45 MEQ/L-%-% IV SOLN
INTRAVENOUS | Status: AC
  Administered 2017-10-30: 100 mL/h via INTRAVENOUS
  Filled 2017-10-30: qty 1000

## 2017-10-30 MED ORDER — CEFAZOLIN SODIUM-DEXTROSE 2-4 GM/100ML-% IV SOLN
2.0000 g | INTRAVENOUS | Status: AC
  Administered 2017-10-30: 2 g via INTRAVENOUS

## 2017-10-30 MED ORDER — ONDANSETRON HCL 4 MG/2ML IJ SOLN
4.0000 mg | Freq: Once | INTRAMUSCULAR | Status: AC | PRN
  Administered 2017-10-30: 4 mg via INTRAVENOUS

## 2017-10-30 MED ORDER — BUPIVACAINE-EPINEPHRINE (PF) 0.25% -1:200000 IJ SOLN
INTRAMUSCULAR | Status: DC | PRN
  Administered 2017-10-30: 30 mL

## 2017-10-30 MED ORDER — DIPHENHYDRAMINE HCL 12.5 MG/5ML PO ELIX: 12.5000 mg | ORAL_SOLUTION | ORAL | Status: DC | PRN

## 2017-10-30 MED ORDER — 0.9 % SODIUM CHLORIDE (POUR BTL) OPTIME
TOPICAL | Status: DC | PRN
  Administered 2017-10-30: 1000 mL

## 2017-10-30 MED ORDER — OXYCODONE-ACETAMINOPHEN 5-325 MG PO TABS: 1.0000 | ORAL_TABLET | ORAL | 0 refills | Status: DC | PRN

## 2017-10-30 MED ORDER — LACTATED RINGERS IV SOLN: INTRAVENOUS | Status: DC

## 2017-10-30 MED ORDER — FENTANYL CITRATE (PF) 100 MCG/2ML IJ SOLN
INTRAMUSCULAR | Status: DC | PRN
  Administered 2017-10-30 (×2): 50 ug via INTRAVENOUS

## 2017-10-30 MED ORDER — ASPIRIN 81 MG PO CHEW
81.0000 mg | CHEWABLE_TABLET | Freq: Two times a day (BID) | ORAL | Status: DC
  Administered 2017-10-30 – 2017-11-01 (×4): 81 mg via ORAL
  Filled 2017-10-30 (×4): qty 1

## 2017-10-30 MED ORDER — FENTANYL CITRATE (PF) 100 MCG/2ML IJ SOLN
25.0000 ug | INTRAMUSCULAR | Status: DC | PRN
  Administered 2017-10-30: 50 ug via INTRAVENOUS

## 2017-10-30 MED ORDER — PROPOFOL 10 MG/ML IV BOLUS
INTRAVENOUS | Status: DC | PRN
  Administered 2017-10-30: 160 mg via INTRAVENOUS

## 2017-10-30 MED ORDER — GABAPENTIN 300 MG PO CAPS
300.0000 mg | ORAL_CAPSULE | Freq: Three times a day (TID) | ORAL | Status: DC
  Administered 2017-10-30 – 2017-11-01 (×5): 300 mg via ORAL
  Filled 2017-10-30 (×5): qty 1

## 2017-10-30 MED ORDER — METOPROLOL TARTRATE 25 MG PO TABS: 25.0000 mg | ORAL_TABLET | Freq: Every day | ORAL | Status: DC | PRN

## 2017-10-30 MED ORDER — MIDAZOLAM HCL 2 MG/2ML IJ SOLN
INTRAMUSCULAR | Status: AC
  Administered 2017-10-30: 1 mg via INTRAVENOUS
  Filled 2017-10-30: qty 2

## 2017-10-30 MED ORDER — MENTHOL 3 MG MT LOZG: 1.0000 | LOZENGE | OROMUCOSAL | Status: DC | PRN

## 2017-10-30 MED ORDER — DEXAMETHASONE SODIUM PHOSPHATE 10 MG/ML IJ SOLN
INTRAMUSCULAR | Status: AC
  Filled 2017-10-30: qty 1

## 2017-10-30 MED ORDER — ACETAMINOPHEN 325 MG PO TABS
325.0000 mg | ORAL_TABLET | Freq: Four times a day (QID) | ORAL | Status: DC | PRN
  Administered 2017-10-30 – 2017-11-01 (×5): 650 mg via ORAL
  Filled 2017-10-30 (×5): qty 2

## 2017-10-30 MED ORDER — LACTATED RINGERS IV SOLN
INTRAVENOUS | Status: DC
  Administered 2017-10-30 (×2): via INTRAVENOUS

## 2017-10-30 MED ORDER — BISACODYL 5 MG PO TBEC: 5.0000 mg | DELAYED_RELEASE_TABLET | Freq: Every day | ORAL | Status: DC | PRN

## 2017-10-30 MED ORDER — METHOCARBAMOL 500 MG PO TABS
500.0000 mg | ORAL_TABLET | Freq: Four times a day (QID) | ORAL | Status: DC | PRN
  Administered 2017-10-30 – 2017-10-31 (×4): 500 mg via ORAL
  Filled 2017-10-30 (×4): qty 1

## 2017-10-30 MED ORDER — CHLORHEXIDINE GLUCONATE 4 % EX LIQD: 60.0000 mL | Freq: Once | CUTANEOUS | Status: DC

## 2017-10-30 MED ORDER — TIZANIDINE HCL 2 MG PO TABS: 2.0000 mg | ORAL_TABLET | Freq: Four times a day (QID) | ORAL | 0 refills | Status: DC | PRN

## 2017-10-30 MED ORDER — ORAL CARE MOUTH RINSE
15.0000 mL | Freq: Two times a day (BID) | OROMUCOSAL | Status: DC
  Administered 2017-10-31 (×3): 15 mL via OROMUCOSAL

## 2017-10-30 MED ORDER — ASPIRIN EC 81 MG PO TBEC: 81.0000 mg | DELAYED_RELEASE_TABLET | Freq: Two times a day (BID) | ORAL | 0 refills | Status: DC

## 2017-10-30 MED ORDER — ONDANSETRON HCL 4 MG/2ML IJ SOLN
INTRAMUSCULAR | Status: DC | PRN
  Administered 2017-10-30: 4 mg via INTRAVENOUS

## 2017-10-30 MED ORDER — FAMOTIDINE 20 MG PO TABS: 20.0000 mg | ORAL_TABLET | Freq: Two times a day (BID) | ORAL | Status: DC

## 2017-10-30 MED ORDER — ALUM & MAG HYDROXIDE-SIMETH 200-200-20 MG/5ML PO SUSP: 30.0000 mL | ORAL | Status: DC | PRN

## 2017-10-30 MED ORDER — SODIUM CHLORIDE 0.9 % IJ SOLN
INTRAMUSCULAR | Status: DC | PRN
  Administered 2017-10-30: 50 mL

## 2017-10-30 MED ORDER — METHOCARBAMOL 1000 MG/10ML IJ SOLN
500.0000 mg | Freq: Four times a day (QID) | INTRAVENOUS | Status: DC | PRN
  Filled 2017-10-30: qty 5

## 2017-10-30 MED ORDER — METOCLOPRAMIDE HCL 5 MG/ML IJ SOLN
5.0000 mg | Freq: Three times a day (TID) | INTRAMUSCULAR | Status: DC | PRN
  Administered 2017-10-30: 5 mg via INTRAVENOUS
  Filled 2017-10-30 (×2): qty 2

## 2017-10-30 MED ORDER — BUPIVACAINE-EPINEPHRINE (PF) 0.25% -1:200000 IJ SOLN
INTRAMUSCULAR | Status: AC
  Filled 2017-10-30: qty 30

## 2017-10-30 MED ORDER — PHENYLEPHRINE 40 MCG/ML (10ML) SYRINGE FOR IV PUSH (FOR BLOOD PRESSURE SUPPORT)
PREFILLED_SYRINGE | INTRAVENOUS | Status: DC | PRN
  Administered 2017-10-30 (×2): 80 ug via INTRAVENOUS
  Administered 2017-10-30: 40 ug via INTRAVENOUS
  Administered 2017-10-30 (×2): 80 ug via INTRAVENOUS
  Administered 2017-10-30: 120 ug via INTRAVENOUS
  Administered 2017-10-30 (×2): 80 ug via INTRAVENOUS

## 2017-10-30 MED ORDER — KCL IN DEXTROSE-NACL 20-5-0.45 MEQ/L-%-% IV SOLN
INTRAVENOUS | Status: DC
  Administered 2017-10-30 (×2): 100 mL/h via INTRAVENOUS
  Filled 2017-10-30: qty 1000

## 2017-10-30 MED ORDER — CEFAZOLIN SODIUM-DEXTROSE 2-4 GM/100ML-% IV SOLN
INTRAVENOUS | Status: AC
  Filled 2017-10-30: qty 100

## 2017-10-30 MED ORDER — LIDOCAINE 2% (20 MG/ML) 5 ML SYRINGE
INTRAMUSCULAR | Status: DC | PRN
  Administered 2017-10-30: 40 mg via INTRAVENOUS

## 2017-10-30 MED ORDER — PANTOPRAZOLE SODIUM 40 MG PO TBEC
40.0000 mg | DELAYED_RELEASE_TABLET | Freq: Every day | ORAL | Status: DC
  Administered 2017-10-31 – 2017-11-01 (×2): 40 mg via ORAL
  Filled 2017-10-30 (×2): qty 1

## 2017-10-30 MED ORDER — POLYETHYLENE GLYCOL 3350 17 G PO PACK: 17.0000 g | PACK | Freq: Every day | ORAL | Status: DC | PRN

## 2017-10-30 MED ORDER — FENTANYL CITRATE (PF) 100 MCG/2ML IJ SOLN
50.0000 ug | Freq: Once | INTRAMUSCULAR | Status: AC
  Administered 2017-10-30: 50 ug via INTRAVENOUS

## 2017-10-30 MED ORDER — ONDANSETRON HCL 4 MG/2ML IJ SOLN
4.0000 mg | Freq: Four times a day (QID) | INTRAMUSCULAR | Status: DC | PRN
  Administered 2017-10-30: 4 mg via INTRAVENOUS
  Filled 2017-10-30 (×2): qty 2

## 2017-10-30 MED ORDER — PHENOL 1.4 % MT LIQD: 1.0000 | OROMUCOSAL | Status: DC | PRN

## 2017-10-30 MED ORDER — MIDAZOLAM HCL 2 MG/2ML IJ SOLN
INTRAMUSCULAR | Status: AC
  Filled 2017-10-30: qty 2

## 2017-10-30 MED ORDER — METOCLOPRAMIDE HCL 5 MG PO TABS
5.0000 mg | ORAL_TABLET | Freq: Three times a day (TID) | ORAL | Status: DC | PRN
Start: 2017-10-30 — End: 2017-11-01
  Administered 2017-10-30: 10 mg via ORAL

## 2017-10-30 MED ORDER — OXYCODONE HCL 5 MG PO TABS
ORAL_TABLET | ORAL | Status: AC
  Administered 2017-10-30: 5 mg via ORAL
  Filled 2017-10-30: qty 1

## 2017-10-30 MED ORDER — OXYCODONE HCL 5 MG PO TABS
5.0000 mg | ORAL_TABLET | Freq: Once | ORAL | Status: AC | PRN
  Administered 2017-10-30: 5 mg via ORAL

## 2017-10-30 MED ORDER — HYDROMORPHONE HCL 2 MG/ML IJ SOLN: 0.5000 mg | INTRAMUSCULAR | Status: DC | PRN

## 2017-10-30 SURGICAL SUPPLY — 48 items
BANDAGE ESMARK 6X9 LF (GAUZE/BANDAGES/DRESSINGS) ×1 IMPLANT
BLADE SAG 18X100X1.27 (BLADE) ×2 IMPLANT
BLADE SAGITTAL 13X1.27X60 (BLADE) IMPLANT
BLADE SAW SGTL 13X75X1.27 (BLADE) IMPLANT
BNDG ELASTIC 6X10 VLCR STRL LF (GAUZE/BANDAGES/DRESSINGS) ×2 IMPLANT
BNDG ESMARK 6X9 LF (GAUZE/BANDAGES/DRESSINGS) ×2
BOWL SMART MIX CTS (DISPOSABLE) ×2 IMPLANT
CAPT KNEE TOTAL 3 ATTUNE ×2 IMPLANT
CEMENT HV SMART SET (Cement) ×4 IMPLANT
COVER SURGICAL LIGHT HANDLE (MISCELLANEOUS) ×2 IMPLANT
CUFF TOURNIQUET SINGLE 34IN LL (TOURNIQUET CUFF) ×2 IMPLANT
CUFF TOURNIQUET SINGLE 44IN (TOURNIQUET CUFF) IMPLANT
DRAPE EXTREMITY T 121X128X90 (DRAPE) ×2 IMPLANT
DRAPE U-SHAPE 47X51 STRL (DRAPES) ×2 IMPLANT
DRSG AQUACEL AG ADV 3.5X10 (GAUZE/BANDAGES/DRESSINGS) ×2 IMPLANT
DURAPREP 26ML APPLICATOR (WOUND CARE) ×2 IMPLANT
ELECT REM PT RETURN 9FT ADLT (ELECTROSURGICAL) ×2
ELECTRODE REM PT RTRN 9FT ADLT (ELECTROSURGICAL) ×1 IMPLANT
GLOVE BIO SURGEON STRL SZ7.5 (GLOVE) ×2 IMPLANT
GLOVE BIO SURGEON STRL SZ8.5 (GLOVE) ×2 IMPLANT
GLOVE BIOGEL PI IND STRL 8 (GLOVE) ×1 IMPLANT
GLOVE BIOGEL PI IND STRL 9 (GLOVE) ×1 IMPLANT
GLOVE BIOGEL PI INDICATOR 8 (GLOVE) ×1
GLOVE BIOGEL PI INDICATOR 9 (GLOVE) ×1
GOWN STRL REUS W/ TWL LRG LVL3 (GOWN DISPOSABLE) ×1 IMPLANT
GOWN STRL REUS W/ TWL XL LVL3 (GOWN DISPOSABLE) ×2 IMPLANT
GOWN STRL REUS W/TWL LRG LVL3 (GOWN DISPOSABLE) ×1
GOWN STRL REUS W/TWL XL LVL3 (GOWN DISPOSABLE) ×2
HANDPIECE INTERPULSE COAX TIP (DISPOSABLE) ×1
HOOD PEEL AWAY FACE SHEILD DIS (HOOD) ×4 IMPLANT
KIT BASIN OR (CUSTOM PROCEDURE TRAY) ×2 IMPLANT
KIT TURNOVER KIT B (KITS) ×2 IMPLANT
MANIFOLD NEPTUNE II (INSTRUMENTS) ×2 IMPLANT
NEEDLE 22X1 1/2 (OR ONLY) (NEEDLE) ×4 IMPLANT
NS IRRIG 1000ML POUR BTL (IV SOLUTION) ×2 IMPLANT
PACK TOTAL JOINT (CUSTOM PROCEDURE TRAY) ×2 IMPLANT
PAD ARMBOARD 7.5X6 YLW CONV (MISCELLANEOUS) ×4 IMPLANT
SET HNDPC FAN SPRY TIP SCT (DISPOSABLE) ×1 IMPLANT
SUT VIC AB 1 CTX 36 (SUTURE) ×1
SUT VIC AB 1 CTX36XBRD ANBCTR (SUTURE) ×1 IMPLANT
SUT VIC AB 2-0 CT1 27 (SUTURE) ×1
SUT VIC AB 2-0 CT1 TAPERPNT 27 (SUTURE) ×1 IMPLANT
SUT VIC AB 3-0 CT1 27 (SUTURE) ×1
SUT VIC AB 3-0 CT1 TAPERPNT 27 (SUTURE) ×1 IMPLANT
SYR CONTROL 10ML LL (SYRINGE) ×4 IMPLANT
TOWEL OR 17X24 6PK STRL BLUE (TOWEL DISPOSABLE) ×2 IMPLANT
TOWEL OR 17X26 10 PK STRL BLUE (TOWEL DISPOSABLE) ×2 IMPLANT
TRAY CATH 16FR W/PLASTIC CATH (SET/KITS/TRAYS/PACK) IMPLANT

## 2017-10-30 NOTE — Discharge Instructions (Signed)

## 2017-10-30 NOTE — Anesthesia Procedure Notes (Signed)
Procedure Name: LMA Insertion Date/Time: 10/30/2017 11:27 AM Performed by: Army FossaPulliam, Aivah Putman Dane, CRNA Pre-anesthesia Checklist: Patient identified, Emergency Drugs available, Suction available and Patient being monitored Patient Re-evaluated:Patient Re-evaluated prior to induction Oxygen Delivery Method: Circle System Utilized Preoxygenation: Pre-oxygenation with 100% oxygen Induction Type: IV induction Ventilation: Mask ventilation without difficulty LMA: LMA inserted LMA Size: 4.0 Number of attempts: 1 Placement Confirmation: positive ETCO2 Tube secured with: Tape Dental Injury: Teeth and Oropharynx as per pre-operative assessment

## 2017-10-30 NOTE — Transfer of Care (Signed)
Immediate Anesthesia Transfer of Care Note  Patient: Kristy Mejia  Procedure(s) Performed: RIGHT TOTAL KNEE ARTHROPLASTY (Right Knee)  Patient Location: PACU  Anesthesia Type:GA combined with regional for post-op pain  Level of Consciousness: drowsy and patient cooperative  Airway & Oxygen Therapy: Patient Spontanous Breathing and Patient connected to face mask oxygen  Post-op Assessment: Report given to RN, Post -op Vital signs reviewed and stable and Patient moving all extremities X 4  Post vital signs: Reviewed and stable  Last Vitals:  Vitals Value Taken Time  BP 103/59 10/30/2017  1:10 PM  Temp    Pulse 82 10/30/2017  1:12 PM  Resp 20 10/30/2017  1:12 PM  SpO2 100 % 10/30/2017  1:12 PM  Vitals shown include unvalidated device data.  Last Pain:  Vitals:   10/30/17 0938  TempSrc: Oral  PainSc:          Complications: No apparent anesthesia complications

## 2017-10-30 NOTE — OR Nursing (Signed)
1255:  In&out cath=350cc cyu, per protocol.

## 2017-10-30 NOTE — Anesthesia Postprocedure Evaluation (Signed)
Anesthesia Post Note  Patient: Kelce S Coggeshall  Procedure(s) Performed: RIGHT TOTAL KNEE ARTHROPLASTY (Right Knee)     Patient location during evaluation: PACU Anesthesia Type: General Level of consciousness: oriented and awake and alert Pain management: pain level controlled Vital Signs Assessment: post-procedure vital signs reviewed and stable Respiratory status: spontaneous breathing, respiratory function stable and patient connected to nasal cannula oxygen Cardiovascular status: blood pressure returned to baseline and stable Postop Assessment: no headache, no backache and no apparent nausea or vomiting Anesthetic complications: no    Last Vitals:  Vitals:   10/30/17 1530 10/30/17 1540  BP:  (!) 98/57  Pulse: (!) 48 (!) 55  Resp: 12 13  Temp:  (!) 36.4 C  SpO2: 98% 99%    Last Pain:  Vitals:   10/30/17 1540  TempSrc: Axillary  PainSc:                  Shekelia Boutin COKER

## 2017-10-30 NOTE — Op Note (Signed)
PATIENT ID:      Kristy Mejia  MRN:     [id number] DOB/AGE:    09-13-1945 / 72 y.o.       OPERATIVE REPORT    DATE OF PROCEDURE:  10/30/2017       PREOPERATIVE DIAGNOSIS:   RIGHT KNEE OSTEOARTHRITIS      Estimated body mass index is 33.12 kg/m as calculated from the following:   Height as of this encounter:  (1.575 m).   Weight as of this encounter: 181 lb 1.6 oz (82.1 kg).                                                        POSTOPERATIVE DIAGNOSIS:   RIGHT KNEE OSTEOARTHRITIS                                                                      PROCEDURE:  Procedure(s): RIGHT TOTAL KNEE ARTHROPLASTY Using DepuyAttune RP implants #4R Femur, #5Tibia, 5 mm Attune RP bearing, 35 Patella     SURGEON: Nestor Lewandowsky    ASSISTANT:   Tomi Likens. Gaylene Brooks   (Present and scrubbed throughout the case, critical for assistance with exposure, retraction, instrumentation, and closure.)         ANESTHESIA: GLMA, 20cc Exparel, 50cc 0.25% Marcaine  EBL: 250cc  FLUID REPLACEMENT: 1500cc crystalloid  TOURNIQUET TIME:  Drains: None  Tranexamic Acid: 1gm IV, 2gm topical  COMPLICATIONS:  None         INDICATIONS FOR PROCEDURE: The patient has  RIGHT KNEE OSTEOARTHRITIS, Val deformities, XR shows bone on bone arthritis, lateral subluxation of tibia. Patient has failed all conservative measures including anti-inflammatory medicines, narcotics, attempts at  exercise and weight loss, cortisone injections and viscosupplementation.  Risks and benefits of surgery have been discussed, questions answered.   DESCRIPTION OF PROCEDURE: The patient identified by armband, received  IV antibiotics, in the holding area at [facility]. Patient taken to the operating room, appropriate anesthetic  monitors were attached, and GLMA anesthesia was  induced. Tourniquet  applied high to the operative thigh. Lateral post and foot positioner  applied to the table, the lower extremity was then prepped and  draped  in usual sterile fashion from the toes to the tourniquet. Time-out procedure was performed. We began the operation, with the knee flexed 120 degrees, by making the anterior midline incision starting at handbreadth above the patella going over the patella 1 cm medial to and 4 cm distal to the tibial tubercle. Small bleeders in the skin and the  subcutaneous tissue identified and cauterized. Transverse retinaculum was incised and reflected medially and a medial parapatellar arthrotomy was accomplished. the patella was everted and theprepatellar fat pad resected. The superficial medial collateral  ligament was then elevated from anterior to posterior along the proximal  flare of the tibia and anterior half of the menisci resected. The knee was hyperflexed exposing bone on bone arthritis. Peripheral and notch osteophytes as well as the cruciate ligaments were then resected. We continued to  work our way around posteriorly  along the proximal tibia, and externally  rotated the tibia subluxing it out from underneath the femur. A McHale  retractor was placed through the notch and a lateral Hohmann retractor  placed, and we then drilled through the proximal tibia in line with the  axis of the tibia followed by an intramedullary guide rod and 2-degree  posterior slope cutting guide. The tibial cutting guide, 3 degree posterior sloped, was pinned into place allowing resection of 8 mm of bone medially and 0 mm of bone laterally. Satisfied with the tibial resection, we then  entered the distal femur 2 mm anterior to the PCL origin with the  intramedullary guide rod and applied the distal femoral cutting guide  set at 9 mm, with 5 degrees of valgus. This was pinned along the  epicondylar axis. At this point, the distal femoral cut was accomplished without difficulty. We then sized for a #4R femoral component and pinned the guide in 0 degrees of external rotation. The chamfer cutting guide was pinned into  place. The anterior, posterior, and chamfer cuts were accomplished without difficulty followed by  the Attune RP box cutting guide and the box cut. We also removed posterior osteophytes from the posterior femoral condyles. At this  time, the knee was brought into full extension. We checked our  extension and flexion gaps and found them symmetric for a 5 mm bearing. Distracting in extension with a lamina spreader, the posterior horns of the menisci were removed, and Exparel, diluted to 60 cc, with 20cc NS, and 20cc 0.5% Marcaine,was injected into the capsule and synovium of the knee. The posterior patella cut was accomplished with the 9.5 mm Attune cutting guide, sized for a 35mm dome, and the fixation pegs drilled.The knee  was then once again hyperflexed exposing the proximal tibia. We sized for a # 5 tibial base plate, applied the smokestack and the conical reamer followed by the the Delta fin keel punch. We then hammered into place the Attune RP trial femoral component, drilled the lugs, inserted a  5 mm trial bearing, trial patellar button, and took the knee through range of motion from 0-130 degrees. No thumb pressure was required for patellar Tracking. At this point, the limb was wrapped with an Esmarch bandage and the tourniquet inflated to 350 mmHg. All trial components were removed, mating surfaces irrigated with pulse lavage, and dried with suction and sponges. 10 cc of the Exparel solution was applied to the cancellus bone of the patella distal femur and proximal tibia.  After waiting 1 minute, the bony surfaces were again, dried with sponges. A double batch of DePuy HV cement with 1500 mg of Zinacef was mixed and applied to all bony metallic mating surfaces except for the posterior condyles of the femur itself. In order, we hammered into place the tibial tray and removed excess cement, the femoral component and removed excess cement. The final Attune RP bearing  was inserted, and the knee brought  to full extension with compression.  The patellar button was clamped into place, and excess cement  removed. While the cement cured the wound was irrigated out with normal saline solution pulse lavage. Ligament stability and patellar tracking were checked and found to be excellent. The parapatellar arthrotomy was closed with  running #1 Vicryl suture. The subcutaneous tissue with 0 and 2-0 undyed  Vicryl suture, and the skin with running 3-0 SQ vicryl. A dressing of Xeroform,  4 x 4, dressing sponges, Webril, and Ace wrap applied. The patient  awakened, and taken to recovery room without difficulty.   Nestor Lewandowsky 10/30/2017, 12:32 PM

## 2017-10-30 NOTE — Interval H&P Note (Signed)
History and Physical Interval Note:  10/30/2017 10:29 AM  Kristy Mejia  has presented today for surgery, with the diagnosis of RIGHT KNEE OSTEOARTHRITIS  The various methods of treatment have been discussed with the patient and family. After consideration of risks, benefits and other options for treatment, the patient has consented to  Procedure(s): RIGHT TOTAL KNEE ARTHROPLASTY (Right) as a surgical intervention .  The patient's history has been reviewed, patient examined, no change in status, stable for surgery.  I have reviewed the patient's chart and labs.  Questions were answered to the patient's satisfaction.     Nestor LewandowskyFrank J Shaily Librizzi

## 2017-10-30 NOTE — Anesthesia Procedure Notes (Addendum)
Anesthesia Regional Block: Adductor canal block   Pre-Anesthetic Checklist: ,, timeout performed, Correct Patient, Correct Site, Correct Laterality, Correct Procedure, Correct Position, site marked, Risks and benefits discussed, pre-op evaluation,  At surgeon's request and post-op pain management  Laterality: Right  Prep: Maximum Sterile Barrier Precautions used, chloraprep       Needles:  Injection technique: Single-shot  Needle Type: Echogenic Stimulator Needle     Needle Length: 9cm  Needle Gauge: 21     Additional Needles:   Procedures:,,,, ultrasound used (permanent image in chart),,,,  Narrative:  Start time: 10/30/2017 10:05 AM End time: 10/30/2017 10:15 AM Injection made incrementally with aspirations every 5 mL.  Performed by: Personally  Anesthesiologist: Kipp BroodJoslin, Lamae Fosco, MD  Additional Notes: 20 cc 0.75% Ropivacaine 0.3 mg clonidine

## 2017-10-30 NOTE — Progress Notes (Signed)
Orthopedic Tech Progress Note Patient Details:  Kristy LagerJinx S Mejia October 20, 1945 [id number]  Ortho Devices Type of Ortho Device: Bone foam zero knee Ortho Device/Splint Location: rle Ortho Device/Splint Interventions: Application   Post Interventions Patient Tolerated: Well Instructions Provided: Care of device   Nikki DomCrawford, Jennessa Trigo 10/30/2017, 2:38 PM

## 2017-10-30 NOTE — Anesthesia Preprocedure Evaluation (Signed)
Anesthesia Evaluation  Patient identified by MRN, date of birth, ID band Patient awake    Reviewed: Allergy & Precautions, NPO status , Patient's Chart, lab work & pertinent test results  Airway Mallampati: II  TM Distance: >3 FB Neck ROM: Full    Dental  (+) Teeth Intact, Dental Advisory Given   Pulmonary    breath sounds clear to auscultation       Cardiovascular  Rhythm:Regular Rate:Normal     Neuro/Psych    GI/Hepatic   Endo/Other    Renal/GU      Musculoskeletal   Abdominal   Peds  Hematology   Anesthesia Other Findings   Reproductive/Obstetrics                             Anesthesia Physical Anesthesia Plan  ASA: II  Anesthesia Plan: General   Post-op Pain Management:  Regional for Post-op pain   Induction: Intravenous  PONV Risk Score and Plan: Ondansetron  Airway Management Planned: LMA  Additional Equipment:   Intra-op Plan:   Post-operative Plan:   Informed Consent: I have reviewed the patients History and Physical, chart, labs and discussed the procedure including the risks, benefits and alternatives for the proposed anesthesia with the patient or authorized representative who has indicated his/her understanding and acceptance.   Dental advisory given  Plan Discussed with: CRNA and Anesthesiologist  Anesthesia Plan Comments:         Anesthesia Quick Evaluation

## 2017-10-31 DIAGNOSIS — M1711 Unilateral primary osteoarthritis, right knee: Secondary | ICD-10-CM | POA: Diagnosis not present

## 2017-10-31 LAB — CBC
HCT: 38.8 % (ref 36.0–46.0)
Hemoglobin: 12.3 g/dL (ref 12.0–15.0)
MCH: 29.8 pg (ref 26.0–34.0)
MCHC: 31.7 g/dL (ref 30.0–36.0)
MCV: 93.9 fL (ref 78.0–100.0)
PLATELETS: 205 10*3/uL (ref 150–400)
RBC: 4.13 MIL/uL (ref 3.87–5.11)
RDW: 13.2 % (ref 11.5–15.5)
WBC: 8.5 10*3/uL (ref 4.0–10.5)

## 2017-10-31 LAB — BASIC METABOLIC PANEL
Anion gap: 6 (ref 5–15)
BUN: 10 mg/dL (ref 6–20)
CALCIUM: 9.1 mg/dL (ref 8.9–10.3)
CO2: 26 mmol/L (ref 22–32)
CREATININE: 0.77 mg/dL (ref 0.44–1.00)
Chloride: 105 mmol/L (ref 101–111)
GLUCOSE: 183 mg/dL — AB (ref 65–99)
Potassium: 3.9 mmol/L (ref 3.5–5.1)
Sodium: 137 mmol/L (ref 135–145)

## 2017-10-31 NOTE — Progress Notes (Signed)
At around 2200 on 10-30-17, patient kept trying to urinate on her own but was unable to. Bladder scanned patient - 317ml shown. Patient wanted to continue trying but was unable to. I&O catherized patient - 500ml output. Continue to monitor.

## 2017-10-31 NOTE — Progress Notes (Signed)
Subjective: 1 Day Post-Op Procedure(s) (LRB): RIGHT TOTAL KNEE ARTHROPLASTY (Right)   Patient is feeline well. They had to in and out cath last night but now she is urinating on her won.  Activity level:  wbat Diet tolerance:  ok Voiding:  ok Patient reports pain as mild and moderate.    Objective: Vital signs in last 24 hours: Temp:  [97.2 F (36.2 C)-98.3 F (36.8 C)] 98.3 F (36.8 C) (06/15 0358) Pulse Rate:  [44-85] 58 (06/15 0358) Resp:  [10-24] 15 (06/15 0358) BP: (88-171)/(52-80) 99/57 (06/15 0358) SpO2:  [91 %-100 %] 99 % (06/15 0358) Weight:  [82.1 kg (181 lb 1.6 oz)] 82.1 kg (181 lb 1.6 oz) (06/14 0938)  Labs: Recent Labs    10/31/17 0606  HGB 12.3   Recent Labs    10/31/17 0606  WBC 8.5  RBC 4.13  HCT 38.8  PLT 205   Recent Labs    10/31/17 0606  NA 137  K 3.9  CL 105  CO2 26  BUN 10  CREATININE 0.77  GLUCOSE 183*  CALCIUM 9.1   No results for input(s): LABPT, INR in the last 72 hours.  Physical Exam:  Neurologically intact ABD soft Neurovascular intact Sensation intact distally Intact pulses distally Dorsiflexion/Plantar flexion intact Incision: dressing C/D/I and no drainage No cellulitis present Compartment soft  Assessment/Plan:  1 Day Post-Op Procedure(s) (LRB): RIGHT TOTAL KNEE ARTHROPLASTY (Right) Advance diet Up with therapy Plan for discharge tomorrow Discharge home with home health if doing well and cleared by PT. Continue on ASA for DVT prevention. Follow up in office 2 weeks post op with Dr. Turner Danielsowan .Anticipated LOS equal to or greater than 2 midnights due to - Age 70 and older with one or more of the following:  - Obesity  - Expected need for hospital services (PT, OT, Nursing) required for safe  discharge  - Anticipated need for postoperative skilled nursing care or inpatient rehab  - Active co-morbidities: None OR   - Unanticipated findings during/Post Surgery: Slow post-op progression: GI, pain control, mobility   - Patient is a high risk of re-admission due to: None  Jenice Leiner, Ginger OrganNDREW PAUL 10/31/2017, 7:40 AM

## 2017-10-31 NOTE — Evaluation (Signed)
Physical Therapy Evaluation Patient Details Name: Kristy Mejia S Zumstein MRN: [id number] DOB: 03-31-1946 Today's Date: 10/31/2017   History of Present Illness  Pt is a 72 y/o female s/p R TKA. PMH including but not limited to HLD and scoliosis.  Clinical Impression  Pt presented supine in bed with HOB elevated, awake and willing to participate in therapy session. Prior to admission, pt reported that she was independent with all functional mobility and ADLs. Pt lives with her husband in a single level home with two steps to enter. Pt will have 24/7 supervision/assistance from husband upon d/c. Pt currently able to perform bed mobility with supervision, transfers with min guard and ambulate in hallway with RW and min guard. Pt would continue to benefit from skilled physical therapy services at this time while admitted and after d/c to address the below listed limitations in order to improve overall safety and independence with functional mobility.     Follow Up Recommendations Home health PT;Supervision/Assistance - 24 hour    Equipment Recommendations  None recommended by PT    Recommendations for Other Services       Precautions / Restrictions Precautions Precautions: Fall;Knee Precaution Comments: Reviewed positioning of knee following TKA sx with pt. Pt expressed understanding. Restrictions Weight Bearing Restrictions: Yes RLE Weight Bearing: Weight bearing as tolerated      Mobility  Bed Mobility Overal bed mobility: Needs Assistance Bed Mobility: Supine to Sit     Supine to sit: Supervision     General bed mobility comments: increased time and effort, supervision for safety  Transfers Overall transfer level: Needs assistance Equipment used: Rolling walker (2 wheeled) Transfers: Sit to/from Stand Sit to Stand: Min guard         General transfer comment: increased time, cueing for safe hand placement, min guard for safety  Ambulation/Gait Ambulation/Gait assistance: Min  guard Gait Distance (Feet): 50 Feet Assistive device: Rolling walker (2 wheeled) Gait Pattern/deviations: Step-to pattern;Decreased step length - right;Decreased step length - left;Decreased stride length;Decreased weight shift to right Gait velocity: decreased Gait velocity interpretation: <1.31 ft/sec, indicative of household ambulator General Gait Details: cueing for sequencing with RW, min guard for safety, pt limited secondary to pain and fatigue  Stairs            Wheelchair Mobility    Modified Rankin (Stroke Patients Only)       Balance Overall balance assessment: Needs assistance Sitting-balance support: Feet supported Sitting balance-Leahy Scale: Good     Standing balance support: During functional activity;Bilateral upper extremity supported Standing balance-Leahy Scale: Poor                               Pertinent Vitals/Pain Pain Assessment: 0-10 Pain Score: 5  Pain Location: R knee Pain Descriptors / Indicators: Sore Pain Intervention(s): Monitored during session;Repositioned    Home Living Family/patient expects to be discharged to:: Private residence Living Arrangements: Spouse/significant other Available Help at Discharge: Family;Available 24 hours/day Type of Home: House Home Access: Stairs to enter Entrance Stairs-Rails: None Entrance Stairs-Number of Steps: 2 Home Layout: One level Home Equipment: Cane - single point;Walker - 2 wheels;Bedside commode      Prior Function Level of Independence: Independent               Hand Dominance        Extremity/Trunk Assessment   Upper Extremity Assessment Upper Extremity Assessment: Overall WFL for tasks assessed    Lower Extremity  Assessment Lower Extremity Assessment: RLE deficits/detail RLE Deficits / Details: pt with decreased strength and ROM limitations secondary to post-op pain and weakness.     Cervical / Trunk Assessment Cervical / Trunk Assessment: Kyphotic   Communication   Communication: No difficulties  Cognition Arousal/Alertness: Awake/alert Behavior During Therapy: WFL for tasks assessed/performed Overall Cognitive Status: Within Functional Limits for tasks assessed                                        General Comments      Exercises Total Joint Exercises Quad Sets: AROM;Strengthening;Right;10 reps;Seated Gluteal Sets: AROM;Strengthening;Both;10 reps;Seated Short Arc Quad: AROM;Strengthening;Right;10 reps;Seated   Assessment/Plan    PT Assessment Patient needs continued PT services  PT Problem List Decreased strength;Decreased range of motion;Decreased activity tolerance;Decreased balance;Decreased mobility;Decreased coordination;Decreased knowledge of use of DME;Decreased safety awareness;Decreased knowledge of precautions;Pain       PT Treatment Interventions DME instruction;Gait training;Stair training;Functional mobility training;Therapeutic exercise;Therapeutic activities;Balance training;Neuromuscular re-education;Patient/family education    PT Goals (Current goals can be found in the Care Plan section)  Acute Rehab PT Goals Patient Stated Goal: return home PT Goal Formulation: With patient Time For Goal Achievement: 11/14/17 Potential to Achieve Goals: Good    Frequency 7X/week   Barriers to discharge        Co-evaluation               AM-PAC PT "6 Clicks" Daily Activity  Outcome Measure Difficulty turning over in bed (including adjusting bedclothes, sheets and blankets)?: A Little Difficulty moving from lying on back to sitting on the side of the bed? : A Little Difficulty sitting down on and standing up from a chair with arms (e.g., wheelchair, bedside commode, etc,.)?: Unable Help needed moving to and from a bed to chair (including a wheelchair)?: A Little Help needed walking in hospital room?: A Little Help needed climbing 3-5 steps with a railing? : A Little 6 Click Score: 16     End of Session Equipment Utilized During Treatment: Gait belt Activity Tolerance: Patient tolerated treatment well Patient left: in chair;with call bell/phone within reach Nurse Communication: Mobility status PT Visit Diagnosis: Other abnormalities of gait and mobility (R26.89);Pain Pain - Right/Left: Right Pain - part of body: Knee    Time: 4098-1191 PT Time Calculation (min) (ACUTE ONLY): 23 min   Charges:   PT Evaluation $PT Eval Moderate Complexity: 1 Mod PT Treatments $Gait Training: 8-22 mins   PT G Codes:        [place], PT, DPT [phone]   Alessandra Bevels Creg Gilmer 10/31/2017, 10:37 AM

## 2017-10-31 NOTE — Progress Notes (Addendum)
Physical Therapy Treatment Patient Details Name: Kristy Mejia MRN: [id number] DOB: [birth year] Today's Date: 10/31/2017    History of Present Illness Pt is a 72 y/o female s/p R TKA. PMH including but not limited to HLD and scoliosis.    PT Comments    Pt making steady progress with functional mobility. Will need stair training prior to d/c home. Pt would continue to benefit from skilled physical therapy services at this time while admitted and after d/c to address the below listed limitations in order to improve overall safety and independence with functional mobility.  Goniometric ROM (measured in sitting):  Flexion = 85 degrees Extension = 0 degrees     Follow Up Recommendations  Home health PT;Supervision/Assistance - 24 hour     Equipment Recommendations  None recommended by PT    Recommendations for Other Services       Precautions / Restrictions Precautions Precautions: Fall;Knee Precaution Comments: Reviewed positioning of knee following TKA sx with pt. Pt expressed understanding. Restrictions Weight Bearing Restrictions: Yes RLE Weight Bearing: Weight bearing as tolerated    Mobility  Bed Mobility               General bed mobility comments: pt exiting bathroom upon arrival  Transfers Overall transfer level: Needs assistance Equipment used: Rolling walker (2 wheeled) Transfers: Sit to/from Stand Sit to Stand: Supervision         General transfer comment: increased time, good technique, supervision for safety  Ambulation/Gait Ambulation/Gait assistance: Min guard Gait Distance (Feet): 100 Feet Assistive device: Rolling walker (2 wheeled) Gait Pattern/deviations: Step-to pattern;Decreased step length - right;Decreased step length - left;Decreased stride length;Decreased weight shift to right;Step-through pattern Gait velocity: decreased Gait velocity interpretation: <1.31 ft/sec, indicative of household ambulator General Gait Details: min guard  for safety, pt progressing to step-through gait pattern with increase in L LE step length with practice. pt steady with RW.   Stairs             Wheelchair Mobility    Modified Rankin (Stroke Patients Only)       Balance Overall balance assessment: Needs assistance Sitting-balance support: Feet supported Sitting balance-Leahy Scale: Good     Standing balance support: During functional activity;Bilateral upper extremity supported Standing balance-Leahy Scale: Poor                              Cognition Arousal/Alertness: Awake/alert Behavior During Therapy: WFL for tasks assessed/performed Overall Cognitive Status: Within Functional Limits for tasks assessed                                        Exercises Total Joint Exercises Goniometric ROM: Flexion = 85 degrees, Extension = 0 degrees; measured in sitting    General Comments        Pertinent Vitals/Pain Pain Assessment: Faces Faces Pain Scale: Hurts a little bit Pain Location: R knee Pain Descriptors / Indicators: Sore Pain Intervention(s): Monitored during session;Repositioned;Patient requesting pain meds-RN notified    Home Living                      Prior Function            PT Goals (current goals can now be found in the care plan section) Acute Rehab PT Goals PT Goal Formulation: With patient Time For Goal  Achievement: 11/14/17 Potential to Achieve Goals: Good Progress towards PT goals: Progressing toward goals    Frequency    7X/week      PT Plan Current plan remains appropriate    Co-evaluation              AM-PAC PT "6 Clicks" Daily Activity  Outcome Measure  Difficulty turning over in bed (including adjusting bedclothes, sheets and blankets)?: A Little Difficulty moving from lying on back to sitting on the side of the bed? : A Little Difficulty sitting down on and standing up from a chair with arms (e.g., wheelchair, bedside commode,  etc,.)?: Unable Help needed moving to and from a bed to chair (including a wheelchair)?: A Little Help needed walking in hospital room?: A Little Help needed climbing 3-5 steps with a railing? : A Little 6 Click Score: 16    End of Session   Activity Tolerance: Patient tolerated treatment well Patient left: in chair;with call bell/phone within reach Nurse Communication: Mobility status PT Visit Diagnosis: Other abnormalities of gait and mobility (R26.89);Pain Pain - Right/Left: Right Pain - part of body: Knee     Time: [id number] PT Time Calculation (min) (ACUTE ONLY): 12 min  Charges:  $Gait Training: 8-22 mins                    G Codes:       [place] RanchJennifer Brynnlie Unterreiner, [place]PT, [place]PT [phone]    Alessandra BevelsJennifer M Denman Pichardo 10/31/2017, 5:21 PM

## 2017-11-01 DIAGNOSIS — M1711 Unilateral primary osteoarthritis, right knee: Secondary | ICD-10-CM | POA: Diagnosis not present

## 2017-11-01 LAB — CBC
HCT: 36.4 % (ref 36.0–46.0)
Hemoglobin: 11.4 g/dL — ABNORMAL LOW (ref 12.0–15.0)
MCH: 29.4 pg (ref 26.0–34.0)
MCHC: 31.3 g/dL (ref 30.0–36.0)
MCV: 93.8 fL (ref 78.0–100.0)
Platelets: 199 10*3/uL (ref 150–400)
RBC: 3.88 MIL/uL (ref 3.87–5.11)
RDW: 13.5 % (ref 11.5–15.5)
WBC: 6.2 10*3/uL (ref 4.0–10.5)

## 2017-11-01 NOTE — Discharge Summary (Signed)
Patient ID: Kristy Mejia MRN: [id number] DOB/AGE: 70/08/1945 70 y.o.  Admit date: 10/30/2017 Discharge date: 11/01/2017  Admission Diagnoses:  Principal Problem:   Osteoarthritis of right knee Active Problems:   Primary osteoarthritis of right knee   Discharge Diagnoses:  Same  Past Medical History:  Diagnosis Date  . Arthritis   . Atypical chest pain    a. 04/2015 Myoview: EF 78%, breast attenuation, no ischemia-->Low risk.  . Back pain    scoliosis  . Chicken pox   . Dysrhythmia    hx palpitations  . GERD (gastroesophageal reflux disease)   . History of hiatal hernia    noted on cxr  . Hyperlipidemia   . Joint pain   . Obesity, unspecified   . Paroxysmal SVT (supraventricular tachycardia) (HCC)    a. 2013 Holter: PACs/PVCs; b. 10/2011 Ehco: EF nl, no rwma, mild LVH, mild TR, PASP 34mmHg; c 04/2015 SVT in ED->resolved with adenosine; c.   . Pneumonia 2010   hx of  . Scoliosis    multiple areas of back follows with Dr. Dyke MaesKen Brown chiropractor     Surgeries: Procedure(s): RIGHT TOTAL KNEE ARTHROPLASTY on 10/30/2017   Consultants:   Discharged Condition: Improved  Hospital Course: Kristy Mejia is an 72 y.o. female who was admitted 10/30/2017 for operative treatment ofOsteoarthritis of right knee. Patient has severe unremitting pain that affects sleep, daily activities, and work/hobbies. After pre-op clearance the patient was taken to the operating room on 10/30/2017 and underwent  Procedure(s): RIGHT TOTAL KNEE ARTHROPLASTY.    Patient was given perioperative antibiotics:  Anti-infectives (From admission, onward)   Start     Dose/Rate Route Frequency Ordered Stop   10/30/17 0930  ceFAZolin (ANCEF) IVPB 2g/100 mL premix     2 g 200 mL/hr over 30 Minutes Intravenous On call to O.R. 10/30/17 0923 10/30/17 1130   10/30/17 0927  ceFAZolin (ANCEF) 2-4 GM/100ML-% IVPB    Note to Pharmacy:  Lorette Angosenberger, Meredit: cabinet override      10/30/17 0927 10/30/17 1130        Patient was given sequential compression devices, early ambulation, and chemoprophylaxis to prevent DVT.  Patient benefited maximally from hospital stay and there were no complications.    Recent vital signs:  Patient Vitals for the past 24 hrs:  BP Temp Temp src Pulse Resp SpO2  11/01/17 0446 (!) 105/55 98.2 F (36.8 C) Oral 78 14 98 %  10/31/17 2106 (!) 119/52 99.7 F (37.6 C) Oral 88 16 100 %  10/31/17 1348 111/60 (!) 97.5 F (36.4 C) Oral 72 19 99 %     Recent laboratory studies:  Recent Labs    10/31/17 0606 11/01/17 0249  WBC 8.5 6.2  HGB 12.3 11.4*  HCT 38.8 36.4  PLT 205 199  NA 137  --   K 3.9  --   CL 105  --   CO2 26  --   BUN 10  --   CREATININE 0.77  --   GLUCOSE 183*  --   CALCIUM 9.1  --      Discharge Medications:   Allergies as of 11/01/2017      Reactions   Diltiazem Rash   CD formulation likely generalized drug rash/eruption face, trunk, arms    Metoprolol Succinate [metoprolol] Other (See Comments)   Succinate version causes extreme lethargy    Tape Itching, Rash, Other (See Comments)   Reaction: blisters (adhesive tape) Paper tape is ok      Medication  List    STOP taking these medications   meloxicam 15 MG tablet Commonly known as:  MOBIC   metoprolol succinate 25 MG 24 hr tablet Commonly known as:  TOPROL-XL     TAKE these medications   aspirin EC 81 MG tablet Take 1 tablet (81 mg total) by mouth 2 (two) times daily. What changed:  when to take this   metoprolol tartrate 25 MG tablet Commonly known as:  LOPRESSOR Take 1 tablet (25 mg) by mouth as needed for palpitations. What changed:    how much to take  how to take this  when to take this  additional instructions   oxyCODONE-acetaminophen 5-325 MG tablet Commonly known as:  PERCOCET/ROXICET Take 1 tablet by mouth every 4 (four) hours as needed for severe pain.   ranitidine 75 MG tablet Commonly known as:  ZANTAC 75 Take 1 tablet (75 mg total) by mouth  daily. What changed:    when to take this  reasons to take this   tiZANidine 2 MG tablet Commonly known as:  ZANAFLEX Take 1 tablet (2 mg total) by mouth every 6 (six) hours as needed.            Durable Medical Equipment  (From admission, onward)        Start     Ordered   10/30/17 1615  DME Walker rolling  Once    Question:  Patient needs a walker to treat with the following condition  Answer:  Status post right knee replacement   10/30/17 1614   10/30/17 1615  DME 3 n 1  Once     10/30/17 1614      Diagnostic Studies: Dg Chest 2 View  Result Date: 10/20/2017 CLINICAL DATA:  Preoperative evaluation for upcoming knee replacement EXAM: CHEST - 2 VIEW COMPARISON:  04/21/2016 FINDINGS: Cardiac shadow is stable. Large hiatal hernia is again seen. Scoliosis is again seen and stable. The lungs are well aerated bilaterally. No focal infiltrate or sizable effusion is seen. IMPRESSION: Large hiatal hernia.  No acute abnormality noted. Electronically Signed   By: Alcide Clever M.D.   On: 10/20/2017 13:08    Disposition: Discharge disposition: 01-Home or Self Care       Discharge Instructions    Call MD / Call 911   Complete by:  As directed    If you experience chest pain or shortness of breath, CALL 911 and be transported to the hospital emergency room.  If you develope a fever above 101 F, pus (white drainage) or increased drainage or redness at the wound, or calf pain, call your surgeon's office.   Constipation Prevention   Complete by:  As directed    Drink plenty of fluids.  Prune juice may be helpful.  You may use a stool softener, such as Colace (over the counter) 100 mg twice a day.  Use MiraLax (over the counter) for constipation as needed.   Diet - low sodium heart healthy   Complete by:  As directed    Discharge instructions   Complete by:  As directed    INSTRUCTIONS AFTER JOINT REPLACEMENT   Remove items at home which could result in a fall. This includes  throw rugs or furniture in walking pathways ICE to the affected joint every three hours while awake for 30 minutes at a time, for at least the first 3-5 days, and then as needed for pain and swelling.  Continue to use ice for pain and swelling. You may  notice swelling that will progress down to the foot and ankle.  This is normal after surgery.  Elevate your leg when you are not up walking on it.   Continue to use the breathing machine you got in the hospital (incentive spirometer) which will help keep your temperature down.  It is common for your temperature to cycle up and down following surgery, especially at night when you are not up moving around and exerting yourself.  The breathing machine keeps your lungs expanded and your temperature down.   DIET:  As you were doing prior to hospitalization, we recommend a well-balanced diet.  DRESSING / WOUND CARE / SHOWERING  You may shower 3 days after surgery, but keep the wounds dry during showering.  You may use an occlusive plastic wrap (Press'n Seal for example), NO SOAKING/SUBMERGING IN THE BATHTUB.  If the bandage gets wet, change with a clean dry gauze.  If the incision gets wet, pat the wound dry with a clean towel.  ACTIVITY  Increase activity slowly as tolerated, but follow the weight bearing instructions below.   No driving for 6 weeks or until further direction given by your physician.  You cannot drive while taking narcotics.  No lifting or carrying greater than 10 lbs. until further directed by your surgeon. Avoid periods of inactivity such as sitting longer than an hour when not asleep. This helps prevent blood clots.  You may return to work once you are authorized by your doctor.     WEIGHT BEARING   Weight bearing as tolerated with assist device (walker, cane, etc) as directed, use it as long as suggested by your surgeon or therapist, typically at least 4-6 weeks.   EXERCISES  Results after joint replacement surgery are often  greatly improved when you follow the exercise, range of motion and muscle strengthening exercises prescribed by your doctor. Safety measures are also important to protect the joint from further injury. Any time any of these exercises cause you to have increased pain or swelling, decrease what you are doing until you are comfortable again and then slowly increase them. If you have problems or questions, call your caregiver or physical therapist for advice.   Rehabilitation is important following a joint replacement. After just a few days of immobilization, the muscles of the leg can become weakened and shrink (atrophy).  These exercises are designed to build up the tone and strength of the thigh and leg muscles and to improve motion. Often times heat used for twenty to thirty minutes before working out will loosen up your tissues and help with improving the range of motion but do not use heat for the first two weeks following surgery (sometimes heat can increase post-operative swelling).   These exercises can be done on a training (exercise) mat, on the floor, on a table or on a bed. Use whatever works the best and is most comfortable for you.    Use music or television while you are exercising so that the exercises are a pleasant break in your day. This will make your life better with the exercises acting as a break in your routine that you can look forward to.   Perform all exercises about fifteen times, three times per day or as directed.  You should exercise both the operative leg and the other leg as well.   Exercises include:   Quad Sets - Tighten up the muscle on the front of the thigh (Quad) and hold for 5-10 seconds.  Straight Leg Raises - With your knee straight (if you were given a brace, keep it on), lift the leg to 60 degrees, hold for 3 seconds, and slowly lower the leg.  Perform this exercise against resistance later as your leg gets stronger.  Leg Slides: Lying on your back, slowly slide  your foot toward your buttocks, bending your knee up off the floor (only go as far as is comfortable). Then slowly slide your foot back down until your leg is flat on the floor again.  Angel Wings: Lying on your back spread your legs to the side as far apart as you can without causing discomfort.  Hamstring Strength:  Lying on your back, push your heel against the floor with your leg straight by tightening up the muscles of your buttocks.  Repeat, but this time bend your knee to a comfortable angle, and push your heel against the floor.  You may put a pillow under the heel to make it more comfortable if necessary.   A rehabilitation program following joint replacement surgery can speed recovery and prevent re-injury in the future due to weakened muscles. Contact your doctor or a physical therapist for more information on knee rehabilitation.    CONSTIPATION  Constipation is defined medically as fewer than three stools per week and severe constipation as less than one stool per week.  Even if you have a regular bowel pattern at home, your normal regimen is likely to be disrupted due to multiple reasons following surgery.  Combination of anesthesia, postoperative narcotics, change in appetite and fluid intake all can affect your bowels.   YOU MUST use at least one of the following options; they are listed in order of increasing strength to get the job done.  They are all available over the counter, and you may need to use some, POSSIBLY even all of these options:    Drink plenty of fluids (prune juice may be helpful) and high fiber foods Colace 100 mg by mouth twice a day  Senokot for constipation as directed and as needed Dulcolax (bisacodyl), take with full glass of water  Miralax (polyethylene glycol) once or twice a day as needed.  If you have tried all these things and are unable to have a bowel movement in the first 3-4 days after surgery call either your surgeon or your primary doctor.    If  you experience loose stools or diarrhea, hold the medications until you stool forms back up.  If your symptoms do not get better within 1 week or if they get worse, check with your doctor.  If you experience "the worst abdominal pain ever" or develop nausea or vomiting, please contact the office immediately for further recommendations for treatment.   ITCHING:  If you experience itching with your medications, try taking only a single pain pill, or even half a pain pill at a time.  You can also use Benadryl over the counter for itching or also to help with sleep.   TED HOSE STOCKINGS:  Use stockings on both legs until for at least 2 weeks or as directed by physician office. They may be removed at night for sleeping.  MEDICATIONS:  See your medication summary on the "After Visit Summary" that nursing will review with you.  You may have some home medications which will be placed on hold until you complete the course of blood thinner medication.  It is important for you to complete the blood thinner medication as prescribed.  PRECAUTIONS:  If you experience chest pain or shortness of breath - call 911 immediately for transfer to the hospital emergency department.   If you develop a fever greater that 101 F, purulent drainage from wound, increased redness or drainage from wound, foul odor from the wound/dressing, or calf pain - CONTACT YOUR SURGEON.                                                   FOLLOW-UP APPOINTMENTS:  If you do not already have a post-op appointment, please call the office for an appointment to be seen by your surgeon.  Guidelines for how soon to be seen are listed in your "After Visit Summary", but are typically between 1-4 weeks after surgery.  OTHER INSTRUCTIONS:   Knee Replacement:  Do not place pillow under knee, focus on keeping the knee straight while resting. CPM instructions: 0-90 degrees, 2 hours in the morning, 2 hours in the afternoon, and 2 hours in the evening. Place  foam block, curve side up under heel at all times except when in CPM or when walking.  DO NOT modify, tear, cut, or change the foam block in any way.  MAKE SURE YOU:  Understand these instructions.  Get help right away if you are not doing well or get worse.    Thank you for letting us be a part of your medical care team.  It is a privilege we respect greatly.  We hope these instructions will help you stay on track for a fast and full recovery!   Increase activity slowly as tolerated   Complete by:  As directed       Follow-up Information    Gean Birchwood, MD In 2 weeks.   Specialty:  Orthopedic Surgery Contact information: [address] [place] [address] [phone]            Signed: Drema Halon 11/01/2017, 9:17 AM

## 2017-11-01 NOTE — Progress Notes (Signed)
Physical Therapy Treatment Patient Details Name: Kristy Mejia MRN: [id number] DOB: 10-13-1945 Today's Date: 11/01/2017    History of Present Illness Pt is a 72 y/o female s/p R TKA. PMH including but not limited to HLD and scoliosis.    PT Comments    This session focused on HEP and pt tolerated therex well. Pt required min A at times due to difficulty with R quad activation. Pt with improving ROM. Current plan remains appropriate.    Follow Up Recommendations  Home health PT;Supervision/Assistance - 24 hour     Equipment Recommendations  None recommended by PT    Recommendations for Other Services       Precautions / Restrictions Precautions Precautions: Fall;Knee Precaution Comments: Reviewed positioning of knee following TKA sx with pt. Pt expressed understanding. Restrictions Weight Bearing Restrictions: Yes RLE Weight Bearing: Weight bearing as tolerated    Mobility  Bed Mobility Overal bed mobility: Modified Independent;Needs Assistance Bed Mobility: Sit to Supine     Supine to sit: Modified independent (Device/Increase time) Sit to supine: Min assist   General bed mobility comments: assist to bring R LE into bed  Transfers Overall transfer level: Needs assistance Equipment used: Rolling walker (2 wheeled) Transfers: Sit to/from Stand Sit to Stand: Supervision         General transfer comment: increased time, good technique, supervision for safety from EOB and BSC  Ambulation/Gait Ambulation/Gait assistance: Min guard;Supervision Gait Distance (Feet): 150 Feet Assistive device: Rolling walker (2 wheeled) Gait Pattern/deviations: Step-to pattern;Decreased step length - left;Decreased stride length;Decreased weight shift to right;Step-through pattern;Decreased stance time - right;Trunk flexed Gait velocity: decreased   General Gait Details: cues for sequencing, R heel strike/toe off, and R knee extension in stanc phase; pt with improving step through  pattern and weight bearing with increased distance   Stairs             Wheelchair Mobility    Modified Rankin (Stroke Patients Only)       Balance Overall balance assessment: Needs assistance Sitting-balance support: Feet supported Sitting balance-Leahy Scale: Good     Standing balance support: During functional activity Standing balance-Leahy Scale: Fair Standing balance comment: pt able to static stand without UE support                            Cognition Arousal/Alertness: Awake/alert Behavior During Therapy: WFL for tasks assessed/performed Overall Cognitive Status: Within Functional Limits for tasks assessed                                        Exercises Total Joint Exercises Ankle Circles/Pumps: AROM;Both Quad Sets: AROM;Both Short Arc Quad: AAROM;Right Heel Slides: AAROM;Right Hip ABduction/ADduction: AROM;Right Straight Leg Raises: AAROM;Right Long Arc Quad: AAROM;Right;Seated Knee Flexion: AROM;AAROM;Right;10 reps;Seated(10 sec holds) Goniometric ROM: 90 degrees flexion with AAROM    General Comments        Pertinent Vitals/Pain Pain Assessment: Faces Faces Pain Scale: Hurts little more Pain Location: R knee Pain Descriptors / Indicators: Sore;Guarding Pain Intervention(s): Limited activity within patient's tolerance;Monitored during session;Premedicated before session;Repositioned    Home Living                      Prior Function            PT Goals (current goals can now be found in the care  plan section) Acute Rehab PT Goals PT Goal Formulation: With patient Time For Goal Achievement: 11/14/17 Potential to Achieve Goals: Good Progress towards PT goals: Progressing toward goals    Frequency    7X/week      PT Plan Current plan remains appropriate    Co-evaluation              AM-PAC PT "6 Clicks" Daily Activity  Outcome Measure  Difficulty turning over in bed (including  adjusting bedclothes, sheets and blankets)?: A Little Difficulty moving from lying on back to sitting on the side of the bed? : A Little Difficulty sitting down on and standing up from a chair with arms (e.g., wheelchair, bedside commode, etc,.)?: A Lot Help needed moving to and from a bed to chair (including a wheelchair)?: A Little Help needed walking in hospital room?: A Little Help needed climbing 3-5 steps with a railing? : A Little 6 Click Score: 17    End of Session Equipment Utilized During Treatment: Gait belt Activity Tolerance: Patient tolerated treatment well Patient left: with call bell/phone within reach;in bed Nurse Communication: Mobility status PT Visit Diagnosis: Other abnormalities of gait and mobility (R26.89);Pain Pain - Right/Left: Right Pain - part of body: Knee     Time: [id number] PT Time Calculation (min) (ACUTE ONLY): 26 min  Charges:   $Therapeutic Exercise: 23-37 mins                    G Codes:       Erline LevineKellyn Justin Meisenheimer, PTA Pager: [phone]     Carolynne EdouardKellyn R Sarahy Creedon 11/01/2017, 11:52 AM

## 2017-11-01 NOTE — Progress Notes (Signed)
Subjective: 2 Days Post-Op Procedure(s) (LRB): RIGHT TOTAL KNEE ARTHROPLASTY (Right)  Activity level:  wbat Diet tolerance:  ok Voiding:  ok Patient reports pain as mild.    Objective: Vital signs in last 24 hours: Temp:  [97.5 F (36.4 C)-99.7 F (37.6 C)] 98.2 F (36.8 C) (06/16 0446) Pulse Rate:  [72-88] 78 (06/16 0446) Resp:  [14-19] 14 (06/16 0446) BP: (105-119)/(52-60) 105/55 (06/16 0446) SpO2:  [98 %-100 %] 98 % (06/16 0446)  Labs: Recent Labs    10/31/17 0606 11/01/17 0249  HGB 12.3 11.4*   Recent Labs    10/31/17 0606 11/01/17 0249  WBC 8.5 6.2  RBC 4.13 3.88  HCT 38.8 36.4  PLT 205 199   Recent Labs    10/31/17 0606  NA 137  K 3.9  CL 105  CO2 26  BUN 10  CREATININE 0.77  GLUCOSE 183*  CALCIUM 9.1   No results for input(s): LABPT, INR in the last 72 hours.  Physical Exam:  Neurologically intact ABD soft Neurovascular intact Sensation intact distally Intact pulses distally Dorsiflexion/Plantar flexion intact Incision: dressing C/D/I and no drainage No cellulitis present Compartment soft  Assessment/Plan:  2 Days Post-Op Procedure(s) (LRB): RIGHT TOTAL KNEE ARTHROPLASTY (Right) Advance diet Up with therapy Discharge home with home health today. Continnue on ASA for DVT prevention. Follow up in office with Dr. Turner Danielsowan 2 weeks post op. Anticipated LOS equal to or greater than 2 midnights due to - Age 70 and older with one or more of the following:  - Obesity  - Expected need for hospital services (PT, OT, Nursing) required for safe  discharge  - Anticipated need for postoperative skilled nursing care or inpatient rehab  - Active co-morbidities: None OR   - Unanticipated findings during/Post Surgery: Slow post-op progression: GI, pain control, mobility  - Patient is a high risk of re-admission due to: None   Kristy Mejia, Kristy OrganNDREW PAUL 11/01/2017, 9:15 AM

## 2017-11-01 NOTE — Progress Notes (Signed)
Physical Therapy Treatment Patient Details Name: Kristy Mejia MRN: [id number] DOB: [birth year] Today's Date: 11/01/2017    History of Present Illness Pt is a 72 y/o female s/p R TKA. PMH including but not limited to HLD and scoliosis.    PT Comments    Patient is progressing well toward PT goals and tolerated gait and stair training well. Current plan remains appropriate.    Follow Up Recommendations  Home health PT;Supervision/Assistance - 24 hour     Equipment Recommendations  None recommended by PT    Recommendations for Other Services       Precautions / Restrictions Precautions Precautions: Fall;Knee Precaution Comments: Reviewed positioning of knee following TKA sx with pt. Pt expressed understanding. Restrictions Weight Bearing Restrictions: Yes RLE Weight Bearing: Weight bearing as tolerated    Mobility  Bed Mobility Overal bed mobility: Modified Independent             General bed mobility comments: increased time and effort  Transfers Overall transfer level: Needs assistance Equipment used: Rolling walker (2 wheeled) Transfers: Sit to/from Stand Sit to Stand: Supervision         General transfer comment: increased time, good technique, supervision for safety from EOB and BSC  Ambulation/Gait Ambulation/Gait assistance: Min guard;Supervision Gait Distance (Feet): 150 Feet Assistive device: Rolling walker (2 wheeled) Gait Pattern/deviations: Step-to pattern;Decreased step length - left;Decreased stride length;Decreased weight shift to right;Step-through pattern;Decreased stance time - right;Trunk flexed Gait velocity: decreased   General Gait Details: cues for sequencing, R heel strike/toe off, and R knee extension in stanc phase; pt with improving step through pattern and weight bearing with increased distance   Stairs             Wheelchair Mobility    Modified Rankin (Stroke Patients Only)       Balance Overall balance  assessment: Needs assistance Sitting-balance support: Feet supported Sitting balance-Leahy Scale: Good     Standing balance support: During functional activity Standing balance-Leahy Scale: Fair Standing balance comment: pt able to static stand without UE support                            Cognition Arousal/Alertness: Awake/alert Behavior During Therapy: WFL for tasks assessed/performed Overall Cognitive Status: Within Functional Limits for tasks assessed                                        Exercises      General Comments        Pertinent Vitals/Pain Pain Assessment: Faces Faces Pain Scale: Hurts little more Pain Location: R knee Pain Descriptors / Indicators: Sore;Guarding Pain Intervention(s): Limited activity within patient's tolerance;Monitored during session;Premedicated before session;Repositioned    Home Living                      Prior Function            PT Goals (current goals can now be found in the care plan section) Acute Rehab PT Goals PT Goal Formulation: With patient Time For Goal Achievement: 11/14/17 Potential to Achieve Goals: Good Progress towards PT goals: Progressing toward goals    Frequency    7X/week      PT Plan Current plan remains appropriate    Co-evaluation              AM-PAC PT "  6 Clicks" Daily Activity  Outcome Measure  Difficulty turning over in bed (including adjusting bedclothes, sheets and blankets)?: A Little Difficulty moving from lying on back to sitting on the side of the bed? : A Little Difficulty sitting down on and standing up from a chair with arms (e.g., wheelchair, bedside commode, etc,.)?: A Lot Help needed moving to and from a bed to chair (including a wheelchair)?: A Little Help needed walking in hospital room?: A Little Help needed climbing 3-5 steps with a railing? : A Little 6 Click Score: 17    End of Session Equipment Utilized During Treatment: Gait  belt Activity Tolerance: Patient tolerated treatment well Patient left: in chair;with call bell/phone within reach Nurse Communication: Mobility status PT Visit Diagnosis: Other abnormalities of gait and mobility (R26.89);Pain Pain - Right/Left: Right Pain - part of body: Knee     Time: 0819-0850 PT Time Calculation (min) (ACUTE ONLY): 31 min  Charges:  $Gait Training: 23-37 mins                    G Codes:       Erline LevineKellyn Tajae Rybicki, PTA Pager: [phone]     Carolynne EdouardKellyn R Laddie Naeem 11/01/2017, 8:56 AM

## 2017-11-01 NOTE — Care Management Note (Addendum)
Case Management Note  Patient Details  Name: Kristy Mejia MRN: [id number] Date of Birth: June 29, 1945  Subjective/Objective:   S/p R TKA                 Action/Plan: [facility] to pt [facility] (pre[facility] arranged from surgeon's office). States she has RW and 3n1 at home. Unit RN will add [facility] to dc instructions. [facility] aware of dc home today.  Expected Discharge Date:  11/01/17               Expected Discharge Plan:  Home w Home Health Services  In-House Referral:  NA  Discharge planning Services  CM Consult  Post Acute Care Choice:  Home Health Choice offered to:  Patient  DME Arranged:  N/A DME Agency:  NA  HH Arranged:  PT HH Agency:  [facility]  Status of Service:  Completed, signed off  If discussed at Long Length of Stay Meetings, dates discussed:    Additional Comments:  Elliot CousinShavis, Jazell Rosenau Ellen, RN 11/01/2017, 2:13 PM

## 2017-11-01 NOTE — Progress Notes (Signed)
Kristy Mejia to be D/C'd Home per MD order.  Discussed prescriptions and follow up appointments with the patient. Prescriptions given to patient, medication list explained in detail. Pt verbalized understanding.  Allergies as of 11/01/2017      Reactions   Diltiazem Rash   CD formulation likely generalized drug rash/eruption face, trunk, arms    Metoprolol Succinate [metoprolol] Other (See Comments)   Succinate version causes extreme lethargy    Tape Itching, Rash, Other (See Comments)   Reaction: blisters (adhesive tape) Paper tape is ok      Medication List    STOP taking these medications   meloxicam 15 MG tablet Commonly known as:  MOBIC   metoprolol succinate 25 MG 24 hr tablet Commonly known as:  TOPROL-XL     TAKE these medications   aspirin EC 81 MG tablet Take 1 tablet (81 mg total) by mouth 2 (two) times daily. What changed:  when to take this   metoprolol tartrate 25 MG tablet Commonly known as:  LOPRESSOR Take 1 tablet (25 mg) by mouth as needed for palpitations. What changed:    how much to take  how to take this  when to take this  additional instructions   oxyCODONE-acetaminophen 5-325 MG tablet Commonly known as:  PERCOCET/ROXICET Take 1 tablet by mouth every 4 (four) hours as needed for severe pain.   ranitidine 75 MG tablet Commonly known as:  ZANTAC 75 Take 1 tablet (75 mg total) by mouth daily. What changed:    when to take this  reasons to take this   tiZANidine 2 MG tablet Commonly known as:  ZANAFLEX Take 1 tablet (2 mg total) by mouth every 6 (six) hours as needed.            Durable Medical Equipment  (From admission, onward)        Start     Ordered   10/30/17 1615  DME Walker rolling  Once    Question:  Patient needs a walker to treat with the following condition  Answer:  Status post right knee replacement   10/30/17 1614   10/30/17 1615  DME 3 n 1  Once     10/30/17 1614      Vitals:   10/31/17 2106 11/01/17  0446  BP: (!) 119/52 (!) 105/55  Pulse: 88 78  Resp: 16 14  Temp: 99.7 F (37.6 C) 98.2 F (36.8 C)  SpO2: 100% 98%    Skin clean, dry and intact without evidence of skin break down, no evidence of skin tears noted. Aquacel dressing in place on right knee, clean, dry, and intact. Thigh high TED hose were placed prior to discharge. IV catheter discontinued intact. Site without signs and symptoms of complications. Dressing and pressure applied. Pt denies pain at this time. No complaints noted.  An After Visit Summary and prescriptions were printed and  given to the patient. Patient escorted via WC, and D/C home via private auto.  GrenadaBrittany Jayron Maqueda RN

## 2017-11-02 ENCOUNTER — Encounter (HOSPITAL_COMMUNITY): Payer: Self-pay | Admitting: Orthopedic Surgery

## 2017-11-03 NOTE — Addendum Note (Signed)
Addendum  created 11/03/17 1605 by Kipp BroodJoslin, Alben Jepsen, MD   Intraprocedure Blocks edited, Sign clinical note

## 2017-11-04 ENCOUNTER — Telehealth: Payer: Self-pay | Admitting: Cardiovascular Disease

## 2017-11-04 NOTE — Telephone Encounter (Signed)
Pt c/o medication issue:  1. Name of Medication: metoprolol 25 mg   2. How are you currently taking this medication (dosage and times per day)? 25 mg, only takes it when she has an episode   3. Are you having a reaction (difficulty breathing--STAT)? No   4. What is your medication issue? She recently had surgery and is taking a muscle relaxer and is wanting to know If she can take her metoprolol  She was having an episode and wasn't sure if she could take the metoprolol  Please advise

## 2017-11-04 NOTE — Telephone Encounter (Signed)
I called and spoke with the patient. She states she just had knee replacement done and was prescribed a muscle relaxer. She wanted to know if she could take her PRN metoprolol with this as she had an episode of tachycardia this morning. I advised her she may continue to use her PRN metoprolol.  She voices understanding.

## 2018-01-04 ENCOUNTER — Encounter: Payer: Self-pay | Admitting: Family Medicine

## 2018-01-04 ENCOUNTER — Ambulatory Visit: Payer: Medicare Other | Admitting: Family Medicine

## 2018-01-04 VITALS — BP 134/82 | HR 65 | Temp 98.3°F | Resp 16 | Wt 173.1 lb

## 2018-01-04 DIAGNOSIS — R300 Dysuria: Secondary | ICD-10-CM | POA: Diagnosis not present

## 2018-01-04 DIAGNOSIS — M546 Pain in thoracic spine: Secondary | ICD-10-CM

## 2018-01-04 DIAGNOSIS — K449 Diaphragmatic hernia without obstruction or gangrene: Secondary | ICD-10-CM | POA: Diagnosis not present

## 2018-01-04 DIAGNOSIS — R3129 Other microscopic hematuria: Secondary | ICD-10-CM | POA: Insufficient documentation

## 2018-01-04 DIAGNOSIS — R110 Nausea: Secondary | ICD-10-CM | POA: Diagnosis not present

## 2018-01-04 DIAGNOSIS — K219 Gastro-esophageal reflux disease without esophagitis: Secondary | ICD-10-CM | POA: Insufficient documentation

## 2018-01-04 DIAGNOSIS — R319 Hematuria, unspecified: Secondary | ICD-10-CM

## 2018-01-04 DIAGNOSIS — N390 Urinary tract infection, site not specified: Secondary | ICD-10-CM | POA: Diagnosis not present

## 2018-01-04 DIAGNOSIS — G8929 Other chronic pain: Secondary | ICD-10-CM

## 2018-01-04 LAB — POCT URINALYSIS DIPSTICK
BILIRUBIN UA: NEGATIVE
Glucose, UA: NEGATIVE
KETONES UA: 5
Nitrite, UA: NEGATIVE
Protein, UA: POSITIVE — AB
Spec Grav, UA: <=1.005 — AB (ref 1.010–1.025)
UROBILINOGEN UA: 0.2 U/dL
pH, UA: 5 (ref 5.0–8.0)

## 2018-01-04 MED ORDER — SULFAMETHOXAZOLE-TRIMETHOPRIM 800-160 MG PO TABS: 1.0000 | ORAL_TABLET | Freq: Two times a day (BID) | ORAL | 0 refills | Status: AC

## 2018-01-04 NOTE — Progress Notes (Signed)
Subjective:    Patient ID: Kristy Mejia, female    DOB: 04-05-1946, 72 y.o.   MRN: [id number]  HPI   Patient presents to clinic for follow-up after being seen on 01/01/2018 at [facility] clinic.  Patient went to urgent care due to left upper back pain/ABD pain.  Patient states it felt as if she either could pass gas or have bowel movement, she would feel better.  EKG and chest x-ray were done both unremarkable other than a large hiatal hernia seen on chest x-ray.  Urinalysis performed which showed microscopic hematuria. Lab work was unremarkable.  Patient was prescribed omeprazole 20 mg daily and also given a referral to GI for further evaluation of hiatal hernia.  Patient states she read side effect profile of omeprazole and she was not to take this medication.  She instead increased her Zantac from 75 mg a day to 150 mg a day and this seemed to help abdominal symptoms.  Today her symptoms are reported to be much better.  She took some Tylenol for her upper back pain and has been eating small amounts of food.   Labs done at Urgent Care:  Results for orders placed or performed in visit on 01/01/18  CBC w/auto Differential (5 Part)  Result Value Ref Range  WBC (White Blood Cell Count) 6.5 4.1 - 10.2 10^3/uL  RBC (Red Blood Cell Count) 4.59 4.04 - 5.48 10^6/uL  Hemoglobin 14.1 12.0 - 15.0 gm/dL  Hematocrit 43.4 35.0 - 47.0 %  MCV (Mean Corpuscular Volume) 94.6 80.0 - 100.0 fl  MCH (Mean Corpuscular Hemoglobin) 30.7 27.0 - 31.2 pg  MCHC (Mean Corpuscular Hemoglobin Concentration) 32.5 32.0 - 36.0 gm/dL  Platelet Count 248 150 - 450 10^3/uL  RDW-CV (Red Cell Distribution Width) 13.7 11.6 - 14.8 %  MPV (Mean Platelet Volume) 11.5 9.4 - 12.4 fl  Neutrophils 4.10 1.50 - 7.80 10^3/uL  Lymphocytes 1.64 1.00 - 3.60 10^3/uL  Monocytes 0.60 0.00 - 1.50 10^3/uL  Eosinophils 0.04 0.00 - 0.55 10^3/uL  Basophils 0.06 0.00 - 0.09 10^3/uL  Neutrophil % 63.6 32.0 - 70.0 %  Lymphocyte % 25.4 10.0 - 50.0  %  Monocyte % 9.3 4.0 - 13.0 %  Eosinophil % 0.6 (L) 1.0 - 5.0 %  Basophil% 0.9 0.0 - 2.0 %  Immature Granulocyte % 0.2 <=0.7 %  Immature Granulocyte Count 0.01 <=0.06 10^3/L  Comprehensive Metabolic Panel (CMP)  Result Value Ref Range  Glucose 110 70 - 110 mg/dL  Sodium 142 136 - 145 mmol/L  Potassium 4.4 3.6 - 5.1 mmol/L  Chloride 105 97 - 109 mmol/L  Carbon Dioxide (CO2) 31.5 22.0 - 32.0 mmol/L  Urea Nitrogen (BUN) 14 7 - 25 mg/dL  Creatinine 0.8 0.6 - 1.1 mg/dL  Glomerular Filtration Rate (eGFR), MDRD Estimate 86 >60 mL/min/1.73sq m  Calcium 10.1 8.7 - 10.3 mg/dL  AST 15 8 - 39 U/L  ALT 11 5 - 38 U/L  Alk Phos (alkaline Phosphatase) 99 34 - 104 U/L  Albumin 4.3 3.5 - 4.8 g/dL  Bilirubin, Total 0.5 0.3 - 1.2 mg/dL  Protein, Total 7.0 6.1 - 7.9 g/dL  A/G Ratio 1.6 1.0 - 5.0 gm/dL  Lipase  Result Value Ref Range  Lipase 13 11 - 82 U/L  Urinalysis w/Microscopic  Result Value Ref Range  Color Yellow Yellow  Clarity Clear Clear  Specific Gravity 1.010 1.000 - 1.030  pH, Urine 7.0 5.0 - 8.0  Protein, Urinalysis Negative Negative, Trace mg/dL  Glucose, Urinalysis Negative Negative  mg/dL  Ketones, Urinalysis Negative Negative mg/dL  Blood, Urinalysis Large (!) Negative  Nitrite, Urinalysis Negative Negative  Leukocyte Esterase, Urinalysis Negative Negative  White Blood Cells, Urinalysis None Seen None Seen, 0-3 /hpf  Red Blood Cells, Urinalysis 10-50 (!) None Seen, 0-3 /hpf  Bacteria, Urinalysis Few (!) None Seen /hpf  Squamous Epithelial Cells, Urinalysis Few Rare, Few, None Seen /hpf    EKG done at urgent care 01/01/18:  Rate: 69 Rhythm: normal sinus rhythm QRS Axis: normal PR Interval: 172 QRS Duration: 86 QTc: 439 Voltages: normal Conduction Disturbances: none Other Abnormalities: none  Interpretation: NSR   Patient Active Problem List   Diagnosis Date Noted  . Primary osteoarthritis of right knee 10/30/2017  . Osteoarthritis of right knee 10/27/2017  .  Knee pain, bilateral 09/18/2017  . Elevated blood pressure reading 06/24/2017  . Vitamin D deficiency 04/21/2017  . Hyperlipidemia 04/21/2017  . Paroxysmal supraventricular tachycardia ([place]) 04/24/2015  . Prediabetes 04/24/2015  . Status post revision of total hip 04/17/2014  . 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 Family history of breast cancer 06/22/2013  . Diffuse cystic mastopathy 06/20/2013  . Palpitations 09/25/2011  . Dyspnea 09/25/2011   Social History   Tobacco Use  . Smoking status: Never Smoker  . Smokeless tobacco: Never Used  Substance Use Topics  . Alcohol use: No   Review of Systems  Constitutional: Negative for chills, fatigue and fever.  HENT: Negative for congestion, ear pain, sinus pain and sore throat.   Eyes: Negative.   Respiratory: Negative for cough, shortness of breath and wheezing.   Cardiovascular: Negative for chest pain, palpitations and leg swelling.  Gastrointestinal: Negative for abdominal pain, diarrhea, nausea and vomiting. "fullness feeling" off and on - better with increased zantac dose Genitourinary: Negative for dysuria, frequency and urgency. +blood in urine seen at urgent care on 01/01/18 Musculoskeletal: Negative for arthralgias and myalgias.  Skin: Negative for color change, pallor and rash.  Neurological: Negative for syncope, light-headedness and headaches.  Psychiatric/Behavioral: The patient is not nervous/anxious.      Objective:   Physical Exam  Constitutional: She appears well-developed and well-nourished. No distress.  Head: Normocephalic and atraumatic.  Eyes: EOM are normal. No scleral icterus.  Neck: Normal range of motion. Neck supple. No tracheal deviation present.  Cardiovascular: Normal rate, regular rhythm and normal heart sounds.  Pulmonary/Chest: Effort normal and breath sounds normal. No respiratory distress. Musculoskeletal: No back pain at this time, no back tenderness.  Abdominal: Soft. Bowel  sounds are normal. There is no tenderness.  Neurological: She is alert and oriented to person, place, and time. Gait normal  Skin: Skin is warm and dry. No pallor.  Psychiatric: She has a normal mood and affect. Her behavior is normal. Thought content normal.   Nursing note and vitals reviewed  Vitals:   01/04/18 0815  BP: 134/82  Pulse: 65  Resp: 16  Temp: 98.3 F (36.8 C)  SpO2: 97%      Assessment & Plan:   Hiatal hernia/nausea -patient will continue Zantac at 150 mg daily dose.  She has GI appointment coming up on 28 August and she will keep this.  Patient advised to eat bland/soft foods and drink clear liquids over the next day or so, then can slowly advance diet as tolerated.  UTI/microscopic hematuria -urinalysis dipstick done in clinic today shows some microscopic blood and also leukocytes.  Due to continued microscopic blood on dipstick and also leukocytes seen in clinic today, we will  treat for UTI with 3-day course of Bactrim twice daily.  Left-sided thoracic back pain- patient states this has pretty much resolved on own.  Advised she can use Tylenol as needed for pain.  Keep specialist appointment with GI on August 28 and also keep follow-up appointment with Dr. Marigene Ehlers as scheduled for September 3.

## 2018-01-04 NOTE — Patient Instructions (Signed)
Great to meet you!  Increase zantac to 150mg  (2 tablets) a day  Keep GI appt on 8/28 as scheduled

## 2018-01-05 ENCOUNTER — Encounter: Payer: Self-pay | Admitting: Cardiovascular Disease

## 2018-01-05 ENCOUNTER — Ambulatory Visit: Payer: Medicare Other | Admitting: Cardiovascular Disease

## 2018-01-05 VITALS — BP 138/80 | HR 72 | Ht 62.0 in | Wt 171.5 lb

## 2018-01-05 DIAGNOSIS — R0789 Other chest pain: Secondary | ICD-10-CM | POA: Diagnosis not present

## 2018-01-05 DIAGNOSIS — I471 Supraventricular tachycardia: Secondary | ICD-10-CM | POA: Diagnosis not present

## 2018-01-05 NOTE — Patient Instructions (Signed)
Medication Instructions: Your physician recommends that you continue on your current medications as directed. Please refer to the Current Medication list given to you today.  If you need a refill on your cardiac medications before your next appointment, please call your pharmacy.   Follow-Up: Your physician wants you to follow-up in 12 months with Dr. Arida. You will receive a reminder letter in the mail two months in advance. If you don't receive a letter, please call our office at [phone] to schedule this follow-up appointment.  Thank you for choosing [facility]!    

## 2018-01-05 NOTE — Progress Notes (Signed)
Cardiology Office Note   Date:  01/05/2018   ID:  Kristy Mejia, DOB 25-Dec-1945, MRN [id number]  PCP:  McLean-Scocuzza, Pasty Spillersracy N, MD  Cardiologist:   Lorine BearsMuhammad Kennisha Qin, MD   Chief Complaint  Patient presents with  . other    6 month f/u c/o episode irregular HR and pt would like to discuss chest x-ray completed @ [facility]. Meds reviewed verbally with pt.      History of Present Illness: Kristy Mejia is a 72 y.o. female who presents for follow-up visit regarding  paroxysmal supraventricular tachycardia and atypical chest pain She had an episode of SVT which required emergency room visit in December 2016 and was terminated with adenosine. Troponin was mildly elevated.  He underwent a nuclear stress test in 04/2015 which showed no evidence of ischemia with normal ejection fraction.   She had chest pain last year that was felt to be GI in nature related to starting Mobic.  Symptoms resolved after stopping the medication and taking short-term Zantac. She did have a rash with diltiazem which was discontinued.  She was tried on Toprol but that caused excessive fatigue.  Due to that, she has been using metoprolol tartrate as needed for episodes of tachycardia.  She had 2 episodes since her last visit.  One was in June and the second was in August.  Both responded to metoprolol.  She has been doing well otherwise with no chest pain or shortness of breath.  Past Medical History:  Diagnosis Date  . Arthritis   . Atypical chest pain    a. 04/2015 Myoview: EF 78%, breast attenuation, no ischemia-->Low risk.  . Back pain    scoliosis  . Chicken pox   . Dysrhythmia    hx palpitations  . GERD (gastroesophageal reflux disease)   . History of hiatal hernia    noted on cxr  . Hyperlipidemia   . Joint pain   . Obesity, unspecified   . Paroxysmal SVT (supraventricular tachycardia) (HCC)    a. 2013 Holter: PACs/PVCs; b. 10/2011 Ehco: EF nl, no rwma, mild LVH, mild TR, PASP 34mmHg; c 04/2015 SVT in  ED->resolved with adenosine; c.   . Pneumonia 2010   hx of  . Scoliosis    multiple areas of back follows with Dr. Dyke MaesKen Brown chiropractor     Past Surgical History:  Procedure Laterality Date  . ABDOMINAL HYSTERECTOMY     1990 ovaries intact. had 2/2 fibroids last pap 2004 neg.  ? if cervix present   . BACK SURGERY  1960's   d/t scoliosis-1964/65?  Marland Kitchen. BACK SURGERY     x 3   . BREAST BIOPSY Left 2008   CORE W/CLIP - NEG  . BREAST BIOPSY     2000   . BREAST EXCISIONAL BIOPSY Right 20 + yrs ago  . BREAST SURGERY  1990   excision breast mass  . COLONOSCOPY  2007   Dr. Mechele CollinElliott  . COLONOSCOPY WITH ESOPHAGOGASTRODUODENOSCOPY (EGD) AND ESOPHAGEAL DILATION (ED)    . EYE SURGERY  2007   cataracts  . FOOT SURGERY  1960's  . JOINT REPLACEMENT     total hip left with metal hardward 2016 Dr. Alphonsa Ginowan [facility]   . OTHER SURGICAL HISTORY  2004   Bilateral cataracts  . PARTIAL HYSTERECTOMY  1990  . REVISION TOTAL HIP ARTHROPLASTY Left 04/17/2014   DR Turner DanielsOWAN  . TOTAL HIP ARTHROPLASTY  2006  . TOTAL HIP REVISION Left 04/17/2014   Procedure: LEFT TOTAL  HIP REVISION;  Surgeon: Nestor Lewandowsky, MD;  Location: [facility] OR;  Service: Orthopedics;  Laterality: Left;  . TOTAL KNEE ARTHROPLASTY Right 10/30/2017   Procedure: RIGHT TOTAL KNEE ARTHROPLASTY;  Surgeon: Gean Birchwood, MD;  Location: [facility] OR;  Service: Orthopedics;  Laterality: Right;     Current Outpatient Medications  Medication Sig Dispense Refill  . aspirin EC 81 MG tablet Take 1 tablet (81 mg total) by mouth 2 (two) times daily. (Patient taking differently: Take 81 mg by mouth daily. ) 60 tablet 0  . metoprolol tartrate (LOPRESSOR) 25 MG tablet Take 1 tablet (25 mg) by mouth as needed for palpitations. (Patient taking differently: Take 25 mg by mouth See admin instructions. Take 25 mg as needed every 15 minutes until tachycardia is resolved (max 75 mg per episode)) 180 tablet 0  . ranitidine (ZANTAC 75) 75 MG tablet Take 1 tablet (75 mg  total) by mouth daily. (Patient taking differently: Take 75 mg by mouth daily as needed for heartburn. ) 30 tablet 6  . sulfamethoxazole-trimethoprim (BACTRIM DS,SEPTRA DS) 800-160 MG tablet Take 1 tablet by mouth 2 (two) times daily for 3 days. 6 tablet 0   No current facility-administered medications for this visit.     Allergies:   Diltiazem; Metoprolol succinate [metoprolol]; and Tape    Social History:  The patient  reports that she has never smoked. She has never used smokeless tobacco. She reports that she does not drink alcohol or use drugs.   Family History:  The patient's family history includes Aneurysm in her brother; Arthritis in her mother; Breast cancer (age of onset: 78) in her mother; Cancer in her mother; Dementia in her mother; Diabetes in her father; Early death in her brother and father; Heart attack in her father; Heart disease in her father; Hypertension in her father.    ROS:  Please see the history of present illness.   Otherwise, review of systems are positive for none.   All other systems are reviewed and negative.    PHYSICAL EXAM: VS:  BP 138/80 (BP Location: Right Arm, Patient Position: Sitting, Cuff Size: Normal)   Pulse 72   Ht 5\' 2"  (1.575 m)   Wt 171 lb 8 oz (77.8 kg)   BMI 31.37 kg/m  , BMI Body mass index is 31.37 kg/m. GEN: Well nourished, well developed, in no acute distress  HEENT: normal  Neck: no JVD, carotid bruits, or masses Cardiac: RRR; no murmurs, rubs, or gallops,no edema  Respiratory:  clear to auscultation bilaterally, normal work of breathing GI: soft, nontender, nondistended, + BS MS: no deformity or atrophy  Skin: warm and dry, no rash Neuro:  Strength and sensation are intact Psych: euthymic mood, full affect   EKG:  EKG is ordered today. The ekg ordered today demonstrates normal sinus rhythm with no significant ST or T wave changes.   Recent Labs: 04/21/2017: ALT 17; TSH 2.58 10/31/2017: BUN 10; Creatinine, Ser 0.77;  Potassium 3.9; Sodium 137 11/01/2017: Hemoglobin 11.4; Platelets 199    Lipid Panel    Component Value Date/Time   CHOL 263 (H) 04/21/2017 0841   TRIG 95.0 04/21/2017 0841   HDL 75.00 04/21/2017 0841   CHOLHDL 4 04/21/2017 0841   VLDL 19.0 04/21/2017 0841   LDLCALC 169 (H) 04/21/2017 0841      Wt Readings from Last 3 Encounters:  01/05/18 171 lb 8 oz (77.8 kg)  01/04/18 173 lb 2 oz (78.5 kg)  10/30/17 181 lb 1.6 oz (82.1 kg)  No flowsheet data found.    ASSESSMENT AND PLAN:  1. Paroxysmal supraventricular tachycardia: She continues to have intermittent episodes of tachycardia but these usually respond well to metoprolol.  She takes this medication as needed given that taking it around-the-clock caused significant fatigue.  I think it is reasonable to continue with current management.     Disposition:   FU with me in 12 months  Signed,  Lorine BearsMuhammad Alixandrea Milleson, MD  01/05/2018 3:02 PM    [facility]

## 2018-01-12 LAB — URINE CULTURE
MICRO NUMBER:: 90984002
Result:: NO GROWTH
SPECIMEN QUALITY:: ADEQUATE

## 2018-01-14 ENCOUNTER — Other Ambulatory Visit (INDEPENDENT_AMBULATORY_CARE_PROVIDER_SITE_OTHER): Payer: Medicare Other

## 2018-01-14 DIAGNOSIS — E785 Hyperlipidemia, unspecified: Secondary | ICD-10-CM | POA: Diagnosis not present

## 2018-01-14 LAB — LIPID PANEL
CHOLESTEROL: 214 mg/dL — AB (ref 0–200)
HDL: 60.4 mg/dL (ref >39.00)
LDL CALC: 134 mg/dL — AB (ref 0–99)
NonHDL: 153.99
TRIGLYCERIDES: 99 mg/dL (ref 0.0–149.0)
Total CHOL/HDL Ratio: 4
VLDL: 19.8 mg/dL (ref 0.0–40.0)

## 2018-01-19 ENCOUNTER — Encounter: Payer: Self-pay | Admitting: Internal Medicine

## 2018-01-19 ENCOUNTER — Ambulatory Visit: Payer: Medicare Other | Admitting: Internal Medicine

## 2018-01-19 VITALS — BP 124/66 | HR 65 | Temp 98.6°F | Ht 62.0 in | Wt 173.2 lb

## 2018-01-19 DIAGNOSIS — K449 Diaphragmatic hernia without obstruction or gangrene: Secondary | ICD-10-CM

## 2018-01-19 DIAGNOSIS — I471 Supraventricular tachycardia: Secondary | ICD-10-CM

## 2018-01-19 DIAGNOSIS — Z1329 Encounter for screening for other suspected endocrine disorder: Secondary | ICD-10-CM

## 2018-01-19 DIAGNOSIS — R030 Elevated blood-pressure reading, without diagnosis of hypertension: Secondary | ICD-10-CM

## 2018-01-19 DIAGNOSIS — R7303 Prediabetes: Secondary | ICD-10-CM

## 2018-01-19 DIAGNOSIS — E785 Hyperlipidemia, unspecified: Secondary | ICD-10-CM | POA: Diagnosis not present

## 2018-01-19 DIAGNOSIS — R319 Hematuria, unspecified: Secondary | ICD-10-CM | POA: Diagnosis not present

## 2018-01-19 NOTE — Patient Instructions (Addendum)
Try Red yeast rice 600 mg 2x per day  Rec Tdap and prevnar vaccine at your pharmacy  Vitamin D3 1000 IU daily with calcium 600 1-2 x per day    Cholesterol Cholesterol is a white, waxy, fat-like substance that is needed by the human body in small amounts. The liver makes all the cholesterol we need. Cholesterol is carried from the liver by the blood through the blood vessels. Deposits of cholesterol (plaques) may build up on blood vessel (artery) walls. Plaques make the arteries narrower and stiffer. Cholesterol plaques increase the risk for heart attack and stroke. You cannot feel your cholesterol level even if it is very high. The only way to know that it is high is to have a blood test. Once you know your cholesterol levels, you should keep a record of the test results. Work with your health care provider to keep your levels in the desired range. What do the results mean?  Total cholesterol is a rough measure of all the cholesterol in your blood.  LDL (low-density lipoprotein) is the "bad" cholesterol. This is the type that causes plaque to build up on the artery walls. You want this level to be low.  HDL (high-density lipoprotein) is the "good" cholesterol because it cleans the arteries and carries the LDL away. You want this level to be high.  Triglycerides are fat that the body can either burn for energy or store. High levels are closely linked to heart disease. What are the desired levels of cholesterol?  Total cholesterol below 200.  LDL below 100 for people who are at risk, below 70 for people at very high risk.  HDL above 40 is good. A level of 60 or higher is considered to be protective against heart disease.  Triglycerides below 150. How can I lower my cholesterol? Diet Follow your diet program as told by your health care provider.  Choose fish or white meat chicken and [place], roasted or baked. Limit fatty cuts of red meat, fried foods, and processed meats, such as sausage  and lunch meats.  Eat lots of fresh fruits and vegetables.  Choose whole grains, beans, pasta, potatoes, and cereals.  Choose olive oil, corn oil, or canola oil, and use only small amounts.  Avoid butter, mayonnaise, shortening, or palm kernel oils.  Avoid foods with trans fats.  Drink skim or nonfat milk and eat low-fat or nonfat yogurt and cheeses. Avoid whole milk, cream, ice cream, egg yolks, and full-fat cheeses.  Healthier desserts include angel food cake, ginger snaps, animal crackers, hard candy, popsicles, and low-fat or nonfat frozen yogurt. Avoid pastries, cakes, pies, and cookies.  Exercise  Follow your exercise program as told by your health care provider. A regular program: ? Helps to decrease LDL and raise HDL. ? Helps with weight control.  Do things that increase your activity level, such as gardening, walking, and taking the stairs.  Ask your health care provider about ways that you can be more active in your daily life.  Medicine  Take over-the-counter and prescription medicines only as told by your health care provider. ? Medicine may be prescribed by your health care provider to help lower cholesterol and decrease the risk for heart disease. This is usually done if diet and exercise have failed to bring down cholesterol levels. ? If you have several risk factors, you may need medicine even if your levels are normal.  This information is not intended to replace advice given to you by your health  care provider. Make sure you discuss any questions you have with your health care provider. Document Released: 01/28/2001 Document Revised: 12/01/2015 Document Reviewed: 11/03/2015 Elsevier Interactive Patient Education  [employer].

## 2018-01-19 NOTE — Progress Notes (Signed)
No chief complaint on file.  F/u  1. Blood in urine resolved and completed AbX though culture negative UTI  2. S/p right TKR Dr. Mayer Camel 10/2017 doing well  3. Large hiatal hernia taking zantac 75 mg bid doing well and luq ab pain resolved and also back pain resolved pending EGD/colonoscopy Dr. Tiffany Kocher 03/2018 4. HLD declines statin disc zetia and red yeast rice wants to be on red yeast rice.    Review of Systems  Constitutional: Positive for weight loss.  HENT: Negative for hearing loss.   Eyes: Negative for blurred vision.  Respiratory: Negative for shortness of breath.   Cardiovascular: Negative for chest pain and palpitations.       SVT x2 times w/in 4-5 months   Gastrointestinal: Negative for abdominal pain.  Genitourinary: Negative for hematuria.  Musculoskeletal: Negative for back pain.  Skin: Negative for rash.  Neurological: Negative for headaches.  Psychiatric/Behavioral: Negative for depression.   Past Medical History:  Diagnosis Date  . Arthritis   . Atypical chest pain    a. 04/2015 Myoview: EF 78%, breast attenuation, no ischemia-->Low risk.  . Back pain    scoliosis  . Chicken pox   . Dysrhythmia    hx palpitations  . GERD (gastroesophageal reflux disease)   . History of hiatal hernia    noted on cxr  . Hyperlipidemia   . Joint pain   . Obesity, unspecified   . Paroxysmal SVT (supraventricular tachycardia) ([facility])    a. 2013 Holter: PACs/PVCs; b. 10/2011 Ehco: EF nl, no rwma, mild LVH, mild TR, PASP 51mHg; c 04/2015 SVT in ED->resolved with adenosine; c.   . Pneumonia 2010   hx of  . Scoliosis    multiple areas of back follows with Dr. KBoston Servicechiropractor    Past Surgical History:  Procedure Laterality Date  . ABDOMINAL HYSTERECTOMY     1990 ovaries intact. had 2/2 fibroids last pap 2004 neg.  ? if cervix present   . BACK SURGERY  1960's   d/t scoliosis-1964/65?  .Marland KitchenBACK SURGERY     x 3   . BREAST BIOPSY Left 2008   CORE W/CLIP - NEG  . BREAST BIOPSY      2000   . BREAST EXCISIONAL BIOPSY Right 20 + yrs ago  . BREAST SURGERY  1990   excision breast mass  . COLONOSCOPY  2007   Dr. EVira Agar . COLONOSCOPY WITH ESOPHAGOGASTRODUODENOSCOPY (EGD) AND ESOPHAGEAL DILATION (ED)    . EYE SURGERY  2007   cataracts  . FOOT SURGERY  1960's  . JOINT REPLACEMENT     total hip left with metal hardward 2016 Dr. RJudd Lienortho   . OTHER SURGICAL HISTORY  2004   Bilateral cataracts  . PARTIAL HYSTERECTOMY  1990  . REVISION TOTAL HIP ARTHROPLASTY Left 04/17/2014   DR RMayer Camel . TOTAL HIP ARTHROPLASTY  2006  . TOTAL HIP REVISION Left 04/17/2014   Procedure: LEFT TOTAL HIP REVISION;  Surgeon: FKerin Salen MD;  [facility]  Service: Orthopedics;  Laterality: Left;  . TOTAL KNEE ARTHROPLASTY Right 10/30/2017   Procedure: RIGHT TOTAL KNEE ARTHROPLASTY;  Surgeon: RFrederik Pear MD;  [facility]  Service: Orthopedics;  Laterality: Right;   Family History  Problem Relation Age of Onset  . Heart attack Father   . Heart disease Father        MI  . Diabetes Father   . Hypertension Father   . Early death Father   .  Cancer Mother        breast cancer  . Breast cancer Mother 13  . Arthritis Mother   . Dementia Mother   . Aneurysm Brother        cardiac   . Early death Brother    Social History   Socioeconomic History  . Marital status: Married    Spouse name: Not on file  . Number of children: Not on file  . Years of education: Not on file  . Highest education level: Not on file  Occupational History  . Not on file  Social Needs  . Financial resource strain: Not hard at all  . Food insecurity:    Worry: Never true    Inability: Never true  . Transportation needs:    Medical: Not on file    Non-medical: Not on file  Tobacco Use  . Smoking status: Never Smoker  . Smokeless tobacco: Never Used  Substance and Sexual Activity  . Alcohol use: No  . Drug use: No  . Sexual activity: Yes    Birth control/protection: Surgical   Lifestyle  . Physical activity:    Days per week: Not on file    Minutes per session: Not on file  . Stress: Not on file  Relationships  . Social connections:    Talks on phone: Not on file    Gets together: Not on file    Attends religious service: Not on file    Active member of club or organization: Not on file    Attends meetings of clubs or organizations: Not on file    Relationship status: Not on file  . Intimate partner violence:    Fear of current or ex partner: No    Emotionally abused: No    Physically abused: No    Forced sexual activity: No  Other Topics Concern  . 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   Allergies  Allergen Reactions  . Diltiazem Rash    CD formulation likely generalized drug rash/eruption face, trunk, arms   . Metoprolol Succinate [Metoprolol] Other (See Comments)    Succinate version causes extreme lethargy   . Tape Itching, Rash and Other (See Comments)    Reaction: blisters (adhesive tape) Paper tape is ok   Recent Results (from the past 2160 hour(s))  CBC     Status: None   Collection Time: 10/31/17  6:06 AM  Result Value Ref Range   WBC 8.5 4.0 - 10.5 K/uL   RBC 4.13 3.87 - 5.11 MIL/uL   Hemoglobin 12.3 12.0 - 15.0 g/dL   HCT 38.8 36.0 - 46.0 %   MCV 93.9 78.0 - 100.0 fL   MCH 29.8 26.0 - 34.0 pg   MCHC 31.7 30.0 - 36.0 g/dL   RDW 13.2 11.5 - 15.5 %   Platelets 205 150 - 400 K/uL    Comment: Performed at [facility], [place] [address], [place], [place] [address]  Basic metabolic panel     Status: Abnormal   Collection Time: 10/31/17  6:06 AM  Result Value Ref Range   Sodium 137 135 - 145 mmol/L   Potassium 3.9 3.5 - 5.1 mmol/L   Chloride 105 101 - 111 mmol/L  CO2 26 22 - 32 mmol/L    Glucose, Bld 183 (H) 65 - 99 mg/dL   BUN 10 6 - 20 mg/dL   Creatinine, Ser 0.77 0.44 - 1.00 mg/dL   Calcium 9.1 8.9 - 10.3 mg/dL   GFR calc non Af Amer >60 >60 mL/min   GFR calc Af Amer >60 >60 mL/min    Comment: (NOTE) The eGFR has been calculated using the CKD EPI equation. This calculation has not been validated in all clinical situations. eGFR's persistently <60 mL/min signify possible Chronic Kidney Disease.    Anion gap 6 5 - 15    Comment: Performed at [facility] [address] [place], [place] [address]  CBC     Status: Abnormal   Collection Time: 11/01/17  2:49 AM  Result Value Ref Range   WBC 6.2 4.0 - 10.5 K/uL   RBC 3.88 3.87 - 5.11 MIL/uL   Hemoglobin 11.4 (L) 12.0 - 15.0 g/dL   HCT 36.4 36.0 - 46.0 %   MCV 93.8 78.0 - 100.0 fL   MCH 29.4 26.0 - 34.0 pg   MCHC 31.3 30.0 - 36.0 g/dL   RDW 13.5 11.5 - 15.5 %   Platelets 199 150 - 400 K/uL    Comment: Performed at [facility], [place] [address] [place], [place] [address]  POCT Urinalysis Dipstick     Status: Abnormal   Collection Time: 01/04/18  8:30 AM  Result Value Ref Range   Color, UA amber    Clarity, UA clear    Glucose, UA Negative Negative   Bilirubin, UA neg    Ketones, UA 5    Spec Grav, UA <=1.005 (A) 1.010 - 1.025   Blood, UA 3+    pH, UA 5.0 5.0 - 8.0   Protein, UA Positive (A) Negative   Urobilinogen, UA 0.2 0.2 or 1.0 E.U./dL   Nitrite, UA neg    Leukocytes, UA Trace (A) Negative   Appearance     Odor    Urine Culture     Status: None   Collection Time: 01/04/18  8:39 AM  Result Value Ref Range   MICRO NUMBER: [id number]    SPECIMEN QUALITY: ADEQUATE    Sample Source URINE    STATUS: FINAL    Result: No Growth   Lipid panel     Status: Abnormal   Collection Time: 01/14/18  7:55 AM  Result Value Ref Range   Cholesterol 214 (H) 0 - 200 mg/dL    Comment: ATP III Classification       Desirable:  < 200 mg/dL               Borderline High:  200 - 239 mg/dL          High:  > = 240  mg/dL   Triglycerides 99.0 0.0 - 149.0 mg/dL    Comment: Normal:  <150 mg/dLBorderline High:  150 - 199 mg/dL   HDL 60.40 >39.00 mg/dL   VLDL 19.8 0.0 - 40.0 mg/dL   LDL Cholesterol 134 (H) 0 - 99 mg/dL   Total CHOL/HDL Ratio 4     Comment:                Men          Women1/2 Average Risk     3.4          3.3Average Risk          5.0  4.42X Average Risk          9.6          7.13X Average Risk          15.0          11.0                       NonHDL 153.99     Comment: NOTE:  Non-HDL goal should be 30 mg/dL higher than patient's LDL goal (i.e. LDL goal of < 70 mg/dL, would have non-HDL goal of < 100 mg/dL)   Objective  There is no height or weight on file to calculate BMI. Wt Readings from Last 3 Encounters:  01/05/18 171 lb 8 oz (77.8 kg)  01/04/18 173 lb 2 oz (78.5 kg)  10/30/17 181 lb 1.6 oz (82.1 kg)   Temp Readings from Last 3 Encounters:  01/04/18 98.3 F (36.8 C) (Oral)  11/01/17 98.2 F (36.8 C) (Oral)  10/20/17 98 F (36.7 C)   BP Readings from Last 3 Encounters:  01/05/18 138/80  01/04/18 134/82  11/01/17 (!) 105/55   Pulse Readings from Last 3 Encounters:  01/05/18 72  01/04/18 65  11/01/17 78    Physical Exam  Constitutional: She is oriented to person, place, and time. Vital signs are normal. She appears well-developed and well-nourished. She is cooperative.  HENT:  Head: Normocephalic and atraumatic.  Mouth/Throat: Oropharynx is clear and moist and mucous membranes are normal.  Eyes: Pupils are equal, round, and reactive to light. Conjunctivae are normal.  Cardiovascular: Normal rate, regular rhythm and normal heart sounds.  Pulmonary/Chest: Effort normal and breath sounds normal.  Neurological: She is alert and oriented to person, place, and time. Gait normal.  Skin: Skin is warm, dry and intact.  Psychiatric: She has a normal mood and affect. Her speech is normal and behavior is normal. Judgment and thought content normal. Cognition and memory  are normal.  Nursing note and vitals reviewed.   Assessment   1. Large hiatal hernia  2. Palpitations h/o SVT  3. HLD  4. Hematuria  5. HM Plan   1.  Pending EGD/colonoscopy 03/2018 Dr. Tiffany Kocher  Zantac 75 mg bid  2.  Cont prn Lopressor and f/u cards  3.  Declines statin  Recheck labs 04/2018  4.  Check UA 122019 make sure resolved if not needs further w/u  5.  Declines flu shot  Given prevnar rx, Tdap vaccine today Had not filled yet Disc shingrixprior appt  Consider hep B vaccine not immune  Immune MMR Hep C neg  Colonoscopy 2016 Dr. Juanita Craver need to get records.  -pending EGD/colonoscopy 03/2018 Dr. Tiffany Kocher  mammo 07/30/17 neg  No need pap s/p hysterectomy fibroids no h/o abnormal pap DEXA 06/07/15 normal A1C 6.0 04/21/17 Fasting labs 04/2018   Never smoker Provider: Dr. Olivia Mackie McLean-Scocuzza-Internal Medicine

## 2018-03-19 ENCOUNTER — Encounter: Payer: Self-pay | Admitting: *Deleted

## 2018-03-20 ENCOUNTER — Other Ambulatory Visit: Payer: Self-pay | Admitting: Internal Medicine

## 2018-03-20 DIAGNOSIS — I471 Supraventricular tachycardia: Secondary | ICD-10-CM

## 2018-03-22 ENCOUNTER — Encounter: Payer: Self-pay | Admitting: Anesthesiology

## 2018-03-22 ENCOUNTER — Ambulatory Visit
Admission: RE | Admit: 2018-03-22 | Discharge: 2018-03-22 | Disposition: A | Discharge status: Home / Self Care | Payer: Medicare Other | Source: Ambulatory Visit | Attending: Unknown Physician Specialty | Admitting: Unknown Physician Specialty

## 2018-03-22 ENCOUNTER — Ambulatory Visit: Payer: Medicare Other | Admitting: Anesthesiology

## 2018-03-22 ENCOUNTER — Encounter
Admission: RE | Disposition: A | Discharge status: Home / Self Care | Payer: Self-pay | Source: Ambulatory Visit | Attending: Unknown Physician Specialty

## 2018-03-22 DIAGNOSIS — Z1211 Encounter for screening for malignant neoplasm of colon: Secondary | ICD-10-CM | POA: Diagnosis not present

## 2018-03-22 DIAGNOSIS — I471 Supraventricular tachycardia: Secondary | ICD-10-CM | POA: Insufficient documentation

## 2018-03-22 DIAGNOSIS — E785 Hyperlipidemia, unspecified: Secondary | ICD-10-CM | POA: Insufficient documentation

## 2018-03-22 DIAGNOSIS — M1990 Unspecified osteoarthritis, unspecified site: Secondary | ICD-10-CM | POA: Insufficient documentation

## 2018-03-22 DIAGNOSIS — Z7982 Long term (current) use of aspirin: Secondary | ICD-10-CM | POA: Insufficient documentation

## 2018-03-22 DIAGNOSIS — K640 First degree hemorrhoids: Secondary | ICD-10-CM | POA: Insufficient documentation

## 2018-03-22 DIAGNOSIS — K222 Esophageal obstruction: Secondary | ICD-10-CM | POA: Insufficient documentation

## 2018-03-22 DIAGNOSIS — K219 Gastro-esophageal reflux disease without esophagitis: Secondary | ICD-10-CM | POA: Diagnosis not present

## 2018-03-22 DIAGNOSIS — Z79899 Other long term (current) drug therapy: Secondary | ICD-10-CM | POA: Insufficient documentation

## 2018-03-22 DIAGNOSIS — K2980 Duodenitis without bleeding: Secondary | ICD-10-CM | POA: Insufficient documentation

## 2018-03-22 DIAGNOSIS — K449 Diaphragmatic hernia without obstruction or gangrene: Secondary | ICD-10-CM | POA: Diagnosis not present

## 2018-03-22 HISTORY — PX: COLONOSCOPY WITH PROPOFOL: SHX5780

## 2018-03-22 HISTORY — PX: ESOPHAGOGASTRODUODENOSCOPY (EGD) WITH PROPOFOL: SHX5813

## 2018-03-22 SURGERY — ESOPHAGOGASTRODUODENOSCOPY (EGD) WITH PROPOFOL
Anesthesia: General

## 2018-03-22 MED ORDER — SODIUM CHLORIDE 0.9 % IV SOLN: INTRAVENOUS | Status: DC

## 2018-03-22 MED ORDER — PIPERACILLIN-TAZOBACTAM 3.375 G IVPB
INTRAVENOUS | Status: AC
  Filled 2018-03-22: qty 50

## 2018-03-22 MED ORDER — PIPERACILLIN-TAZOBACTAM 3.375 G IVPB 30 MIN
3.3750 g | Freq: Once | INTRAVENOUS | Status: AC
  Administered 2018-03-22: 3.375 g via INTRAVENOUS
  Filled 2018-03-22: qty 50

## 2018-03-22 MED ORDER — SODIUM CHLORIDE 0.9 % IV SOLN
INTRAVENOUS | Status: DC
  Administered 2018-03-22: 13:00:00 via INTRAVENOUS

## 2018-03-22 MED ORDER — PROPOFOL 500 MG/50ML IV EMUL
INTRAVENOUS | Status: AC
  Filled 2018-03-22: qty 50

## 2018-03-22 MED ORDER — PROPOFOL 500 MG/50ML IV EMUL
INTRAVENOUS | Status: DC | PRN
  Administered 2018-03-22: 135 ug/kg/min via INTRAVENOUS

## 2018-03-22 MED ORDER — PROPOFOL 10 MG/ML IV BOLUS
INTRAVENOUS | Status: DC | PRN
  Administered 2018-03-22: 90 mg via INTRAVENOUS

## 2018-03-22 MED ORDER — LIDOCAINE HCL (PF) 2 % IJ SOLN
INTRAMUSCULAR | Status: AC
  Filled 2018-03-22: qty 10

## 2018-03-22 MED ORDER — LIDOCAINE HCL (CARDIAC) PF 100 MG/5ML IV SOSY
PREFILLED_SYRINGE | INTRAVENOUS | Status: DC | PRN
  Administered 2018-03-22: 50 mg via INTRAVENOUS

## 2018-03-22 NOTE — Anesthesia Postprocedure Evaluation (Signed)
Anesthesia Post Note  Patient: Kristy Mejia  Procedure(s) Performed: ESOPHAGOGASTRODUODENOSCOPY (EGD) WITH PROPOFOL (N/A ) COLONOSCOPY WITH PROPOFOL (N/A )  Patient location during evaluation: Endoscopy Anesthesia Type: General Level of consciousness: awake and alert Pain management: pain level controlled Vital Signs Assessment: post-procedure vital signs reviewed and stable Respiratory status: spontaneous breathing, nonlabored ventilation, respiratory function stable and patient connected to nasal cannula oxygen Cardiovascular status: blood pressure returned to baseline and stable Postop Assessment: no apparent nausea or vomiting Anesthetic complications: no     Last Vitals:  Vitals:   03/22/18 1343 03/22/18 1403  BP:  114/66  Pulse:    Resp:    Temp: (!) 36.3 C   SpO2:      Last Pain:  Vitals:   03/22/18 1403  TempSrc:   PainSc: 0-No pain                 Cleda Mccreedy Piscitello

## 2018-03-22 NOTE — Op Note (Signed)
[facility] Gastroenterology Patient Name: Kristy Mejia Procedure Date: 03/22/2018 12:51 PM MRN: [id number] Account #: [id number] Date of Birth: 10/18/1945 Admit Type: Outpatient Age: 72 Room: [facility] ENDO ROOM 1 Gender: Female Note Status: Finalized Procedure:            Colonoscopy Indications:          Screening for colorectal malignant neoplasm Providers:            Scot Jun, MD Referring MD:         Pasty Spillers Mclean-Scocuzza MD, MD (Referring MD) Medicines:            Propofol per Anesthesia Complications:        No immediate complications. Procedure:            Pre-Anesthesia Assessment:                       - After reviewing the risks and benefits, the patient                        was deemed in satisfactory condition to undergo the                        procedure.                       After obtaining informed consent, the colonoscope was                        passed under direct vision. Throughout the procedure,                        the patient's blood pressure, pulse, and oxygen                        saturations were monitored continuously. The                        Colonoscope was introduced through the anus and                        advanced to the the cecum, identified by appendiceal                        orifice and ileocecal valve. The colonoscopy was                        somewhat difficult due to a tortuous colon. Successful                        completion of the procedure was aided by applying                        abdominal pressure. The patient tolerated the procedure                        well. The quality of the bowel preparation was                        excellent. Findings:      Internal hemorrhoids were found during endoscopy. The hemorrhoids were  small, medium-sized and Grade I (internal hemorrhoids that do not       prolapse).      The exam was otherwise without abnormality. Impression:           - Internal  hemorrhoids.                       - The examination was otherwise normal.                       - No specimens collected. Recommendation:       - Repeat colonoscopy in 10 years for screening purposes. Scot Jun, MD 03/22/2018 1:42:00 PM This report has been signed electronically. Number of Addenda: 0 Note Initiated On: 03/22/2018 12:51 PM Scope Withdrawal Time: 0 hours 6 minutes 2 seconds  Total Procedure Duration: 0 hours 15 minutes 7 seconds       [facility]

## 2018-03-22 NOTE — Anesthesia Preprocedure Evaluation (Signed)
Anesthesia Evaluation  Patient identified by MRN, date of birth, ID band Patient awake    Reviewed: Allergy & Precautions, H&P , NPO status , Patient's Chart, lab work & pertinent test results  History of Anesthesia Complications Negative for: history of anesthetic complications  Airway Mallampati: III  TM Distance: <3 FB Neck ROM: limited    Dental  (+) Chipped, Poor Dentition   Pulmonary neg shortness of breath, pneumonia,           Cardiovascular Exercise Tolerance: Good (-) angina(-) Past MI and (-) DOE + dysrhythmias Supra Ventricular Tachycardia      Neuro/Psych negative neurological ROS  negative psych ROS   GI/Hepatic Neg liver ROS, hiatal hernia, GERD  ,  Endo/Other  negative endocrine ROS  Renal/GU negative Renal ROS  negative genitourinary   Musculoskeletal  (+) Arthritis ,   Abdominal   Peds  Hematology negative hematology ROS (+)   Anesthesia Other Findings Past Medical History: No date: Arthritis No date: Atypical chest pain     Comment:  a. 04/2015 Myoview: EF 78%, breast attenuation, no               ischemia-->Low risk. No date: Back pain     Comment:  scoliosis No date: Chicken pox No date: Dysrhythmia     Comment:  hx palpitations No date: GERD (gastroesophageal reflux disease) No date: History of hiatal hernia     Comment:  noted on cxr No date: Hyperlipidemia No date: Joint pain No date: Obesity, unspecified No date: Paroxysmal SVT (supraventricular tachycardia) ([facility])     Comment:  a. 2013 Holter: PACs/PVCs; b. 10/2011 Ehco: EF nl, no               rwma, mild LVH, mild TR, PASP ; c 04/2015 SVT in               ED->resolved with adenosine; c.  2010: Pneumonia     Comment:  hx of No date: Scoliosis     Comment:  multiple areas of back follows with Dr. Dyke Maes               chiropractor   Past Surgical History: No date: ABDOMINAL HYSTERECTOMY     Comment:  1990 ovaries  intact. had 2/2 fibroids last pap 2004 neg.              ? if cervix present  1960's: BACK SURGERY     Comment:  d/t scoliosis-1964/65? No date: BACK SURGERY     Comment:  x 3  2008: BREAST BIOPSY; Left     Comment:  CORE W/CLIP - NEG No date: BREAST BIOPSY     Comment:  2000  20 + yrs ago: BREAST EXCISIONAL BIOPSY; Right 1990: BREAST SURGERY     Comment:  excision breast mass 2007: COLONOSCOPY     Comment:  Dr. Mechele Collin No date: COLONOSCOPY WITH ESOPHAGOGASTRODUODENOSCOPY (EGD) AND  ESOPHAGEAL DILATION (ED) 2007: EYE SURGERY     Comment:  cataracts 1960's: FOOT SURGERY No date: JOINT REPLACEMENT     Comment:  total hip left with metal hardward 2016 Dr. Alphonsa Gin ortho  2004: OTHER SURGICAL HISTORY     Comment:  Bilateral cataracts 1990: PARTIAL HYSTERECTOMY 04/17/2014: REVISION TOTAL HIP ARTHROPLASTY; Left     Comment:  DR Turner Daniels 2006: TOTAL HIP ARTHROPLASTY 04/17/2014: TOTAL HIP REVISION; Left     Comment:  Procedure:  LEFT TOTAL HIP REVISION;  Surgeon: Nestor Lewandowsky, MD;  Location: [facility];  Service: Orthopedics;                Laterality: Left; 10/30/2017: TOTAL KNEE ARTHROPLASTY; Right     Comment:  Procedure: RIGHT TOTAL KNEE ARTHROPLASTY;  Surgeon:               Gean Birchwood, MD;  Location: [facility];  Service:               Orthopedics;  Laterality: Right;  BMI    Body Mass Index:  31.09 kg/m      Reproductive/Obstetrics negative OB ROS                             Anesthesia Physical Anesthesia Plan  ASA: III  Anesthesia Plan: General   Post-op Pain Management:    Induction: Intravenous  PONV Risk Score and Plan: Propofol infusion and TIVA  Airway Management Planned: Natural Airway and Nasal Cannula  Additional Equipment:   Intra-op Plan:   Post-operative Plan:   Informed Consent: I have reviewed the patients History and Physical, chart, labs and discussed the procedure including the risks,  benefits and alternatives for the proposed anesthesia with the patient or authorized representative who has indicated his/her understanding and acceptance.   Dental Advisory Given  Plan Discussed with: Anesthesiologist, CRNA and Surgeon  Anesthesia Plan Comments: (Patient consented for risks of anesthesia including but not limited to:  - adverse reactions to medications - risk of intubation if required - damage to teeth, lips or other oral mucosa - sore throat or hoarseness - Damage to heart, brain, lungs or loss of life  Patient voiced understanding.)        Anesthesia Quick Evaluation

## 2018-03-22 NOTE — Anesthesia Post-op Follow-up Note (Signed)
Anesthesia QCDR form completed.        

## 2018-03-22 NOTE — Op Note (Signed)
[facility] Patient Name: Kristy Mejia Procedure Date: 03/22/2018 12:53 PM MRN: [id number] Account #: [id number] Date of Birth: July 23, 1945 Admit Type: Outpatient Age: 72 Room: [facility] ENDO ROOM 1 Gender: Female Note Status: Finalized Procedure:            Upper GI endoscopy Indications:          Dysphagia Providers:            Scot Jun, MD Referring MD:         Pasty Spillers Mclean-Scocuzza MD, MD (Referring MD) Medicines:            Propofol per Anesthesia Complications:        No immediate complications. Procedure:            Pre-Anesthesia Assessment:                       - After reviewing the risks and benefits, the patient                        was deemed in satisfactory condition to undergo the                        procedure.                       After obtaining informed consent, the endoscope was                        passed under direct vision. Throughout the procedure,                        the patient's blood pressure, pulse, and oxygen                        saturations were monitored continuously. The Endoscope                        was introduced through the mouth, and advanced to the                        second part of duodenum. The upper GI endoscopy was                        accomplished without difficulty. The patient tolerated                        the procedure well. Findings:      One benign-appearing, intrinsic moderate stenosis was found 32 cm from       the incisors. This stenosis measured 1 cm (in length). The stenosis was       traversed. A TTS dilator was passed through the scope. Dilation with a       15-16.5-18 mm balloon dilator was performed to 16.5 mm. The dilation       site was examined and showed moderate improvement in luminal narrowing.      A medium-sized hiatal hernia was present.      Patchy moderate inflammation characterized by congestion (edema),       erythema and granularity was found in  the duodenal bulb. Impression:           - Benign-appearing esophageal stenosis. Dilated.                       -  Medium-sized hiatal hernia.                       - Duodenitis.                       - No specimens collected. Recommendation:       - soft food for 3 days, eat slowly, chew well, take                        small bites Scot Jun, MD 03/22/2018 1:21:21 PM This report has been signed electronically. Number of Addenda: 0 Note Initiated On: 03/22/2018 12:53 PM      [facility]
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
## 2018-03-22 NOTE — Transfer of Care (Signed)
Immediate Anesthesia Transfer of Care Note  Patient: Kristy Mejia  Procedure(s) Performed: ESOPHAGOGASTRODUODENOSCOPY (EGD) WITH PROPOFOL (N/A ) COLONOSCOPY WITH PROPOFOL (N/A )  Patient Location: PACU and Endoscopy Unit  Anesthesia Type:General  Level of Consciousness: drowsy  Airway & Oxygen Therapy: Patient Spontanous Breathing  Post-op Assessment: Report given to RN and Post -op Vital signs reviewed and stable  Post vital signs: Reviewed and stable  Last Vitals:  Vitals Value Taken Time  BP 103/63 03/22/2018  1:43 PM  Temp    Pulse 81 03/22/2018  1:43 PM  Resp 20 03/22/2018  1:43 PM  SpO2 96 % 03/22/2018  1:43 PM  Vitals shown include unvalidated device data.  Last Pain:  Vitals:   03/22/18 1233  TempSrc: Tympanic  PainSc: 0-No pain         Complications: No apparent anesthesia complications

## 2018-03-23 ENCOUNTER — Encounter: Payer: Self-pay | Admitting: Unknown Physician Specialty

## 2018-05-10 ENCOUNTER — Other Ambulatory Visit (INDEPENDENT_AMBULATORY_CARE_PROVIDER_SITE_OTHER): Payer: Medicare Other

## 2018-05-10 DIAGNOSIS — E785 Hyperlipidemia, unspecified: Secondary | ICD-10-CM | POA: Diagnosis not present

## 2018-05-10 DIAGNOSIS — R030 Elevated blood-pressure reading, without diagnosis of hypertension: Secondary | ICD-10-CM | POA: Diagnosis not present

## 2018-05-10 DIAGNOSIS — R7303 Prediabetes: Secondary | ICD-10-CM | POA: Diagnosis not present

## 2018-05-10 DIAGNOSIS — R319 Hematuria, unspecified: Secondary | ICD-10-CM

## 2018-05-10 DIAGNOSIS — Z1329 Encounter for screening for other suspected endocrine disorder: Secondary | ICD-10-CM | POA: Diagnosis not present

## 2018-05-10 DIAGNOSIS — I471 Supraventricular tachycardia: Secondary | ICD-10-CM | POA: Diagnosis not present

## 2018-05-10 LAB — CBC WITH DIFFERENTIAL/PLATELET
BASOS ABS: 0 10*3/uL (ref 0.0–0.1)
Basophils Relative: 1.3 % (ref 0.0–3.0)
EOS PCT: 4.5 % (ref 0.0–5.0)
Eosinophils Absolute: 0.2 10*3/uL (ref 0.0–0.7)
HEMATOCRIT: 43.2 % (ref 36.0–46.0)
Hemoglobin: 14.3 g/dL (ref 12.0–15.0)
LYMPHS ABS: 1.4 10*3/uL (ref 0.7–4.0)
LYMPHS PCT: 41.5 % (ref 12.0–46.0)
MCHC: 33.1 g/dL (ref 30.0–36.0)
MCV: 91.2 fl (ref 78.0–100.0)
MONOS PCT: 13.2 % — AB (ref 3.0–12.0)
Monocytes Absolute: 0.4 10*3/uL (ref 0.1–1.0)
NEUTROS PCT: 39.5 % — AB (ref 43.0–77.0)
Neutro Abs: 1.3 10*3/uL — ABNORMAL LOW (ref 1.4–7.7)
Platelets: 214 10*3/uL (ref 150.0–400.0)
RBC: 4.74 Mil/uL (ref 3.87–5.11)
RDW: 13.6 % (ref 11.5–15.5)
WBC: 3.4 10*3/uL — ABNORMAL LOW (ref 4.0–10.5)

## 2018-05-10 LAB — LIPID PANEL
CHOLESTEROL: 236 mg/dL — AB (ref 0–200)
HDL: 61.4 mg/dL (ref >39.00)
LDL Cholesterol: 161 mg/dL — ABNORMAL HIGH (ref 0–99)
NonHDL: 174.21
Total CHOL/HDL Ratio: 4
Triglycerides: 67 mg/dL (ref 0.0–149.0)
VLDL: 13.4 mg/dL (ref 0.0–40.0)

## 2018-05-10 LAB — COMPREHENSIVE METABOLIC PANEL
ALK PHOS: 87 U/L (ref 39–117)
ALT: 11 U/L (ref 0–35)
AST: 16 U/L (ref 0–37)
Albumin: 4.1 g/dL (ref 3.5–5.2)
BILIRUBIN TOTAL: 0.6 mg/dL (ref 0.2–1.2)
BUN: 21 mg/dL (ref 6–23)
CALCIUM: 9.7 mg/dL (ref 8.4–10.5)
CO2: 27 mEq/L (ref 19–32)
Chloride: 106 mEq/L (ref 96–112)
Creatinine, Ser: 0.79 mg/dL (ref 0.40–1.20)
GFR: 92.5 mL/min (ref >60.00)
GLUCOSE: 87 mg/dL (ref 70–99)
Potassium: 4 mEq/L (ref 3.5–5.1)
Sodium: 142 mEq/L (ref 135–145)
Total Protein: 6.7 g/dL (ref 6.0–8.3)

## 2018-05-10 LAB — HEMOGLOBIN A1C: HEMOGLOBIN A1C: 5.8 % (ref 4.6–6.5)

## 2018-05-10 LAB — TSH: TSH: 2.96 u[IU]/mL (ref 0.35–4.50)

## 2018-05-11 LAB — URINALYSIS, ROUTINE W REFLEX MICROSCOPIC
Bilirubin, UA: NEGATIVE
Glucose, UA: NEGATIVE
Leukocytes, UA: NEGATIVE
Nitrite, UA: NEGATIVE
PH UA: 6 (ref 5.0–7.5)
PROTEIN UA: NEGATIVE
RBC, UA: NEGATIVE
Specific Gravity, UA: 1.018 (ref 1.005–1.030)
Urobilinogen, Ur: 0.2 mg/dL (ref 0.2–1.0)

## 2018-05-18 ENCOUNTER — Other Ambulatory Visit: Payer: Self-pay | Admitting: Internal Medicine

## 2018-05-18 DIAGNOSIS — D72819 Decreased white blood cell count, unspecified: Secondary | ICD-10-CM

## 2018-05-21 ENCOUNTER — Ambulatory Visit: Payer: Medicare Other | Admitting: Internal Medicine

## 2018-05-21 ENCOUNTER — Encounter: Payer: Self-pay | Admitting: Internal Medicine

## 2018-05-21 VITALS — BP 110/80 | HR 64 | Temp 98.1°F | Ht 62.0 in | Wt 174.4 lb

## 2018-05-21 DIAGNOSIS — K449 Diaphragmatic hernia without obstruction or gangrene: Secondary | ICD-10-CM | POA: Diagnosis not present

## 2018-05-21 DIAGNOSIS — R7303 Prediabetes: Secondary | ICD-10-CM | POA: Diagnosis not present

## 2018-05-21 DIAGNOSIS — D72819 Decreased white blood cell count, unspecified: Secondary | ICD-10-CM | POA: Diagnosis not present

## 2018-05-21 DIAGNOSIS — K2980 Duodenitis without bleeding: Secondary | ICD-10-CM

## 2018-05-21 DIAGNOSIS — R030 Elevated blood-pressure reading, without diagnosis of hypertension: Secondary | ICD-10-CM

## 2018-05-21 DIAGNOSIS — E785 Hyperlipidemia, unspecified: Secondary | ICD-10-CM

## 2018-05-21 NOTE — Patient Instructions (Addendum)
Pepcid AC  [place], Siggis, Nosa, Kefir (lifeway) -low fat options   Mammogram due 07/31/2018  Cinnamon good for blood sugar 500 mg 2x per day     Leukopenia Leukopenia is a condition in which you have a low number of white blood cells. White blood cells help the body to fight infections. The number of white blood cells in the body varies from person to person. There are five types of white blood cells. Two types (lymphocytes and neutrophils) make up most of the white blood cell count. When lymphocytes are low, the condition is called lymphocytopenia. When neutrophils are low, it is called neutropenia. Neutropenia is the most dangerous type of leukopenia because it can lead to dangerous infections. What are the causes? This condition is commonly caused by damage to soft tissue inside of the bones (bone marrow), which is where most white blood cells are made. Bone marrow can get damaged by:  Medicine or X-ray treatments for cancer (chemotherapy or radiation therapy).  Serious infections.  Cancer of the white blood cells (leukemia, lymphoma, or myeloma).  Medicines, including: ? Certain antibiotics. ? Certain heart medicines. ? Steroids. ? Certain medicines used to treat diseases of the immune system (autoimmune diseases), like rheumatoid arthritis. Leukopenia also happens when white blood cells are destroyed after leaving the bone marrow, which may result from:  Liver disease.  Autoimmune disease.  Vitamin B deficiencies. What are the signs or symptoms? One of the most common signs of leukopenia, especially severe neutropenia, is having a lot of bacterial infections. Different infections have different symptoms. An infection in your lungs may cause coughing. A urinary tract infection may cause frequent urination and a burning sensation. You may also get infections of the blood, skin, rectum, throat, sinuses, or ears. Some people have no symptoms. If you do have symptoms, they may  include:  Fever.  Fatigue.  Swollen glands (lymph nodes).  Painful mouth ulcers.  Gum disease. How is this diagnosed? This condition may be diagnosed based on:  Your medical history.  A physical exam to check for swollen lymph nodes and an enlarged spleen. Your spleen is an organ on the left side of your body that stores white blood cells.  Tests, such as: ? A complete blood count. This blood test counts each type of white cell. ? Bone marrow aspiration. Some bone marrow is removed to be checked under a microscope. ? Lymph node biopsy. Some lymph node tissue is removed to be checked under a microscope. ? Other types of blood tests or imaging tests. How is this treated? Treatment of leukopenia depends on the cause. Some common treatments include:  Antibiotic medicine to treat bacterial infections.  Stopping medicines that may cause leukopenia.  Medicines to stimulate neutrophil production (hematopoietic growth factors), to treat neutropenia. Follow these instructions at home:  Take over-the-counter and prescription medicines only as told by your health care provider. This includes supplements and vitamins.  If you were prescribed an antibiotic medicine, take it as told by your health care provider. Do not stop taking the antibiotic even if you start to feel better.  Preventing infection is important if you have leukopenia. To prevent infection: ? Avoid close contact with sick people. ? Wash your hands frequently with soap and water. If soap and water are not available, use hand sanitizer. ? Do not eat uncooked or undercooked meats. ? Wash fruits and vegetables before eating them. ? Do not eat or drink unpasteurized dairy products. ? Get regular dental care,  and maintain good dental hygiene. You should visit the dentist at least once every 6 months.  Keep all follow-up visits as told by your health care provider. This is important. Contact a health care provider if:  You  have chills or a fever.  You have symptoms of an infection. Get help right away if:  You have a fever that lasts for more than 2-3 days.  You have symptoms that last for more than 2-3 days.  You have trouble breathing.  You have chest pain. This information is not intended to replace advice given to you by your health care provider. Make sure you discuss any questions you have with your health care provider. Document Released: 05/10/2013 Document Revised: 03/25/2016 Document Reviewed: 03/25/2016 Elsevier Interactive Patient Education  2019 [employer]   Consider Zetia/Ezetimibe Tablets What is this medicine? EZETIMIBE (ez ET i mibe) blocks the absorption of cholesterol from the stomach. It can help lower blood cholesterol for patients who are at risk of getting heart disease or a stroke. It is only for patients whose cholesterol level is not controlled by diet. This medicine may be used for other purposes; ask your health care provider or pharmacist if you have questions. COMMON BRAND NAME(S): Zetia What should I tell my health care provider before I take this medicine? They need to know if you have any of these conditions: -liver disease -an unusual or allergic reaction to ezetimibe, medicines, foods, dyes, or preservatives -pregnant or trying to get pregnant -breast-feeding How should I use this medicine? Take this medicine by mouth with a glass of water. Follow the directions on the prescription label. This medicine can be taken with or without food. Take your doses at regular intervals. Do not take your medicine more often than directed. Talk to your pediatrician regarding the use of this medicine in children. Special care may be needed. Overdosage: If you think you have taken too much of this medicine contact a poison control center or emergency room at once. NOTE: This medicine is only for you. Do not share this medicine with others. What if I miss a dose? If you miss a  dose, take it as soon as you can. If it is almost time for your next dose, take only that dose. Do not take double or extra doses. What may interact with this medicine? Do not take this medicine with any of the following medications: -fenofibrate -gemfibrozil This medicine may also interact with the following medications: -antacids -cyclosporine -herbal medicines like red yeast rice -other medicines to lower cholesterol or triglycerides This list may not describe all possible interactions. Give your health care provider a list of all the medicines, herbs, non-prescription drugs, or dietary supplements you use. Also tell them if you smoke, drink alcohol, or use illegal drugs. Some items may interact with your medicine. What should I watch for while using this medicine? Visit your doctor or health care professional for regular checks on your progress. You will need to have your cholesterol levels checked. If you are also taking some other cholesterol medicines, you will also need to have tests to make sure your liver is working properly. Tell your doctor or health care professional if you get any unexplained muscle pain, tenderness, or weakness, especially if you also have a fever and tiredness. You need to follow a low-cholesterol, low-fat diet while you are taking this medicine. This will decrease your risk of getting heart and blood vessel disease. Exercising and avoiding alcohol and smoking can  also help. Ask your doctor or dietician for advice. What side effects may I notice from receiving this medicine? Side effects that you should report to your doctor or health care professional as soon as possible: -allergic reactions like skin rash, itching or hives, swelling of the face, lips, or tongue -dark yellow or brown urine -unusually weak or tired -yellowing of the skin or eyes Side effects that usually do not require medical attention (report to your doctor or health care professional if they  continue or are bothersome): -diarrhea -dizziness -headache -stomach upset or pain This list may not describe all possible side effects. Call your doctor for medical advice about side effects. You may report side effects to FDA at 1-800-FDA-1088. Where should I keep my medicine? Keep out of the reach of children. Store at room temperature between 15 and 30 degrees C (59 and 86 degrees F). Protect from moisture. Keep container tightly closed. Throw away any unused medicine after the expiration date. NOTE: This sheet is a summary. It may not cover all possible information. If you have questions about this medicine, talk to your doctor, pharmacist, or health care provider.  2019 Elsevier/Gold Standard (2011-11-10 15:39:09)    Food Choices for Gastroesophageal Reflux Disease, Adult When you have gastroesophageal reflux disease (GERD), the foods you eat and your eating habits are very important. Choosing the right foods can help ease the discomfort of GERD. Consider working with a diet and nutrition specialist (dietitian) to help you make healthy food choices. What general guidelines should I follow?  Eating plan  Choose healthy foods low in fat, such as fruits, vegetables, whole grains, low-fat dairy products, and lean meat, fish, and poultry.  Eat frequent, small meals instead of three large meals each day. Eat your meals slowly, in a relaxed setting. Avoid bending over or lying down until 2-3 hours after eating.  Limit high-fat foods such as fatty meats or fried foods.  Limit your intake of oils, butter, and shortening to less than 8 teaspoons each day.  Avoid the following: ? Foods that cause symptoms. These may be different for different people. Keep a food diary to keep track of foods that cause symptoms. ? Alcohol. ? Drinking large amounts of liquid with meals. ? Eating meals during the 2-3 hours before bed.  Cook foods using methods other than frying. This may include baking,  grilling, or broiling. Lifestyle  Maintain a healthy weight. Ask your health care provider what weight is healthy for you. If you need to lose weight, work with your health care provider to do so safely.  Exercise for at least 30 minutes on 5 or more days each week, or as told by your health care provider.  Avoid wearing clothes that fit tightly around your waist and chest.  Do not use any products that contain nicotine or tobacco, such as cigarettes and e-cigarettes. If you need help quitting, ask your health care provider.  Sleep with the head of your bed raised. Use a wedge under the mattress or blocks under the bed frame to raise the head of the bed. What foods are not recommended? The items listed may not be a complete list. Talk with your dietitian about what dietary choices are best for you. Grains Pastries or quick breads with added fat. [place] toast. Vegetables Deep fried vegetables. [place] fries. Any vegetables prepared with added fat. Any vegetables that cause symptoms. For some people this may include tomatoes and tomato products, chili peppers, onions and garlic, and  horseradish. Fruits Any fruits prepared with added fat. Any fruits that cause symptoms. For some people this may include citrus fruits, such as oranges, grapefruit, pineapple, and lemons. Meats and other protein foods High-fat meats, such as fatty beef or pork, hot dogs, ribs, ham, sausage, salami and bacon. Fried meat or protein, including fried fish and fried chicken. Nuts and nut butters. Dairy Whole milk and chocolate milk. Sour cream. Cream. Ice cream. Cream cheese. Milk shakes. Beverages Coffee and tea, with or without caffeine. Carbonated beverages. Sodas. Energy drinks. Fruit juice made with acidic fruits (such as orange or grapefruit). Tomato juice. Alcoholic drinks. Fats and oils Butter. Margarine. Shortening. Ghee. Sweets and desserts Chocolate and cocoa. Donuts. Seasoning and other foods Pepper.  Peppermint and spearmint. Any condiments, herbs, or seasonings that cause symptoms. For some people, this may include curry, hot sauce, or vinegar-based salad dressings. Summary  When you have gastroesophageal reflux disease (GERD), food and lifestyle choices are very important to help ease the discomfort of GERD.  Eat frequent, small meals instead of three large meals each day. Eat your meals slowly, in a relaxed setting. Avoid bending over or lying down until 2-3 hours after eating.  Limit high-fat foods such as fatty meat or fried foods. This information is not intended to replace advice given to you by your health care provider. Make sure you discuss any questions you have with your health care provider. Document Released: 05/05/2005 Document Revised: 05/06/2016 Document Reviewed: 05/06/2016 Elsevier Interactive Patient Education  2019 [employer].   Mediterranean Diet A Mediterranean diet refers to food and lifestyle choices that are based on the traditions of countries located on the [place]. This way of eating has been shown to help prevent certain conditions and improve outcomes for people who have chronic diseases, like kidney disease and heart disease. What are tips for following this plan? Lifestyle  Cook and eat meals together with your family, when possible.  Drink enough fluid to keep your urine clear or pale yellow.  Be physically active every day. This includes: ? Aerobic exercise like running or swimming. ? Leisure activities like gardening, walking, or housework.  Get 7-8 hours of sleep each night.  If recommended by your health care provider, drink red wine in moderation. This means 1 glass a day for nonpregnant women and 2 glasses a day for men. A glass of wine equals 5 oz (150 mL). Reading food labels   Check the serving size of packaged foods. For foods such as rice and pasta, the serving size refers to the amount of cooked product, not dry.  Check  the total fat in packaged foods. Avoid foods that have saturated fat or trans fats.  Check the ingredients list for added sugars, such as corn syrup. Shopping  At the grocery store, buy most of your food from the areas near the walls of the store. This includes: ? Fresh fruits and vegetables (produce). ? Grains, beans, nuts, and seeds. Some of these may be available in unpackaged forms or large amounts (in bulk). ? Fresh seafood. ? Poultry and eggs. ? Low-fat dairy products.  Buy whole ingredients instead of prepackaged foods.  Buy fresh fruits and vegetables in-season from local farmers markets.  Buy frozen fruits and vegetables in resealable bags.  If you do not have access to quality fresh seafood, buy precooked frozen shrimp or canned fish, such as tuna, salmon, or sardines.  Buy small amounts of raw or cooked vegetables, salads, or olives from the  deli or salad bar at your store.  Stock your pantry so you always have certain foods on hand, such as olive oil, canned tuna, canned tomatoes, rice, pasta, and beans. Cooking  Cook foods with extra-virgin olive oil instead of using butter or other vegetable oils.  Have meat as a side dish, and have vegetables or grains as your main dish. This means having meat in small portions or adding small amounts of meat to foods like pasta or stew.  Use beans or vegetables instead of meat in common dishes like chili or lasagna.  Experiment with different cooking methods. Try roasting or broiling vegetables instead of steaming or sauteing them.  Add frozen vegetables to soups, stews, pasta, or rice.  Add nuts or seeds for added healthy fat at each meal. You can add these to yogurt, salads, or vegetable dishes.  Marinate fish or vegetables using olive oil, lemon juice, garlic, and fresh herbs. Meal planning   Plan to eat 1 vegetarian meal one day each week. Try to work up to 2 vegetarian meals, if possible.  Eat seafood 2 or more times a  week.  Have healthy snacks readily available, such as: ? Vegetable sticks with hummus. ? [place] yogurt. ? Fruit and nut trail mix.  Eat balanced meals throughout the week. This includes: ? Fruit: 2-3 servings a day ? Vegetables: 4-5 servings a day ? Low-fat dairy: 2 servings a day ? Fish, poultry, or lean meat: 1 serving a day ? Beans and legumes: 2 or more servings a week ? Nuts and seeds: 1-2 servings a day ? Whole grains: 6-8 servings a day ? Extra-virgin olive oil: 3-4 servings a day  Limit red meat and sweets to only a few servings a month What are my food choices?  Mediterranean diet ? Recommended ? Grains: Whole-grain pasta. Brown rice. Bulgar wheat. Polenta. Couscous. Whole-wheat bread. Orpah Cobb. ? Vegetables: Artichokes. Beets. Broccoli. Cabbage. Carrots. Eggplant. Green beans. Chard. Kale. Spinach. Onions. Leeks. Peas. Squash. Tomatoes. Peppers. Radishes. ? Fruits: Apples. Apricots. Avocado. Berries. Bananas. Cherries. Dates. Figs. Grapes. Lemons. Melon. Oranges. Peaches. Plums. Pomegranate. ? Meats and other protein foods: Beans. Almonds. Sunflower seeds. Pine nuts. Peanuts. Cod. Salmon. Scallops. Shrimp. Tuna. Tilapia. Clams. Oysters. Eggs. ? Dairy: Low-fat milk. Cheese. Greek yogurt. ? Beverages: Water. Red wine. Herbal tea. ? Fats and oils: Extra virgin olive oil. Avocado oil. Grape seed oil. ? Sweets and desserts: [place] yogurt with honey. Baked apples. Poached pears. Trail mix. ? Seasoning and other foods: Basil. Cilantro. Coriander. Cumin. Mint. Parsley. Sage. Rosemary. Tarragon. Garlic. Oregano. Thyme. Pepper. Balsalmic vinegar. Tahini. Hummus. Tomato sauce. Olives. Mushrooms. ? Limit these ? Grains: Prepackaged pasta or rice dishes. Prepackaged cereal with added sugar. ? Vegetables: Deep fried potatoes (french fries). ? Fruits: Fruit canned in syrup. ? Meats and other protein foods: Beef. Pork. Lamb. Poultry with skin. Hot dogs. Tomasa Blase. ? Dairy: Ice cream.  Sour cream. Whole milk. ? Beverages: Juice. Sugar-sweetened soft drinks. Beer. Liquor and spirits. ? Fats and oils: Butter. [place] oil. Vegetable oil. Beef fat (tallow). Lard. ? Sweets and desserts: Cookies. Cakes. Pies. Candy. ? Seasoning and other foods: Mayonnaise. Premade sauces and marinades. ? The items listed may not be a complete list. Talk with your dietitian about what dietary choices are right for you. Summary  The Mediterranean diet includes both food and lifestyle choices.  Eat a variety of fresh fruits and vegetables, beans, nuts, seeds, and whole grains.  Limit the amount of red meat and sweets  that you eat.  Talk with your health care provider about whether it is safe for you to drink red wine in moderation. This means 1 glass a day for nonpregnant women and 2 glasses a day for men. A glass of wine equals 5 oz (150 mL). This information is not intended to replace advice given to you by your health care provider. Make sure you discuss any questions you have with your health care provider. Document Released: 12/27/2015 Document Revised: 01/29/2016 Document Reviewed: 12/27/2015 Elsevier Interactive Patient Education  2019 [employer].

## 2018-05-21 NOTE — Progress Notes (Signed)
Chief Complaint  Patient presents with  . Follow-up   F/u  1. HLD declines statin she wants to try diet changes  2. Duodenitis on omeprazole 40 mg qd instead of prn zantac due to recall EGD 11/4 [place] [facility] duodenitis  3. Leukopenia will refer to h/o likely benign but will refer further w/u  4. Prediabetes A1C improved 6.0 to 5.8   Review of Systems  Constitutional: Negative for weight loss.  HENT: Negative for hearing loss.   Eyes: Negative for blurred vision.  Respiratory: Negative for shortness of breath.   Cardiovascular: Negative for chest pain.  Gastrointestinal: Negative for abdominal pain.  Musculoskeletal: Negative for falls.  Skin: Negative for rash.  Neurological: Negative for headaches.  Psychiatric/Behavioral: Negative for depression.   Past Medical History:  Diagnosis Date  . Arthritis   . Atypical chest pain    a. 04/2015 Myoview: EF 78%, breast attenuation, no ischemia-->Low risk.  . Back pain    scoliosis  . Chicken pox   . Dysrhythmia    hx palpitations  . GERD (gastroesophageal reflux disease)   . History of hiatal hernia    noted on cxr  . Hyperlipidemia   . Joint pain   . Obesity, unspecified   . Paroxysmal SVT (supraventricular tachycardia) ([place])    a. 2013 Holter: PACs/PVCs; b. 10/2011 Ehco: EF nl, no rwma, mild LVH, mild TR, PASP 2mHg; c 04/2015 SVT in ED->resolved with adenosine; c.   . Pneumonia 2010   hx of  . Scoliosis    multiple areas of back follows with Dr. KBoston Servicechiropractor    Past Surgical History:  Procedure Laterality Date  . ABDOMINAL HYSTERECTOMY     1990 ovaries intact. had 2/2 fibroids last pap 2004 neg.  ? if cervix present   . BACK SURGERY  1960's   d/t scoliosis-1964/65?  .Marland KitchenBACK SURGERY     x 3   . BREAST BIOPSY Left 2008   CORE W/CLIP - NEG  . BREAST BIOPSY     2000   . BREAST EXCISIONAL BIOPSY Right 20 + yrs ago  . BREAST SURGERY  1990   excision breast mass  . COLONOSCOPY  2007   Dr. EVira Agar . COLONOSCOPY  WITH ESOPHAGOGASTRODUODENOSCOPY (EGD) AND ESOPHAGEAL DILATION (ED)    . COLONOSCOPY WITH PROPOFOL N/A 03/22/2018   Procedure: COLONOSCOPY WITH PROPOFOL;  Surgeon: EManya Silvas MD;  [facility]  Service: Endoscopy;  Laterality: N/A;  . ESOPHAGOGASTRODUODENOSCOPY (EGD) WITH PROPOFOL N/A 03/22/2018   Procedure: ESOPHAGOGASTRODUODENOSCOPY (EGD) WITH PROPOFOL;  Surgeon: EManya Silvas MD;  [facility];  Service: Endoscopy;  Laterality: N/A;  . EYE SURGERY  2007   cataracts  . FOOT SURGERY  1960's  . JOINT REPLACEMENT     total hip left with metal hardward 2016 Dr. RJudd Lienortho   . OTHER SURGICAL HISTORY  2004   Bilateral cataracts  . PARTIAL HYSTERECTOMY  1990  . REVISION TOTAL HIP ARTHROPLASTY Left 04/17/2014   DR RMayer Camel . TOTAL HIP ARTHROPLASTY  2006  . TOTAL HIP REVISION Left 04/17/2014   Procedure: LEFT TOTAL HIP REVISION;  Surgeon: FKerin Salen MD;  [facility]  Service: Orthopedics;  Laterality: Left;  . TOTAL KNEE ARTHROPLASTY Right 10/30/2017   Procedure: RIGHT TOTAL KNEE ARTHROPLASTY;  Surgeon: RFrederik Pear MD;  [facility]  Service: Orthopedics;  Laterality: Right;   Family History  Problem Relation Age of Onset  . Heart attack Father   .  Heart disease Father        MI  . Diabetes Father   . Hypertension Father   . Early death Father   . Cancer Mother        breast cancer  . Breast cancer Mother 56  . Arthritis Mother   . Dementia Mother   . Aneurysm Brother        cardiac   . Early death Brother   . Colon cancer Cousin    Social History   Socioeconomic History  . Marital status: Married    Spouse name: Not on file  . Number of children: Not on file  . Years of education: Not on file  . Highest education level: Not on file  Occupational History  . Not on file  Social Needs  . Financial resource strain: Not hard at all  . Food insecurity:    Worry: Never true    Inability: Never true  . Transportation  needs:    Medical: Not on file    Non-medical: Not on file  Tobacco Use  . Smoking status: Never Smoker  . Smokeless tobacco: Never Used  Substance and Sexual Activity  . Alcohol use: No  . Drug use: No  . Sexual activity: Yes    Birth control/protection: Surgical  Lifestyle  . Physical activity:    Days per week: Not on file    Minutes per session: Not on file  . Stress: Not on file  Relationships  . Social connections:    Talks on phone: Not on file    Gets together: Not on file    Attends religious service: Not on file    Active member of club or organization: Not on file    Attends meetings of clubs or organizations: Not on file    Relationship status: Not on file  . Intimate partner violence:    Fear of current or ex partner: No    Emotionally abused: No    Physically abused: No    Forced sexual activity: No  Other Topics Concern  . 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 aspirin EC 81 MG tablet Take 1 tablet (81 mg total) by mouth 2 (two) times daily. (Patient taking differently: Take 81 mg by mouth daily. )  . diphenhydrAMINE (BENADRYL) 25 MG tablet Take 25 mg by mouth every 6 (six) hours as needed for itching.  . meloxicam (MOBIC) 15 MG tablet Take 15 mg by mouth daily.  . metoprolol tartrate (LOPRESSOR) 25 MG tablet TAKE 1 TABLET (25 MG) BY MOUTH AS NEEDED FOR PALPITATIONS.  Marland Kitchen omeprazole (PRILOSEC) 40 MG capsule Take 40 mg by mouth daily.   Allergies  Allergen Reactions  . Diltiazem Rash    CD formulation likely generalized drug rash/eruption face, trunk, arms   . Metoprolol Succinate [Metoprolol] Other (See Comments)    Succinate version causes extreme lethargy   . Tape Itching, Rash and Other (See Comments)    Reaction: blisters (adhesive tape) Paper tape is ok   Recent Results (from the past 2160 hour(s))   Hemoglobin A1c     Status: None   Collection Time: 05/10/18  8:00 AM  Result Value Ref Range   Hgb A1c MFr Bld 5.8 4.6 - 6.5 %    Comment: Glycemic Control Guidelines for  People with Diabetes:Non Diabetic:  <6%Goal of Therapy: <7%Additional Action Suggested:  >8%   Lipid panel     Status: Abnormal   Collection Time: 05/10/18  8:00 AM  Result Value Ref Range   Cholesterol 236 (H) 0 - 200 mg/dL    Comment: ATP III Classification       Desirable:  < 200 mg/dL               Borderline High:  200 - 239 mg/dL          High:  > = 240 mg/dL   Triglycerides 67.0 0.0 - 149.0 mg/dL    Comment: Normal:  <150 mg/dLBorderline High:  150 - 199 mg/dL   HDL 61.40 >39.00 mg/dL   VLDL 13.4 0.0 - 40.0 mg/dL   LDL Cholesterol 161 (H) 0 - 99 mg/dL   Total CHOL/HDL Ratio 4     Comment:                Men          Women1/2 Average Risk     3.4          3.3Average Risk          5.0          4.42X Average Risk          9.6          7.13X Average Risk          15.0          11.0                       NonHDL 174.21     Comment: NOTE:  Non-HDL goal should be 30 mg/dL higher than patient's LDL goal (i.e. LDL goal of < 70 mg/dL, would have non-HDL goal of < 100 mg/dL)  TSH     Status: None   Collection Time: 05/10/18  8:00 AM  Result Value Ref Range   TSH 2.96 0.35 - 4.50 uIU/mL  Urinalysis, Routine w reflex microscopic     Status: Abnormal   Collection Time: 05/10/18  8:00 AM  Result Value Ref Range   Specific Gravity, UA 1.018 1.005 - 1.030   pH, UA 6.0 5.0 - 7.5   Color, UA Yellow Yellow   Appearance Ur Clear Clear   Leukocytes, UA Negative Negative   Protein, UA Negative Negative/Trace   Glucose, UA Negative Negative   Ketones, UA Trace (A) Negative   RBC, UA Negative Negative   Bilirubin, UA Negative Negative   Urobilinogen, Ur 0.2 0.2 - 1.0 mg/dL   Nitrite, UA Negative Negative   Microscopic Examination Comment     Comment: Microscopic not indicated and not performed.  CBC with Differential/Platelet      Status: Abnormal   Collection Time: 05/10/18  8:00 AM  Result Value Ref Range   WBC 3.4 (L) 4.0 - 10.5 K/uL   RBC 4.74 3.87 - 5.11 Mil/uL   Hemoglobin 14.3 12.0 - 15.0 g/dL   HCT 43.2 36.0 - 46.0 %   MCV 91.2 78.0 - 100.0 fl   MCHC 33.1 30.0 - 36.0 g/dL   RDW 13.6 11.5 - 15.5 %   Platelets 214.0 150.0 - 400.0 K/uL   Neutrophils Relative % 39.5 (L) 43.0 - 77.0 %   Lymphocytes Relative 41.5 12.0 - 46.0 %   Monocytes Relative 13.2 (H) 3.0 - 12.0 %   Eosinophils Relative 4.5 0.0 - 5.0 %  Basophils Relative 1.3 0.0 - 3.0 %   Neutro Abs 1.3 (L) 1.4 - 7.7 K/uL   Lymphs Abs 1.4 0.7 - 4.0 K/uL   Monocytes Absolute 0.4 0.1 - 1.0 K/uL   Eosinophils Absolute 0.2 0.0 - 0.7 K/uL   Basophils Absolute 0.0 0.0 - 0.1 K/uL  Comprehensive metabolic panel     Status: None   Collection Time: 05/10/18  8:00 AM  Result Value Ref Range   Sodium 142 135 - 145 mEq/L   Potassium 4.0 3.5 - 5.1 mEq/L   Chloride 106 96 - 112 mEq/L   CO2 27 19 - 32 mEq/L   Glucose, Bld 87 70 - 99 mg/dL   BUN 21 6 - 23 mg/dL   Creatinine, Ser 0.79 0.40 - 1.20 mg/dL   Total Bilirubin 0.6 0.2 - 1.2 mg/dL   Alkaline Phosphatase 87 39 - 117 U/L   AST 16 0 - 37 U/L   ALT 11 0 - 35 U/L   Total Protein 6.7 6.0 - 8.3 g/dL   Albumin 4.1 3.5 - 5.2 g/dL   Calcium 9.7 8.4 - 10.5 mg/dL   GFR 92.50 >60.00 mL/min   Objective  Body mass index is 31.9 kg/m. Wt Readings from Last 3 Encounters:  05/21/18 174 lb 6.4 oz (79.1 kg)  03/22/18 170 lb (77.1 kg)  01/19/18 173 lb 3.2 oz (78.6 kg)   Temp Readings from Last 3 Encounters:  05/21/18 98.1 F (36.7 C) (Oral)  03/22/18 (!) 97.3 F (36.3 C) (Tympanic)  01/19/18 98.6 F (37 C) (Oral)   BP Readings from Last 3 Encounters:  05/21/18 110/80  03/22/18 114/66  01/19/18 124/66   Pulse Readings from Last 3 Encounters:  05/21/18 64  03/22/18 65  01/19/18 65    Physical Exam Vitals signs and nursing note reviewed.  Constitutional:      Appearance: Normal appearance. She  is well-developed.  HENT:     Head: Normocephalic and atraumatic.     Mouth/Throat:     Mouth: Mucous membranes are moist.     Pharynx: Oropharynx is clear.  Eyes:     Pupils: Pupils are equal, round, and reactive to light.  Cardiovascular:     Rate and Rhythm: Normal rate and regular rhythm.     Heart sounds: Normal heart sounds.  Pulmonary:     Effort: Pulmonary effort is normal.     Breath sounds: Normal breath sounds.  Skin:    General: Skin is warm and dry.  Neurological:     General: No focal deficit present.     Mental Status: She is alert and oriented to person, place, and time.     Gait: Gait normal.  Psychiatric:        Attention and Perception: Attention and perception normal.        Mood and Affect: Mood and affect normal.        Speech: Speech normal.        Behavior: Behavior normal. Behavior is cooperative.        Thought Content: Thought content normal.        Cognition and Memory: Cognition and memory normal.        Judgment: Judgment normal.     Assessment   1. HLD  2. Duodenitis  3. Leukopenia  4. Prediabetes  5. HM Plan   1. Declines statin disc zetia red yeast rice did not help in the past  Given cholesterol diet info  2. On ppi  3. Refer  h/o w/u  4. Rec exercise and helathy diet choicse  5.  Declines flu shot  Given prevnar rx, Tdap vaccine todayHad not filled yet Disc shingrixprior appt  Consider hep B vaccine not immune  Immune MMR Hep C neg  Colonoscopy 2016 Dr. Juanita Craver need to get records.  -EGD/colonoscopy 03/22/2018 Dr. Tiffany Kocher int hemorrhoids f/u in 10 years and EGD moderate HH, duodenitis no bxs, esophagus dilated  mammo 07/30/17 neg order in repeat  No need paps/p hysterectomy fibroids no h/o abnormal pap DEXA 06/07/15 normal A1C 5.8 05/10/18  Fasting labs 6 months  Never smoker Provider: Dr. Olivia Mackie McLean-Scocuzza-Internal Medicine

## 2018-05-29 NOTE — Progress Notes (Signed)
[facility]  [phone] [phone]  ID: Kristy Mejia OB: [birth year]  MR#: [id number]  YKZ#:[id number]  Patient Care Team: McLean-Scocuzza, Nino Glow, MD as PCP - General (Internal Medicine) Wellington Hampshire, MD as PCP - Cardiology (Cardiology) Christene Lye, MD (General Surgery)  CHIEF COMPLAINT: Leukopenia  INTERVAL HISTORY: Patient is a 73 year old female who was noted to have a persistently decreased white blood cell count on routine blood work.  She currently feels well and is asymptomatic.  She denies any repeated fevers or infections.  She has a good appetite and denies weight loss.  She has no neurologic complaints.  She denies any chest pain or shortness of breath.  She has no nausea, vomiting, constipation, or diarrhea.  She has no urinary complaints.  Patient feels at her baseline offers no specific complaints today.  REVIEW OF SYSTEMS:   Review of Systems  Constitutional: Negative.  Negative for fever, malaise/fatigue and weight loss.  Respiratory: Negative.  Negative for cough and shortness of breath.   Cardiovascular: Negative.  Negative for chest pain and leg swelling.  Gastrointestinal: Negative.  Negative for abdominal pain.  Genitourinary: Negative.  Negative for dysuria and hematuria.  Musculoskeletal: Negative.  Negative for back pain.  Skin: Negative.  Negative for rash.  Neurological: Negative.  Negative for focal weakness, weakness and headaches.  Psychiatric/Behavioral: Negative.  The patient is not nervous/anxious.     As per HPI. Otherwise, a complete review of systems is negative.  PAST MEDICAL HISTORY: Past Medical History:  Diagnosis Date  . Arthritis   . Atypical chest pain    a. 04/2015 Myoview: EF 78%, breast attenuation, no ischemia-->Low risk.  . Back pain    scoliosis  . Chicken pox   . Dysrhythmia    hx palpitations  . GERD (gastroesophageal reflux disease)   . History of hiatal hernia    noted on cxr  . Hyperlipidemia   . Joint pain   . Obesity, unspecified   . Paroxysmal SVT (supraventricular tachycardia) ([place])    a. 2013 Holter: PACs/PVCs; b. 10/2011 Ehco: EF nl, no rwma, mild LVH, mild TR, PASP 34mHg; c 04/2015 SVT in ED->resolved with adenosine; c.   . Pneumonia 2010   hx of  . Scoliosis    multiple areas of back follows with Dr. KBoston Servicechiropractor     PAST SURGICAL HISTORY: Past Surgical History:  Procedure Laterality Date  . ABDOMINAL HYSTERECTOMY     1990 ovaries intact. had 2/2 fibroids last pap 2004 neg.  ? if cervix present   . BACK SURGERY  1960's   d/t scoliosis-1964/65?  .Marland KitchenBACK SURGERY     x 3   . BREAST BIOPSY Left 2008   CORE W/CLIP - NEG  . BREAST BIOPSY     2000   . BREAST EXCISIONAL BIOPSY Right 20 + yrs ago  . BREAST SURGERY  1990   excision breast mass  . COLONOSCOPY  2007   Dr. EVira Agar . COLONOSCOPY WITH ESOPHAGOGASTRODUODENOSCOPY (EGD) AND ESOPHAGEAL DILATION (ED)    . COLONOSCOPY WITH PROPOFOL N/A 03/22/2018   Procedure: COLONOSCOPY WITH PROPOFOL;  Surgeon: EManya Silvas MD;  [facility];  Service: Endoscopy;  Laterality: N/A;  . ESOPHAGOGASTRODUODENOSCOPY (EGD) WITH PROPOFOL N/A 03/22/2018   Procedure: ESOPHAGOGASTRODUODENOSCOPY (EGD) WITH PROPOFOL;  Surgeon: EManya Silvas MD;  [facility];  Service: Endoscopy;  Laterality: N/A;  . EYE SURGERY  2007   cataracts  . FOOT SURGERY  1960's  .  JOINT REPLACEMENT     total hip left with metal hardward 2016 Dr. Judd Lien ortho   . OTHER SURGICAL HISTORY  2004   Bilateral cataracts  . PARTIAL HYSTERECTOMY  1990  . REVISION TOTAL HIP ARTHROPLASTY Left 04/17/2014   DR Mayer Camel  . TOTAL HIP ARTHROPLASTY  2006  . TOTAL HIP REVISION Left 04/17/2014   Procedure: LEFT TOTAL HIP REVISION;  Surgeon: Kerin Salen, MD;  Location: [facility];  Service: Orthopedics;  Laterality: Left;  . TOTAL KNEE ARTHROPLASTY Right 10/30/2017   Procedure: RIGHT TOTAL KNEE  ARTHROPLASTY;  Surgeon: Frederik Pear, MD;  Location: [employer];  Service: Orthopedics;  Laterality: Right;    FAMILY HISTORY: Family History  Problem Relation Age of Onset  . Heart attack Father   . Heart disease Father        MI  . Diabetes Father   . Hypertension Father   . Early death Father   . Cancer Mother        breast cancer  . Breast cancer Mother 16  . Arthritis Mother   . Dementia Mother   . Aneurysm Brother        cardiac   . Early death Brother   . Colon cancer Cousin     ADVANCED DIRECTIVES (Y/N):  N  HEALTH MAINTENANCE: Social History   Tobacco Use  . Smoking status: Never Smoker  . Smokeless tobacco: Never Used  Substance Use Topics  . Alcohol use: No  . Drug use: No     Colonoscopy:  PAP:  Bone density:  Lipid panel:  Allergies  Allergen Reactions  . Diltiazem Rash    CD formulation likely generalized drug rash/eruption face, trunk, arms   . Metoprolol Succinate [Metoprolol] Other (See Comments)    Succinate version causes extreme lethargy   . Tape Itching, Rash and Other (See Comments)    Reaction: blisters (adhesive tape) Paper tape is ok    Current Outpatient Medications  Medication Sig Dispense Refill  . aspirin EC 81 MG tablet Take 1 tablet (81 mg total) by mouth 2 (two) times daily. (Patient taking differently: Take 81 mg by mouth daily. ) 60 tablet 0  . meloxicam (MOBIC) 15 MG tablet Take 15 mg by mouth daily.    . metoprolol tartrate (LOPRESSOR) 25 MG tablet TAKE 1 TABLET (25 MG) BY MOUTH AS NEEDED FOR PALPITATIONS. 90 tablet 1  . omeprazole (PRILOSEC) 40 MG capsule Take 40 mg by mouth daily.    . diphenhydrAMINE (BENADRYL) 25 MG tablet Take 25 mg by mouth every 6 (six) hours as needed for itching.     No current facility-administered medications for this visit.     OBJECTIVE: Vitals:   06/03/18 1321  BP: (!) 163/80  Pulse: 61  Temp: (!) 97.4 F (36.3 C)     Body mass index is 32.15 kg/m.    ECOG FS:0 -  Asymptomatic  General: Well-developed, well-nourished, no acute distress. Eyes: Pink conjunctiva, anicteric sclera. HEENT: Normocephalic, moist mucous membranes, clear oropharnyx. Lungs: Clear to auscultation bilaterally. Heart: Regular rate and rhythm. No rubs, murmurs, or gallops. Abdomen: Soft, nontender, nondistended. No organomegaly noted, normoactive bowel sounds. Musculoskeletal: No edema, cyanosis, or clubbing. Neuro: Alert, answering all questions appropriately. Cranial nerves grossly intact. Skin: No rashes or petechiae noted. Psych: Normal affect. Lymphatics: No cervical, calvicular, axillary or inguinal LAD.   LAB RESULTS:  Lab Results  Component Value Date   NA 142 05/10/2018   K 4.0 05/10/2018   CL  106 05/10/2018   CO2 27 05/10/2018   GLUCOSE 87 05/10/2018   BUN 21 05/10/2018   CREATININE 0.79 05/10/2018   CALCIUM 9.7 05/10/2018   PROT 6.7 05/10/2018   ALBUMIN 4.1 05/10/2018   AST 16 05/10/2018   ALT 11 05/10/2018   ALKPHOS 87 05/10/2018   BILITOT 0.6 05/10/2018   GFRNONAA >60 10/31/2017   GFRAA >60 10/31/2017    Lab Results  Component Value Date   WBC 3.5 (L) 06/03/2018   NEUTROABS 1.5 (L) 06/03/2018   HGB 13.2 06/03/2018   HCT 42.0 06/03/2018   MCV 92.5 06/03/2018   PLT 220 06/03/2018     STUDIES: No results found.  ASSESSMENT: Leukopenia  PLAN:  1. Leukopenia: Patient's white blood cell count is only mildly decreased at 3.5 and she is asymptomatic.  Iron stores and LDH are within normal limits.  Peripheral blood flow cytometry and antineutrophil antibodies are pending at time of dictation.  No intervention is needed.  Patient does not require bone marrow biopsy.  Return to clinic in 3 months with repeat laboratory work and further evaluation.  If her white count remains stable or improves, she likely can be discharged from clinic.  I spent a total of 45 minutes face-to-face with the patient of which greater than 50% of the visit was spent in  counseling and coordination of care as detailed above.   Patient expressed understanding and was in agreement with this plan. She also understands that She can call clinic at any time with any questions, concerns, or complaints.    Lloyd Huger, MD   06/04/2018 9:02 AM

## 2018-06-03 ENCOUNTER — Inpatient Hospital Stay: Payer: Medicare Other | Attending: Oncology | Admitting: Oncology

## 2018-06-03 ENCOUNTER — Other Ambulatory Visit: Payer: Self-pay

## 2018-06-03 ENCOUNTER — Inpatient Hospital Stay: Payer: Medicare Other

## 2018-06-03 VITALS — BP 163/80 | HR 61 | Temp 97.4°F | Ht 62.0 in | Wt 175.8 lb

## 2018-06-03 DIAGNOSIS — Z7982 Long term (current) use of aspirin: Secondary | ICD-10-CM | POA: Diagnosis not present

## 2018-06-03 DIAGNOSIS — Z79899 Other long term (current) drug therapy: Secondary | ICD-10-CM | POA: Diagnosis not present

## 2018-06-03 DIAGNOSIS — D72819 Decreased white blood cell count, unspecified: Secondary | ICD-10-CM

## 2018-06-03 DIAGNOSIS — I471 Supraventricular tachycardia: Secondary | ICD-10-CM | POA: Diagnosis not present

## 2018-06-03 DIAGNOSIS — Z791 Long term (current) use of non-steroidal anti-inflammatories (NSAID): Secondary | ICD-10-CM | POA: Diagnosis not present

## 2018-06-03 DIAGNOSIS — E669 Obesity, unspecified: Secondary | ICD-10-CM | POA: Diagnosis not present

## 2018-06-03 LAB — CBC WITH DIFFERENTIAL/PLATELET
Abs Immature Granulocytes: 0 10*3/uL (ref 0.00–0.07)
Basophils Absolute: 0.1 10*3/uL (ref 0.0–0.1)
Basophils Relative: 2 %
Eosinophils Absolute: 0.1 10*3/uL (ref 0.0–0.5)
Eosinophils Relative: 4 %
HCT: 42 % (ref 36.0–46.0)
Hemoglobin: 13.2 g/dL (ref 12.0–15.0)
Immature Granulocytes: 0 %
Lymphocytes Relative: 39 %
Lymphs Abs: 1.3 10*3/uL (ref 0.7–4.0)
MCH: 29.1 pg (ref 26.0–34.0)
MCHC: 31.4 g/dL (ref 30.0–36.0)
MCV: 92.5 fL (ref 80.0–100.0)
Monocytes Absolute: 0.4 10*3/uL (ref 0.1–1.0)
Monocytes Relative: 13 %
Neutro Abs: 1.5 10*3/uL — ABNORMAL LOW (ref 1.7–7.7)
Neutrophils Relative %: 42 %
Platelets: 220 10*3/uL (ref 150–400)
RBC: 4.54 MIL/uL (ref 3.87–5.11)
RDW: 12.9 % (ref 11.5–15.5)
WBC: 3.5 10*3/uL — AB (ref 4.0–10.5)
nRBC: 0 % (ref 0.0–0.2)

## 2018-06-03 LAB — IRON AND TIBC
IRON: 99 ug/dL (ref 28–170)
Saturation Ratios: 25 % (ref 10.4–31.8)
TIBC: 396 ug/dL (ref 250–450)
UIBC: 297 ug/dL

## 2018-06-03 LAB — FERRITIN: Ferritin: 18 ng/mL (ref 11–307)

## 2018-06-03 LAB — LACTATE DEHYDROGENASE: LDH: 124 U/L (ref 98–192)

## 2018-06-03 NOTE — Progress Notes (Signed)
Patient is here today to establish care for her low white cell count-Leukopenia. Patient denied fever, chills, nausea, fever, diarrhea or constipation.

## 2018-06-04 LAB — PROTEIN ELECTROPHORESIS, SERUM
A/G Ratio: 1.9 — ABNORMAL HIGH (ref 0.7–1.7)
ALBUMIN ELP: 4.1 g/dL (ref 2.9–4.4)
Alpha-1-Globulin: 0.2 g/dL (ref 0.0–0.4)
Alpha-2-Globulin: 0.6 g/dL (ref 0.4–1.0)
Beta Globulin: 1 g/dL (ref 0.7–1.3)
Gamma Globulin: 0.5 g/dL (ref 0.4–1.8)
Globulin, Total: 2.2 g/dL (ref 2.2–3.9)
Total Protein ELP: 6.3 g/dL (ref 6.0–8.5)

## 2018-06-07 LAB — COMP PANEL: LEUKEMIA/LYMPHOMA

## 2018-06-09 LAB — NEUTROPHIL AB TEST LEVEL 1: NEUTROPHIL SCR/PANEL INTERP.: NEGATIVE

## 2018-06-16 ENCOUNTER — Other Ambulatory Visit: Payer: Self-pay | Admitting: Internal Medicine

## 2018-06-16 DIAGNOSIS — Z1231 Encounter for screening mammogram for malignant neoplasm of breast: Secondary | ICD-10-CM

## 2018-08-02 ENCOUNTER — Other Ambulatory Visit: Payer: Self-pay

## 2018-08-02 ENCOUNTER — Other Ambulatory Visit: Payer: Self-pay | Admitting: Internal Medicine

## 2018-08-02 ENCOUNTER — Ambulatory Visit
Admission: RE | Admit: 2018-08-02 | Discharge: 2018-08-02 | Disposition: A | Discharge status: Home / Self Care | Payer: Medicare Other | Source: Ambulatory Visit | Attending: Internal Medicine | Admitting: Internal Medicine

## 2018-08-02 DIAGNOSIS — Z1231 Encounter for screening mammogram for malignant neoplasm of breast: Secondary | ICD-10-CM | POA: Diagnosis present

## 2018-08-02 DIAGNOSIS — R928 Other abnormal and inconclusive findings on diagnostic imaging of breast: Secondary | ICD-10-CM

## 2018-08-02 DIAGNOSIS — N6320 Unspecified lump in the left breast, unspecified quadrant: Secondary | ICD-10-CM

## 2018-08-06 ENCOUNTER — Ambulatory Visit
Admission: RE | Admit: 2018-08-06 | Discharge: 2018-08-06 | Disposition: A | Discharge status: Home / Self Care | Payer: Medicare Other | Source: Ambulatory Visit | Attending: Internal Medicine | Admitting: Internal Medicine

## 2018-08-06 ENCOUNTER — Other Ambulatory Visit: Payer: Self-pay

## 2018-08-06 DIAGNOSIS — R928 Other abnormal and inconclusive findings on diagnostic imaging of breast: Secondary | ICD-10-CM

## 2018-08-06 DIAGNOSIS — N6320 Unspecified lump in the left breast, unspecified quadrant: Secondary | ICD-10-CM

## 2018-09-02 ENCOUNTER — Telehealth: Payer: Self-pay | Admitting: Oncology

## 2018-09-02 NOTE — Telephone Encounter (Signed)
2nd message left for pt to contact office for appt to be moved to WebEx . No return call from patient. Pt scheduled for 4/23.

## 2018-09-09 ENCOUNTER — Other Ambulatory Visit: Payer: Medicare Other

## 2018-09-09 ENCOUNTER — Ambulatory Visit: Payer: Medicare Other | Admitting: Oncology

## 2018-09-21 ENCOUNTER — Other Ambulatory Visit: Payer: Self-pay | Admitting: Internal Medicine

## 2018-09-21 DIAGNOSIS — I471 Supraventricular tachycardia: Secondary | ICD-10-CM

## 2018-09-21 MED ORDER — METOPROLOL TARTRATE 25 MG PO TABS: ORAL_TABLET | ORAL | 3 refills | Status: DC

## 2018-09-28 ENCOUNTER — Ambulatory Visit (INDEPENDENT_AMBULATORY_CARE_PROVIDER_SITE_OTHER): Payer: Medicare Other | Admitting: Internal Medicine

## 2018-09-28 ENCOUNTER — Other Ambulatory Visit: Payer: Self-pay

## 2018-09-28 ENCOUNTER — Ambulatory Visit (INDEPENDENT_AMBULATORY_CARE_PROVIDER_SITE_OTHER): Payer: Medicare Other

## 2018-09-28 DIAGNOSIS — Z0000 Encounter for general adult medical examination without abnormal findings: Secondary | ICD-10-CM | POA: Diagnosis not present

## 2018-09-28 DIAGNOSIS — E785 Hyperlipidemia, unspecified: Secondary | ICD-10-CM | POA: Diagnosis not present

## 2018-09-28 DIAGNOSIS — R232 Flushing: Secondary | ICD-10-CM | POA: Diagnosis not present

## 2018-09-28 DIAGNOSIS — D72819 Decreased white blood cell count, unspecified: Secondary | ICD-10-CM | POA: Diagnosis not present

## 2018-09-28 DIAGNOSIS — R7303 Prediabetes: Secondary | ICD-10-CM

## 2018-09-28 NOTE — Patient Instructions (Addendum)
  Ms. Eggert , Thank you for taking time to come for your Medicare Wellness Visit. I appreciate your ongoing commitment to your health goals. Please review the following plan we discussed and let me know if I can assist you in the future.   These are the goals we discussed: Goals      Patient Stated   . Increase physical activity (pt-stated)     Increase use of stationary bike 5 days, 20 minutes Continue yard work 2-3 days weekly, 2-3 hours       This is a list of the screening recommended for you and due dates:  Health Maintenance  Topic Date Due  . Tetanus Vaccine  04/02/1967  . Pneumonia vaccines (1 of 2 - PCV13) 04/01/2013  . Flu Shot  12/18/2018  . Mammogram  08/01/2020  . Colon Cancer Screening  03/22/2028  . DEXA scan (bone density measurement)  Completed  .  Hepatitis C: One time screening is recommended by Center for Disease Control  (CDC) for  adults born from [birth year] through 1965.   Completed

## 2018-09-28 NOTE — Patient Instructions (Addendum)
Menopause  Menopause is the normal time of life when menstrual periods stop completely. It is usually confirmed by 12 months without a menstrual period. The transition to menopause (perimenopause) most often happens between the ages of 45 and 55. During perimenopause, hormone levels change in your body, which can cause symptoms and affect your health. Menopause may increase your risk for:   Loss of bone (osteoporosis), which causes bone breaks (fractures).   Depression.   Hardening and narrowing of the arteries (atherosclerosis), which can cause heart attacks and strokes.  What are the causes?  This condition is usually caused by a natural change in hormone levels that happens as you get older. The condition may also be caused by surgery to remove both ovaries (bilateral oophorectomy).  What increases the risk?  This condition is more likely to start at an earlier age if you have certain medical conditions or treatments, including:   A tumor of the pituitary gland in the brain.   A disease that affects the ovaries and hormone production.   Radiation treatment for cancer.   Certain cancer treatments, such as chemotherapy or hormone (anti-estrogen) therapy.   Heavy smoking and excessive alcohol use.   Family history of early menopause.  This condition is also more likely to develop earlier in women who are very thin.  What are the signs or symptoms?  Symptoms of this condition include:   Hot flashes.   Irregular menstrual periods.   Night sweats.   Changes in feelings about sex. This could be a decrease in sex drive or an increased comfort around your sexuality.   Vaginal dryness and thinning of the vaginal walls. This may cause painful intercourse.   Dryness of the skin and development of wrinkles.   Headaches.   Problems sleeping (insomnia).   Mood swings or irritability.   Memory problems.   Weight gain.   Hair growth on the face and chest.   Bladder infections or problems with urinating.  How  is this diagnosed?  This condition is diagnosed based on your medical history, a physical exam, your age, your menstrual history, and your symptoms. Hormone tests may also be done.  How is this treated?  In some cases, no treatment is needed. You and your health care provider should make a decision together about whether treatment is necessary. Treatment will be based on your individual condition and preferences. Treatment for this condition focuses on managing symptoms. Treatment may include:   Menopausal hormone therapy (MHT).   Medicines to treat specific symptoms or complications.   Acupuncture.   Vitamin or herbal supplements.  Before starting treatment, make sure to let your health care provider know if you have a personal or family history of:   Heart disease.   Breast cancer.   Blood clots.   Diabetes.   Osteoporosis.  Follow these instructions at home:  Lifestyle   Do not use any products that contain nicotine or tobacco, such as cigarettes and e-cigarettes. If you need help quitting, ask your health care provider.   Get at least 30 minutes of physical activity on 5 or more days each week.   Avoid alcoholic and caffeinated beverages, as well as spicy foods. This may help prevent hot flashes.   Get 7-8 hours of sleep each night.   If you have hot flashes, try:  ? Dressing in layers.  ? Avoiding things that may trigger hot flashes, such as spicy food, warm places, or stress.  ? Taking slow, deep   breaths when a hot flash starts.  ? Keeping a fan in your home and office.   Find ways to manage stress, such as deep breathing, meditation, or journaling.   Consider going to group therapy with other women who are having menopause symptoms. Ask your health care provider about recommended group therapy meetings.  Eating and drinking   Eat a healthy, balanced diet that contains whole grains, lean protein, low-fat dairy, and plenty of fruits and vegetables.   Your health care provider may recommend  adding more soy to your diet. Foods that contain soy include tofu, tempeh, and soy milk.   Eat plenty of foods that contain calcium and vitamin D for bone health. Items that are rich in calcium include low-fat milk, yogurt, beans, almonds, sardines, broccoli, and kale.  Medicines   Take over-the-counter and prescription medicines only as told by your health care provider.   Talk with your health care provider before starting any herbal supplements. If prescribed, take vitamins and supplements as told by your health care provider. These may include:  ? Calcium. Women age 51 and older should get 1,200 mg (milligrams) of calcium every day.  ? Vitamin D. Women need 600-800 International Units of vitamin D each day.  ? Vitamins B12 and B6. Aim for 50 micrograms of B12 and 1.5 mg of B6 each day.  General instructions   Keep track of your menstrual periods, including:  ? When they occur.  ? How heavy they are and how long they last.  ? How much time passes between periods.   Keep track of your symptoms, noting when they start, how often you have them, and how long they last.   Use vaginal lubricants or moisturizers to help with vaginal dryness and improve comfort during sex.   Keep all follow-up visits as told by your health care provider. This is important. This includes any group therapy or counseling.  Contact a health care provider if:   You are still having menstrual periods after age 55.   You have pain during sex.   You have not had a period for 12 months and you develop vaginal bleeding.  Get help right away if:   You have:  ? Severe depression.  ? Excessive vaginal bleeding.  ? Pain when you urinate.  ? A fast or irregular heart beat (palpitations).  ? Severe headaches.  ? Abdomen (abdominal) pain or severe indigestion.   You fell and you think you have a broken bone.   You develop leg or chest pain.   You develop vision problems.   You feel a lump in your breast.  Summary   Menopause is the normal  time of life when menstrual periods stop completely. It is usually confirmed by 12 months without a menstrual period.   The transition to menopause (perimenopause) most often happens between the ages of 45 and 55.   Symptoms can be managed through medicines, lifestyle changes, and complementary therapies such as acupuncture.   Eat a balanced diet that is rich in nutrients to promote bone health and heart health and to manage symptoms during menopause.  This information is not intended to replace advice given to you by your health care provider. Make sure you discuss any questions you have with your health care provider.  Document Released: 07/26/2003 Document Revised: 06/07/2016 Document Reviewed: 06/07/2016  Elsevier Interactive Patient Education  2019 [employer] Inc[employer]

## 2018-09-28 NOTE — Progress Notes (Signed)
Subjective:   Hailyn SHEA SWALLEY is a 73 y.o. female who presents for Medicare Annual (Subsequent) preventive examination.  Review of Systems:  No ROS.  Medicare Wellness Virtual Visit.  Visual/audio telehealth visit, UTA vital signs.   See social history for additional risk factors.   Cardiac Risk Factors include: advanced age (>65men, >52 women)     Objective:     Vitals: There were no vitals taken for this visit.  There is no height or weight on file to calculate BMI.  Advanced Directives 09/28/2018 06/03/2018 03/22/2018 10/30/2017 10/20/2017 09/24/2017 06/06/2017  Does Patient Have a Medical Advance Directive? Yes Yes Yes Yes Yes Yes Yes  Type of Estate agent of [place];Living will Healthcare Power of [place];Living will - Healthcare Power of [place];Living will Healthcare Power of [place];Living will Healthcare Power of [place];Living will Healthcare Power of [place];Living will  Does patient want to make changes to medical advance directive? No - Patient declined No - Patient declined - No - Patient declined - No - Patient declined No - Patient declined  Copy of Healthcare Power of Attorney in Chart? Yes - validated most recent copy scanned in chart (See row information) Yes - validated most recent copy scanned in chart (See row information) - Yes No - copy requested No - copy requested No - copy requested  Would patient like information on creating a medical advance directive? - No - Patient declined - - - - -    Tobacco Social History   Tobacco Use  Smoking Status Never Smoker  Smokeless Tobacco Never Used     Counseling given: Not Answered   Clinical Intake:  Pre-visit preparation completed: Yes        Diabetes: No  How often do you need to have someone help you when you read instructions, pamphlets, or other written materials from your doctor or pharmacy?: 1 - Never  Interpreter Needed?: No     Past Medical History:  Diagnosis Date  .  Arthritis   . Atypical chest pain    a. 04/2015 Myoview: EF 78%, breast attenuation, no ischemia-->Low risk.  . Back pain    scoliosis  . Chicken pox   . Dysrhythmia    hx palpitations  . GERD (gastroesophageal reflux disease)   . History of hiatal hernia    noted on cxr  . Hyperlipidemia   . Joint pain   . Obesity, unspecified   . Paroxysmal SVT (supraventricular tachycardia) (HCC)    a. 2013 Holter: PACs/PVCs; b. 10/2011 Ehco: EF nl, no rwma, mild LVH, mild TR, PASP ; c 04/2015 SVT in ED->resolved with adenosine; c.   . Pneumonia 2010   hx of  . Scoliosis    multiple areas of back follows with Dr. Dyke Maes chiropractor    Past Surgical History:  Procedure Laterality Date  . ABDOMINAL HYSTERECTOMY     1990 ovaries intact. had 2/2 fibroids last pap 2004 neg.  ? if cervix present   . BACK SURGERY  1960's   d/t scoliosis-1964/65?  Marland Kitchen BACK SURGERY     x 3   . BREAST BIOPSY Left 2008   CORE W/CLIP - NEG  . BREAST BIOPSY     2000   . BREAST EXCISIONAL BIOPSY Right 20 + yrs ago  . BREAST SURGERY  1990   excision breast mass  . COLONOSCOPY  2007   Dr. Mechele Collin  . COLONOSCOPY WITH ESOPHAGOGASTRODUODENOSCOPY (EGD) AND ESOPHAGEAL DILATION (ED)    . COLONOSCOPY WITH PROPOFOL  N/A 03/22/2018   Procedure: COLONOSCOPY WITH PROPOFOL;  Surgeon: Scot Jun, MD;  Location: [facility] ENDOSCOPY;  Service: Endoscopy;  Laterality: N/A;  . ESOPHAGOGASTRODUODENOSCOPY (EGD) WITH PROPOFOL N/A 03/22/2018   Procedure: ESOPHAGOGASTRODUODENOSCOPY (EGD) WITH PROPOFOL;  Surgeon: Scot Jun, MD;  Location: [facility] ENDOSCOPY;  Service: Endoscopy;  Laterality: N/A;  . EYE SURGERY  2007   cataracts  . FOOT SURGERY  1960's  . JOINT REPLACEMENT     total hip left with metal hardward 2016 Dr. Alphonsa Gin ortho   . OTHER SURGICAL HISTORY  2004   Bilateral cataracts  . PARTIAL HYSTERECTOMY  1990  . REVISION TOTAL HIP ARTHROPLASTY Left 04/17/2014   DR Turner Daniels  . TOTAL HIP ARTHROPLASTY  2006  .  TOTAL HIP REVISION Left 04/17/2014   Procedure: LEFT TOTAL HIP REVISION;  Surgeon: Nestor Lewandowsky, MD;  Location: [facility] OR;  Service: Orthopedics;  Laterality: Left;  . TOTAL KNEE ARTHROPLASTY Right 10/30/2017   Procedure: RIGHT TOTAL KNEE ARTHROPLASTY;  Surgeon: Gean Birchwood, MD;  Location: [facility] OR;  Service: Orthopedics;  Laterality: Right;   Family History  Problem Relation Age of Onset  . Heart attack Father   . Heart disease Father        MI  . Diabetes Father   . Hypertension Father   . Early death Father   . Cancer Mother        breast cancer  . Breast cancer Mother 26  . Arthritis Mother   . Dementia Mother   . Aneurysm Brother        cardiac   . Early death Brother   . Colon cancer Cousin    Social History   Socioeconomic History  . Marital status: Married    Spouse name: Not on file  . Number of children: Not on file  . Years of education: Not on file  . Highest education level: Not on file  Occupational History  . Not on file  Social Needs  . Financial resource strain: Not hard at all  . Food insecurity:    Worry: Never true    Inability: Never true  . Transportation needs:    Medical: Not on file    Non-medical: Not on file  Tobacco Use  . Smoking status: Never Smoker  . Smokeless tobacco: Never Used  Substance and Sexual Activity  . Alcohol use: Yes    Comment: rare  . Drug use: No  . Sexual activity: Yes    Birth control/protection: Surgical  Lifestyle  . Physical activity:    Days per week: Not on file    Minutes per session: Not on file  . Stress: Not at all  Relationships  . Social connections:    Talks on phone: Not on file    Gets together: Not on file    Attends religious service: Not on file    Active member of club or organization: Not on file    Attends meetings of clubs or organizations: Not on file    Relationship status: Not on file  Other Topics Concern  . 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 aspirin EC 81 MG tablet Take 1 tablet (81 mg total) by mouth 2 (two) times daily. (Patient taking differently: Take 81 mg  by mouth daily. )  . diphenhydrAMINE (BENADRYL) 25 MG tablet Take 25 mg by mouth every 6 (six) hours as needed for itching.  . meloxicam (MOBIC) 15 MG tablet Take 15 mg by mouth daily.  . metoprolol tartrate (LOPRESSOR) 25 MG tablet Daily prn  . omeprazole (PRILOSEC) 40 MG capsule Take 40 mg by mouth daily.   No facility-administered encounter medications on file as of 09/28/2018.     Activities of Daily Living In your present state of health, do you have any difficulty performing the following activities: 09/28/2018 10/30/2017  Hearing? N N  Vision? N N  Difficulty concentrating or making decisions? N N  Walking or climbing stairs? N Y  Dressing or bathing? N Y  Doing errands, shopping? N Y  Quarry manager and eating ? N -  Using the Toilet? N -  In the past six months, have you accidently leaked urine? N -  Do you have problems with loss of bowel control? N -  Managing your Medications? N -  Managing your Finances? N -  Housekeeping or managing your Housekeeping? N -  Some recent data might be hidden    Patient Care Team: McLean-Scocuzza, Pasty Spillers, MD as PCP - General (Internal Medicine) Iran Ouch, MD as PCP - Cardiology (Cardiology) Kieth Brightly, MD (General Surgery)    Assessment:   This is a routine wellness examination for Meloni.  I connected with patient 09/28/18 at  3:00 PM EDT by a video/audio enabled telemedicine application and verified that I am speaking with the correct person using two identifiers. Patient stated full name and DOB. Patient gave permission to continue with virtual visit. Patient's location was at home and Nurse's location was at [place] office.   Health Screenings   Mammogram - 07/2018 Colonoscopy - 03/2018 Bone Density - 05/2015 Glaucoma -none Hearing -demonstrates normal hearing during visit. Labs followed by pcp Dental- [facility]- last visit 3-4 years ago.  She plans to schedule an exam with her ophthalmologist.   Social  Alcohol intake - yes, rare     Smoking history- never    Smokers in home? none Illicit drug use? none Exercise - yard work 2-3 days weekly up to 90 minutes Diet - low fats/carbs and limited dairy Sexually Active -yes BMI- discussed the importance of a healthy diet, water intake and the benefits of aerobic exercise.  Educational material provided.   Safety  Patient feels safe at home- yes Patient does have smoke detectors at home- yes Patient does wear sunscreen or protective clothing when in direct sunlight -yes Patient does wear seat belt when in a moving vehicle -yes  Covid-19 precautions and sickness symptoms discussed.   Activities of Daily Living Patient denies needing assistance with: driving, household chores, feeding themselves, getting from bed to chair, getting to the toilet, bathing/showering, dressing, managing money, or preparing meals.  No new identified risk were noted.    Depression Screen Patient denies losing interest in daily life, feeling hopeless, or crying easily over simple problems.   Medication-taking as directed and without issues.   Fall Screen Patient denies being afraid of falling or falling in the last year.   Memory Screen Patient is alert.  Patient denies difficulty focusing, concentrating or misplacing items. Correctly identified the president of the [place], season and recall. Patient likes to read, needle work and watch targeted television for brain stimulation.  Immunizations The following Immunizations were discussed: Influenza, shingles, pneumonia, and tetanus.   Other Providers Patient Care  Team: McLean-Scocuzza, Pasty Spillersracy N, MD as PCP - General (Internal Medicine) Iran OuchArida,  Muhammad A, MD as PCP - Cardiology (Cardiology) Kieth BrightlySankar, Seeplaputhur G, MD (General Surgery)  Exercise Activities and Dietary recommendations    Goals      Patient Stated   . Increase physical activity (pt-stated)     Increase use of stationary bike 5 days, 20 minutes Continue yard work 2-3 days weekly, 2-3 hours       Fall Risk Fall Risk  09/28/2018 01/19/2018 09/18/2017 06/24/2017  Falls in the past year? 0 No No No   Depression Screen PHQ 2/9 Scores 09/28/2018 01/19/2018 09/18/2017 06/24/2017  PHQ - 2 Score 0 0 0 0     Cognitive Function MMSE - Mini Mental State Exam 09/24/2017  Orientation to time 5  Orientation to Place 5  Registration 3  Attention/ Calculation 5  Recall 3  Language- name 2 objects 2  Language- repeat 1  Language- follow 3 step command 3  Language- read & follow direction 1  Write a sentence 1  Copy design 1  Total score 30     6CIT Screen 09/28/2018  What Year? 0 points  What month? 0 points  What time? 0 points  Count back from 20 0 points  Months in reverse 0 points  Repeat phrase 0 points  Total Score 0     There is no immunization history on file for this patient.  Screening Tests Health Maintenance  Topic Date Due  . TETANUS/TDAP  04/02/1967  . PNA vac Low Risk Adult (1 of 2 - PCV13) 04/01/2013  . INFLUENZA VACCINE  12/18/2018  . MAMMOGRAM  08/01/2020  . COLONOSCOPY  03/22/2028  . DEXA SCAN  Completed  . Hepatitis C Screening  Completed       Plan:    End of life planning; Advance aging; Advanced directives discussed.  Copy of current HCPOA/Living Will on file.    I have personally reviewed and noted the following in the patient's chart:   . Medical and social history . Use of alcohol, tobacco or illicit drugs  . Current medications and supplements . Functional ability and status . Nutritional status . Physical activity . Advanced directives . List of other physicians . Hospitalizations, surgeries, and ER visits in previous 12  months . Vitals . Screenings to include cognitive, depression, and falls . Referrals and appointments  In addition, I have reviewed and discussed with patient certain preventive protocols, quality metrics, and best practice recommendations. A written personalized care plan for preventive services as well as general preventive health recommendations were provided to patient.     Ashok PallOBrien-Blaney, Arantxa Piercey L, LPN  4/40/10275/04/2019

## 2018-09-28 NOTE — Progress Notes (Signed)
Pre visit review using our clinic review tool, if applicable. No additional management support is needed unless otherwise documented below in the visit note. 

## 2018-09-28 NOTE — Progress Notes (Signed)
Virtual Visit via Video Note  I connected with Kristy Mejia  on 09/28/18 at  2:44 PM EDT by a video enabled telemedicine application and verified that I am speaking with the correct person using two identifiers.  Location patient: home Location provider:work  Persons participating in the virtual visit: patient, provider  I discussed the limitations of evaluation and management by telemedicine and the availability of in person appointments. The patient expressed understanding and agreed to proceed.   HPI: 1. Hot flashes and night weeks x years off premarin x 8-10 years b/c was on it long term and tried supplements with chiropractor which did not help reviewed options gabapentin, clonidine, effexor pt does not want to try due to side effects  2. Blood pressure not checking, no palpitations w/in 6 months when was having them lasted 5-50 minutes and felt flutter and lightheaded now gone overall but overall had these feelings 2-3 x over the last 6 months and had to take metoprolol 1x cardiology appt sch 12/2018  3. HLD declines statin and wants to increase physical activity  4. Low WBC appt with H?o 09/2018    ROS: See pertinent positives and negatives per HPI.  Past Medical History:  Diagnosis Date  . Arthritis   . Atypical chest pain    a. 04/2015 Myoview: EF 78%, breast attenuation, no ischemia-->Low risk.  . Back pain    scoliosis  . Chicken pox   . Dysrhythmia    hx palpitations  . GERD (gastroesophageal reflux disease)   . History of hiatal hernia    noted on cxr  . Hyperlipidemia   . Joint pain   . Obesity, unspecified   . Paroxysmal SVT (supraventricular tachycardia) ([place])    a. 2013 Holter: PACs/PVCs; b. 10/2011 Ehco: EF nl, no rwma, mild LVH, mild TR, PASP 37mHg; c 04/2015 SVT in ED->resolved with adenosine; c.   . Pneumonia 2010   hx of  . Scoliosis    multiple areas of back follows with Dr. KBoston Servicechiropractor     Past Surgical History:  Procedure Laterality Date   . ABDOMINAL HYSTERECTOMY     1990 ovaries intact. had 2/2 fibroids last pap 2004 neg.  ? if cervix present   . BACK SURGERY  1960's   d/t scoliosis-1964/65?  .Marland KitchenBACK SURGERY     x 3   . BREAST BIOPSY Left 2008   CORE W/CLIP - NEG  . BREAST BIOPSY     2000   . BREAST EXCISIONAL BIOPSY Right 20 + yrs ago  . BREAST SURGERY  1990   excision breast mass  . COLONOSCOPY  2007   Dr. EVira Agar . COLONOSCOPY WITH ESOPHAGOGASTRODUODENOSCOPY (EGD) AND ESOPHAGEAL DILATION (ED)    . COLONOSCOPY WITH PROPOFOL N/A 03/22/2018   Procedure: COLONOSCOPY WITH PROPOFOL;  Surgeon: EManya Silvas MD;  [facility];  Service: Endoscopy;  Laterality: N/A;  . ESOPHAGOGASTRODUODENOSCOPY (EGD) WITH PROPOFOL N/A 03/22/2018   Procedure: ESOPHAGOGASTRODUODENOSCOPY (EGD) WITH PROPOFOL;  Surgeon: EManya Silvas MD;  [facility];  Service: Endoscopy;  Laterality: N/A;  . EYE SURGERY  2007   cataracts  . FOOT SURGERY  1960's  . JOINT REPLACEMENT     total hip left with metal hardward 2016 Dr. RJudd Lienortho   . OTHER SURGICAL HISTORY  2004   Bilateral cataracts  . PARTIAL HYSTERECTOMY  1990  . REVISION TOTAL HIP ARTHROPLASTY Left 04/17/2014   DR RMayer Camel . TOTAL HIP ARTHROPLASTY  2006  .  TOTAL HIP REVISION Left 04/17/2014   Procedure: LEFT TOTAL HIP REVISION;  Surgeon: Kerin Salen, MD;  Location: [place];  Service: Orthopedics;  Laterality: Left;  . TOTAL KNEE ARTHROPLASTY Right 10/30/2017   Procedure: RIGHT TOTAL KNEE ARTHROPLASTY;  Surgeon: Frederik Pear, MD;  Location: [place];  Service: Orthopedics;  Laterality: Right;    Family History  Problem Relation Age of Onset  . Heart attack Father   . Heart disease Father        MI  . Diabetes Father   . Hypertension Father   . Early death Father   . Cancer Mother        breast cancer  . Breast cancer Mother 3  . Arthritis Mother   . Dementia Mother   . Aneurysm Brother        cardiac   . Early death Brother   . Colon  cancer Cousin     SOCIAL HX: married    Current Outpatient Medications:  .  aspirin EC 81 MG tablet, Take 1 tablet (81 mg total) by mouth 2 (two) times daily. (Patient taking differently: Take 81 mg by mouth daily. ), Disp: 60 tablet, Rfl: 0 .  diphenhydrAMINE (BENADRYL) 25 MG tablet, Take 25 mg by mouth every 6 (six) hours as needed for itching., Disp: , Rfl:  .  meloxicam (MOBIC) 15 MG tablet, Take 15 mg by mouth daily., Disp: , Rfl:  .  metoprolol tartrate (LOPRESSOR) 25 MG tablet, Daily prn, Disp: 90 tablet, Rfl: 3 .  omeprazole (PRILOSEC) 40 MG capsule, Take 40 mg by mouth daily., Disp: , Rfl:   EXAM:  VITALS per patient if applicable:  GENERAL: alert, oriented, appears well and in no acute distress  HEENT: atraumatic, conjunttiva clear, no obvious abnormalities on inspection of external nose and ears  NECK: normal movements of the head and neck  LUNGS: on inspection no signs of respiratory distress, breathing rate appears normal, no obvious gross SOB, gasping or wheezing  CV: no obvious cyanosis  MS: moves all visible extremities without noticeable abnormality  PSYCH/NEURO: pleasant and cooperative, no obvious depression or anxiety, speech and thought processing grossly intact  ASSESSMENT AND PLAN:  Discussed the following assessment and plan:  Hot flashes-given up to date info declines medication for now   Prediabetes  Hyperlipidemia-declines statin for now  Leukopenia, unspecified type-f/u H/o 09/2018   Declines flu shot  Given prevnar rx, Tdap vaccine todayHad not filled yet Disc shingrixprior appt  Consider hep B vaccine not immune Immune MMR Hep C neg  Colonoscopy 2016 Dr. Juanita Craver need to get records. -EGD/colonoscopy 03/22/2018 Dr. Cordelia Pen hemorrhoids f/u in 10 years and EGD moderate HH, duodenitis no bxs, esophagus dilated  mammo repeat 08/06/18 negative  No need paps/p hysterectomy fibroids no h/o abnormal pap DEXA 06/07/15 normal A1C  5.8 05/10/18  Fasting labs 6 months  Never smoker   I discussed the assessment and treatment plan with the patient. The patient was provided an opportunity to ask questions and all were answered. The patient agreed with the plan and demonstrated an understanding of the instructions.   The patient was advised to call back or seek an in-person evaluation if the symptoms worsen or if the condition fails to improve as anticipated.  Time spent 15 minutes Delorise Jackson, MD

## 2018-09-28 NOTE — Progress Notes (Signed)
Agree with below   TMS 

## 2018-10-06 ENCOUNTER — Other Ambulatory Visit: Payer: Self-pay

## 2018-10-07 ENCOUNTER — Inpatient Hospital Stay: Payer: Medicare Other | Attending: Oncology

## 2018-10-07 ENCOUNTER — Other Ambulatory Visit: Payer: Self-pay

## 2018-10-07 DIAGNOSIS — E785 Hyperlipidemia, unspecified: Secondary | ICD-10-CM | POA: Insufficient documentation

## 2018-10-07 DIAGNOSIS — Z791 Long term (current) use of non-steroidal anti-inflammatories (NSAID): Secondary | ICD-10-CM | POA: Insufficient documentation

## 2018-10-07 DIAGNOSIS — Z79899 Other long term (current) drug therapy: Secondary | ICD-10-CM | POA: Diagnosis not present

## 2018-10-07 DIAGNOSIS — K219 Gastro-esophageal reflux disease without esophagitis: Secondary | ICD-10-CM | POA: Insufficient documentation

## 2018-10-07 DIAGNOSIS — I471 Supraventricular tachycardia: Secondary | ICD-10-CM | POA: Diagnosis not present

## 2018-10-07 DIAGNOSIS — D72819 Decreased white blood cell count, unspecified: Secondary | ICD-10-CM

## 2018-10-07 DIAGNOSIS — Z7982 Long term (current) use of aspirin: Secondary | ICD-10-CM | POA: Insufficient documentation

## 2018-10-07 DIAGNOSIS — M1990 Unspecified osteoarthritis, unspecified site: Secondary | ICD-10-CM | POA: Insufficient documentation

## 2018-10-07 DIAGNOSIS — E669 Obesity, unspecified: Secondary | ICD-10-CM | POA: Diagnosis not present

## 2018-10-07 LAB — CBC WITH DIFFERENTIAL/PLATELET
Abs Immature Granulocytes: 0.01 10*3/uL (ref 0.00–0.07)
Basophils Absolute: 0.1 10*3/uL (ref 0.0–0.1)
Basophils Relative: 2 %
Eosinophils Absolute: 0.2 10*3/uL (ref 0.0–0.5)
Eosinophils Relative: 4 %
HCT: 43.4 % (ref 36.0–46.0)
Hemoglobin: 14 g/dL (ref 12.0–15.0)
Immature Granulocytes: 0 %
Lymphocytes Relative: 38 %
Lymphs Abs: 1.4 10*3/uL (ref 0.7–4.0)
MCH: 29.7 pg (ref 26.0–34.0)
MCHC: 32.3 g/dL (ref 30.0–36.0)
MCV: 92.1 fL (ref 80.0–100.0)
Monocytes Absolute: 0.4 10*3/uL (ref 0.1–1.0)
Monocytes Relative: 12 %
Neutro Abs: 1.5 10*3/uL — ABNORMAL LOW (ref 1.7–7.7)
Neutrophils Relative %: 44 %
Platelets: 249 10*3/uL (ref 150–400)
RBC: 4.71 MIL/uL (ref 3.87–5.11)
RDW: 13.3 % (ref 11.5–15.5)
WBC: 3.5 10*3/uL — ABNORMAL LOW (ref 4.0–10.5)
nRBC: 0 % (ref 0.0–0.2)

## 2018-10-07 LAB — COMPREHENSIVE METABOLIC PANEL
ALT: 16 U/L (ref 0–44)
AST: 19 U/L (ref 15–41)
Albumin: 3.8 g/dL (ref 3.5–5.0)
Alkaline Phosphatase: 91 U/L (ref 38–126)
Anion gap: 5 (ref 5–15)
BUN: 24 mg/dL — ABNORMAL HIGH (ref 8–23)
CO2: 28 mmol/L (ref 22–32)
Calcium: 9.1 mg/dL (ref 8.9–10.3)
Chloride: 108 mmol/L (ref 98–111)
Creatinine, Ser: 0.64 mg/dL (ref 0.44–1.00)
GFR calc Af Amer: >60 mL/min (ref >60)
GFR calc non Af Amer: >60 mL/min (ref >60)
Glucose, Bld: 100 mg/dL — ABNORMAL HIGH (ref 70–99)
Potassium: 4.3 mmol/L (ref 3.5–5.1)
Sodium: 141 mmol/L (ref 135–145)
Total Bilirubin: 0.7 mg/dL (ref 0.3–1.2)
Total Protein: 6.8 g/dL (ref 6.5–8.1)

## 2018-10-07 LAB — IRON AND TIBC
Iron: 56 ug/dL (ref 28–170)
Saturation Ratios: 14 % (ref 10.4–31.8)
TIBC: 399 ug/dL (ref 250–450)
UIBC: 343 ug/dL

## 2018-10-07 LAB — FERRITIN: Ferritin: 13 ng/mL (ref 11–307)

## 2018-10-07 NOTE — Progress Notes (Signed)
[facility]  Telephone:(336) [phone] Fax:(336) [phone]  ID: Kristy Mejia OB: 15-Jan-1946  MR#: [id number]  KDT#:[id number]  Patient Care Team: McLean-Scocuzza, Nino Glow, MD as PCP - General (Internal Medicine) Wellington Hampshire, MD as PCP - Cardiology (Cardiology) Christene Lye, MD (General Surgery)  I connected with Cheryll Cockayne on 10/11/18 at 10:00 AM EDT by video enabled telemedicine visit and verified that I am speaking with the correct person using two identifiers.   I discussed the limitations, risks, security and privacy concerns of performing an evaluation and management service by telemedicine and the availability of in-person appointments. I also discussed with the patient that there may be a patient responsible charge related to this service. The patient expressed understanding and agreed to proceed.   Other persons participating in the visit and their role in the encounter: Patient, MD  Patient's location: Home Provider's location: Clinic  CHIEF COMPLAINT: Leukopenia  INTERVAL HISTORY: Patient agreed to follow-up via video enabled telemedicine visit to discuss her laboratory work and further evaluation.  She continues to feel well and remains asymptomatic. She denies any repeated fevers or infections.  She has a good appetite and denies weight loss.  She has no neurologic complaints.  She denies any chest pain, shortness of breath, cough, or hemoptysis.  She has no nausea, vomiting, constipation, or diarrhea.  She has no urinary complaints.  Patient feels at her baseline offers no specific complaints today.  REVIEW OF SYSTEMS:   Review of Systems  Constitutional: Negative.  Negative for fever, malaise/fatigue and weight loss.  Respiratory: Negative.  Negative for cough and shortness of breath.   Cardiovascular: Negative.  Negative for chest pain and leg swelling.  Gastrointestinal: Negative.  Negative for abdominal pain.  Genitourinary: Negative.   Negative for dysuria and hematuria.  Musculoskeletal: Negative.  Negative for back pain.  Skin: Negative.  Negative for rash.  Neurological: Negative.  Negative for focal weakness, weakness and headaches.  Psychiatric/Behavioral: Negative.  The patient is not nervous/anxious.     As per HPI. Otherwise, a complete review of systems is negative.  PAST MEDICAL HISTORY: Past Medical History:  Diagnosis Date  . Arthritis   . Atypical chest pain    a. 04/2015 Myoview: EF 78%, breast attenuation, no ischemia-->Low risk.  . Back pain    scoliosis  . Chicken pox   . Dysrhythmia    hx palpitations  . GERD (gastroesophageal reflux disease)   . History of hiatal hernia    noted on cxr  . Hyperlipidemia   . Joint pain   . Obesity, unspecified   . Paroxysmal SVT (supraventricular tachycardia) ([place])    a. 2013 Holter: PACs/PVCs; b. 10/2011 Ehco: EF nl, no rwma, mild LVH, mild TR, PASP 83mHg; c 04/2015 SVT in ED->resolved with adenosine; c.   . Pneumonia 2010   hx of  . Scoliosis    multiple areas of back follows with Dr. KBoston Servicechiropractor     PAST SURGICAL HISTORY: Past Surgical History:  Procedure Laterality Date  . ABDOMINAL HYSTERECTOMY     1990 ovaries intact. had 2/2 fibroids last pap 2004 neg.  ? if cervix present   . BACK SURGERY  1960's   d/t scoliosis-1964/65?  .Marland KitchenBACK SURGERY     x 3   . BREAST BIOPSY Left 2008   CORE W/CLIP - NEG  . BREAST BIOPSY     2000   . BREAST EXCISIONAL BIOPSY Right 20 + yrs ago  .  BREAST SURGERY  1990   excision breast mass  . COLONOSCOPY  2007   Dr. Vira Agar  . COLONOSCOPY WITH ESOPHAGOGASTRODUODENOSCOPY (EGD) AND ESOPHAGEAL DILATION (ED)    . COLONOSCOPY WITH PROPOFOL N/A 03/22/2018   Procedure: COLONOSCOPY WITH PROPOFOL;  Surgeon: Manya Silvas, MD;  [facility];  Service: Endoscopy;  Laterality: N/A;  . ESOPHAGOGASTRODUODENOSCOPY (EGD) WITH PROPOFOL N/A 03/22/2018   Procedure: ESOPHAGOGASTRODUODENOSCOPY (EGD) WITH  PROPOFOL;  Surgeon: Manya Silvas, MD;  [facility];  Service: Endoscopy;  Laterality: N/A;  . EYE SURGERY  2007   cataracts  . FOOT SURGERY  1960's  . JOINT REPLACEMENT     total hip left with metal hardward 2016 Dr. Judd Lien ortho   . OTHER SURGICAL HISTORY  2004   Bilateral cataracts  . PARTIAL HYSTERECTOMY  1990  . REVISION TOTAL HIP ARTHROPLASTY Left 04/17/2014   DR Mayer Camel  . TOTAL HIP ARTHROPLASTY  2006  . TOTAL HIP REVISION Left 04/17/2014   Procedure: LEFT TOTAL HIP REVISION;  Surgeon: Kerin Salen, MD;  Location: [place];  Service: Orthopedics;  Laterality: Left;  . TOTAL KNEE ARTHROPLASTY Right 10/30/2017   Procedure: RIGHT TOTAL KNEE ARTHROPLASTY;  Surgeon: Frederik Pear, MD;  Location: [place];  Service: Orthopedics;  Laterality: Right;    FAMILY HISTORY: Family History  Problem Relation Age of Onset  . Heart attack Father   . Heart disease Father        MI  . Diabetes Father   . Hypertension Father   . Early death Father   . Cancer Mother        breast cancer  . Breast cancer Mother 14  . Arthritis Mother   . Dementia Mother   . Aneurysm Brother        cardiac   . Early death Brother   . Colon cancer Cousin     ADVANCED DIRECTIVES (Y/N):  N  HEALTH MAINTENANCE: Social History   Tobacco Use  . Smoking status: Never Smoker  . Smokeless tobacco: Never Used  Substance Use Topics  . Alcohol use: Yes    Comment: rare  . Drug use: No     Colonoscopy:  PAP:  Bone density:  Lipid panel:  Allergies  Allergen Reactions  . Diltiazem Rash    CD formulation likely generalized drug rash/eruption face, trunk, arms   . Metoprolol Succinate [Metoprolol] Other (See Comments)    Succinate version causes extreme lethargy   . Tape Itching, Rash and Other (See Comments)    Reaction: blisters (adhesive tape) Paper tape is ok    Current Outpatient Medications  Medication Sig Dispense Refill  . aspirin EC 81 MG tablet Take 1 tablet (81 mg  total) by mouth 2 (two) times daily. (Patient taking differently: Take 81 mg by mouth daily. ) 60 tablet 0  . meloxicam (MOBIC) 15 MG tablet Take 15 mg by mouth daily.    . metoprolol tartrate (LOPRESSOR) 25 MG tablet Daily prn 90 tablet 3  . omeprazole (PRILOSEC) 40 MG capsule Take 40 mg by mouth daily.    . diphenhydrAMINE (BENADRYL) 25 MG tablet Take 25 mg by mouth every 6 (six) hours as needed for itching.     No current facility-administered medications for this visit.     OBJECTIVE: There were no vitals filed for this visit.   There is no height or weight on file to calculate BMI.    ECOG FS:0 - Asymptomatic  General: Well-developed, well-nourished, no acute  distress. HEENT: Normocephalic. Neuro: Alert, answering all questions appropriately. Cranial nerves grossly intact. Skin: No rashes or petechiae noted. Psych: Normal affect.  LAB RESULTS:  Lab Results  Component Value Date   NA 141 10/07/2018   K 4.3 10/07/2018   CL 108 10/07/2018   CO2 28 10/07/2018   GLUCOSE 100 (H) 10/07/2018   BUN 24 (H) 10/07/2018   CREATININE 0.64 10/07/2018   CALCIUM 9.1 10/07/2018   PROT 6.8 10/07/2018   ALBUMIN 3.8 10/07/2018   AST 19 10/07/2018   ALT 16 10/07/2018   ALKPHOS 91 10/07/2018   BILITOT 0.7 10/07/2018   GFRNONAA >60 10/07/2018   GFRAA >60 10/07/2018    Lab Results  Component Value Date   WBC 3.5 (L) 10/07/2018   NEUTROABS 1.5 (L) 10/07/2018   HGB 14.0 10/07/2018   HCT 43.4 10/07/2018   MCV 92.1 10/07/2018   PLT 249 10/07/2018     STUDIES: No results found.  ASSESSMENT: Leukopenia  PLAN:  1. Leukopenia: Patient's white blood count remains decreased, but stable at 3.5.  All of her other laboratory work including peripheral blood flow cytometry, SPEP, and antineutrophil antibodies are either negative or within normal limits.  No intervention is needed.  Patient does not require bone marrow biopsy.  After lengthy discussion with the patient, it was agreed upon that  no further follow-up is necessary.  Please monitor patient's CBC 1 or 2 times per year and if she becomes symptomatic or her white blood cell count continues to decline please refer her back for further evaluation.    I provided 15 minutes of face-to-face video visit time during this encounter, and > 50% was spent counseling as documented under my assessment & plan.   Patient expressed understanding and was in agreement with this plan. She also understands that She can call clinic at any time with any questions, concerns, or complaints.    Lloyd Huger, MD   10/11/2018 8:17 AM

## 2018-10-08 ENCOUNTER — Inpatient Hospital Stay (HOSPITAL_BASED_OUTPATIENT_CLINIC_OR_DEPARTMENT_OTHER): Payer: Medicare Other | Admitting: Oncology

## 2018-10-08 ENCOUNTER — Other Ambulatory Visit: Payer: Medicare Other

## 2018-10-08 DIAGNOSIS — D72819 Decreased white blood cell count, unspecified: Secondary | ICD-10-CM

## 2018-10-08 DIAGNOSIS — Z7982 Long term (current) use of aspirin: Secondary | ICD-10-CM

## 2018-10-08 DIAGNOSIS — Z79899 Other long term (current) drug therapy: Secondary | ICD-10-CM

## 2018-10-08 LAB — PROTEIN ELECTROPHORESIS, SERUM
A/G Ratio: 1.2 (ref 0.7–1.7)
Albumin ELP: 3.6 g/dL (ref 2.9–4.4)
Alpha-1-Globulin: 0.2 g/dL (ref 0.0–0.4)
Alpha-2-Globulin: 0.7 g/dL (ref 0.4–1.0)
Beta Globulin: 1.1 g/dL (ref 0.7–1.3)
Gamma Globulin: 0.8 g/dL (ref 0.4–1.8)
Globulin, Total: 2.9 g/dL (ref 2.2–3.9)
Total Protein ELP: 6.5 g/dL (ref 6.0–8.5)

## 2018-10-08 NOTE — Progress Notes (Signed)
Doximity visit scheduled today to review lab results. Patient denies concerns.

## 2018-10-20 ENCOUNTER — Ambulatory Visit: Payer: Medicare Other | Admitting: Podiatry

## 2018-10-20 ENCOUNTER — Ambulatory Visit (INDEPENDENT_AMBULATORY_CARE_PROVIDER_SITE_OTHER): Payer: Medicare Other

## 2018-10-20 ENCOUNTER — Other Ambulatory Visit: Payer: Self-pay

## 2018-10-20 ENCOUNTER — Encounter: Payer: Self-pay | Admitting: Podiatry

## 2018-10-20 VITALS — Temp 98.2°F

## 2018-10-20 DIAGNOSIS — R7309 Other abnormal glucose: Secondary | ICD-10-CM | POA: Insufficient documentation

## 2018-10-20 DIAGNOSIS — M2011 Hallux valgus (acquired), right foot: Secondary | ICD-10-CM | POA: Diagnosis not present

## 2018-10-20 DIAGNOSIS — M2042 Other hammer toe(s) (acquired), left foot: Secondary | ICD-10-CM | POA: Diagnosis not present

## 2018-10-20 DIAGNOSIS — M2041 Other hammer toe(s) (acquired), right foot: Secondary | ICD-10-CM

## 2018-10-20 DIAGNOSIS — N951 Menopausal and female climacteric states: Secondary | ICD-10-CM | POA: Insufficient documentation

## 2018-10-20 DIAGNOSIS — Q828 Other specified congenital malformations of skin: Secondary | ICD-10-CM

## 2018-10-20 DIAGNOSIS — M2012 Hallux valgus (acquired), left foot: Secondary | ICD-10-CM | POA: Diagnosis not present

## 2018-10-20 DIAGNOSIS — M25519 Pain in unspecified shoulder: Secondary | ICD-10-CM | POA: Insufficient documentation

## 2018-10-20 DIAGNOSIS — E669 Obesity, unspecified: Secondary | ICD-10-CM | POA: Insufficient documentation

## 2018-10-20 NOTE — Progress Notes (Signed)
Subjective:  Patient ID: Kristy Mejia, female    DOB: Aug 17, 1945,  MRN: [id number] HPI Chief Complaint  Patient presents with  . Foot Pain    Plantar forefoot and toes bilateral - patient has multiple callused areas and would like to know her options to get rid of them, including surgical, painful with shoe gear  . New Patient (Initial Visit)    Est pt 11/2015    73 y.o. female presents with the above complaint.   ROS: Denies fever chills nausea vomiting muscle aches pains calf pain back pain chest pain shortness of breath.  Past Medical History:  Diagnosis Date  . Arthritis   . Atypical chest pain    a. 04/2015 Myoview: EF 78%, breast attenuation, no ischemia-->Low risk.  . Back pain    scoliosis  . Chicken pox   . Dysrhythmia    hx palpitations  . GERD (gastroesophageal reflux disease)   . History of hiatal hernia    noted on cxr  . Hyperlipidemia   . Joint pain   . Obesity, unspecified   . Paroxysmal SVT (supraventricular tachycardia) ([facility])    a. 2013 Holter: PACs/PVCs; b. 10/2011 Ehco: EF nl, no rwma, mild LVH, mild TR, PASP 34mmHg; c 04/2015 SVT in ED->resolved with adenosine; c.   . Pneumonia 2010   hx of  . Scoliosis    multiple areas of back follows with Dr. Dyke MaesKen Brown chiropractor    Past Surgical History:  Procedure Laterality Date  . ABDOMINAL HYSTERECTOMY     1990 ovaries intact. had 2/2 fibroids last pap 2004 neg.  ? if cervix present   . BACK SURGERY  1960's   d/t scoliosis-1964/65?  Marland Kitchen. BACK SURGERY     x 3   . BREAST BIOPSY Left 2008   CORE W/CLIP - NEG  . BREAST BIOPSY     2000   . BREAST EXCISIONAL BIOPSY Right 20 + yrs ago  . BREAST SURGERY  1990   excision breast mass  . COLONOSCOPY  2007   Dr. Mechele CollinElliott  . COLONOSCOPY WITH ESOPHAGOGASTRODUODENOSCOPY (EGD) AND ESOPHAGEAL DILATION (ED)    . COLONOSCOPY WITH PROPOFOL N/A 03/22/2018   Procedure: COLONOSCOPY WITH PROPOFOL;  Surgeon: Scot JunElliott, Robert T, MD;  [facility];  Service:  Endoscopy;  Laterality: N/A;  . ESOPHAGOGASTRODUODENOSCOPY (EGD) WITH PROPOFOL N/A 03/22/2018   Procedure: ESOPHAGOGASTRODUODENOSCOPY (EGD) WITH PROPOFOL;  Surgeon: Scot JunElliott, Robert T, MD;  [facility];  Service: Endoscopy;  Laterality: N/A;  . EYE SURGERY  2007   cataracts  . FOOT SURGERY  1960's  . JOINT REPLACEMENT     total hip left with metal hardward 2016 Dr. Alphonsa Ginowan [place] ortho   . OTHER SURGICAL HISTORY  2004   Bilateral cataracts  . PARTIAL HYSTERECTOMY  1990  . REVISION TOTAL HIP ARTHROPLASTY Left 04/17/2014   DR Turner DanielsOWAN  . TOTAL HIP ARTHROPLASTY  2006  . TOTAL HIP REVISION Left 04/17/2014   Procedure: LEFT TOTAL HIP REVISION;  Surgeon: Nestor LewandowskyFrank J Rowan, MD;  Location[facility];  Service: Orthopedics;  Laterality: Left;  . TOTAL KNEE ARTHROPLASTY Right 10/30/2017   Procedure: RIGHT TOTAL KNEE ARTHROPLASTY;  Surgeon: Gean Birchwoodowan, Frank, MD;  Location: [facility];  Service: Orthopedics;  Laterality: Right;    Current Outpatient Medications:  .  aspirin EC 81 MG tablet, Take 1 tablet (81 mg total) by mouth 2 (two) times daily. (Patient taking differently: Take 81 mg by mouth daily. ), Disp: 60 tablet, Rfl: 0 .  meloxicam (MOBIC)  15 MG tablet, , Disp: , Rfl:  .  metoprolol tartrate (LOPRESSOR) 25 MG tablet, , Disp: , Rfl:  .  omeprazole (PRILOSEC) 40 MG capsule, , Disp: , Rfl:   Allergies  Allergen Reactions  . Diltiazem Rash    CD formulation likely generalized drug rash/eruption face, trunk, arms   . Metoprolol Succinate [Metoprolol] Other (See Comments)    Succinate version causes extreme lethargy   . Tape Itching, Rash and Other (See Comments)    Reaction: blisters (adhesive tape) Paper tape is ok   Review of Systems Objective:   Vitals:   10/20/18 1042  Temp: 98.2 F (36.8 C)    General: Well developed, nourished, in no acute distress, alert and oriented x3   Dermatological: Skin is warm, dry and supple bilateral. Nails x 10 are well maintained; remaining integument  appears unremarkable at this time. There are no open sores, no preulcerative lesions, no rash or signs of infection present.  Reactive hyperkeratotic tissue dorsal aspect PIPJ second right plantar aspect fifth metatarsal left.  Vascular: Dorsalis Pedis artery and Posterior Tibial artery pedal pulses are 2/4 bilateral with immedate capillary fill time. Pedal hair growth present. No varicosities and no lower extremity edema present bilateral.   Neruologic: Grossly intact via light touch bilateral. Vibratory intact via tuning fork bilateral. Protective threshold with Semmes Wienstein monofilament intact to all pedal sites bilateral. Patellar and Achilles deep tendon reflexes 2+ bilateral. No Babinski or clonus noted bilateral.   Musculoskeletal: No gross boney pedal deformities bilateral. No pain, crepitus, or limitation noted with foot and ankle range of motion bilateral. Muscular strength 5/5 in all groups tested bilateral.  Severe fixed bunion deformity left foot over that of the right foot.  Hammertoe deformities bilateral left greater than right secondary to juxtaposition of the hallux.  Gait: Unassisted, Nonantalgic.    Radiographs:  Radiographs taken today demonstrate severe digital deformities left foot previous bunion repair with the staked hallux and severe digital deformities.  Right foot demonstrates less formed deformity and no previous surgeries.  Osteo-arthritis of the digits and osteopenia of the forefoot.  Assessment & Plan:   Assessment: Severe hallux valgus deformity left severe hammertoe deformities left.  Similar findings right to lesser degree.  Plan: Discussed etiology pathology conservative or surgical therapies at this point I spoke with her about conservative therapies as well as surgical therapies she would like to consider entertaining surgery.  I would like to refer her to Dr. Logan Bores for possible fusion of the first ray and hammertoe repairs to the lesser toes.  I  discussed with this with her in great detail today she understands and is amenable to it I will follow-up with him in the near future.  I debrided all reactive hyperkeratotic tissue placed padding follow-up with her if necessary to discuss with Dr. Logan Bores.     Sharnay Cashion T. [place], [place]

## 2018-10-22 ENCOUNTER — Other Ambulatory Visit: Payer: Self-pay

## 2018-10-22 ENCOUNTER — Encounter: Payer: Self-pay | Admitting: Podiatry

## 2018-10-22 ENCOUNTER — Ambulatory Visit: Payer: Medicare Other | Admitting: Podiatry

## 2018-10-22 VITALS — Temp 96.5°F | Resp 16

## 2018-10-22 DIAGNOSIS — M7742 Metatarsalgia, left foot: Secondary | ICD-10-CM | POA: Diagnosis not present

## 2018-10-22 DIAGNOSIS — M2042 Other hammer toe(s) (acquired), left foot: Secondary | ICD-10-CM

## 2018-10-22 DIAGNOSIS — M21612 Bunion of left foot: Secondary | ICD-10-CM

## 2018-10-22 NOTE — Patient Instructions (Signed)
[employer] on [address].  Extra depth shoes Arch supports/insoles  Dx: metatarsalgia left foot. Hammertoes left foot. Bunion left foot.

## 2018-10-25 NOTE — Progress Notes (Signed)
   Subjective: 73 year old female presenting today for follow up evaluation of left foot pain. She is here to discuss possible surgical intervention of a bunion and hammertoes of the left foot. She states the pain has not gotten any better since her last visit with Dr. Milinda Pointer. She has been using padding for treatment. Walking and wearing certain shoes increases the pain. Patient is here for further evaluation and treatment.   Past Medical History:  Diagnosis Date  . Arthritis   . Atypical chest pain    a. 04/2015 Myoview: EF 78%, breast attenuation, no ischemia-->Low risk.  . Back pain    scoliosis  . Chicken pox   . Dysrhythmia    hx palpitations  . GERD (gastroesophageal reflux disease)   . History of hiatal hernia    noted on cxr  . Hyperlipidemia   . Joint pain   . Obesity, unspecified   . Paroxysmal SVT (supraventricular tachycardia) ([place])    a. 2013 Holter: PACs/PVCs; b. 10/2011 Ehco: EF nl, no rwma, mild LVH, mild TR, PASP 57mmHg; c 04/2015 SVT in ED->resolved with adenosine; c.   . Pneumonia 2010   hx of  . Scoliosis    multiple areas of back follows with Dr. [place] Service chiropractor       Objective: Physical Exam General: The patient is alert and oriented x3 in no acute distress.  Dermatology: Skin is cool, dry and supple bilateral lower extremities. Negative for open lesions or macerations.  Vascular: Palpable pedal pulses bilaterally. No edema or erythema noted. Capillary refill within normal limits.  Neurological: Epicritic and protective threshold grossly intact bilaterally.   Musculoskeletal Exam: Clinical evidence of bunion deformity noted to the respective foot. There is moderate pain on palpation range of motion of the first MPJ. Lateral deviation of the hallux noted consistent with hallux abductovalgus. Hammertoe contracture also noted on clinical exam to digits 2-5 of the left foot. Symptomatic pain on palpation and range of motion also noted to the metatarsal  phalangeal joints of the respective hammertoe digits.   Pain with palpation to the metatarsal heads of the left foot.   Assessment: 1. HAV w/ bunion deformity left 2. Hammertoe deformity digits 2-5 left 3. Metatarsalgia left     Plan of Care:  1. Patient was evaluated. 2. Recommended trying conservative treatment first.  3. Recommended OTC insoles and extra depth shoes from the [employer].  4. Return to clinic in 3 months.    Edrick Kins, DPM [facility]  Dr. Edrick Kins, [place]                                        [place],  [address]                Office [phone]  Fax [phone]

## 2018-11-22 ENCOUNTER — Other Ambulatory Visit: Payer: Medicare Other

## 2018-11-30 ENCOUNTER — Ambulatory Visit: Payer: Medicare Other | Admitting: Internal Medicine

## 2018-12-31 ENCOUNTER — Telehealth: Payer: Self-pay | Admitting: Cardiovascular Disease

## 2018-12-31 NOTE — Telephone Encounter (Signed)
Virtual Visit Pre-Appointment Phone Call  "(Name), I am calling you today to discuss your upcoming appointment. We are currently trying to limit exposure to the virus that causes COVID-19 by seeing patients at home rather than in the office."  1. "What is the BEST phone number to call the day of the visit?" - include this in appointment notes  2. Do you have or have access to (through a family member/friend) a smartphone with video capability that we can use for your visit?" a. If yes - list this number in appt notes as cell (if different from BEST phone #) and list the appointment type as a VIDEO visit in appointment notes b. If no - list the appointment type as a PHONE visit in appointment notes  3. Confirm consent - "In the setting of the current Covid19 crisis, you are scheduled for a (phone or video) visit with your provider on (date) at (time).  Just as we do with many in-office visits, in order for you to participate in this visit, we must obtain consent.  If you'd like, I can send this to your mychart (if signed up) or email for you to review.  Otherwise, I can obtain your verbal consent now.  All virtual visits are billed to your insurance company just like a normal visit would be.  By agreeing to a virtual visit, we'd like you to understand that the technology does not allow for your provider to perform an examination, and thus may limit your provider's ability to fully assess your condition. If your provider identifies any concerns that need to be evaluated in person, we will make arrangements to do so.  Finally, though the technology is pretty good, we cannot assure that it will always work on either your or our end, and in the setting of a video visit, we may have to convert it to a phone-only visit.  In either situation, we cannot ensure that we have a secure connection.  Are you willing to proceed?" STAFF: Did the patient verbally acknowledge consent to telehealth visit? Document  YES/NO here: YES   4. Advise patient to be prepared - "Two hours prior to your appointment, go ahead and check your blood pressure, pulse, oxygen saturation, and your weight (if you have the equipment to check those) and write them all down. When your visit starts, your provider will ask you for this information. If you have an Apple Watch or Kardia device, please plan to have heart rate information ready on the day of your appointment. Please have a pen and paper handy nearby the day of the visit as well."  5. Give patient instructions for MyChart download to smartphone OR Doximity/Doxy.me as below if video visit (depending on what platform provider is using)  6. Inform patient they will receive a phone call 15 minutes prior to their appointment time (may be from unknown caller ID) so they should be prepared to answer    TELEPHONE CALL NOTE  Braylee S Fabio NeighborsKenan has been deemed a candidate for a follow-up tele-health visit to limit community exposure during the Covid-19 pandemic. I spoke with the patient via phone to ensure availability of phone/video source, confirm preferred email & phone number, and discuss instructions and expectations.  I reminded Cloteal S Nowotny to be prepared with any vital sign and/or heart rhythm information that could potentially be obtained via home monitoring, at the time of her visit. I reminded Maiko S Pratte to expect a phone call prior  to her visit.  Ace Gins 12/31/2018 10:53 AM   INSTRUCTIONS FOR DOWNLOADING THE MYCHART APP TO SMARTPHONE  - The patient must first make sure to have activated MyChart and know their login information - If Apple, go to [employer] and type in MyChart in the search bar and download the app. If Android, ask patient to go to [employer] and type in [place] in the search bar and download the app. The app is free but as with any other app downloads, their phone may require them to verify saved payment information or Apple/Android  password.  - The patient will need to then log into the app with their MyChart username and password, and select [facility] as their healthcare provider to link the account. When it is time for your visit, go to the MyChart app, find appointments, and click Begin Video Visit. Be sure to Select Allow for your device to access the Microphone and Camera for your visit. You will then be connected, and your provider will be with you shortly.  **If they have any issues connecting, or need assistance please contact MyChart service desk [phone])**  **If using a computer, in order to ensure the best quality for their visit they will need to use either of the following Internet Browsers: [employer], or Google Chrome**  IF USING DOXIMITY or DOXY.ME - The patient will receive a link just prior to their visit by text.     FULL LENGTH CONSENT FOR TELE-HEALTH VISIT   I hereby voluntarily request, consent and authorize [facility] and its employed or contracted physicians, physician assistants, nurse practitioners or other licensed health care professionals (the Practitioner), to provide me with telemedicine health care services (the Services") as deemed necessary by the treating Practitioner. I acknowledge and consent to receive the Services by the Practitioner via telemedicine. I understand that the telemedicine visit will involve communicating with the Practitioner through live audiovisual communication technology and the disclosure of certain medical information by electronic transmission. I acknowledge that I have been given the opportunity to request an in-person assessment or other available alternative prior to the telemedicine visit and am voluntarily participating in the telemedicine visit.  I understand that I have the right to withhold or withdraw my consent to the use of telemedicine in the course of my care at any time, without affecting my right to future care or treatment,  and that the Practitioner or I may terminate the telemedicine visit at any time. I understand that I have the right to inspect all information obtained and/or recorded in the course of the telemedicine visit and may receive copies of available information for a reasonable fee.  I understand that some of the potential risks of receiving the Services via telemedicine include:   Delay or interruption in medical evaluation due to technological equipment failure or disruption;  Information transmitted may not be sufficient (e.g. poor resolution of images) to allow for appropriate medical decision making by the Practitioner; and/or   In rare instances, security protocols could fail, causing a breach of personal health information.  Furthermore, I acknowledge that it is my responsibility to provide information about my medical history, conditions and care that is complete and accurate to the best of my ability. I acknowledge that Practitioner's advice, recommendations, and/or decision may be based on factors not within their control, such as incomplete or inaccurate data provided by me or distortions of diagnostic images or specimens that may result from electronic transmissions. I  understand that the practice of medicine is not an exact science and that Practitioner makes no warranties or guarantees regarding treatment outcomes. I acknowledge that I will receive a copy of this consent concurrently upon execution via email to the email address I last provided but may also request a printed copy by calling the office of [facility].    I understand that my insurance will be billed for this visit.   I have read or had this consent read to me.  I understand the contents of this consent, which adequately explains the benefits and risks of the Services being provided via telemedicine.   I have been provided ample opportunity to ask questions regarding this consent and the Services and have had my questions  answered to my satisfaction.  I give my informed consent for the services to be provided through the use of telemedicine in my medical care  By participating in this telemedicine visit I agree to the above.

## 2019-01-06 ENCOUNTER — Ambulatory Visit: Payer: Medicare Other | Admitting: Cardiovascular Disease

## 2019-01-11 ENCOUNTER — Telehealth (INDEPENDENT_AMBULATORY_CARE_PROVIDER_SITE_OTHER): Payer: Medicare Other | Admitting: Cardiovascular Disease

## 2019-01-11 ENCOUNTER — Encounter: Payer: Self-pay | Admitting: Cardiovascular Disease

## 2019-01-11 ENCOUNTER — Other Ambulatory Visit: Payer: Self-pay

## 2019-01-11 VITALS — Ht 62.0 in | Wt 173.0 lb

## 2019-01-11 DIAGNOSIS — R002 Palpitations: Secondary | ICD-10-CM

## 2019-01-11 DIAGNOSIS — I471 Supraventricular tachycardia: Secondary | ICD-10-CM

## 2019-01-11 DIAGNOSIS — R42 Dizziness and giddiness: Secondary | ICD-10-CM

## 2019-01-11 NOTE — Progress Notes (Signed)
**Note Kristy-Identified via Obfuscation** Virtual Visit via Telephone Note   This visit type was conducted due to national recommendations for restrictions regarding the COVID-19 Pandemic (e.g. social distancing) in an effort to limit this patient's exposure and mitigate transmission in our community.  Due to her co-morbid illnesses, this patient is at least at moderate risk for complications without adequate follow up.  This format is felt to be most appropriate for this patient at this time.  The patient did not have access to video technology/had technical difficulties with video requiring transitioning to audio format only (telephone).  All issues noted in this document were discussed and addressed.  No physical exam could be performed with this format.  Please refer to the patient's chart for her  consent to telehealth for [facility].   Date:  01/11/2019   ID:  Kristy Mejia, DOB 08-27-1945, MRN [id number]  Patient Location: Home Provider Location: Home  PCP:  McLean-Scocuzza, Pasty Spillersracy N, MD  Cardiologist:  Lorine BearsMuhammad Arshiya Jakes, MD  Electrophysiologist:  None   Evaluation Performed:  Follow-Up Visit  Chief Complaint: Episodes of tachycardia and dizziness.  History of Present Illness:    Kristy Mejia is a 73 y.o. female who was reached via phone for a follow-up visit regarding paroxysmal supraventricular tachycardia and atypical chest pain She had an episode of SVT which required emergency room visit in December 2016 and was terminated with adenosine. Troponin was mildly elevated.  He underwent a nuclear stress test in 04/2015 which showed no evidence of ischemia with normal ejection fraction.   She had chest pain last year that was felt to be GI in nature related to starting Mobic.  Symptoms resolved after stopping the medication and taking short-term Zantac. She did have a rash with diltiazem which was discontinued.  She was tried on Toprol but that caused excessive fatigue.  Due to that, she has been using metoprolol tartrate  as needed for episodes of tachycardia.    She reports 3 episodes of tachycardia over the last few months that required taking metoprolol.  They did not respond to vagal maneuvers.  In addition to these episodes, she describes intermittent episodes of dizziness which do not seem to correlate with her tachycardia episodes.  No syncopal episodes.  No chest pain or shortness of breath.  The patient does not have symptoms concerning for COVID-19 infection (fever, chills, cough, or new shortness of breath).    Past Medical History:  Diagnosis Date  . Arthritis   . Atypical chest pain    a. 04/2015 Myoview: EF 78%, breast attenuation, no ischemia-->Low risk.  . Back pain    scoliosis  . Chicken pox   . Dysrhythmia    hx palpitations  . GERD (gastroesophageal reflux disease)   . History of hiatal hernia    noted on cxr  . Hyperlipidemia   . Joint pain   . Obesity, unspecified   . Paroxysmal SVT (supraventricular tachycardia) (HCC)    a. 2013 Holter: PACs/PVCs; b. 10/2011 Ehco: EF nl, no rwma, mild LVH, mild TR, PASP 34mmHg; c 04/2015 SVT in ED->resolved with adenosine; c.   . Pneumonia 2010   hx of  . Scoliosis    multiple areas of back follows with Dr. Dyke MaesKen Brown chiropractor    Past Surgical History:  Procedure Laterality Date  . ABDOMINAL HYSTERECTOMY     1990 ovaries intact. had 2/2 fibroids last pap 2004 neg.  ? if cervix present   . BACK SURGERY  1960's   d/t scoliosis-1964/65?  .Marland Kitchen  BACK SURGERY     x 3   . BREAST BIOPSY Left 2008   CORE W/CLIP - NEG  . BREAST BIOPSY     2000   . BREAST EXCISIONAL BIOPSY Right 20 + yrs ago  . BREAST SURGERY  1990   excision breast mass  . COLONOSCOPY  2007   Dr. Mechele Collin  . COLONOSCOPY WITH ESOPHAGOGASTRODUODENOSCOPY (EGD) AND ESOPHAGEAL DILATION (ED)    . COLONOSCOPY WITH PROPOFOL N/A 03/22/2018   Procedure: COLONOSCOPY WITH PROPOFOL;  Surgeon: Scot Jun, MD;  Location[facility] END[facility]COPY;  Service: Endoscopy;  Laterality: N/A;  .  ESOPHAGOGASTRODUODENOSCOPY (EGD) WITH PROPOFOL N/A 03/22/2018   Procedure: ESOPHAGOGASTRODUODENOSCOPY (EGD) WITH PROPOFOL;  Surgeon: Scot Jun, MD;  Location[facility] ENDOSCOPY;  Service: Endoscopy;  Laterality: N/A;  . EYE SURGERY  2007   cataracts  . FOOT SURGERY  1960's  . JOINT REPLACEMENT     total hip left with metal hardward 2016 Dr. Alphonsa Gin ortho   . OTHER SURGICAL HISTORY  2004   Bilateral cataracts  . PARTIAL HYSTERECTOMY  1990  . REVISION TOTAL HIP ARTHROPLASTY Left 04/17/2014   DR Turner Daniels  . TOTAL HIP ARTHROPLASTY  2006  . TOTAL HIP REVISION Left 04/17/2014   Procedure: LEFT TOTAL HIP REVISION;  Surgeon: Nestor Lewandowsky, MD;  Location: [facility] OR;  Service: Orthopedics;  Laterality: Left;  . TOTAL KNEE ARTHROPLASTY Right 10/30/2017   Procedure: RIGHT TOTAL KNEE ARTHROPLASTY;  Surgeon: Gean Birchwood, MD;  Location: [facility] OR;  Service: Orthopedics;  Laterality: Right;     Current Meds  Medication Sig  . aspirin EC 81 MG tablet Take 1 tablet (81 mg total) by mouth 2 (two) times daily. (Patient taking differently: Take 81 mg by mouth daily. )  . meloxicam (MOBIC) 15 MG tablet Take 15 mg by mouth daily as needed.   . metoprolol tartrate (LOPRESSOR) 25 MG tablet Take 25 mg by mouth as needed.      Allergies:   Diltiazem, Metoprolol succinate [metoprolol], and Tape   Social History   Tobacco Use  . Smoking status: Never Smoker  . Smokeless tobacco: Never Used  Substance Use Topics  . Alcohol use: Not Currently    Comment: rare  . Drug use: No     Family Hx: The patient's family history includes Aneurysm in her brother; Arthritis in her mother; Breast cancer (age of onset: 91) in her mother; Cancer in her mother; Colon cancer in her cousin; Dementia in her mother; Diabetes in her father; Early death in her brother and father; Heart attack in her father; Heart disease in her father; Hypertension in her father.  ROS:   Please see the history of present illness.     All  other systems reviewed and are negative.   Prior CV studies:   The following studies were reviewed today:  Summarized above  Labs/Other Tests and Data Reviewed:    EKG:  No ECG reviewed.  Recent Labs: 05/10/2018: TSH 2.96 10/07/2018: ALT 16; BUN 24; Creatinine, Ser 0.64; Hemoglobin 14.0; Platelets 249; Potassium 4.3; Sodium 141   Recent Lipid Panel Lab Results  Component Value Date/Time   CHOL 236 (H) 05/10/2018 08:00 AM   TRIG 67.0 05/10/2018 08:00 AM   HDL 61.40 05/10/2018 08:00 AM   CHOLHDL 4 05/10/2018 08:00 AM   LDLCALC 161 (H) 05/10/2018 08:00 AM    Wt Readings from Last 3 Encounters:  01/11/19 173 lb (78.5 kg)  06/03/18 175 lb 12.8 oz (79.7 kg)  05/21/18 174 lb 6.4 oz (79.1 kg)     Objective:    Vital Signs:  Ht 5\' 2"  (1.575 m)   Wt 173 lb (78.5 kg)   BMI 31.64 kg/m    VITAL SIGNS:  reviewed  ASSESSMENT & PLAN:    1. Paroxysmal supraventricular tachycardia:  She reports 3 episodes over the last few months that required metoprolol.  Each episode lasted about 10 to 15 minutes.  It might make sense to take metoprolol twice daily instead of as needed but she wants to wait until we get the monitor done as outlined below.  2.  Episodes of dizziness: These do not seem to correlate with her tachycardia episodes.  We have to exclude other associated arrhythmic events and thus I recommend evaluation with a 2-week outpatient monitor.   COVID-19 Education: The signs and symptoms of COVID-19 were discussed with the patient and how to seek care for testing (follow up with PCP or arrange E-visit).  The importance of social distancing was discussed today.  Time:   Today, I have spent 8 minutes with the patient with telehealth technology discussing the above problems.     Medication Adjustments/Labs and Tests Ordered: Current medicines are reviewed at length with the patient today.  Concerns regarding medicines are outlined above.   Tests Ordered: No orders of the  defined types were placed in this encounter.   Medication Changes: No orders of the defined types were placed in this encounter.   Follow Up:  In Person in 6 month(s)  Signed, Kathlyn Sacramento, MD  01/11/2019 3:41 PM    [place]

## 2019-01-11 NOTE — Patient Instructions (Signed)
Medication Instructions:  Your physician recommends that you continue on your current medications as directed. Please refer to the Current Medication list given to you today.  If you need a refill on your cardiac medications before your next appointment, please call your pharmacy.   Lab work: None ordered If you have labs (blood work) drawn today and your tests are completely normal, you will receive your results only by: Marland Kitchen. MyChart Message (if you have MyChart) OR . A paper copy in the mail If you have any lab test that is abnormal or we need to change your treatment, we will call you to review the results.  Testing/Procedures: Your physician has recommended that you wear an zio-monitor.Zio-monitors are medical devices that record the heart's electrical activity. Doctors most often us these monitors to diagnose arrhythmias. Arrhythmias are problems with the speed or rhythm of the heartbeat. The monitor is a small, portable device. You can wear one while you do your normal daily activities. This is usually used to diagnose what is causing palpitations/syncope (passing out).    Follow-Up: At [facility], you and your health needs are our priority.  As part of our continuing mission to provide you with exceptional heart care, we have created designated Provider Care Teams.  These Care Teams include your primary Cardiologist (physician) and Advanced Practice Providers (APPs -  Physician Assistants and Nurse Practitioners) who all work together to provide you with the care you need, when you need it. You will need a follow up appointment in 6 months.  Please call our office 2 months in advance to schedule this appointment.  You may see Lorine BearsMuhammad Arida, MD or one of the following Advanced Practice Providers on your designated Care Team:   Nicolasa Duckinghristopher Berge, NP Eula Listenyan Dunn, PA-C . Marisue IvanJacquelyn Visser, PA-C  Any Other Special Instructions Will Be Listed Below (If Applicable). Your physician has  recommended that you wear a Zio monitor. This monitor is a medical device that records the heart's electrical activity. Doctors most often use these monitors to diagnose arrhythmias. Arrhythmias are problems with the speed or rhythm of the heartbeat. The monitor is a small device applied to your chest. You can wear one while you do your normal daily activities. While wearing this monitor if you have any symptoms to push the button and record what you felt. Once you have worn this monitor for the period of time provider prescribed (Usually 14 days), you will return the monitor device in the postage paid box. Once it is returned they will download the data collected and provide us with a report which the provider will then review and we will call you with those results. Important tips:  1. Avoid showering during the first 24 hours of wearing the monitor. 2. Avoid excessive sweating to help maximize wear time. 3. Do not submerge the device, no hot tubs, and no swimming pools. 4. Keep any lotions or oils away from the patch. 5. After 24 hours you may shower with the patch on. Take brief showers with your back facing the shower head.  6. Do not remove patch once it has been placed because that will interrupt data and decrease adhesive wear time. 7. Push the button when you have any symptoms and write down what you were feeling. 8. Once you have completed wearing your monitor, remove and place into box which has postage paid and place in your outgoing mailbox.  9. If for some reason you have misplaced your box then call our  office and we can provide another box and/or mail it off for you.

## 2019-01-13 ENCOUNTER — Telehealth: Payer: Self-pay

## 2019-01-13 NOTE — Telephone Encounter (Signed)
Called the patient to f/u after her 01/11/19 telehealth visit with Dr.Arida. The order for the 2 week zio-monitor has been placed, and the monitor will be mailed to her home. Patient should follow the instructions for applying and returning the monitor. Patient is to contact the office if any questions or concerns.

## 2019-01-18 ENCOUNTER — Encounter: Payer: Self-pay | Admitting: Podiatry

## 2019-01-18 ENCOUNTER — Ambulatory Visit (INDEPENDENT_AMBULATORY_CARE_PROVIDER_SITE_OTHER): Payer: Medicare Other | Admitting: Podiatry

## 2019-01-18 ENCOUNTER — Other Ambulatory Visit: Payer: Self-pay

## 2019-01-18 DIAGNOSIS — L989 Disorder of the skin and subcutaneous tissue, unspecified: Secondary | ICD-10-CM | POA: Diagnosis not present

## 2019-01-19 ENCOUNTER — Ambulatory Visit (INDEPENDENT_AMBULATORY_CARE_PROVIDER_SITE_OTHER): Payer: Medicare Other

## 2019-01-19 DIAGNOSIS — R002 Palpitations: Secondary | ICD-10-CM | POA: Diagnosis not present

## 2019-01-19 DIAGNOSIS — R42 Dizziness and giddiness: Secondary | ICD-10-CM

## 2019-01-20 NOTE — Progress Notes (Signed)
   Subjective: 73 y.o. female presenting to the office today for follow up evaluation of painful callus lesions noted to the bilateral feet that have been present for the past several years. Walking and bearing weight increases the pain. She has not had any recent treatment. Patient is here for further evaluation and treatment.   Past Medical History:  Diagnosis Date  . Arthritis   . Atypical chest pain    a. 04/2015 Myoview: EF 78%, breast attenuation, no ischemia-->Low risk.  . Back pain    scoliosis  . Chicken pox   . Dysrhythmia    hx palpitations  . GERD (gastroesophageal reflux disease)   . History of hiatal hernia    noted on cxr  . Hyperlipidemia   . Joint pain   . Obesity, unspecified   . Paroxysmal SVT (supraventricular tachycardia) ([place])    a. 2013 Holter: PACs/PVCs; b. 10/2011 Ehco: EF nl, no rwma, mild LVH, mild TR, PASP 67mmHg; c 04/2015 SVT in ED->resolved with adenosine; c.   . Pneumonia 2010   hx of  . Scoliosis    multiple areas of back follows with Dr. [facility] chiropractor      Objective:  Physical Exam General: Alert and oriented x3 in no acute distress  Dermatology: Hyperkeratotic lesion(s) present on the bilateral feet. Pain on palpation with a central nucleated core noted. Skin is warm, dry and supple bilateral lower extremities. Negative for open lesions or macerations.  Vascular: Palpable pedal pulses bilaterally. No edema or erythema noted. Capillary refill within normal limits.  Neurological: Epicritic and protective threshold grossly intact bilaterally.   Musculoskeletal Exam: Pain on palpation at the keratotic lesion(s) noted. Range of motion within normal limits bilateral. Muscle strength 5/5 in all groups bilateral.  Assessment: 1. Pre-ulcerative callus lesions bilateral x 4   Plan of Care:  1. Patient evaluated 2. Excisional debridement of keratoic lesion using a chisel blade was performed without incident.  3. Dressed area with light  dressing. 4. Recommended OTC corn and callus remover.  5. Recommended good shoe gear.  6. Patient is to return to the clinic in 3 months.   Edrick Kins, DPM [facility]  Dr. Edrick Kins, [place]                                        [place], [place] [address]                Office [phone]  Fax [phone]

## 2019-01-26 ENCOUNTER — Other Ambulatory Visit (INDEPENDENT_AMBULATORY_CARE_PROVIDER_SITE_OTHER): Payer: Medicare Other

## 2019-01-26 ENCOUNTER — Other Ambulatory Visit: Payer: Self-pay

## 2019-01-26 DIAGNOSIS — R7303 Prediabetes: Secondary | ICD-10-CM | POA: Diagnosis not present

## 2019-01-26 DIAGNOSIS — R232 Flushing: Secondary | ICD-10-CM | POA: Diagnosis not present

## 2019-01-26 DIAGNOSIS — E785 Hyperlipidemia, unspecified: Secondary | ICD-10-CM | POA: Diagnosis not present

## 2019-01-26 LAB — COMPREHENSIVE METABOLIC PANEL
ALT: 11 U/L (ref 0–35)
AST: 17 U/L (ref 0–37)
Albumin: 3.9 g/dL (ref 3.5–5.2)
Alkaline Phosphatase: 85 U/L (ref 39–117)
BUN: 22 mg/dL (ref 6–23)
CO2: 28 mEq/L (ref 19–32)
Calcium: 9.3 mg/dL (ref 8.4–10.5)
Chloride: 105 mEq/L (ref 96–112)
Creatinine, Ser: 0.74 mg/dL (ref 0.40–1.20)
GFR: 93.66 mL/min (ref >60.00)
Glucose, Bld: 88 mg/dL (ref 70–99)
Potassium: 3.7 mEq/L (ref 3.5–5.1)
Sodium: 139 mEq/L (ref 135–145)
Total Bilirubin: 0.6 mg/dL (ref 0.2–1.2)
Total Protein: 6.4 g/dL (ref 6.0–8.3)

## 2019-01-26 LAB — LIPID PANEL
Cholesterol: 225 mg/dL — ABNORMAL HIGH (ref 0–200)
HDL: 61.8 mg/dL (ref >39.00)
LDL Cholesterol: 151 mg/dL — ABNORMAL HIGH (ref 0–99)
NonHDL: 163.04
Total CHOL/HDL Ratio: 4
Triglycerides: 62 mg/dL (ref 0.0–149.0)
VLDL: 12.4 mg/dL (ref 0.0–40.0)

## 2019-01-26 LAB — HEMOGLOBIN A1C: Hgb A1c MFr Bld: 6 % (ref 4.6–6.5)

## 2019-02-01 ENCOUNTER — Encounter: Payer: Self-pay | Admitting: Internal Medicine

## 2019-02-01 ENCOUNTER — Ambulatory Visit (INDEPENDENT_AMBULATORY_CARE_PROVIDER_SITE_OTHER): Payer: Medicare Other | Admitting: Internal Medicine

## 2019-02-01 ENCOUNTER — Other Ambulatory Visit: Payer: Self-pay

## 2019-02-01 VITALS — Ht 62.0 in | Wt 173.0 lb

## 2019-02-01 DIAGNOSIS — D72819 Decreased white blood cell count, unspecified: Secondary | ICD-10-CM

## 2019-02-01 DIAGNOSIS — R7303 Prediabetes: Secondary | ICD-10-CM | POA: Diagnosis not present

## 2019-02-01 DIAGNOSIS — E785 Hyperlipidemia, unspecified: Secondary | ICD-10-CM | POA: Diagnosis not present
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
## 2019-02-01 NOTE — Patient Instructions (Signed)
Pravastatin tablets What is this medicine? PRAVASTATIN (PRA va stat in) is known as a HMG-CoA reductase inhibitor or 'statin'. It lowers the level of cholesterol and triglycerides in the blood. This drug may also reduce the risk of heart attack, stroke, or other health problems in patients with risk factors for heart disease. Diet and lifestyle changes are often used with this drug. This medicine may be used for other purposes; ask your health care provider or pharmacist if you have questions. COMMON BRAND NAME(S): Pravachol What should I tell my health care provider before I take this medicine? They need to know if you have any of these conditions:  diabetes  if you often drink alcohol  history of stroke  kidney disease  liver disease  muscle aches or weakness  thyroid disease  an unusual or allergic reaction to pravastatin, other medicines, foods, dyes, or preservatives  pregnant or trying to get pregnant  breast-feeding How should I use this medicine? Take pravastatin tablets by mouth. Swallow the tablets with a drink of water. Pravastatin can be taken at anytime of the day, with or without food. Follow the directions on the prescription label. Take your doses at regular intervals. Do not take your medicine more often than directed. Talk to your pediatrician regarding the use of this medicine in children. Special care may be needed. Pravastatin has been used in children as young as 558 years of age. Overdosage: If you think you have taken too much of this medicine contact a poison control center or emergency room at once. NOTE: This medicine is only for you. Do not share this medicine with others. What if I miss a dose? If you miss a dose, take it as soon as you can. If it is almost time for your next dose, take only that dose. Do not take double or extra doses. What may interact with this medicine? This medicine may interact with the following  medications:  colchicine  cyclosporine  other medicines for high cholesterol  some antibiotics like azithromycin, clarithromycin, erythromycin, and telithromycin This list may not describe all possible interactions. Give your health care provider a list of all the medicines, herbs, non-prescription drugs, or dietary supplements you use. Also tell them if you smoke, drink alcohol, or use illegal drugs. Some items may interact with your medicine. What should I watch for while using this medicine? Visit your doctor or health care professional for regular check-ups. You may need regular tests to make sure your liver is working properly. Your health care professional may tell you to stop taking this medicine if you develop muscle problems. If your muscle problems do not go away after stopping this medicine, contact your health care professional. Do not become pregnant while taking this medicine. Women should inform their health care professional if they wish to become pregnant or think they might be pregnant. There is a potential for serious side effects to an unborn child. Talk to your health care professional or pharmacist for more information. Do not breast-feed an infant while taking this medicine. This medicine may affect blood sugar levels. If you have diabetes, check with your doctor or health care professional before you change your diet or the dose of your diabetic medicine. If you are going to need surgery or other procedure, tell your doctor that you are using this medicine. This drug is only part of a total heart-health program. Your doctor or a dietician can suggest a low-cholesterol and low-fat diet to help. Avoid alcohol and  smoking, and keep a proper exercise schedule. This medicine may cause a decrease in Co-Enzyme Q-10. You should make sure that you get enough Co-Enzyme Q-10 while you are taking this medicine. Discuss the foods you eat and the vitamins you take with your health care  professional. What side effects may I notice from receiving this medicine? Side effects that you should report to your doctor or health care professional as soon as possible:  allergic reactions like skin rash, itching or hives, swelling of the face, lips, or tongue  dark urine  fever  muscle pain, cramps, or weakness  redness, blistering, peeling or loosening of the skin, including inside the mouth  trouble passing urine or change in the amount of urine  unusually weak or tired  yellowing of the eyes or skin Side effects that usually do not require medical attention (report to your doctor or health care professional if they continue or are bothersome):  gas  headache  heartburn  indigestion  stomach pain This list may not describe all possible side effects. Call your doctor for medical advice about side effects. You may report side effects to FDA at 1-800-FDA-1088. Where should I keep my medicine? Keep out of the reach of children. Store at room temperature between 15 to 30 degrees C (59 to 86 degrees F). Protect from light. Keep container tightly closed. Throw away any unused medicine after the expiration date. NOTE: This sheet is a summary. It may not cover all possible information. If you have questions about this medicine, talk to your doctor, pharmacist, or health care provider.  2020 Elsevier/Gold Standard (2017-01-06 12:37:09)

## 2019-02-25 ENCOUNTER — Telehealth: Payer: Self-pay

## 2019-02-25 NOTE — Telephone Encounter (Signed)
Patient made aware of monitor results and Dr.Arida's recommendation. Patient sts that she developed a severe rash all over her body the last time she was prescribed Metoprolol Succinate daily.  She is concerned that she may have the same reaction if she takes the lopressor bid. Patient sts that currently she only takes lopressor for episodes of tachycardiac lasting >10-56min. That averages a couple of times a month. Advised the patient that I will fwd the update to Dr. Fletcher Anon and call back with his recommendation.

## 2019-02-25 NOTE — Telephone Encounter (Signed)
Patient calling back to let us know that she confirmed previous severe reaction was actually Diltiazem and she doesn't recall what the reaction was with metoprolol .    Patient is not sure if she will have a reaction .

## 2019-02-25 NOTE — Telephone Encounter (Signed)
-----   Message from Wellington Hampshire, MD sent at 02/25/2019 12:51 PM EDT ----- Inform patient that monitor showed frequent episodes of supraventricular tachycardia which correlated with her symptoms.  Recommend that she takes metoprolol 25 mg twice daily on a regular basis instead of as needed.

## 2019-02-28 MED ORDER — METOPROLOL TARTRATE 25 MG PO TABS: 25.0000 mg | ORAL_TABLET | Freq: Two times a day (BID) | ORAL | 5 refills | Status: DC

## 2019-02-28 NOTE — Telephone Encounter (Signed)
Patient made aware of Dr. Tyrell Antonio recommendation. Patient is agreeable with the plan. Rx for Metoprolol tartrate 25mg  bid sent to the patient's pharmacy. Patient verbalized understanding to the info given and voiced appreciation for the call.

## 2019-02-28 NOTE — Telephone Encounter (Signed)
I do not think she is allergic to metoprolol.  It caused fatigue in the past but not true allergy.  She can start with 25 mg twice daily and if she feels tired she can cut it down to half a tablet twice daily.

## 2019-04-07 ENCOUNTER — Ambulatory Visit: Payer: Medicare Other | Admitting: Podiatry

## 2019-04-07 ENCOUNTER — Encounter: Payer: Self-pay | Admitting: Podiatry

## 2019-04-07 ENCOUNTER — Other Ambulatory Visit: Payer: Self-pay

## 2019-04-07 DIAGNOSIS — Q828 Other specified congenital malformations of skin: Secondary | ICD-10-CM

## 2019-04-07 DIAGNOSIS — L989 Disorder of the skin and subcutaneous tissue, unspecified: Secondary | ICD-10-CM | POA: Diagnosis not present

## 2019-04-07 DIAGNOSIS — M2042 Other hammer toe(s) (acquired), left foot: Secondary | ICD-10-CM

## 2019-04-07 NOTE — Progress Notes (Signed)
This patient presents the office for an evaluation and treatment of her painful callus both feet.  She says the calluses are painful walking and wearing her shoes.  She says the corn on the fourth toe left foot is the most painful callus.  She is unable to self treat and presents the office today for preventative foot care services.  Vascular  Dorsalis pedis and posterior tibial pulses are palpable  B/L.  Capillary return  WNL.  Temperature gradient is  WNL.  Skin turgor  WNL  Sensorium  Senn Weinstein monofilament wire  WNL. Normal tactile sensation.  Nail Exam  Patient has normal nails with no evidence of bacterial or fungal infection.  Thick nail 3rd left  Orthopedic  Exam  Muscle tone and muscle strength  WNL.  No limitations of motion feet  B/L.  No crepitus or joint effusion noted.  Foot type is unremarkable and digits show no abnormalities.  HAV  B/L.  Hammer toes 2-5  B/L. Plantarflex 5th met left foot.  Skin  No open lesions.  Normal skin texture and turgor. Callus sub 1  B/L.  HD 4th toe left foot.   Porokeratosis sub 5th met left foot.  Callus/Porokeratosis  B/L  Debride callus/porokeratosis  B/L.  RTC 10 weeks.   Gardiner Barefoot DPM

## 2019-06-16 ENCOUNTER — Encounter: Payer: Self-pay | Admitting: Podiatry

## 2019-06-16 ENCOUNTER — Ambulatory Visit: Payer: Medicare PPO | Admitting: Podiatry

## 2019-06-16 ENCOUNTER — Other Ambulatory Visit: Payer: Self-pay

## 2019-06-16 DIAGNOSIS — L84 Corns and callosities: Secondary | ICD-10-CM

## 2019-06-16 DIAGNOSIS — M79674 Pain in right toe(s): Secondary | ICD-10-CM | POA: Diagnosis not present

## 2019-06-16 DIAGNOSIS — M79675 Pain in left toe(s): Secondary | ICD-10-CM

## 2019-06-16 DIAGNOSIS — B351 Tinea unguium: Secondary | ICD-10-CM | POA: Diagnosis not present

## 2019-06-16 DIAGNOSIS — M2042 Other hammer toe(s) (acquired), left foot: Secondary | ICD-10-CM

## 2019-06-16 NOTE — Progress Notes (Signed)
This patient presents the office for an evaluation and treatment of her painful callus both feet.  She says the calluses are painful walking and wearing her shoes.  She says the corn on the fourth toe left foot is the most painful callus.  She is unable to self treat and presents the office today for preventative foot care services.  Vascular  Dorsalis pedis and posterior tibial pulses are palpable  B/L.  Capillary return  WNL.  Temperature gradient is  WNL.  Skin turgor  WNL  Sensorium  Senn Weinstein monofilament wire  WNL. Normal tactile sensation.  Nail Exam  Patient has normal nails with no evidence of bacterial or fungal infection.  Thick nail 4th nail left  Orthopedic  Exam  Muscle tone and muscle strength  WNL.  No limitations of motion feet  B/L.  No crepitus or joint effusion noted.  Foot type is unremarkable and digits show no abnormalities.  HAV  B/L.  Hammer toes 2-5  B/L. Plantarflex 5th met left foot.  Skin  No open lesions.  Normal skin texture and turgor.   HD 4th toe left foot.   Porokeratosis sub 5th met left foot. Asymptomatic.  Callus/Porokeratosis  B/L  Onychomycosis  X 1  Debride corn 4th toe left.  Debride nail  X 1. RTC 10 weeks.   Gardiner Barefoot DPM

## 2019-07-12 ENCOUNTER — Encounter: Payer: Self-pay | Admitting: Cardiovascular Disease

## 2019-07-12 ENCOUNTER — Other Ambulatory Visit: Payer: Self-pay

## 2019-07-12 ENCOUNTER — Ambulatory Visit (INDEPENDENT_AMBULATORY_CARE_PROVIDER_SITE_OTHER): Payer: Medicare PPO | Admitting: Cardiovascular Disease

## 2019-07-12 VITALS — BP 150/86 | HR 70 | Ht 62.0 in | Wt 185.5 lb

## 2019-07-12 DIAGNOSIS — I471 Supraventricular tachycardia: Secondary | ICD-10-CM | POA: Diagnosis not present

## 2019-07-12 DIAGNOSIS — R0602 Shortness of breath: Secondary | ICD-10-CM | POA: Diagnosis not present

## 2019-07-12 DIAGNOSIS — R079 Chest pain, unspecified: Secondary | ICD-10-CM

## 2019-07-12 MED ORDER — METOPROLOL TARTRATE 25 MG PO TABS: 25.0000 mg | ORAL_TABLET | Freq: Every day | ORAL | Status: DC | PRN

## 2019-07-12 NOTE — Patient Instructions (Signed)
Medication Instructions:  Your physician has recommended you make the following change in your medication:   DECREASE Metoprolol to 25 mg twice daily for 2 days.  Then Take only as needed.  *If you need a refill on your cardiac medications before your next appointment, please call your pharmacy*  Lab Work: None ordered If you have labs (blood work) drawn today and your tests are completely normal, you will receive your results only by: Marland Kitchen MyChart Message (if you have MyChart) OR . A paper copy in the mail If you have any lab test that is abnormal or we need to change your treatment, we will call you to review the results.  Testing/Procedures: Your physician has requested that you have a lexiscan myoview. For further information please visit [email or web address]. Please follow instruction sheet, as given.  Your physician has requested that you have an echocardiogram. Echocardiography is a painless test that uses sound waves to create images of your heart. It provides your doctor with information about the size and shape of your heart and how well your heart's chambers and valves are working. This procedure takes approximately one hour. There are no restrictions for this procedure.    Follow-Up: At [facility], you and your health needs are our priority.  As part of our continuing mission to provide you with exceptional heart care, we have created designated Provider Care Teams.  These Care Teams include your primary Cardiologist (physician) and Advanced Practice Providers (APPs -  Physician Assistants and Nurse Practitioners) who all work together to provide you with the care you need, when you need it.  Your next appointment:   4 week(s)  The format for your next appointment:   In Person  Provider:    You may see Kathlyn Sacramento, MD or one of the following Advanced Practice Providers on your designated Care Team:    Murray Hodgkins, NP  Christell Faith, PA-C  Marrianne Mood,  PA-C   Other Instructions [facility] [place]  Your caregiver has ordered a Stress Test with nuclear imaging. The purpose of this test is to evaluate the blood supply to your heart muscle. This procedure is referred to as a "Non-Invasive Stress Test." This is because other than having an IV started in your vein, nothing is inserted or "invades" your body. Cardiac stress tests are done to find areas of poor blood flow to the heart by determining the extent of coronary artery disease (CAD). Some patients exercise on a treadmill, which naturally increases the blood flow to your heart, while others who are  unable to walk on a treadmill due to physical limitations have a pharmacologic/chemical stress agent called Lexiscan . This medicine will mimic walking on a treadmill by temporarily increasing your coronary blood flow.   Please note: these test may take anywhere between 2-4 hours to complete  PLEASE REPORT TO [facility] AT THE FIRST DESK WILL DIRECT YOU WHERE TO GO  Date of Procedure:_____________________________________  Arrival Time for Procedure:______________________________  Instructions regarding medication:    _X___:  Hold betablocker(Metoprolol) night before procedure and morning of procedure  PLEASE NOTIFY THE OFFICE AT LEAST 24 HOURS IN ADVANCE IF YOU ARE UNABLE TO KEEP YOUR APPOINTMENT.  [id number] AND  PLEASE NOTIFY NUCLEAR MEDICINE AT [facility] AT LEAST 24 HOURS IN ADVANCE IF YOU ARE UNABLE TO KEEP YOUR APPOINTMENT. [phone]  How to prepare for your Myoview test:  1. Do not eat or drink after midnight 2. No caffeine for 24 hours  prior to test 3. No smoking 24 hours prior to test. 4. Your medication may be taken with water.  If your doctor stopped a medication because of this test, do not take that medication. 5. Ladies, please do not wear dresses.  Skirts or pants are appropriate. Please wear a short sleeve shirt. 6. No perfume, cologne or  lotion. 7. Wear comfortable walking shoes. No heels!

## 2019-07-12 NOTE — Progress Notes (Signed)
Cardiology Office Note   Date:  07/12/2019   ID:  Amran, Malter [birth year], MRN [id number]  PCP:  McLean-Scocuzza, Pasty Spillers, MD  Cardiologist:   Lorine Bears, MD   Chief Complaint  Patient presents with  . office visit    6 month F/U-Patient associates metoprolol with SOB; Meds verbally reviewed with patient.      History of Present Illness: Kristy Mejia is a 74 y.o. female who presents for follow-up visit regarding paroxysmal supraventricular tachycardia and atypical chest pain She had an episode of SVT which required emergency room visit in December 2016 and was terminated with adenosine. Troponin was mildly elevated.  He underwent a nuclear stress test in 04/2015 which showed no evidence of ischemia with normal ejection fraction.   She had chest pain last year that was felt to be GI in nature related to starting Mobic.  Symptoms resolved after stopping the medication and taking short-term Zantac. She did have a rash with diltiazem which was discontinued.  She was tri  She was seen last year and complained of increased palpitations.  An outpatient monitor showed frequent short runs of SVT.  Thus, I resumed metoprolol at 25 mg twice daily.  Over the last few months, she noticed increased fatigue, exertional dyspnea and exertional chest tightness which lasts for few minutes.  She thinks the symptoms are due to metoprolol so she cut down the morning dose to 12.5 mg and continue to take 25 mg in the evening.  She feels that her palpitations are not as bad as before.  She gets exertional symptoms with minimal walking.     Past Medical History:  Diagnosis Date  . Arthritis   . Atypical chest pain    a. 04/2015 Myoview: EF 78%, breast attenuation, no ischemia-->Low risk.  . Back pain    scoliosis  . Chicken pox   . Dysrhythmia    hx palpitations  . GERD (gastroesophageal reflux disease)   . History of hiatal hernia    noted on cxr  . Hyperlipidemia   . Joint pain   .  Obesity, unspecified   . Paroxysmal SVT (supraventricular tachycardia) (HCC)    a. 2013 Holter: PACs/PVCs; b. 10/2011 Ehco: EF nl, no rwma, mild LVH, mild TR, PASP ; c 04/2015 SVT in ED->resolved with adenosine; c.   . Pneumonia 2010   hx of  . Scoliosis    multiple areas of back follows with Dr. Dyke Maes chiropractor     Past Surgical History:  Procedure Laterality Date  . ABDOMINAL HYSTERECTOMY     1990 ovaries intact. had 2/2 fibroids last pap 2004 neg.  ? if cervix present   . BACK SURGERY  1960's   d/t scoliosis-1964/65?  Marland Kitchen BACK SURGERY     x 3   . BREAST BIOPSY Left 2008   CORE W/CLIP - NEG  . BREAST BIOPSY     2000   . BREAST EXCISIONAL BIOPSY Right 20 + yrs ago  . BREAST SURGERY  1990   excision breast mass  . COLONOSCOPY  2007   Dr. Mechele Collin  . COLONOSCOPY WITH ESOPHAGOGASTRODUODENOSCOPY (EGD) AND ESOPHAGEAL DILATION (ED)    . COLONOSCOPY WITH PROPOFOL N/A 03/22/2018   Procedure: COLONOSCOPY WITH PROPOFOL;  Surgeon: Scot Jun, MD;  [facility] ENDOSCOPY;  Service: Endoscopy;  Laterality: N/A;  . ESOPHAGOGASTRODUODENOSCOPY (EGD) WITH PROPOFOL N/A 03/22/2018   Procedure: ESOPHAGOGASTRODUODENOSCOPY (EGD) WITH PROPOFOL;  Surgeon: Scot Jun, MD;  Location: [facility] ENDOSCOPY;  Service: Endoscopy;  Laterality: N/A;  . EYE SURGERY  2007   cataracts  . FOOT SURGERY  1960's  . JOINT REPLACEMENT     total hip left with metal hardward 2016 Dr. Alphonsa Gin ortho   . OTHER SURGICAL HISTORY  2004   Bilateral cataracts  . PARTIAL HYSTERECTOMY  1990  . REVISION TOTAL HIP ARTHROPLASTY Left 04/17/2014   DR Turner Daniels  . TOTAL HIP ARTHROPLASTY  2006  . TOTAL HIP REVISION Left 04/17/2014   Procedure: LEFT TOTAL HIP REVISION;  Surgeon: Nestor Lewandowsky, MD;  Location: [facility] OR;  Service: Orthopedics;  Laterality: Left;  . TOTAL KNEE ARTHROPLASTY Right 10/30/2017   Procedure: RIGHT TOTAL KNEE ARTHROPLASTY;  Surgeon: Gean Birchwood, MD;  Location: [facility] OR;  Service: Orthopedics;   Laterality: Right;     Current Outpatient Medications  Medication Sig Dispense Refill  . aspirin EC 81 MG tablet Take 1 tablet (81 mg total) by mouth 2 (two) times daily. (Patient taking differently: Take 81 mg by mouth daily. ) 60 tablet 0  . meloxicam (MOBIC) 15 MG tablet Take 15 mg by mouth daily as needed.     . metoprolol tartrate (LOPRESSOR) 25 MG tablet Take 1 tablet (25 mg total) by mouth 2 (two) times daily. (Patient taking differently: Take 25 mg by mouth 2 (two) times daily. Taking 1/2 tablet in AM and 1 tablet in PM) 60 tablet 5  . omeprazole (PRILOSEC) 40 MG capsule Take 40 mg by mouth daily.      No current facility-administered medications for this visit.    Allergies:   Diltiazem, Metoprolol succinate [metoprolol], and Tape    Social History:  The patient  reports that she has never smoked. She has never used smokeless tobacco. She reports previous alcohol use. She reports that she does not use drugs.   Family History:  The patient's family history includes Aneurysm in her brother; Arthritis in her mother; Breast cancer (age of onset: 56) in her mother; Cancer in her mother; Colon cancer in her cousin; Dementia in her mother; Diabetes in her father; Early death in her brother and father; Heart attack in her father; Heart disease in her father; Hyperlipidemia in her father; Hypertension in her father.    ROS:  Please see the history of present illness.   Otherwise, review of systems are positive for none.   All other systems are reviewed and negative.    PHYSICAL EXAM: VS:  BP (!) 150/86 (BP Location: Left Arm, Patient Position: Sitting, Cuff Size: Normal)   Pulse 70   Ht 5\' 2"  (1.575 m)   Wt 185 lb 8 oz (84.1 kg)   SpO2 97%   BMI 33.93 kg/m  , BMI Body mass index is 33.93 kg/m. GEN: Well nourished, well developed, in no acute distress  HEENT: normal  Neck: no JVD, carotid bruits, or masses Cardiac: RRR; no murmurs, rubs, or gallops,no edema  Respiratory:  clear  to auscultation bilaterally, normal work of breathing GI: soft, nontender, nondistended, + BS MS: no deformity or atrophy  Skin: warm and dry, no rash Neuro:  Strength and sensation are intact Psych: euthymic mood, full affect   EKG:  EKG is ordered today. The ekg ordered today demonstrates normal sinus rhythm with no significant ST or T wave changes.   Recent Labs: 10/07/2018: Hemoglobin 14.0; Platelets 249 01/26/2019: ALT 11; BUN 22; Creatinine, Ser 0.74; Potassium 3.7; Sodium 139    Lipid Panel    Component Value Date/Time  CHOL 225 (H) 01/26/2019 0817   TRIG 62.0 01/26/2019 0817   HDL 61.80 01/26/2019 0817   CHOLHDL 4 01/26/2019 0817   VLDL 12.4 01/26/2019 0817   LDLCALC 151 (H) 01/26/2019 0817      Wt Readings from Last 3 Encounters:  07/12/19 185 lb 8 oz (84.1 kg)  02/01/19 173 lb (78.5 kg)  01/11/19 173 lb (78.5 kg)       No flowsheet data found.    ASSESSMENT AND PLAN:   1. Paroxysmal supraventricular tachycardia:  Her symptoms improved with small dose metoprolol but she reports side effects including fatigue and shortness of breath and she wants to be weaned off metoprolol.  We will going to stop this medication after decreasing the dose to 12.5 mg twice daily for 2 days.  She can continue to use it as needed for breakthrough tachycardia. If palpitations worsen after stopping metoprolol, we can consider a different medication like Bystolic.  2.  Exertional chest pain and shortness of breath: Symptoms are worrisome for angina.  I requested a Lexiscan Myoview.  In addition, I requested an echocardiogram to evaluate diastolic function and pulmonary pressure.   Disposition:   FU with me in 1 months  Signed,  Kathlyn Sacramento, MD  07/12/2019 2:25 PM    [place]

## 2019-07-20 ENCOUNTER — Other Ambulatory Visit: Payer: Medicare PPO | Admitting: Orthotics

## 2019-07-20 ENCOUNTER — Encounter
Admission: RE | Admit: 2019-07-20 | Discharge: 2019-07-20 | Disposition: A | Discharge status: Home / Self Care | Payer: Medicare PPO | Source: Ambulatory Visit | Attending: Cardiovascular Disease | Admitting: Cardiovascular Disease

## 2019-07-20 ENCOUNTER — Other Ambulatory Visit: Payer: Self-pay

## 2019-07-20 DIAGNOSIS — R079 Chest pain, unspecified: Secondary | ICD-10-CM | POA: Diagnosis not present

## 2019-07-20 LAB — NM MYOCAR MULTI W/SPECT W/WALL MOTION / EF
Estimated workload: 1 METS
Exercise duration (min): 0 min
Exercise duration (sec): 0 s
LV dias vol: 46 mL (ref 46–106)
LV sys vol: 9 mL
MPHR: 149 {beats}/min
Peak HR: 123 {beats}/min
Percent HR: 82 %
Rest HR: 69 {beats}/min
SDS: 0
SRS: 18
SSS: 7
TID: 1.04

## 2019-07-20 MED ORDER — TECHNETIUM TC 99M TETROFOSMIN IV KIT
30.0000 | PACK | Freq: Once | INTRAVENOUS | Status: AC | PRN
  Administered 2019-07-20: 31.358 via INTRAVENOUS

## 2019-07-20 MED ORDER — TECHNETIUM TC 99M TETROFOSMIN IV KIT
10.0000 | PACK | Freq: Once | INTRAVENOUS | Status: AC | PRN
  Administered 2019-07-20: 10.91 via INTRAVENOUS

## 2019-07-20 MED ORDER — REGADENOSON 0.4 MG/5ML IV SOLN
0.4000 mg | Freq: Once | INTRAVENOUS | Status: AC
  Administered 2019-07-20: 0.4 mg via INTRAVENOUS

## 2019-07-26 ENCOUNTER — Other Ambulatory Visit: Payer: Self-pay

## 2019-07-26 ENCOUNTER — Other Ambulatory Visit (INDEPENDENT_AMBULATORY_CARE_PROVIDER_SITE_OTHER): Payer: Medicare PPO | Admitting: Orthotics

## 2019-07-26 DIAGNOSIS — L84 Corns and callosities: Secondary | ICD-10-CM

## 2019-07-26 DIAGNOSIS — M2042 Other hammer toe(s) (acquired), left foot: Secondary | ICD-10-CM

## 2019-07-29 ENCOUNTER — Other Ambulatory Visit: Payer: Self-pay

## 2019-07-29 ENCOUNTER — Ambulatory Visit (INDEPENDENT_AMBULATORY_CARE_PROVIDER_SITE_OTHER): Payer: Medicare PPO

## 2019-07-29 DIAGNOSIS — R0602 Shortness of breath: Secondary | ICD-10-CM | POA: Diagnosis not present

## 2019-07-31 ENCOUNTER — Other Ambulatory Visit: Payer: Self-pay

## 2019-07-31 ENCOUNTER — Emergency Department: Payer: Medicare PPO

## 2019-07-31 ENCOUNTER — Observation Stay
Admission: EM | Admit: 2019-07-31 | Discharge: 2019-08-01 | Disposition: A | Discharge status: Home / Self Care | Payer: Medicare PPO | Attending: Internal Medicine | Admitting: Internal Medicine

## 2019-07-31 ENCOUNTER — Encounter: Payer: Self-pay | Admitting: Emergency Medicine

## 2019-07-31 DIAGNOSIS — R7303 Prediabetes: Secondary | ICD-10-CM | POA: Insufficient documentation

## 2019-07-31 DIAGNOSIS — Z7982 Long term (current) use of aspirin: Secondary | ICD-10-CM | POA: Insufficient documentation

## 2019-07-31 DIAGNOSIS — R778 Other specified abnormalities of plasma proteins: Secondary | ICD-10-CM | POA: Diagnosis not present

## 2019-07-31 DIAGNOSIS — I491 Atrial premature depolarization: Secondary | ICD-10-CM | POA: Diagnosis not present

## 2019-07-31 DIAGNOSIS — R000 Tachycardia, unspecified: Secondary | ICD-10-CM | POA: Diagnosis present

## 2019-07-31 DIAGNOSIS — E669 Obesity, unspecified: Secondary | ICD-10-CM | POA: Diagnosis not present

## 2019-07-31 DIAGNOSIS — R739 Hyperglycemia, unspecified: Secondary | ICD-10-CM

## 2019-07-31 DIAGNOSIS — K219 Gastro-esophageal reflux disease without esophagitis: Secondary | ICD-10-CM | POA: Diagnosis not present

## 2019-07-31 DIAGNOSIS — E785 Hyperlipidemia, unspecified: Secondary | ICD-10-CM | POA: Diagnosis not present

## 2019-07-31 DIAGNOSIS — K449 Diaphragmatic hernia without obstruction or gangrene: Secondary | ICD-10-CM | POA: Diagnosis not present

## 2019-07-31 DIAGNOSIS — Z8249 Family history of ischemic heart disease and other diseases of the circulatory system: Secondary | ICD-10-CM | POA: Insufficient documentation

## 2019-07-31 DIAGNOSIS — I471 Supraventricular tachycardia, unspecified: Secondary | ICD-10-CM | POA: Diagnosis present

## 2019-07-31 DIAGNOSIS — Z03818 Encounter for observation for suspected exposure to other biological agents ruled out: Secondary | ICD-10-CM | POA: Diagnosis not present

## 2019-07-31 DIAGNOSIS — Z79899 Other long term (current) drug therapy: Secondary | ICD-10-CM | POA: Diagnosis not present

## 2019-07-31 DIAGNOSIS — Z791 Long term (current) use of non-steroidal anti-inflammatories (NSAID): Secondary | ICD-10-CM | POA: Diagnosis not present

## 2019-07-31 DIAGNOSIS — Z20822 Contact with and (suspected) exposure to covid-19: Secondary | ICD-10-CM | POA: Diagnosis not present

## 2019-07-31 DIAGNOSIS — R0989 Other specified symptoms and signs involving the circulatory and respiratory systems: Secondary | ICD-10-CM | POA: Diagnosis not present

## 2019-07-31 LAB — TROPONIN I (HIGH SENSITIVITY)
Troponin I (High Sensitivity): 16 ng/L (ref <18)
Troponin I (High Sensitivity): 46 ng/L — ABNORMAL HIGH (ref <18)
Troponin I (High Sensitivity): 83 ng/L — ABNORMAL HIGH (ref <18)

## 2019-07-31 LAB — COMPREHENSIVE METABOLIC PANEL
ALT: 34 U/L (ref 0–44)
AST: 57 U/L — ABNORMAL HIGH (ref 15–41)
Albumin: 3.9 g/dL (ref 3.5–5.0)
Alkaline Phosphatase: 93 U/L (ref 38–126)
Anion gap: 9 (ref 5–15)
BUN: 18 mg/dL (ref 8–23)
CO2: 26 mmol/L (ref 22–32)
Calcium: 9.1 mg/dL (ref 8.9–10.3)
Chloride: 104 mmol/L (ref 98–111)
Creatinine, Ser: 0.91 mg/dL (ref 0.44–1.00)
GFR calc Af Amer: >60 mL/min (ref >60)
GFR calc non Af Amer: >60 mL/min (ref >60)
Glucose, Bld: 179 mg/dL — ABNORMAL HIGH (ref 70–99)
Potassium: 4 mmol/L (ref 3.5–5.1)
Sodium: 139 mmol/L (ref 135–145)
Total Bilirubin: 1 mg/dL (ref 0.3–1.2)
Total Protein: 6.8 g/dL (ref 6.5–8.1)

## 2019-07-31 LAB — CBC
HCT: 47.3 % — ABNORMAL HIGH (ref 36.0–46.0)
Hemoglobin: 15 g/dL (ref 12.0–15.0)
MCH: 29.9 pg (ref 26.0–34.0)
MCHC: 31.7 g/dL (ref 30.0–36.0)
MCV: 94.2 fL (ref 80.0–100.0)
Platelets: 254 10*3/uL (ref 150–400)
RBC: 5.02 MIL/uL (ref 3.87–5.11)
RDW: 13.4 % (ref 11.5–15.5)
WBC: 3.7 10*3/uL — ABNORMAL LOW (ref 4.0–10.5)
nRBC: 0 % (ref 0.0–0.2)

## 2019-07-31 MED ORDER — ADENOSINE 6 MG/2ML IV SOLN
6.0000 mg | Freq: Once | INTRAVENOUS | Status: AC
  Filled 2019-07-31: qty 2

## 2019-07-31 MED ORDER — TRAMADOL HCL 50 MG PO TABS: 50.0000 mg | ORAL_TABLET | Freq: Four times a day (QID) | ORAL | Status: DC | PRN

## 2019-07-31 MED ORDER — METOPROLOL TARTRATE 25 MG PO TABS: 25.0000 mg | ORAL_TABLET | Freq: Every day | ORAL | Status: DC | PRN

## 2019-07-31 MED ORDER — ENOXAPARIN SODIUM 40 MG/0.4ML ~~LOC~~ SOLN
40.0000 mg | SUBCUTANEOUS | Status: DC
  Administered 2019-07-31: 40 mg via SUBCUTANEOUS
  Filled 2019-07-31: qty 0.4

## 2019-07-31 MED ORDER — ADENOSINE 12 MG/4ML IV SOLN
INTRAVENOUS | Status: AC
  Administered 2019-07-31: 6 mg via INTRAVENOUS
  Filled 2019-07-31: qty 4

## 2019-07-31 MED ORDER — ONDANSETRON HCL 4 MG PO TABS: 4.0000 mg | ORAL_TABLET | Freq: Four times a day (QID) | ORAL | Status: DC | PRN

## 2019-07-31 MED ORDER — SODIUM CHLORIDE 0.9 % IV BOLUS
1000.0000 mL | Freq: Once | INTRAVENOUS | Status: AC
  Administered 2019-07-31: 14:00:00 1000 mL via INTRAVENOUS

## 2019-07-31 MED ORDER — ACETAMINOPHEN 650 MG RE SUPP: 650.0000 mg | Freq: Four times a day (QID) | RECTAL | Status: DC | PRN

## 2019-07-31 MED ORDER — ONDANSETRON HCL 4 MG/2ML IJ SOLN: 4.0000 mg | Freq: Four times a day (QID) | INTRAMUSCULAR | Status: DC | PRN

## 2019-07-31 MED ORDER — SODIUM CHLORIDE 0.9% FLUSH
3.0000 mL | Freq: Two times a day (BID) | INTRAVENOUS | Status: DC
  Administered 2019-07-31 – 2019-08-01 (×2): 3 mL via INTRAVENOUS

## 2019-07-31 MED ORDER — PANTOPRAZOLE SODIUM 40 MG PO TBEC
40.0000 mg | DELAYED_RELEASE_TABLET | Freq: Every day | ORAL | Status: DC
  Administered 2019-08-01: 10:00:00 40 mg via ORAL
  Filled 2019-07-31: qty 1

## 2019-07-31 MED ORDER — ACETAMINOPHEN 325 MG PO TABS: 650.0000 mg | ORAL_TABLET | Freq: Four times a day (QID) | ORAL | Status: DC | PRN

## 2019-07-31 MED ORDER — SODIUM CHLORIDE 0.9% FLUSH: 3.0000 mL | Freq: Once | INTRAVENOUS | Status: DC

## 2019-07-31 MED ORDER — BISACODYL 5 MG PO TBEC: 5.0000 mg | DELAYED_RELEASE_TABLET | Freq: Every day | ORAL | Status: DC | PRN

## 2019-07-31 NOTE — ED Notes (Signed)
Attempted to call receiving nurse. Was unable at this time.

## 2019-07-31 NOTE — ED Notes (Signed)
1400. Dr Alfred Levins, PA myself and Gearldine Bienenstock RN at bedside for assessement and intervention.  Pt placed on zoll defib pads and bedside cardiac monitor in SVT at this time.  Given 6 mg adenosine with 20CC rapid saline flush.  Pt tolerate well and converted to sinus rhythm afterwards.  Repeat EKG obtained and VS cycled.  Will continue to monitor/reassess.

## 2019-07-31 NOTE — ED Notes (Signed)
Call bell light answered pt asking about alternatives to admission given the delay in rooming; admission team to be notified

## 2019-07-31 NOTE — ED Provider Notes (Signed)
[facility] Emergency Department Provider Note  ____________________________________________  Time seen: Approximately 1:48 PM  I have reviewed the triage vital signs and the nursing notes.   HISTORY  Chief Complaint Tachycardia    HPI Kristy Mejia is a 74 y.o. female who presents the emergency department for complaint of possible SVT.  Patient states that she has a history of SVT, was treated once in 2016 with adenosine with good success.  Patient states that she will have paroxysmal periods but it has never lasted more than 30 minutes.  Patient states that she woke up this morning, felt like her heart was racing.  She has had no other symptoms to include fevers or chills, nasal congestion, URI symptoms, coughing, shortness of breath.  Patient denies any chest pain.  She has no history of cardiac issues other than SVT.  Patient states that she has tried multiple different vagal maneuvers at home without success of breaking her palpitations.  Patient states that again she was treated once  with adenosine but has never had cardioversion.  Patient states that she had seen her cardiologist in the past week, had a stress test which was reassuring.        Past Medical History:  Diagnosis Date  . Arthritis   . Atypical chest pain    a. 04/2015 Myoview: EF 78%, breast attenuation, no ischemia-->Low risk.  . Back pain    scoliosis  . Chicken pox   . Dysrhythmia    hx palpitations  . GERD (gastroesophageal reflux disease)   . History of hiatal hernia    noted on cxr  . Hyperlipidemia   . Joint pain   . Obesity, unspecified   . Paroxysmal SVT (supraventricular tachycardia) (HCC)    a. 2013 Holter: PACs/PVCs; b. 10/2011 Ehco: EF nl, no rwma, mild LVH, mild TR, PASP ; c 04/2015 SVT in ED->resolved with adenosine; c.   . Pneumonia 2010   hx of  . Scoliosis    multiple areas of back follows with Dr. Dyke Maes chiropractor     Patient Active Problem List    Diagnosis Date Noted  . Pain due to onychomycosis of toenails of both feet 06/16/2019  . Corns and callosities 06/16/2019  . Abnormal glucose level 10/20/2018  . Menopausal syndrome 10/20/2018  . Obesity 10/20/2018  . Shoulder pain 10/20/2018  . Hot flashes 09/28/2018  . Leukopenia 05/21/2018  . Duodenitis 05/21/2018  . Hiatal hernia 01/04/2018  . Microscopic hematuria 01/04/2018  . Primary osteoarthritis of right knee 10/30/2017  . Osteoarthritis of right knee 10/27/2017  . Knee pain, bilateral 09/18/2017  . Hyperlipidemia 04/21/2017  . Paroxysmal supraventricular tachycardia (HCC) 04/24/2015  . Prediabetes 04/24/2015  . Status post revision of total hip 04/17/2014  . 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 Family history of breast cancer 06/22/2013  . Diffuse cystic mastopathy 06/20/2013  . Palpitations 09/25/2011  . Dyspnea 09/25/2011    Past Surgical History:  Procedure Laterality Date  . ABDOMINAL HYSTERECTOMY     1990 ovaries intact. had 2/2 fibroids last pap 2004 neg.  ? if cervix present   . BACK SURGERY  1960's   d/t scoliosis-1964/65?  Marland Kitchen BACK SURGERY     x 3   . BREAST BIOPSY Left 2008   CORE W/CLIP - NEG  . BREAST BIOPSY     2000   . BREAST EXCISIONAL BIOPSY Right 20 + yrs ago  . BREAST SURGERY  1990   excision breast mass  .  COLONOSCOPY  2007   Dr. Mechele Collin  . COLONOSCOPY WITH ESOPHAGOGASTRODUODENOSCOPY (EGD) AND ESOPHAGEAL DILATION (ED)    . COLONOSCOPY WITH PROPOFOL N/A 03/22/2018   Procedure: COLONOSCOPY WITH PROPOFOL;  Surgeon: Scot Jun, MD;  Location: [facility] ENDOSCOPY;  Service: Endoscopy;  Laterality: N/A;  . ESOPHAGOGASTRODUODENOSCOPY (EGD) WITH PROPOFOL N/A 03/22/2018   Procedure: ESOPHAGOGASTRODUODENOSCOPY (EGD) WITH PROPOFOL;  Surgeon: Scot Jun, MD;  Location: [facility] ENDOSCOPY;  Service: Endoscopy;  Laterality: N/A;  . EYE SURGERY  2007   cataracts  . FOOT SURGERY  1960's  . JOINT REPLACEMENT     total hip left with metal  hardward 2016 Dr. Alphonsa Gin ortho   . OTHER SURGICAL HISTORY  2004   Bilateral cataracts  . PARTIAL HYSTERECTOMY  1990  . REVISION TOTAL HIP ARTHROPLASTY Left 04/17/2014   DR Turner Daniels  . TOTAL HIP ARTHROPLASTY  2006  . TOTAL HIP REVISION Left 04/17/2014   Procedure: LEFT TOTAL HIP REVISION;  Surgeon: Nestor Lewandowsky, MD;  Location: [facility] OR;  Service: Orthopedics;  Laterality: Left;  . TOTAL KNEE ARTHROPLASTY Right 10/30/2017   Procedure: RIGHT TOTAL KNEE ARTHROPLASTY;  Surgeon: Gean Birchwood, MD;  Location: [facility] OR;  Service: Orthopedics;  Laterality: Right;    Prior to Admission medications   Medication Sig Start Date End Date Taking? Authorizing Provider  aspirin EC 81 MG tablet Take 1 tablet (81 mg total) by mouth 2 (two) times daily. Patient taking differently: Take 81 mg by mouth daily.  10/30/17   Allena Katz, PA-C  meloxicam (MOBIC) 15 MG tablet Take 15 mg by mouth daily as needed.  09/20/18   [provider]  metoprolol tartrate (LOPRESSOR) 25 MG tablet Take 1 tablet (25 mg total) by mouth daily as needed. 07/12/19   Iran Ouch, MD  omeprazole (PRILOSEC) 40 MG capsule Take 40 mg by mouth daily.  09/23/18   [provider]    Allergies Diltiazem, Metoprolol succinate [metoprolol], and Tape  Family History  Problem Relation Age of Onset  . Heart attack Father   . Heart disease Father        MI  . Diabetes Father   . Hypertension Father   . Early death Father   . Hyperlipidemia Father   . Cancer Mother        breast cancer  . Breast cancer Mother 28  . Arthritis Mother   . Dementia Mother   . Aneurysm Brother        cardiac   . Early death Brother   . Colon cancer Cousin     Social History Social History   Tobacco Use  . Smoking status: Never Smoker  . Smokeless tobacco: Never Used  Substance Use Topics  . Alcohol use: Not Currently    Comment: rare  . Drug use: No     Review of Systems  Constitutional: No fever/chills Eyes: No visual  changes. No discharge ENT: No upper respiratory complaints. Cardiovascular: no chest pain.  Positive for palpitations/possible SVT Respiratory: no cough. No SOB. Gastrointestinal: No abdominal pain.  No nausea, no vomiting.  No diarrhea.  No constipation. Musculoskeletal: Negative for musculoskeletal pain. Skin: Negative for rash, abrasions, lacerations, ecchymosis. Neurological: Negative for headaches, focal weakness or numbness. 10-point ROS otherwise negative.  ____________________________________________   PHYSICAL EXAM:  VITAL SIGNS: ED Triage Vitals [07/31/19 1342]  Enc Vitals Group     BP (!) 87/56     Pulse Rate (!) 142     Resp 16  Temp 97.6 F (36.4 C)     Temp Source Oral     SpO2 96 %     Weight 180 lb (81.6 kg)     Height      Head Circumference      Peak Flow      Pain Score 0     Pain Loc      Pain Edu?      Excl. in GC?      Constitutional: Alert and oriented. Well appearing and in no acute distress. Eyes: Conjunctivae are normal. PERRL. EOMI. Head: Atraumatic. ENT:      Ears:       Nose: No congestion/rhinnorhea.      Mouth/Throat: Mucous membranes are moist.  Neck: No stridor.  Neck is supple full range of motion Hematological/Lymphatic/Immunilogical: No cervical lymphadenopathy. Cardiovascular: Rapid rate consistent with 140s on auscultation.  regular rhythm. Normal S1 and S2.  Good peripheral circulation. Respiratory: Normal respiratory effort without tachypnea or retractions. Lungs CTAB. Good air entry to the bases with no decreased or absent breath sounds. Musculoskeletal: Full range of motion to all extremities. No gross deformities appreciated. Neurologic:  Normal speech and language. No gross focal neurologic deficits are appreciated.  Skin:  Skin is warm, dry and intact. No rash noted. Psychiatric: Mood and affect are normal. Speech and behavior are normal. Patient exhibits appropriate insight and  judgement.   ____________________________________________   LABS (all labs ordered are listed, but only abnormal results are displayed)  Labs Reviewed  CBC - Abnormal; Notable for the following components:      Result Value   WBC 3.7 (*)    HCT 47.3 (*)    All other components within normal limits  COMPREHENSIVE METABOLIC PANEL - Abnormal; Notable for the following components:   Glucose, Bld 179 (*)    AST 57 (*)    All other components within normal limits  TROPONIN I (HIGH SENSITIVITY) - Abnormal; Notable for the following components:   Troponin I (High Sensitivity) 46 (*)    All other components within normal limits  SARS CORONAVIRUS 2 (TAT 6-24 HRS)  TROPONIN I (HIGH SENSITIVITY)   ____________________________________________  EKG   ____________________________________________  RADIOLOGY I personally viewed and evaluated these images as part of my medical decision making, as well as reviewing the written report by the radiologist.  DG Chest Port 1 View  Result Date: 07/31/2019 CLINICAL DATA:  Tachycardia. EXAM: PORTABLE CHEST 1 VIEW COMPARISON:  10/20/2017 chest radiograph FINDINGS: The cardiomediastinal silhouette is unchanged, with a large hiatal hernia again noted. Mild pulmonary vascular congestion is present. There is no evidence of focal airspace disease, pulmonary edema, suspicious pulmonary nodule/mass, pleural effusion, or pneumothorax. No acute bony abnormalities are identified. IMPRESSION: Mild pulmonary vascular congestion. Large hiatal hernia. Electronically Signed   By: Harmon Pier M.D.   On: 07/31/2019 14:34    ____________________________________________    PROCEDURES  Procedure(s) performed:    .Critical Care Performed by: Racheal Patches, PA-C Authorized by: Racheal Patches, PA-C   Critical care provider statement:    Critical care time (minutes):  30   Critical care time was exclusive of:  Separately billable procedures and  treating other patients   Critical care was necessary to treat or prevent imminent or life-threatening deterioration of the following conditions:  Circulatory failure   Critical care was time spent personally by me on the following activities:  Obtaining history from patient or surrogate, development of treatment plan with patient or surrogate,  examination of patient, ordering and performing treatments and interventions, ordering and review of laboratory studies, ordering and review of radiographic studies, re-evaluation of patient's condition, evaluation of patient's response to treatment and review of old charts      Medications  sodium chloride flush (NS) 0.9 % injection 3 mL (has no administration in time range)  sodium chloride 0.9 % bolus 1,000 mL (0 mLs Intravenous Stopped 07/31/19 1400)  adenosine (ADENOCARD) 6 MG/2ML injection 6 mg (6 mg Intravenous Given 07/31/19 1405)     ____________________________________________   INITIAL IMPRESSION / ASSESSMENT AND PLAN / ED COURSE  Pertinent labs & imaging results that were available during my care of the patient were reviewed by me and considered in my medical decision making (see chart for details).  Review of the [place] CSRS was performed in accordance of the [place] prior to dispensing any controlled drugs.  Clinical Course as of Jul 30 1737  Sun Jul 31, 2019  1725 Patient presented to emergency department complaining of possible SVT.  Initial EKG confirms SVT.  Patient was slightly hypertensive, but not complaining of any chest pain.  At this time it was felt that patient would be suitable for adenosine administration without immediate cardioversion.  Patient's previous prolonged SVT responded well to adenosine.  Patient was given 6 mg which resulted in good conversion.  Patient has remained in normal sinus rhythm with no return of SVT.  At this time, patient has been reassuring has had no other complaints.  Unfortunately patient's troponin was  trending up from 16-46.  At this time it is felt that patient would be best suited with observation.  We will contact the hospitalist service.   [JC]    Clinical Course User Index [JC] Cicley Ganesh, Charline Bills, PA-C          Patient's diagnosis is consistent with SVT.  Patient presented to emergency department complaining of palpitations concerning for SVT.  She has a history of same.  Patient was successfully converted with adenosine 5 years ago.  This was the last time that she had a prolonged period of SVT.  Patient tried vagal maneuvers at home.  These were unsuccessful.  Patient had 1 dose of 6 mg of adenosine with good conversion.  Patient has been relatively asymptomatic other than complaining of a rapid heart rate.  There have been no return of SVT after initial conversion.  Patient's troponin trended up by 30 points from 16-46.  At this point feel that the patient be best suited with observation.  Hospitalist is contacted and will accept the patient in admission.     ____________________________________________  FINAL CLINICAL IMPRESSION(S) / ED DIAGNOSES  Final diagnoses:  SVT (supraventricular tachycardia) ([place])      NEW MEDICATIONS STARTED DURING THIS VISIT:  ED Discharge Orders    None          This chart was dictated using voice recognition software/Dragon. Despite best efforts to proofread, errors can occur which can change the meaning. Any change was purely unintentional.    Darletta Moll, PA-C 07/31/19 1739    Vanessa [place], MD 08/01/19 1525

## 2019-07-31 NOTE — H&P (Signed)
History and Physical    Kristy Mejia DGU:[id number] DOB: [birth year] DOA: 07/31/2019  PCP: McLean-Scocuzza, Pasty Spillers, MD  Patient coming from: home     Chief Complaint: palpitations  HPI: 74 y/o F w/ PMH of SVT, GERD, who presents w/ palpitations x 11:40AM of day of admission. The palpitations occurred while the pt was laying in bed. Pt tried valsalva, coughing as well 75 mg of metoprolol without relief of the palpitations. Pt has had above stated symptoms in the past that resolved w/ metoprolol use. Of note, pt recently saw her cardiologist & had a cardiac stress test which was normal and an echo for which she does not know the results of yet. Pt's cardiologist is Dr. Kirke Corin. Also, pt was recently taken of metoprolol succinate secondary to it making her lethargic. Pt denies any past hx of MI, CVA, a.fib/flutter. Pt denies any dizziness, chest pain, shortness of breath, nausea, vomiting, cough, abd pain, dysuria, urinary frequency, urinary urgency, diarrhea or constipation.   Review of Systems: As per HPI otherwise 10 point review of systems negative.    Past Medical History:  Diagnosis Date  . Arthritis   . Atypical chest pain    a. 04/2015 Myoview: EF 78%, breast attenuation, no ischemia-->Low risk.  . Back pain    scoliosis  . Chicken pox   . Dysrhythmia    hx palpitations  . GERD (gastroesophageal reflux disease)   . History of hiatal hernia    noted on cxr  . Hyperlipidemia   . Joint pain   . Obesity, unspecified   . Paroxysmal SVT (supraventricular tachycardia) (HCC)    a. 2013 Holter: PACs/PVCs; b. 10/2011 Ehco: EF nl, no rwma, mild LVH, mild TR, PASP ; c 04/2015 SVT in ED->resolved with adenosine; c.   . Pneumonia 2010   hx of  . Scoliosis    multiple areas of back follows with Dr. Dyke Maes chiropractor     Past Surgical History:  Procedure Laterality Date  . ABDOMINAL HYSTERECTOMY     1990 ovaries intact. had 2/2 fibroids last pap 2004 neg.  ? if cervix present    . BACK SURGERY  1960's   d/t scoliosis-1964/65?  [facility] BACK SURGERY     x 3   . BREAST BIOPSY Left 2008   CORE W/CLIP - NEG  . BREAST BIOPSY     2000   . BREAST EXCISIONAL BIOPSY Right 20 + yrs ago  . BREAST SURGERY  1990   excision breast mass  . COLONOSCOPY  2007   Dr. Mechele Collin  . COLONOSCOPY WITH ESOPHAGOGASTRODUODENOSCOPY (EGD) AND ESOPHAGEAL DILATION (ED)    . COLONOSCOPY WITH PROPOFOL N/A 03/22/2018   Procedure: COLONOSCOPY WITH PROPOFOL;  Surgeon: Scot Jun, MD;  Location: [facility] ENDOSCOPY;  Service: Endoscopy;  Laterality: N/A;  . ESOPHAGOGASTRODUODENOSCOPY (EGD) WITH PROPOFOL N/A 03/22/2018   Procedure: ESOPHAGOGASTRODUODENOSCOPY (EGD) WITH PROPOFOL;  Surgeon: Scot Jun, MD;  Location: [facility] ENDOSCOPY;  Service: Endoscopy;  Laterality: N/A;  . EYE SURGERY  2007   cataracts  . FOOT SURGERY  1960's  . JOINT REPLACEMENT     total hip left with metal hardward 2016 Dr. Alphonsa Gin ortho   . OTHER SURGICAL HISTORY  2004   Bilateral cataracts  . PARTIAL HYSTERECTOMY  1990  . REVISION TOTAL HIP ARTHROPLASTY Left 04/17/2014   DR Turner Daniels  . TOTAL HIP ARTHROPLASTY  2006  . TOTAL HIP REVISION Left 04/17/2014   Procedure: LEFT TOTAL HIP REVISION;  Surgeon: Feliberto Gottron  Turner Daniels, MD;  Location: [facility] OR;  Service: Orthopedics;  Laterality: Left;  . TOTAL KNEE ARTHROPLASTY Right 10/30/2017   Procedure: RIGHT TOTAL KNEE ARTHROPLASTY;  Surgeon: Gean Birchwood, MD;  Location: [facility] OR;  Service: Orthopedics;  Laterality: Right;     reports that she has never smoked. She has never used smokeless tobacco. She reports previous alcohol use. She reports that she does not use drugs.  Allergies  Allergen Reactions  . Diltiazem Rash    CD formulation likely generalized drug rash/eruption face, trunk, arms   . Metoprolol Succinate [Metoprolol] Other (See Comments)    Succinate version causes extreme lethargy   . Tape Itching, Rash and Other (See Comments)    Reaction: blisters (adhesive  tape) Paper tape is ok    Family History  Problem Relation Age of Onset  . Heart attack Father   . Heart disease Father        MI  . Diabetes Father   . Hypertension Father   . Early death Father   . Hyperlipidemia Father   . Cancer Mother        breast cancer  . Breast cancer Mother 58  . Arthritis Mother   . Dementia Mother   . Aneurysm Brother        cardiac   . Early death Brother   . Colon cancer Cousin      Prior to Admission medications   Medication Sig Start Date End Date Taking? Authorizing Provider  aspirin EC 81 MG tablet Take 1 tablet (81 mg total) by mouth 2 (two) times daily. Patient taking differently: Take 81 mg by mouth daily.  10/30/17   Allena Katz, PA-C  meloxicam (MOBIC) 15 MG tablet Take 15 mg by mouth daily as needed.  09/20/18   [provider]  metoprolol tartrate (LOPRESSOR) 25 MG tablet Take 1 tablet (25 mg total) by mouth daily as needed. 07/12/19   Iran Ouch, MD  omeprazole (PRILOSEC) 40 MG capsule Take 40 mg by mouth daily.  09/23/18   [provider]    Physical Exam: Vitals:   07/31/19 1400 07/31/19 1422 07/31/19 1500 07/31/19 1600  BP: 109/77 107/71 99/62 113/70  Pulse: 69 78 67 67  Resp: 18 18 18 16   Temp:      TempSrc:      SpO2: 99% 100% 100% 100%  Weight:        Constitutional: NAD, calm, comfortable Vitals:   07/31/19 1400 07/31/19 1422 07/31/19 1500 07/31/19 1600  BP: 109/77 107/71 99/62 113/70  Pulse: 69 78 67 67  Resp: 18 18 18 16   Temp:      TempSrc:      SpO2: 99% 100% 100% 100%  Weight:       Eyes: PERRL, lids and conjunctivae normal ENMT: Mucous membranes are moist. Posterior pharynx clear of any exudate or lesions. Neck: normal, supple,  Respiratory: clear to auscultation bilaterally, no wheezing, no crackles.No accessory muscle use.  Cardiovascular: S1, S2+. no  rubs / gallops. No extremity edema.  Abdomen: soft, no tenderness, ND. 08/02/19 Bowel sounds positive.  Musculoskeletal: no  cyanosis. No joint deformity upper and lower extremities. Good ROM. Normal muscle tone.  Skin: no rashes, lesions, ulcers.  Neurologic: CN 2-12 grossly intact. Strength 5/5 in all 4.  Psychiatric: Normal judgment and insight. Alert and oriented x 3. Normal mood.     Labs on Admission: I have personally reviewed following labs and imaging studies  CBC: Recent Labs  Lab  07/31/19 1352  WBC 3.7*  HGB 15.0  HCT 47.3*  MCV 94.2  PLT 573   Basic Metabolic Panel: Recent Labs  Lab 07/31/19 1350  NA 139  K 4.0  CL 104  CO2 26  GLUCOSE 179*  BUN 18  CREATININE 0.91  CALCIUM 9.1   GFR: Estimated Creatinine Clearance: 56.1 mL/min (by C-G formula based on SCr of 0.91 mg/dL). Liver Function Tests: Recent Labs  Lab 07/31/19 1350  AST 57*  ALT 34  ALKPHOS 93  BILITOT 1.0  PROT 6.8  ALBUMIN 3.9   No results for input(s): LIPASE, AMYLASE in the last 168 hours. No results for input(s): AMMONIA in the last 168 hours. Coagulation Profile: No results for input(s): INR, PROTIME in the last 168 hours. Cardiac Enzymes: No results for input(s): CKTOTAL, CKMB, CKMBINDEX, TROPONINI in the last 168 hours. BNP (last 3 results) No results for input(s): PROBNP in the last 8760 hours. HbA1C: No results for input(s): HGBA1C in the last 72 hours. CBG: No results for input(s): GLUCAP in the last 168 hours. Lipid Profile: No results for input(s): CHOL, HDL, LDLCALC, TRIG, CHOLHDL, LDLDIRECT in the last 72 hours. Thyroid Function Tests: No results for input(s): TSH, T4TOTAL, FREET4, T3FREE, THYROIDAB in the last 72 hours. Anemia Panel: No results for input(s): VITAMINB12, FOLATE, FERRITIN, TIBC, IRON, RETICCTPCT in the last 72 hours. Urine analysis:    Component Value Date/Time   COLORURINE YELLOW 10/20/2017 1105   APPEARANCEUR Clear 05/10/2018 0800   LABSPEC 1.017 10/20/2017 1105   PHURINE 6.0 10/20/2017 1105   GLUCOSEU Negative 05/10/2018 0800   GLUCOSEU NEGATIVE 04/21/2017 0841    HGBUR NEGATIVE 10/20/2017 1105   BILIRUBINUR Negative 05/10/2018 0800   KETONESUR NEGATIVE 10/20/2017 1105   PROTEINUR Negative 05/10/2018 0800   PROTEINUR NEGATIVE 10/20/2017 1105   UROBILINOGEN 0.2 01/04/2018 0830   UROBILINOGEN 0.2 04/21/2017 0841   NITRITE Negative 05/10/2018 0800   NITRITE NEGATIVE 10/20/2017 1105   LEUKOCYTESUR Negative 05/10/2018 0800    Radiological Exams on Admission: DG Chest Port 1 View  Result Date: 07/31/2019 CLINICAL DATA:  Tachycardia. EXAM: PORTABLE CHEST 1 VIEW COMPARISON:  10/20/2017 chest radiograph FINDINGS: The cardiomediastinal silhouette is unchanged, with a large hiatal hernia again noted. Mild pulmonary vascular congestion is present. There is no evidence of focal airspace disease, pulmonary edema, suspicious pulmonary nodule/mass, pleural effusion, or pneumothorax. No acute bony abnormalities are identified. IMPRESSION: Mild pulmonary vascular congestion. Large hiatal hernia. Electronically Signed   By: Margarette Canada M.D.   On: 07/31/2019 14:34    EKG: Independently reviewed.   Assessment/Plan Active Problems:   * No active hospital problems. *   SVT: resolved after adenosine given in the ER. Metoprolol tartrate prn. Continue on tele  Elevated troponins: likely secondary to SVT. Will order repeat troponin. Recent cardiac stress test was normal. No CP or shortness of breath  Hyperglycemia: no hx of DM. Will continue to monitor  Transaminitis: ALT is WNL, AST is slightly elevated. Will continue to monitor   DVT prophylaxis: lovenox Code Status: Full Family Communication:  Disposition Plan: d/c home tomorrow if stable  Consults called: none Admission status: observation    Wyvonnia Dusky MD [facility] Pager [phone]   If 7PM-7AM, please contact night-coverage [email or web address]  07/31/2019, 5:37 PM

## 2019-07-31 NOTE — ED Notes (Signed)
Call to [facility], RN, report for pt. Pt leaving att to go to floor

## 2019-07-31 NOTE — Progress Notes (Signed)
CRITICAL VALUE ALERT  Critical Value:  Troponin 83  Date & Time Notied:  07/31/2019 2147  Provider Notified: Manuela Schwartz  Orders Received/Actions taken: awaiting call back

## 2019-07-31 NOTE — ED Triage Notes (Signed)
Pt to ED via POV c/o tachycardia. Pt states that it started around 1140. Pt has hx/o SVT. Pt took Metoprolol and has tried vagal maneuvers without relief. Pt is hypotensive and tachycardic in triage.

## 2019-08-01 DIAGNOSIS — R739 Hyperglycemia, unspecified: Secondary | ICD-10-CM | POA: Diagnosis not present

## 2019-08-01 DIAGNOSIS — R7303 Prediabetes: Secondary | ICD-10-CM | POA: Diagnosis not present

## 2019-08-01 DIAGNOSIS — R778 Other specified abnormalities of plasma proteins: Secondary | ICD-10-CM | POA: Diagnosis not present

## 2019-08-01 DIAGNOSIS — I471 Supraventricular tachycardia: Secondary | ICD-10-CM | POA: Diagnosis not present

## 2019-08-01 LAB — CBC
HCT: 40.4 % (ref 36.0–46.0)
Hemoglobin: 13.5 g/dL (ref 12.0–15.0)
MCH: 31.1 pg (ref 26.0–34.0)
MCHC: 33.4 g/dL (ref 30.0–36.0)
MCV: 93.1 fL (ref 80.0–100.0)
Platelets: 208 10*3/uL (ref 150–400)
RBC: 4.34 MIL/uL (ref 3.87–5.11)
RDW: 13.3 % (ref 11.5–15.5)
WBC: 4.8 10*3/uL (ref 4.0–10.5)
nRBC: 0 % (ref 0.0–0.2)

## 2019-08-01 LAB — BASIC METABOLIC PANEL
Anion gap: 5 (ref 5–15)
BUN: 20 mg/dL (ref 8–23)
CO2: 26 mmol/L (ref 22–32)
Calcium: 8.5 mg/dL — ABNORMAL LOW (ref 8.9–10.3)
Chloride: 109 mmol/L (ref 98–111)
Creatinine, Ser: 0.64 mg/dL (ref 0.44–1.00)
GFR calc Af Amer: >60 mL/min (ref >60)
GFR calc non Af Amer: >60 mL/min (ref >60)
Glucose, Bld: 142 mg/dL — ABNORMAL HIGH (ref 70–99)
Potassium: 3.8 mmol/L (ref 3.5–5.1)
Sodium: 140 mmol/L (ref 135–145)

## 2019-08-01 LAB — HEMOGLOBIN A1C
Hgb A1c MFr Bld: 5.7 % — ABNORMAL HIGH (ref 4.8–5.6)
Mean Plasma Glucose: 116.89 mg/dL

## 2019-08-01 LAB — TROPONIN I (HIGH SENSITIVITY)
Troponin I (High Sensitivity): 77 ng/L — ABNORMAL HIGH (ref <18)
Troponin I (High Sensitivity): 66 ng/L — ABNORMAL HIGH (ref <18)

## 2019-08-01 LAB — SARS CORONAVIRUS 2 (TAT 6-24 HRS): SARS Coronavirus 2: NEGATIVE

## 2019-08-01 LAB — TSH: TSH: 1.875 u[IU]/mL (ref 0.350–4.500)

## 2019-08-01 MED ORDER — MAGNESIUM OXIDE 400 (241.3 MG) MG PO TABS
200.0000 mg | ORAL_TABLET | ORAL | Status: AC
  Administered 2019-08-01: 200 mg via ORAL
  Filled 2019-08-01: qty 1

## 2019-08-01 NOTE — Discharge Summary (Signed)
Physician Discharge Summary  Kristy Mejia HER:[id number] DOB: [birth year] DOA: 07/31/2019  PCP: McLean-Scocuzza, Pasty Spillers, MD  Admit date: 07/31/2019 Discharge date: 08/01/2019  Admitted From: home Disposition: home  Recommendations for Outpatient Follow-up:  1. Follow up with PCP in 1-2 weeks 2. F/u cardio in 1-2 weeks  Home Health: no Equipment/Devices:  Discharge Condition: stable CODE STATUS: full Diet recommendation: Heart Healthy   Brief/Interim Summary: 74 y/o F w/ PMH of SVT, GERD, who presents w/ palpitations x 11:40AM of day of admission. The palpitations occurred while the pt was laying in bed. Pt tried valsalva, coughing as well 75 mg of metoprolol without relief of the palpitations. Pt has had above stated symptoms in the past that resolved w/ metoprolol use. Of note, pt recently saw her cardiologist & had a cardiac stress test which was normal and an echo for which she does not know the results of yet. Pt's cardiologist is Dr. Kirke Corin. Also, pt was recently taken of metoprolol succinate secondary to it making her lethargic. Pt denies any past hx of MI, CVA, a.fib/flutter. Pt denies any dizziness, chest pain, shortness of breath, nausea, vomiting, cough, abd pain, dysuria, urinary frequency, urinary urgency, diarrhea or constipation.   The SVT resolved in the ER after adenosine was given and it did not reoccur. Troponins were initially elevated but then started to trend down which were likely secondary to SVT. Pt recently had a cardiac stress test outpatient which was normal. Pt has f/u appt w/ her cardiologist, Dr. Kirke Corin, in 2 weeks.   Discharge Diagnoses:  Active Problems:   SVT (supraventricular tachycardia) (HCC)  SVT: resolved after adenosine given in the ER. Metoprolol tartrate prn. Continue on tele  Elevated troponins: likely secondary to SVT. Trending down. Recent cardiac stress test was normal. No CP or shortness of breath  Pre-DM: w/ HbA1c 5.7. Will continue to  monitor  Transaminitis: ALT is WNL, AST is slightly elevated. Will continue to monitor    Discharge Instructions  Discharge Instructions    Diet general   Complete by: As directed    Discharge instructions   Complete by: As directed    F/u PCP in 1-2 weeks; F/u cardio in 1-2 weeks   Increase activity slowly   Complete by: As directed      Allergies as of 08/01/2019      Reactions   Diltiazem Rash   CD formulation likely generalized drug rash/eruption face, trunk, arms    Metoprolol Succinate [metoprolol] Other (See Comments)   Succinate version causes extreme lethargy    Tape Itching, Rash, Other (See Comments)   Reaction: blisters (adhesive tape) Paper tape is ok      Medication List    TAKE these medications   aspirin EC 81 MG tablet Take 1 tablet (81 mg total) by mouth 2 (two) times daily. What changed: when to take this Notes to patient: Today when home.    meloxicam 15 MG tablet Commonly known as: MOBIC Take 15 mg by mouth daily as needed.   metoprolol tartrate 25 MG tablet Commonly known as: LOPRESSOR Take 1 tablet (25 mg total) by mouth daily as needed.       Allergies  Allergen Reactions  . Diltiazem Rash    CD formulation likely generalized drug rash/eruption face, trunk, arms   . Metoprolol Succinate [Metoprolol] Other (See Comments)    Succinate version causes extreme lethargy   . Tape Itching, Rash and Other (See Comments)    Reaction: blisters (adhesive tape) Paper  tape is ok    Consultations:  n/a   Procedures/Studies: NM Myocar Multi W/Spect W/Wall Motion / EF  Result Date: 07/20/2019  There was no ST segment deviation noted during stress.  There is no evidence for ischemia  The study is normal.  This is a low risk study.  The left ventricular ejection fraction is normal (52%).    DG Chest Port 1 View  Result Date: 07/31/2019 CLINICAL DATA:  Tachycardia. EXAM: PORTABLE CHEST 1 VIEW COMPARISON:  10/20/2017 chest radiograph  FINDINGS: The cardiomediastinal silhouette is unchanged, with a large hiatal hernia again noted. Mild pulmonary vascular congestion is present. There is no evidence of focal airspace disease, pulmonary edema, suspicious pulmonary nodule/mass, pleural effusion, or pneumothorax. No acute bony abnormalities are identified. IMPRESSION: Mild pulmonary vascular congestion. Large hiatal hernia. Electronically Signed   By: Harmon Pier M.D.   On: 07/31/2019 14:34   ECHOCARDIOGRAM COMPLETE  Result Date: 07/29/2019    ECHOCARDIOGRAM REPORT   Patient Name:   Kristy Mejia Date of Exam: 07/29/2019 Medical Rec #:  [id number]    Height:       62.0 in Accession #:    [id number]   Weight:       185.5 lb Date of Birth:  [birth year]   BSA:          1.852 m Patient Age:    74 years     BP:           150/86 mmHg Patient Gender: F            HR:           72 bpm. Exam Location:  [facility] Procedure: 2D Echo, Cardiac Doppler and Color Doppler Indications:    R06.02 SOB  History:        Patient has prior history of Echocardiogram examinations, most                 recent 10/27/2011. Arrythmias:SVT, Signs/Symptoms:Shortness of                 Breath and heaviness; Risk Factors:Dyslipidemia and Non-Smoker.                 Patient has been complaining of a chest heaviness with SOB since                 01/2019. She also has episodes of rapid heart rate.  Sonographer:    Carlos American RVT, RDCS (AE), RDMS Referring Phys: [id number] MUHAMMAD A ARIDA IMPRESSIONS  1. Left ventricular ejection fraction, by estimation, is 60 to 65%. The left ventricle has normal function. The left ventricle has no regional wall motion abnormalities. Left ventricular diastolic parameters are consistent with Grade I diastolic dysfunction (impaired relaxation).  2. Right ventricular systolic function is normal. The right ventricular size is normal. There is mildly elevated pulmonary artery systolic pressure. The estimated right ventricular systolic pressure is 34.8 mmHg.   3. Mild mitral valve regurgitation.  4. Tricuspid valve regurgitation is mild to moderate. Comparison(s): Normal EF with mild LVH, trivial MR, trivial PI, mild TR. FINDINGS  Left Ventricle: Left ventricular ejection fraction, by estimation, is 60 to 65%. The left ventricle has normal function. The left ventricle has no regional wall motion abnormalities. The left ventricular internal cavity size was normal in size. There is  borderline left ventricular hypertrophy. Left ventricular diastolic parameters are consistent with Grade I diastolic dysfunction (impaired relaxation). Right Ventricle: The right ventricular size is normal. No increase  in right ventricular wall thickness. Right ventricular systolic function is normal. There is mildly elevated pulmonary artery systolic pressure. The tricuspid regurgitant velocity is 2.49  m/s, and with an assumed right atrial pressure of 10 mmHg, the estimated right ventricular systolic pressure is 34.8 mmHg. Left Atrium: Left atrial size was normal in size. Right Atrium: Right atrial size was normal in size. Pericardium: There is no evidence of pericardial effusion. Mitral Valve: The mitral valve is normal in structure. Normal mobility of the mitral valve leaflets. Mild mitral valve regurgitation. No evidence of mitral valve stenosis. Tricuspid Valve: The tricuspid valve is normal in structure. Tricuspid valve regurgitation is mild to moderate. No evidence of tricuspid stenosis. Aortic Valve: The aortic valve is normal in structure. Aortic valve regurgitation is not visualized. No aortic stenosis is present. Pulmonic Valve: The pulmonic valve was normal in structure. Pulmonic valve regurgitation is trivial. No evidence of pulmonic stenosis. Aorta: The aortic root is normal in size and structure. Venous: The inferior vena cava is normal in size with greater than 50% respiratory variability, suggesting right atrial pressure of 3 mmHg. IAS/Shunts: No atrial level shunt detected by  color flow Doppler.  LEFT VENTRICLE PLAX 2D LVIDd:         3.53 cm     Diastology LVIDs:         1.56 cm     LV e' lateral:   6.53 cm/s LV PW:         0.82 cm     LV E/e' lateral: 17.3 LV IVS:        0.92 cm     LV e' medial:    6.53 cm/s LVOT diam:     1.95 cm     LV E/e' medial:  17.3 LV SV:         59 LV SV Index:   32 LVOT Area:     2.99 cm  LV Volumes (MOD) LV vol d, MOD A2C: 45.0 ml LV vol d, MOD A4C: 44.0 ml LV vol s, MOD A2C: 17.0 ml LV vol s, MOD A4C: 15.0 ml LV SV MOD A2C:     28.0 ml LV SV MOD A4C:     44.0 ml LV SV MOD BP:      28.6 ml LEFT ATRIUM           Index LA diam:      2.60 cm 1.40 cm/m LA Vol (A2C):         26.50 ml/m LA Vol (A4C):         17.30 ml/m  AORTIC VALVE             PULMONIC VALVE LVOT Vmax:   99.20 cm/s  PV Vmax:       0.62 m/s LVOT Vmean:  69.400 cm/s PV Peak grad:  1.5 mmHg LVOT VTI:    0.198 m  AORTA Ao Root diam: 3.10 cm Ao Asc diam:  3.10 cm MITRAL VALVE                TRICUSPID VALVE MV Area (PHT): 3.60 cm     TV Peak grad:   22.1 mmHg MV Decel Time: 211 msec     TV Vmax:        2.35 m/s MV E velocity: 113.00 cm/s  TR Peak grad:   24.8 mmHg MV A velocity: 110.00 cm/s  TR Vmax:        249.00 cm/s MV E/A ratio:  1.03  SHUNTS                             Systemic VTI:  0.20 m                             Systemic Diam: 1.95 cm Ida Rogue MD Electronically signed by Ida Rogue MD Signature Date/Time: 07/29/2019/7:22:03 PM    Final      Subjective: Pt denies any complaints   Discharge Exam: Vitals:   08/01/19 0721 08/01/19 1211  BP: 118/67 111/61  Pulse: 60 63  Resp: 16 17  Temp: (!) 97.5 F (36.4 C) 98.5 F (36.9 C)  SpO2: 98% 100%   Vitals:   07/31/19 2034 08/01/19 0355 08/01/19 0721 08/01/19 1211  BP: (!) 141/90 116/68 118/67 111/61  Pulse: (!) 54 63 60 63  Resp: 20 18 16 17   Temp: 98.4 F (36.9 C) 98.4 F (36.9 C) (!) 97.5 F (36.4 C) 98.5 F (36.9 C)  TempSrc: Oral Oral Oral Oral  SpO2: 99% 98% 98% 100%  Weight:  83.3 kg 82.9 kg    Height: 5\' 2"  (1.575 m)       General: Pt is alert, awake, not in acute distress Cardiovascular: S1/S2 +, no rubs, no gallops Respiratory: CTA bilaterally, no wheezing, no rhonchi Abdominal: Soft, NT, ND, bowel sounds + Extremities: no edema, no cyanosis    The results of significant diagnostics from this hospitalization (including imaging, microbiology, ancillary and laboratory) are listed below for reference.     Microbiology: Recent Results (from the past 240 hour(s))  SARS CORONAVIRUS 2 (TAT 6-24 HRS) Nasopharyngeal Nasopharyngeal Swab     Status: None   Collection Time: 07/31/19  6:10 PM   Specimen: Nasopharyngeal Swab  Result Value Ref Range Status   SARS Coronavirus 2 NEGATIVE NEGATIVE Final    Comment: (NOTE) SARS-CoV-2 target nucleic acids are NOT DETECTED. The SARS-CoV-2 RNA is generally detectable in upper and lower respiratory specimens during the acute phase of infection. Negative results do not preclude SARS-CoV-2 infection, do not rule out co-infections with other pathogens, and should not be used as the sole basis for treatment or other patient management decisions. Negative results must be combined with clinical observations, patient history, and epidemiological information. The expected result is Negative. Fact Sheet for Patients: SugarRoll.be Fact Sheet for Healthcare Providers: https://www.woods-mathews.com/ This test is not yet approved or cleared by the [place] FDA and  has been authorized for detection and/or diagnosis of SARS-CoV-2 by FDA under an Emergency Use Authorization (EUA). This EUA will remain  in effect (meaning this test can be used) for the duration of the COVID-19 declaration under Section 56 4(b)(1) of the Act, 21 U.S.C. section 360bbb-3(b)(1), unless the authorization is terminated or revoked sooner. Performed at [facility], [place] [address] [place],   [address]      Labs: BNP (last 3 results) No results for input(s): BNP in the last 8760 hours. Basic Metabolic Panel: Recent Labs  Lab 07/31/19 1350 08/01/19 0142  NA 139 140  K 4.0 3.8  CL 104 109  CO2 26 26  GLUCOSE 179* 142*  BUN 18 20  CREATININE 0.91 0.64  CALCIUM 9.1 8.5*   Liver Function Tests: Recent Labs  Lab 07/31/19 1350  AST 57*  ALT 34  ALKPHOS 93  BILITOT 1.0  PROT 6.8  ALBUMIN 3.9   No results for  input(s): LIPASE, AMYLASE in the last 168 hours. No results for input(s): AMMONIA in the last 168 hours. CBC: Recent Labs  Lab 07/31/19 1352 08/01/19 0142  WBC 3.7* 4.8  HGB 15.0 13.5  HCT 47.3* 40.4  MCV 94.2 93.1  PLT 254 208   Cardiac Enzymes: No results for input(s): CKTOTAL, CKMB, CKMBINDEX, TROPONINI in the last 168 hours. BNP: Invalid input(s): POCBNP CBG: No results for input(s): GLUCAP in the last 168 hours. D-Dimer No results for input(s): DDIMER in the last 72 hours. Hgb A1c Recent Labs    08/01/19 0142  HGBA1C 5.7*   Lipid Profile No results for input(s): CHOL, HDL, LDLCALC, TRIG, CHOLHDL, LDLDIRECT in the last 72 hours. Thyroid function studies Recent Labs    08/01/19 0142  TSH 1.875   Anemia work up No results for input(s): VITAMINB12, FOLATE, FERRITIN, TIBC, IRON, RETICCTPCT in the last 72 hours. Urinalysis    Component Value Date/Time   COLORURINE YELLOW 10/20/2017 1105   APPEARANCEUR Clear 05/10/2018 0800   LABSPEC 1.017 10/20/2017 1105   PHURINE 6.0 10/20/2017 1105   GLUCOSEU Negative 05/10/2018 0800   GLUCOSEU NEGATIVE 04/21/2017 0841   HGBUR NEGATIVE 10/20/2017 1105   BILIRUBINUR Negative 05/10/2018 0800   KETONESUR NEGATIVE 10/20/2017 1105   PROTEINUR Negative 05/10/2018 0800   PROTEINUR NEGATIVE 10/20/2017 1105   UROBILINOGEN 0.2 01/04/2018 0830   UROBILINOGEN 0.2 04/21/2017 0841   NITRITE Negative 05/10/2018 0800   NITRITE NEGATIVE 10/20/2017 1105   LEUKOCYTESUR Negative 05/10/2018 0800   Sepsis  Labs Invalid input(s): PROCALCITONIN,  WBC,  LACTICIDVEN Microbiology Recent Results (from the past 240 hour(s))  SARS CORONAVIRUS 2 (TAT 6-24 HRS) Nasopharyngeal Nasopharyngeal Swab     Status: None   Collection Time: 07/31/19  6:10 PM   Specimen: Nasopharyngeal Swab  Result Value Ref Range Status   SARS Coronavirus 2 NEGATIVE NEGATIVE Final    Comment: (NOTE) SARS-CoV-2 target nucleic acids are NOT DETECTED. The SARS-CoV-2 RNA is generally detectable in upper and lower respiratory specimens during the acute phase of infection. Negative results do not preclude SARS-CoV-2 infection, do not rule out co-infections with other pathogens, and should not be used as the sole basis for treatment or other patient management decisions. Negative results must be combined with clinical observations, patient history, and epidemiological information. The expected result is Negative. Fact Sheet for Patients: HairSlick.nohttps://www.fda.gov/media/138098/download Fact Sheet for Healthcare Providers: quierodirigir.comhttps://www.fda.gov/media/138095/download This test is not yet approved or cleared by the [place] FDA and  has been authorized for detection and/or diagnosis of SARS-CoV-2 by FDA under an Emergency Use Authorization (EUA). This EUA will remain  in effect (meaning this test can be used) for the duration of the COVID-19 declaration under Section 56 4(b)(1) of the Act, 21 U.S.C. section 360bbb-3(b)(1), unless the authorization is terminated or revoked sooner. Performed at [facility], [address] [place], [place] [address]      Time coordinating discharge: Over 30 minutes  SIGNED:   Charise KillianJamiese M Jamille Yoshino, MD  [facility] 08/01/2019, 2:50 PM Pager   If 7PM-7AM, please contact night-coverage [email or web address]

## 2019-08-01 NOTE — Discharge Instructions (Signed)
Sinus Tachycardia  Sinus tachycardia is a kind of fast heartbeat. In sinus tachycardia, the heart beats more than 100 times a minute. Sinus tachycardia starts in a part of the heart called the sinus node. Sinus tachycardia may be harmless, or it may be a sign of a serious condition. What are the causes? This condition may be caused by:  Exercise or exertion.  A fever.  Pain.  Loss of body fluids (dehydration).  Severe bleeding (hemorrhage).  Anxiety and stress.  Certain substances, including: ? Alcohol. ? Caffeine. ? Tobacco and nicotine products. ? Cold medicines. ? Illegal drugs.  Medical conditions including: ? Heart disease. ? An infection. ? An overactive thyroid (hyperthyroidism). ? A lack of red blood cells (anemia). What are the signs or symptoms? Symptoms of this condition include:  A feeling that the heart is beating quickly (palpitations).  Suddenly noticing your heartbeat (cardiac awareness).  Dizziness.  Tiredness (fatigue).  Shortness of breath.  Chest pain.  Nausea.  Fainting. How is this diagnosed? This condition is diagnosed with:  A physical exam.  Other tests, such as: ? Blood tests. ? An electrocardiogram (ECG). This test measures the electrical activity of the heart. ? Ambulatory cardiac monitor. This records your heartbeats for 24 hours or more. You may be referred to a heart specialist (cardiologist). How is this treated? Treatment for this condition depends on the cause or the underlying condition. Treatment may involve:  Treating the underlying condition.  Taking new medicines or changing your current medicines as told by your health care provider.  Making changes to your diet or lifestyle. Follow these instructions at home: Lifestyle   Do not use any products that contain nicotine or tobacco, such as cigarettes and e-cigarettes. If you need help quitting, ask your health care provider.  Do not use illegal drugs, such as  cocaine.  Learn relaxation methods to help you when you get stressed or anxious. These include deep breathing.  Avoid caffeine or other stimulants. Alcohol use   Do not drink alcohol if: ? Your health care provider tells you not to drink. ? You are pregnant, may be pregnant, or are planning to become pregnant.  If you drink alcohol, limit how much you have: ? 0-1 drink a day for women. ? 0-2 drinks a day for men.  Be aware of how much alcohol is in your drink. In the [place] one drink equals one typical bottle of beer (12 oz), one-half glass of wine (5 oz), or one shot of hard liquor (1 oz). General instructions  Drink enough fluids to keep your urine pale yellow.  Take over-the-counter and prescription medicines only as told by your health care provider.  Keep all follow-up visits as told by your health care provider. This is important. Contact a health care provider if you have:  A fever.  Vomiting or diarrhea that does not go away. Get help right away if you:  Have pain in your chest, upper arms, jaw, or neck.  Become weak or dizzy.  Feel faint.  Have palpitations that do not go away. Summary  In sinus tachycardia, the heart beats more than 100 times a minute.  Sinus tachycardia may be harmless, or it may be a sign of a serious condition.  Treatment for this condition depends on the cause or the underlying condition.  Get help right away if you have pain in your chest, upper arms, jaw, or neck. This information is not intended to replace advice given to you by   your health care provider. Make sure you discuss any questions you have with your health care provider. Document Revised: 06/24/2017 Document Reviewed: 06/24/2017 Elsevier Patient Education  2020 Elsevier Inc.  

## 2019-08-03 ENCOUNTER — Telehealth: Payer: Self-pay

## 2019-08-03 NOTE — Telephone Encounter (Signed)
Spoke with patient regarding results and recommendation.  Patient verbalizes understanding and is agreeable to plan of care. Advised patient to call back with any issues or concerns.  

## 2019-08-03 NOTE — Telephone Encounter (Signed)
Left message on patients voicemail to please return our call.   

## 2019-08-03 NOTE — Telephone Encounter (Signed)
-----   Message from Iran Ouch, MD sent at 08/01/2019  4:46 PM EDT ----- Inform patient that echo was fine.  Normal ejection fraction with only mild valvular abnormalities.

## 2019-08-03 NOTE — Telephone Encounter (Signed)
Failed attempt to reach patient for transition of care. Discharge from ED observation 08/01/19. Recommended follow up with pcp and cardiology in 1-2 weeks. Will follow.

## 2019-08-04 NOTE — Telephone Encounter (Signed)
Second failed attempt to reach patient for transition of care. Left message to follow up with pcp as needed.

## 2019-08-11 ENCOUNTER — Ambulatory Visit: Payer: Medicare PPO | Admitting: Cardiovascular Disease

## 2019-08-11 ENCOUNTER — Encounter: Payer: Self-pay | Admitting: Cardiovascular Disease

## 2019-08-11 ENCOUNTER — Other Ambulatory Visit: Payer: Self-pay

## 2019-08-11 VITALS — BP 136/84 | HR 83 | Ht 62.0 in | Wt 185.2 lb

## 2019-08-11 DIAGNOSIS — I471 Supraventricular tachycardia: Secondary | ICD-10-CM

## 2019-08-11 DIAGNOSIS — R778 Other specified abnormalities of plasma proteins: Secondary | ICD-10-CM

## 2019-08-11 MED ORDER — METOPROLOL TARTRATE 25 MG PO TABS: 12.5000 mg | ORAL_TABLET | Freq: Two times a day (BID) | ORAL | 5 refills | Status: DC

## 2019-08-11 NOTE — Patient Instructions (Signed)
Medication Instructions:  Your physician has recommended you make the following change in your medication:   RESUME Metoprolol 12.5mg  (1/2 tablet) twice daily. An Rx has been sent to your pharmacy.  *If you need a refill on your cardiac medications before your next appointment, please call your pharmacy*   Lab Work: None ordered If you have labs (blood work) drawn today and your tests are completely normal, you will receive your results only by: Marland Kitchen MyChart Message (if you have MyChart) OR . A paper copy in the mail If you have any lab test that is abnormal or we need to change your treatment, we will call you to review the results.   Testing/Procedures: None ordered   Follow-Up: At [facility], you and your health needs are our priority.  As part of our continuing mission to provide you with exceptional heart care, we have created designated Provider Care Teams.  These Care Teams include your primary Cardiologist (physician) and Advanced Practice Providers (APPs -  Physician Assistants and Nurse Practitioners) who all work together to provide you with the care you need, when you need it.  We recommend signing up for the patient portal called "MyChart".  Sign up information is provided on this After Visit Summary.  MyChart is used to connect with patients for Virtual Visits (Telemedicine).  Patients are able to view lab/test results, encounter notes, upcoming appointments, etc.  Non-urgent messages can be sent to your provider as well.   To learn more about what you can do with MyChart, go to [email or web address].    Your next appointment:   6 month(s)  The format for your next appointment:   In Person  Provider:    You may see Lorine Bears, MD or one of the following Advanced Practice Providers on your designated Care Team:    Nicolasa Ducking, NP  Eula Listen, PA-C  Marisue Ivan, PA-C    Other Instructions N/A

## 2019-08-11 NOTE — Progress Notes (Signed)
Cardiology Office Note   Date:  08/11/2019   ID:  Kristy, Mejia [birth year], MRN [id number]  PCP:  McLean-Scocuzza, Pasty Spillers, MD  Cardiologist:   Lorine Bears, MD   Chief Complaint  Patient presents with  . office visit    ER F/U-tachycardia; Meds verbally reviewed with patient.      History of Present Illness: Kristy Mejia is a 74 y.o. female who presents for follow-up visit regarding paroxysmal supraventricular tachycardia and atypical chest pain She had an episode of SVT which required emergency room visit in December 2016 and was terminated with adenosine. Troponin was mildly elevated.  He underwent a nuclear stress test in 04/2015 which showed no evidence of ischemia with normal ejection fraction.   She had chest pain last year that was felt to be GI in nature related to starting Mobic.  Symptoms resolved after stopping the medication and taking short-term Zantac. She did have a rash with diltiazem which was discontinued.   The patient was doing reasonably well on metoprolol tartrate 25 mg twice daily.  However, she was seen recently and complained of excessive fatigue and wanted to come off the medication.  Metoprolol was weaned off slowly.  In addition, she underwent a Lexiscan Myoview earlier this month which showed no evidence of ischemia with normal ejection fraction.  Echocardiogram showed normal LV systolic function with mild mitral regurgitation and mild to moderate tricuspid regurgitation with mild pulmonary hypertension. She developed an episode of tachycardia on March 14 that did not respond to Valsalva maneuvers and 3 doses of metoprolol.  She went to the emergency room and was noted to be in SVT.  She converted to sinus rhythm with adenosine.  She was noted to have mildly elevated troponin and thus was observed overnight. No recurrent prolonged tachycardia since then although she continues to feel some palpitations.  No chest pain.    Past Medical History:   Diagnosis Date  . Arthritis   . Atypical chest pain    a. 04/2015 Myoview: EF 78%, breast attenuation, no ischemia-->Low risk.  . Back pain    scoliosis  . Chicken pox   . Dysrhythmia    hx palpitations  . GERD (gastroesophageal reflux disease)   . History of hiatal hernia    noted on cxr  . Hyperlipidemia   . Joint pain   . Obesity, unspecified   . Paroxysmal SVT (supraventricular tachycardia) (HCC)    a. 2013 Holter: PACs/PVCs; b. 10/2011 Ehco: EF nl, no rwma, mild LVH, mild TR, PASP ; c 04/2015 SVT in ED->resolved with adenosine; c.   . Pneumonia 2010   hx of  . Scoliosis    multiple areas of back follows with Dr. Dyke Maes chiropractor     Past Surgical History:  Procedure Laterality Date  . ABDOMINAL HYSTERECTOMY     1990 ovaries intact. had 2/2 fibroids last pap 2004 neg.  ? if cervix present   . BACK SURGERY  1960's   d/t scoliosis-1964/65?  Marland Kitchen BACK SURGERY     x 3   . BREAST BIOPSY Left 2008   CORE W/CLIP - NEG  . BREAST BIOPSY     2000   . BREAST EXCISIONAL BIOPSY Right 20 + yrs ago  . BREAST SURGERY  1990   excision breast mass  . COLONOSCOPY  2007   Dr. Mechele Collin  . COLONOSCOPY WITH ESOPHAGOGASTRODUODENOSCOPY (EGD) AND ESOPHAGEAL DILATION (ED)    . COLONOSCOPY WITH PROPOFOL N/A 03/22/2018  Procedure: COLONOSCOPY WITH PROPOFOL;  Surgeon: Scot Jun, MD;  Location: [facility] ENDOSCOPY;  Service: Endoscopy;  Laterality: N/A;  . ESOPHAGOGASTRODUODENOSCOPY (EGD) WITH PROPOFOL N/A 03/22/2018   Procedure: ESOPHAGOGASTRODUODENOSCOPY (EGD) WITH PROPOFOL;  Surgeon: Scot Jun, MD;  Location: [facility] ENDOSCOPY;  Service: Endoscopy;  Laterality: N/A;  . EYE SURGERY  2007   cataracts  . FOOT SURGERY  1960's  . JOINT REPLACEMENT     total hip left with metal hardward 2016 Dr. Alphonsa Gin ortho   . OTHER SURGICAL HISTORY  2004   Bilateral cataracts  . PARTIAL HYSTERECTOMY  1990  . REVISION TOTAL HIP ARTHROPLASTY Left 04/17/2014   DR Turner Daniels  . TOTAL HIP  ARTHROPLASTY  2006  . TOTAL HIP REVISION Left 04/17/2014   Procedure: LEFT TOTAL HIP REVISION;  Surgeon: Nestor Lewandowsky, MD;  Location: [facility] OR;  Service: Orthopedics;  Laterality: Left;  . TOTAL KNEE ARTHROPLASTY Right 10/30/2017   Procedure: RIGHT TOTAL KNEE ARTHROPLASTY;  Surgeon: Gean Birchwood, MD;  Location: [facility] OR;  Service: Orthopedics;  Laterality: Right;     Current Outpatient Medications  Medication Sig Dispense Refill  . aspirin EC 81 MG tablet Take 1 tablet (81 mg total) by mouth 2 (two) times daily. (Patient taking differently: Take 81 mg by mouth daily. ) 60 tablet 0  . aspirin EC 81 MG tablet Take 81 mg by mouth daily.    . meloxicam (MOBIC) 15 MG tablet Take 15 mg by mouth daily as needed.     . metoprolol tartrate (LOPRESSOR) 25 MG tablet Take 1 tablet (25 mg total) by mouth daily as needed.     No current facility-administered medications for this visit.    Allergies:   Diltiazem, Metoprolol succinate [metoprolol], and Tape    Social History:  The patient  reports that she has never smoked. She has never used smokeless tobacco. She reports previous alcohol use. She reports that she does not use drugs.   Family History:  The patient's family history includes Aneurysm in her brother; Arthritis in her mother; Breast cancer (age of onset: 65) in her mother; Cancer in her mother; Colon cancer in her cousin; Dementia in her mother; Diabetes in her father; Early death in her brother and father; Heart attack in her father; Heart disease in her father; Hyperlipidemia in her father; Hypertension in her father.    ROS:  Please see the history of present illness.   Otherwise, review of systems are positive for none.   All other systems are reviewed and negative.    PHYSICAL EXAM: VS:  BP 136/84 (BP Location: Left Arm, Patient Position: Sitting, Cuff Size: Normal)   Pulse 83   Ht 5\' 2"  (1.575 m)   Wt 185 lb 4 oz (84 kg)   SpO2 99%   BMI 33.88 kg/m  , BMI Body mass index is 33.88  kg/m. GEN: Well nourished, well developed, in no acute distress  HEENT: normal  Neck: no JVD, carotid bruits, or masses Cardiac: RRR; no murmurs, rubs, or gallops,no edema  Respiratory:  clear to auscultation bilaterally, normal work of breathing GI: soft, nontender, nondistended, + BS MS: no deformity or atrophy  Skin: warm and dry, no rash Neuro:  Strength and sensation are intact Psych: euthymic mood, full affect   EKG:  EKG is ordered today. The ekg ordered today demonstrates normal sinus rhythm with no significant ST or T wave changes.   Recent Labs: 07/31/2019: ALT 34 08/01/2019: BUN 20; Creatinine, Ser 0.64; Hemoglobin  13.5; Platelets 208; Potassium 3.8; Sodium 140; TSH 1.875    Lipid Panel    Component Value Date/Time   CHOL 225 (H) 01/26/2019 0817   TRIG 62.0 01/26/2019 0817   HDL 61.80 01/26/2019 0817   CHOLHDL 4 01/26/2019 0817   VLDL 12.4 01/26/2019 0817   LDLCALC 151 (H) 01/26/2019 0817      Wt Readings from Last 3 Encounters:  08/11/19 185 lb 4 oz (84 kg)  08/01/19 182 lb 11.2 oz (82.9 kg)  07/12/19 185 lb 8 oz (84.1 kg)       No flowsheet data found.    ASSESSMENT AND PLAN:   1. Paroxysmal supraventricular tachycardia:  Recent episode that required adenosine.  I discussed with her management options.  Not able to use diltiazem due to previous rash.  Metoprolol caused fatigue but overall was reasonably controlling her symptoms.  I elected to resume metoprolol tartrate at 12.5 mg twice daily.  I also discussed the option of proceeding with ablation and she wants to hold off for now.  If she develops more than 2-3 episodes of symptomatic SVT per year, it would be worth proceeding with ablation.  2.  Mildly elevated troponin: Likely due to supply demand ischemia in the setting of tachycardia.  Recent stress test was normal and echocardiogram was overall reassuring.      Disposition:   FU with me in 6 months  Signed,  Kathlyn Sacramento, MD  08/11/2019  1:44 PM    [place]

## 2019-08-19 ENCOUNTER — Other Ambulatory Visit: Payer: Self-pay | Admitting: Cardiovascular Disease

## 2019-08-24 DIAGNOSIS — Z03818 Encounter for observation for suspected exposure to other biological agents ruled out: Secondary | ICD-10-CM | POA: Diagnosis not present

## 2019-08-25 ENCOUNTER — Other Ambulatory Visit: Payer: Self-pay

## 2019-08-25 ENCOUNTER — Encounter: Payer: Self-pay | Admitting: Podiatry

## 2019-08-25 ENCOUNTER — Ambulatory Visit: Payer: Medicare PPO | Admitting: Podiatry

## 2019-08-25 VITALS — Temp 97.3°F

## 2019-08-25 DIAGNOSIS — M79675 Pain in left toe(s): Secondary | ICD-10-CM | POA: Diagnosis not present

## 2019-08-25 DIAGNOSIS — M79674 Pain in right toe(s): Secondary | ICD-10-CM | POA: Diagnosis not present

## 2019-08-25 DIAGNOSIS — B351 Tinea unguium: Secondary | ICD-10-CM | POA: Diagnosis not present

## 2019-08-25 NOTE — Progress Notes (Signed)
This patient presents to the office to pick up her newly made orthoses.  She says she saw Rick who casted her for orthoses.  She has HAV  B/L and hammer toes 2-5  B/L.  She has previously been treated for corn on her fourth toe left foot.  She presents to pick up her orthoses.  Vascular  Dorsalis pedis and posterior tibial pulses are palpable  B/L.  Capillary return  WNL.  Temperature gradient is  WNL.  Skin turgor  WNL  Sensorium  Senn Weinstein monofilament wire  WNL. Normal tactile sensation.  Nail Exam  Patient has normal nails with no evidence of bacterial or fungal infection. Thick nail fourth toe left foot.  Orthopedic  Exam  Muscle tone and muscle strength  WNL.  No limitations of motion feet  B/L.  No crepitus or joint effusion noted.  Foot type is unremarkable and digits show no abnormalities.  HAV  B/L.  Hammer toes 2-5  B/L. Plantar flexed fifth met left foot.    Skin  No open lesions.  Normal skin texture and turgor.  HAV  B/L  Plantar porokeratosis  B/l.  ROV.  Discussed orthoses wearing with patient.  Patient is to see Rick for orthotic adjustments as needed.   Gregory Mayer DPM 

## 2019-08-26 ENCOUNTER — Emergency Department
Admission: EM | Admit: 2019-08-26 | Discharge: 2019-08-26 | Disposition: A | Discharge status: Home / Self Care | Payer: Medicare PPO | Attending: Emergency Medicine | Admitting: Emergency Medicine

## 2019-08-26 ENCOUNTER — Emergency Department: Payer: Medicare PPO

## 2019-08-26 ENCOUNTER — Other Ambulatory Visit: Payer: Self-pay

## 2019-08-26 ENCOUNTER — Encounter: Payer: Self-pay | Admitting: Emergency Medicine

## 2019-08-26 DIAGNOSIS — I471 Supraventricular tachycardia: Secondary | ICD-10-CM | POA: Diagnosis not present

## 2019-08-26 DIAGNOSIS — R0789 Other chest pain: Secondary | ICD-10-CM | POA: Insufficient documentation

## 2019-08-26 DIAGNOSIS — R079 Chest pain, unspecified: Secondary | ICD-10-CM

## 2019-08-26 DIAGNOSIS — Z79899 Other long term (current) drug therapy: Secondary | ICD-10-CM | POA: Insufficient documentation

## 2019-08-26 DIAGNOSIS — R000 Tachycardia, unspecified: Secondary | ICD-10-CM | POA: Diagnosis present

## 2019-08-26 LAB — BASIC METABOLIC PANEL
Anion gap: 7 (ref 5–15)
BUN: 20 mg/dL (ref 8–23)
CO2: 26 mmol/L (ref 22–32)
Calcium: 9.3 mg/dL (ref 8.9–10.3)
Chloride: 106 mmol/L (ref 98–111)
Creatinine, Ser: 0.83 mg/dL (ref 0.44–1.00)
GFR calc Af Amer: >60 mL/min (ref >60)
GFR calc non Af Amer: >60 mL/min (ref >60)
Glucose, Bld: 130 mg/dL — ABNORMAL HIGH (ref 70–99)
Potassium: 4.1 mmol/L (ref 3.5–5.1)
Sodium: 139 mmol/L (ref 135–145)

## 2019-08-26 LAB — CBC
HCT: 43.9 % (ref 36.0–46.0)
Hemoglobin: 14.2 g/dL (ref 12.0–15.0)
MCH: 30.3 pg (ref 26.0–34.0)
MCHC: 32.3 g/dL (ref 30.0–36.0)
MCV: 93.6 fL (ref 80.0–100.0)
Platelets: 243 10*3/uL (ref 150–400)
RBC: 4.69 MIL/uL (ref 3.87–5.11)
RDW: 13.2 % (ref 11.5–15.5)
WBC: 4.6 10*3/uL (ref 4.0–10.5)
nRBC: 0 % (ref 0.0–0.2)

## 2019-08-26 MED ORDER — ONDANSETRON HCL 4 MG/2ML IJ SOLN
4.0000 mg | Freq: Once | INTRAMUSCULAR | Status: DC
  Filled 2019-08-26: qty 2

## 2019-08-26 MED ORDER — ONDANSETRON HCL 4 MG/2ML IJ SOLN
4.0000 mg | Freq: Once | INTRAMUSCULAR | Status: AC
  Administered 2019-08-26: 4 mg via INTRAVENOUS

## 2019-08-26 MED ORDER — SODIUM CHLORIDE 0.9% FLUSH: 3.0000 mL | Freq: Once | INTRAVENOUS | Status: DC

## 2019-08-26 MED ORDER — ADENOSINE 12 MG/4ML IV SOLN
INTRAVENOUS | Status: AC
  Administered 2019-08-26: 19:00:00 6 mg via INTRAVENOUS
  Filled 2019-08-26: qty 4

## 2019-08-26 MED ORDER — ADENOSINE 6 MG/2ML IV SOLN
INTRAVENOUS | Status: AC
  Filled 2019-08-26: qty 2

## 2019-08-26 MED ORDER — ADENOSINE 6 MG/2ML IV SOLN: 6.0000 mg | Freq: Once | INTRAVENOUS | Status: AC

## 2019-08-26 NOTE — ED Provider Notes (Signed)
[facility] Emergency Department Provider Note  ____________________________________________   I have reviewed the triage vital signs and the nursing notes.   HISTORY  Chief Complaint Tachycardia   History limited by: Not Limited   HPI Kristy Mejia is a 74 y.o. female who presents to the emergency department today because of concern for fast heart rate. The patient states that she has a history of supraventricular tachycardia.  She started feeling like her heart was racing earlier in the day.  She tried taking extra doses of her metoprolol without any relief.  Additionally she tried vagal maneuvers.  She denies any associated chest pain.  She states she has required adenosine in the past.  Denies any recent illness.  Denies any caffeine use today.   Records reviewed. Per medical record review patient has a history of SVT.  Past Medical History:  Diagnosis Date  . Arthritis   . Atypical chest pain    a. 04/2015 Myoview: EF 78%, breast attenuation, no ischemia-->Low risk.  . Back pain    scoliosis  . Chicken pox   . Dysrhythmia    hx palpitations  . GERD (gastroesophageal reflux disease)   . History of hiatal hernia    noted on cxr  . Hyperlipidemia   . Joint pain   . Obesity, unspecified   . Paroxysmal SVT (supraventricular tachycardia) (HCC)    a. 2013 Holter: PACs/PVCs; b. 10/2011 Ehco: EF nl, no rwma, mild LVH, mild TR, PASP ; c 04/2015 SVT in ED->resolved with adenosine; c.   . Pneumonia 2010   hx of  . Scoliosis    multiple areas of back follows with Dr. Dyke Maes chiropractor     Patient Active Problem List   Diagnosis Date Noted  . SVT (supraventricular tachycardia) (HCC) 07/31/2019  . Pain due to onychomycosis of toenails of both feet 06/16/2019  . Corns and callosities 06/16/2019  . Abnormal glucose level 10/20/2018  . Menopausal syndrome 10/20/2018  . Obesity 10/20/2018  . Shoulder pain 10/20/2018  . Hot flashes 09/28/2018   . Leukopenia 05/21/2018  . Duodenitis 05/21/2018  . Hiatal hernia 01/04/2018  . Microscopic hematuria 01/04/2018  . Primary osteoarthritis of right knee 10/30/2017  . Osteoarthritis of right knee 10/27/2017  . Knee pain, bilateral 09/18/2017  . Hyperlipidemia 04/21/2017  . Paroxysmal supraventricular tachycardia (HCC) 04/24/2015  . Prediabetes 04/24/2015  . Status post revision of total hip 04/17/2014  . 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 Family history of breast cancer 06/22/2013  . Diffuse cystic mastopathy 06/20/2013  . Palpitations 09/25/2011  . Dyspnea 09/25/2011    Past Surgical History:  Procedure Laterality Date  . ABDOMINAL HYSTERECTOMY     1990 ovaries intact. had 2/2 fibroids last pap 2004 neg.  ? if cervix present   . BACK SURGERY  1960's   d/t scoliosis-1964/65?  Marland Kitchen BACK SURGERY     x 3   . BREAST BIOPSY Left 2008   CORE W/CLIP - NEG  . BREAST BIOPSY     2000   . BREAST EXCISIONAL BIOPSY Right 20 + yrs ago  . BREAST SURGERY  1990   excision breast mass  . COLONOSCOPY  2007   Dr. Mechele Collin  . COLONOSCOPY WITH ESOPHAGOGASTRODUODENOSCOPY (EGD) AND ESOPHAGEAL DILATION (ED)    . COLONOSCOPY WITH PROPOFOL N/A 03/22/2018   Procedure: COLONOSCOPY WITH PROPOFOL;  Surgeon: Scot Jun, MD;  Location: [facility] ENDOSCOPY;  Service: Endoscopy;  Laterality: N/A;  . ESOPHAGOGASTRODUODENOSCOPY (EGD) WITH PROPOFOL  N/A 03/22/2018   Procedure: ESOPHAGOGASTRODUODENOSCOPY (EGD) WITH PROPOFOL;  Surgeon: Scot Jun, MD;  Location: [facility] ENDOSCOPY;  Service: Endoscopy;  Laterality: N/A;  . EYE SURGERY  2007   cataracts  . FOOT SURGERY  1960's  . JOINT REPLACEMENT     total hip left with metal hardward 2016 Dr. Alphonsa Gin ortho   . OTHER SURGICAL HISTORY  2004   Bilateral cataracts  . PARTIAL HYSTERECTOMY  1990  . REVISION TOTAL HIP ARTHROPLASTY Left 04/17/2014   DR Turner Daniels  . TOTAL HIP ARTHROPLASTY  2006  . TOTAL HIP REVISION Left 04/17/2014    Procedure: LEFT TOTAL HIP REVISION;  Surgeon: Nestor Lewandowsky, MD;  Location: [facility] OR;  Service: Orthopedics;  Laterality: Left;  . TOTAL KNEE ARTHROPLASTY Right 10/30/2017   Procedure: RIGHT TOTAL KNEE ARTHROPLASTY;  Surgeon: Gean Birchwood, MD;  Location: [facility] OR;  Service: Orthopedics;  Laterality: Right;    Prior to Admission medications   Medication Sig Start Date End Date Taking? Authorizing Provider  aspirin EC 81 MG tablet Take 1 tablet (81 mg total) by mouth 2 (two) times daily. Patient taking differently: Take 81 mg by mouth daily.  10/30/17   Allena Katz, PA-C  aspirin EC 81 MG tablet Take 81 mg by mouth daily.    [provider]  meloxicam (MOBIC) 15 MG tablet Take 15 mg by mouth daily as needed.  09/20/18   [provider]  metoprolol tartrate (LOPRESSOR) 25 MG tablet Take 0.5 tablets (12.5 mg total) by mouth 2 (two) times daily. 08/11/19   Iran Ouch, MD    Allergies Diltiazem, Metoprolol succinate [metoprolol], and Tape  Family History  Problem Relation Age of Onset  . Heart attack Father   . Heart disease Father        MI  . Diabetes Father   . Hypertension Father   . Early death Father   . Hyperlipidemia Father   . Cancer Mother        breast cancer  . Breast cancer Mother 57  . Arthritis Mother   . Dementia Mother   . Aneurysm Brother        cardiac   . Early death Brother   . Colon cancer Cousin     Social History Social History   Tobacco Use  . Smoking status: Never Smoker  . Smokeless tobacco: Never Used  Substance Use Topics  . Alcohol use: Not Currently    Comment: rare  . Drug use: No    Review of Systems Constitutional: No fever/chills Eyes: No visual changes. ENT: No sore throat. Cardiovascular: Positive for fast heart rate. Respiratory: Denies shortness of breath. Gastrointestinal: No abdominal pain.  No nausea, no vomiting.  No diarrhea.   Genitourinary: Negative for dysuria. Musculoskeletal: Negative for back  pain. Skin: Negative for rash. Neurological: Negative for headaches, focal weakness or numbness.  ____________________________________________   PHYSICAL EXAM:  VITAL SIGNS: ED Triage Vitals  Enc Vitals Group     BP 08/26/19 1831 96/62     Pulse Rate 08/26/19 1831 (!) 145     Resp 08/26/19 1831 16     Temp 08/26/19 1831 97.9 F (36.6 C)     Temp src --      SpO2 08/26/19 1831 99 %     Weight 08/26/19 1824 180 lb (81.6 kg)     Height 08/26/19 1824 5\' 2"  (1.575 m)     Head Circumference --      Peak Flow --  Pain Score 08/26/19 1824 0   Constitutional: Alert and oriented.  Eyes: Conjunctivae are normal.  ENT      Head: Normocephalic and atraumatic.      Nose: No congestion/rhinnorhea.      Mouth/Throat: Mucous membranes are moist.      Neck: No stridor. Cardiovascular: Tachycardic, regular rhythm.  No murmurs, rubs, or gallops.  Respiratory: Normal respiratory effort without tachypnea nor retractions. Breath sounds are clear and equal bilaterally. No wheezes/rales/rhonchi. Gastrointestinal: Soft and non tender. No rebound. No guarding.  Genitourinary: Deferred Musculoskeletal: Normal range of motion in all extremities. No lower extremity edema. Neurologic:  Normal speech and language. No gross focal neurologic deficits are appreciated.  Skin:  Skin is warm, dry and intact. No rash noted. Psychiatric: Mood and affect are normal. Speech and behavior are normal. Patient exhibits appropriate insight and judgment.  ____________________________________________    LABS (pertinent positives/negatives)  CBC wbc 4.6, hgb 14.2, plt 243 BMP wnl except glu 130 ____________________________________________   EKG  I, Nance Pear, attending physician, personally viewed and interpreted this EKG  EKG Time: 1824 Rate: 144 Rhythm: wide qrs tachycardia Axis: normal Intervals: qtc 470 QRS: wide ST changes: no st elevation Impression: abnormal ekg  I, Nance Pear,  attending physician, personally viewed and interpreted this EKG  EKG Time: 1902 Rate: 81 Rhythm: sinus rhythm Axis: normal Intervals: qtc 418 QRS: narrow ST changes: no st elevation Impression: normal ekg    ____________________________________________    RADIOLOGY  CXR Large hiatal hernia, cardiomegaly. No acute abnormality.  ____________________________________________   PROCEDURES  Procedures  ____________________________________________   INITIAL IMPRESSION / ASSESSMENT AND PLAN / ED COURSE  Pertinent labs & imaging results that were available during my care of the patient were reviewed by me and considered in my medical decision making (see chart for details).   Patient presented to the emergency department today because of concerns for fast heart rate.  Initial EKG showed supraventricular tachycardia.  Patient has a history of this.  Patient tried vagal maneuvers at home without success.  She also tried metoprolol at home without any change.  Patient was given a adenosine here in the emergency department and converted back to a sinus rhythm. Blood work without concerning electrolyte abnormality. Will discharge. Discussed return precautions and follow up with cardiology.  ___________________________________________   FINAL CLINICAL IMPRESSION(S) / ED DIAGNOSES  Final diagnoses:  Chest pain     Note: This dictation was prepared with Dragon dictation. Any transcriptional errors that result from this process are unintentional     Nance Pear, MD 08/26/19 2024

## 2019-08-26 NOTE — ED Notes (Signed)
6 mg adenosine given. Lorrie RN and Dr Derrill Kay at bedside.  Pt on pads, code cart at bedside

## 2019-08-26 NOTE — ED Notes (Signed)
Verbal order 6mg  adenosine Dr 

## 2019-08-26 NOTE — Discharge Instructions (Addendum)
Please seek medical attention for any high fevers, chest pain, shortness of breath, change in behavior, persistent vomiting, bloody stool or any other new or concerning symptoms.  

## 2019-08-26 NOTE — ED Triage Notes (Signed)
Pt to ED via POV c/o rapid heart rate. Pt states that she was seen in ED recently for same. Pt reports that she has hx/o SVT/ Pt first noticed symptoms today around 145pm. Pt is in NAD.

## 2019-08-26 NOTE — ED Notes (Signed)
Pt to ED for "heart racing" that started at approx 1:45 this afternoon. Pt reports nausea and vomiting. Denies CP

## 2019-09-02 DIAGNOSIS — K219 Gastro-esophageal reflux disease without esophagitis: Secondary | ICD-10-CM | POA: Diagnosis not present

## 2019-09-02 DIAGNOSIS — I471 Supraventricular tachycardia: Secondary | ICD-10-CM | POA: Diagnosis not present

## 2019-09-02 DIAGNOSIS — E669 Obesity, unspecified: Secondary | ICD-10-CM | POA: Diagnosis not present

## 2019-09-02 DIAGNOSIS — R0789 Other chest pain: Secondary | ICD-10-CM | POA: Diagnosis not present

## 2019-09-02 DIAGNOSIS — R002 Palpitations: Secondary | ICD-10-CM | POA: Diagnosis not present

## 2019-09-08 DIAGNOSIS — Z03818 Encounter for observation for suspected exposure to other biological agents ruled out: Secondary | ICD-10-CM | POA: Diagnosis not present

## 2019-09-29 ENCOUNTER — Ambulatory Visit: Payer: Medicare Other | Admitting: Internal Medicine

## 2019-09-29 ENCOUNTER — Ambulatory Visit (INDEPENDENT_AMBULATORY_CARE_PROVIDER_SITE_OTHER): Payer: Medicare PPO

## 2019-09-29 ENCOUNTER — Ambulatory Visit: Payer: Medicare Other

## 2019-09-29 VITALS — Ht 62.0 in | Wt 180.0 lb

## 2019-09-29 DIAGNOSIS — Z0000 Encounter for general adult medical examination without abnormal findings: Secondary | ICD-10-CM | POA: Diagnosis not present

## 2019-09-29 NOTE — Progress Notes (Addendum)
Subjective:   Kristy Mejia is a 74 y.o. female who presents for Medicare Annual (Subsequent) preventive examination.  Review of Systems:  No ROS.  Medicare Wellness Virtual Visit.  Visual/audio telehealth visit, UTA vital signs.  Ht/Wt provided.   See social history for additional risk factors.   Cardiac Risk Factors include: advanced age (>17men, >10 women)     Objective:     Vitals: Ht 5\' 2"  (1.575 m)   Wt 180 lb (81.6 kg)   BMI 32.92 kg/m   Body mass index is 32.92 kg/m.  Advanced Directives 09/29/2019 08/26/2019 07/31/2019 10/08/2018 09/28/2018 06/03/2018 03/22/2018  Does Patient Have a Medical Advance Directive? Yes No No Yes Yes Yes Yes  Type of Advance Directive Living will;Healthcare Power of [place];Living will [place];Living will [place];Living will -  Does patient want to make changes to medical advance directive? No - Patient declined - - No - Patient declined No - Patient declined No - Patient declined -  Copy of [place] in Chart? Yes - validated most recent copy scanned in chart (See row information) - - - Yes - validated most recent copy scanned in chart (See row information) Yes - validated most recent copy scanned in chart (See row information) -  Would patient like information on creating a medical advance directive? - No - Patient declined No - Patient declined - - No - Patient declined -    Tobacco Social History   Tobacco Use  Smoking Status Never Smoker  Smokeless Tobacco Never Used     Counseling given: Not Answered   Clinical Intake:  Pre-visit preparation completed: Yes        Diabetes: No  How often do you need to have someone help you when you read instructions, pamphlets, or other written materials from your doctor or pharmacy?: 1 - Never  Interpreter Needed?: No     Past Medical History:  Diagnosis Date  . Arthritis   . Atypical chest  pain    a. 04/2015 Myoview: EF 78%, breast attenuation, no ischemia-->Low risk.  . Back pain    scoliosis  . Chicken pox   . Dysrhythmia    hx palpitations  . GERD (gastroesophageal reflux disease)   . History of hiatal hernia    noted on cxr  . Hyperlipidemia   . Joint pain   . Obesity, unspecified   . Paroxysmal SVT (supraventricular tachycardia) ([place])    a. 2013 Holter: PACs/PVCs; b. 10/2011 Ehco: EF nl, no rwma, mild LVH, mild TR, PASP 75mmHg; c 04/2015 SVT in ED->resolved with adenosine; c.   . Pneumonia 2010   hx of  . Scoliosis    multiple areas of back follows with Dr. [place] Service chiropractor    Past Surgical History:  Procedure Laterality Date  . ABDOMINAL HYSTERECTOMY     1990 ovaries intact. had 2/2 fibroids last pap 2004 neg.  ? if cervix present   . BACK SURGERY  1960's   d/t scoliosis-1964/65?  Marland Kitchen BACK SURGERY     x 3   . BREAST BIOPSY Left 2008   CORE W/CLIP - NEG  . BREAST BIOPSY     2000   . BREAST EXCISIONAL BIOPSY Right 20 + yrs ago  . BREAST SURGERY  1990   excision breast mass  . COLONOSCOPY  2007   Dr. Vira Agar  . COLONOSCOPY WITH ESOPHAGOGASTRODUODENOSCOPY (EGD) AND ESOPHAGEAL DILATION (ED)    .  COLONOSCOPY WITH PROPOFOL N/A 03/22/2018   Procedure: COLONOSCOPY WITH PROPOFOL;  Surgeon: Scot Jun, MD;  Location: [facility] ENDOSCOPY;  Service: Endoscopy;  Laterality: N/A;  . ESOPHAGOGASTRODUODENOSCOPY (EGD) WITH PROPOFOL N/A 03/22/2018   Procedure: ESOPHAGOGASTRODUODENOSCOPY (EGD) WITH PROPOFOL;  Surgeon: Scot Jun, MD;  Location: [facility] ENDOSCOPY;  Service: Endoscopy;  Laterality: N/A;  . EYE SURGERY  2007   cataracts  . FOOT SURGERY  1960's  . JOINT REPLACEMENT     total hip left with metal hardward 2016 Dr. Alphonsa Gin ortho   . OTHER SURGICAL HISTORY  2004   Bilateral cataracts  . PARTIAL HYSTERECTOMY  1990  . REVISION TOTAL HIP ARTHROPLASTY Left 04/17/2014   DR Turner Daniels  . TOTAL HIP ARTHROPLASTY  2006  . TOTAL HIP REVISION Left  04/17/2014   Procedure: LEFT TOTAL HIP REVISION;  Surgeon: Nestor Lewandowsky, MD;  Location: [facility] OR;  Service: Orthopedics;  Laterality: Left;  . TOTAL KNEE ARTHROPLASTY Right 10/30/2017   Procedure: RIGHT TOTAL KNEE ARTHROPLASTY;  Surgeon: Gean Birchwood, MD;  Location: [facility] OR;  Service: Orthopedics;  Laterality: Right;   Family History  Problem Relation Age of Onset  . Heart attack Father   . Heart disease Father        MI  . Diabetes Father   . Hypertension Father   . Early death Father   . Hyperlipidemia Father   . Cancer Mother        breast cancer  . Breast cancer Mother 51  . Arthritis Mother   . Dementia Mother   . Varicose Veins Mother   . Aneurysm Brother        cardiac   . Early death Brother   . Colon cancer Cousin    Social History   Socioeconomic History  . Marital status: Married    Spouse name: Not on file  . Number of children: Not on file  . Years of education: Not on file  . Highest education level: Not on file  Occupational History  . Not on file  Tobacco Use  . Smoking status: Never Smoker  . Smokeless tobacco: Never Used  Substance and Sexual Activity  . Alcohol use: Not Currently    Comment: rare  . Drug use: No  . Sexual activity: Yes    Birth control/protection: Surgical  Other Topics Concern  . 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 Difficulty of Paying Living Expenses:   Food Insecurity:   . Worried About Programme researcher, broadcasting/film/video in the Last Year:   . Barista in the Last Year:   Transportation Needs:   . Freight forwarder (Medical):   Marland Kitchen Lack of Transportation (Non-Medical):   Physical Activity:   . Days of Exercise per Week:   . Minutes of Exercise per Session:   Stress:   . Feeling of Stress :   Social Connections:   . Frequency of Communication with  Friends and Family:   . Frequency of Social Gatherings with Friends and Family:   . Attends Religious Services:   . Active Member of Clubs or Organizations:   . Attends Banker Meetings:   Marland Kitchen Marital Status:     Outpatient Encounter Medications as of 09/29/2019  Medication Sig  .  aspirin EC 81 MG tablet Take 81 mg by mouth daily.  . meloxicam (MOBIC) 15 MG tablet Take 15 mg by mouth daily as needed.   . metoprolol tartrate (LOPRESSOR) 25 MG tablet Take 0.5 tablets (12.5 mg total) by mouth 2 (two) times daily.  . [DISCONTINUED] aspirin EC 81 MG tablet Take 1 tablet (81 mg total) by mouth 2 (two) times daily. (Patient taking differently: Take 81 mg by mouth daily. )   No facility-administered encounter medications on file as of 09/29/2019.    Activities of Daily Living In your present state of health, do you have any difficulty performing the following activities: 09/29/2019 07/31/2019  Hearing? N N  Vision? N N  Difficulty concentrating or making decisions? N N  Walking or climbing stairs? N N  Dressing or bathing? N N  Doing errands, shopping? N N  Preparing Food and eating ? N -  Using the Toilet? N -  In the past six months, have you accidently leaked urine? N -  Do you have problems with loss of bowel control? N -  Managing your Medications? N -  Managing your Finances? N -  Housekeeping or managing your Housekeeping? N -  Some recent data might be hidden    Patient Care Team: McLean-Scocuzza, Pasty Spillers, MD as PCP - General (Internal Medicine) Iran Ouch, MD as PCP - Cardiology (Cardiology) Kieth Brightly, MD (General Surgery)    Assessment:   This is a routine wellness examination for Tacie.  Nurse connected with patient 09/29/19 at  1:00 PM EDT by a telephone enabled telemedicine application, patient has no video access or technical difficulty with video, and verified that I am speaking with the correct person using two identifiers. Patient stated  full name and DOB. Patient gave permission to continue with telephone visit. Patient's location was at home and Nurse's location was at [place] office.   Patient is alert and oriented x3. Patient denies difficulty focusing or concentrating. Patient likes to read and completes computer entries with brain health.  Health Maintenance Due: -PNA and Tdap vaccine- discussed; to be completed with doctor in visit or local pharmacy.  See completed HM at the end of note.   Eye: Visual acuity not assessed. Virtual visit. Followed by their ophthalmologist.  Dental: Visits every 12 months.    Hearing: Demonstrates normal hearing during visit.  Safety:  Patient feels safe at home- yes Patient does have smoke detectors at home- yes Patient does wear sunscreen or protective clothing when in direct sunlight - yes Patient does wear seat belt when in a moving vehicle - yes Patient drives- yes Adequate lighting in walkways free from debris- yes Grab bars and handrails used as appropriate- yes Ambulates with an assistive device- no Cell phone on person when ambulating outside of the home- yes  Social: Alcohol intake - no     Smoking history- never   Smokers in home? none Illicit drug use? none  Medication: Taking as directed and without issues.  Self managed - yes  Covid-19: Precautions and sickness symptoms discussed. Wears mask, social distancing, hand hygiene as appropriate.   Activities of Daily Living Patient denies needing assistance with: household chores, feeding themselves, getting from bed to chair, getting to the toilet, bathing/showering, dressing, managing money, or preparing meals.   Discussed the importance of a healthy diet, water intake and the benefits of aerobic exercise.   Physical activity- yard work, stair climbing   Diet:  Regular. She tries to eat  healthy.  Water: good intake- 48-64 ounces Caffeine: very little  Other Providers Patient Care  Team: McLean-Scocuzza, Pasty Spillers, MD as PCP - General (Internal Medicine) Iran Ouch, MD as PCP - Cardiology (Cardiology) Kieth Brightly, MD (General Surgery)  Exercise Activities and Dietary recommendations Current Exercise Habits: Home exercise routine, Intensity: Mild  Goals      Patient Stated   . Increase physical activity (pt-stated)     Increase use of stationary bike 3 days, 20 minutes Continue yard work 2-3 days weekly, 2-3 hours       Fall Risk Fall Risk  09/29/2019 02/01/2019 09/28/2018 01/19/2018 09/18/2017  Falls in the past year? 0 1 0 No No  Number falls in past yr: - 0 - - -  Injury with Fall? - 0 - - -  Follow up Falls evaluation completed Falls evaluation completed - - -   Timed Get Up and Go performed: no, virtual visit  Depression Screen PHQ 2/9 Scores 09/29/2019 09/28/2018 01/19/2018 09/18/2017  PHQ - 2 Score 0 0 0 0     Cognitive Function MMSE - Mini Mental State Exam 09/24/2017  Orientation to time 5  Orientation to Place 5  Registration 3  Attention/ Calculation 5  Recall 3  Language- name 2 objects 2  Language- repeat 1  Language- follow 3 step command 3  Language- read & follow direction 1  Write a sentence 1  Copy design 1  Total score 30     6CIT Screen 09/29/2019 09/28/2018  What Year? 0 points 0 points  What month? 0 points 0 points  What time? 0 points 0 points  Count back from 20 - 0 points  Months in reverse 0 points 0 points  Repeat phrase - 0 points  Total Score - 0    Immunization History  Administered Date(s) Administered  . Moderna SARS-COVID-2 Vaccination 06/30/2019, 07/28/2019    Screening Tests Health Maintenance  Topic Date Due  . TETANUS/TDAP  Never done  . PNA vac Low Risk Adult (1 of 2 - PCV13) Never done  . INFLUENZA VACCINE  12/18/2019  . MAMMOGRAM  08/01/2020  . COLONOSCOPY  03/22/2028  . DEXA SCAN  Completed  . COVID-19 Vaccine  Completed  . Hepatitis C Screening  Completed      Plan:   Keep all  routine maintenance appointments.   Follow up 09/30/19 @ 1:30  Medicare Attestation I have personally reviewed: The patient's medical and social history Their use of alcohol, tobacco or illicit drugs Their current medications and supplements The patient's functional ability including ADLs,fall risks, home safety risks, cognitive, and hearing and visual impairment Diet and physical activities Evidence for depression   I have reviewed and discussed with patient certain preventive protocols, quality metrics, and best practice recommendations.     Ashok Pall, LPN  [id number]/[id number]    Agree  tMS

## 2019-09-29 NOTE — Patient Instructions (Addendum)
  Ms. Dorsi , Thank you for taking time to come for your Medicare Wellness Visit. I appreciate your ongoing commitment to your health goals. Please review the following plan we discussed and let me know if I can assist you in the future.   These are the goals we discussed: Goals      Patient Stated   . Increase physical activity (pt-stated)     Increase use of stationary bike 3 days, 20 minutes Continue yard work 2-3 days weekly, 2-3 hours       This is a list of the screening recommended for you and due dates:  Health Maintenance  Topic Date Due  . Tetanus Vaccine  Never done  . Pneumonia vaccines (1 of 2 - PCV13) Never done  . Flu Shot  12/18/2019  . Mammogram  08/01/2020  . Colon Cancer Screening  03/22/2028  . DEXA scan (bone density measurement)  Completed  . COVID-19 Vaccine  Completed  .  Hepatitis C: One time screening is recommended by Center for Disease Control  (CDC) for  adults born from [birth year] through 1963.   Completed

## 2019-09-30 ENCOUNTER — Other Ambulatory Visit: Payer: Self-pay

## 2019-09-30 ENCOUNTER — Encounter: Payer: Self-pay | Admitting: Internal Medicine

## 2019-09-30 ENCOUNTER — Ambulatory Visit: Payer: Medicare PPO | Admitting: Internal Medicine

## 2019-09-30 VITALS — BP 132/84 | HR 60 | Temp 97.3°F | Ht 62.0 in | Wt 183.4 lb

## 2019-09-30 DIAGNOSIS — Z23 Encounter for immunization: Secondary | ICD-10-CM

## 2019-09-30 DIAGNOSIS — D72819 Decreased white blood cell count, unspecified: Secondary | ICD-10-CM

## 2019-09-30 DIAGNOSIS — I471 Supraventricular tachycardia: Secondary | ICD-10-CM

## 2019-09-30 DIAGNOSIS — K2980 Duodenitis without bleeding: Secondary | ICD-10-CM | POA: Diagnosis not present

## 2019-09-30 DIAGNOSIS — R030 Elevated blood-pressure reading, without diagnosis of hypertension: Secondary | ICD-10-CM

## 2019-09-30 DIAGNOSIS — Z1389 Encounter for screening for other disorder: Secondary | ICD-10-CM

## 2019-09-30 DIAGNOSIS — S81801A Unspecified open wound, right lower leg, initial encounter: Secondary | ICD-10-CM

## 2019-09-30 DIAGNOSIS — R7303 Prediabetes: Secondary | ICD-10-CM

## 2019-09-30 DIAGNOSIS — Z1231 Encounter for screening mammogram for malignant neoplasm of breast: Secondary | ICD-10-CM | POA: Diagnosis not present

## 2019-09-30 DIAGNOSIS — E785 Hyperlipidemia, unspecified: Secondary | ICD-10-CM | POA: Diagnosis not present

## 2019-09-30 MED ORDER — OMEPRAZOLE 20 MG PO CPDR: 20.0000 mg | DELAYED_RELEASE_CAPSULE | Freq: Every day | ORAL | 3 refills | Status: DC

## 2019-09-30 MED ORDER — MUPIROCIN 2 % EX OINT: 1.0000 "application " | TOPICAL_OINTMENT | Freq: Three times a day (TID) | CUTANEOUS | 0 refills | Status: DC

## 2019-09-30 MED ORDER — PRAVASTATIN SODIUM 10 MG PO TABS: 10.0000 mg | ORAL_TABLET | Freq: Every day | ORAL | 3 refills | Status: DC

## 2019-09-30 NOTE — Progress Notes (Signed)
Chief Complaint  Patient presents with  . Follow-up   F/u  1. Svt 08/26/19 and 07/31/19 lasting 2-4 hrs on metoprolol 12.5 mg bid saw Dr. Clayborn Bigness pending ablation 10/12/19 not able to tolerate higher doses of BB  2. Right ring finger trigger finger worse x 1 week and sleeps with hand balled up and ring finger catches at times  3. HLD agreeable to statin today  4. Prediabetes 5. Duodenitis was on prilose 40 mg qd wants to know how long to continue    Review of Systems  Constitutional: Negative for weight loss.  HENT: Negative for hearing loss.   Eyes: Negative for blurred vision.  Respiratory: Negative for shortness of breath.   Cardiovascular: Negative for chest pain and palpitations.  Gastrointestinal: Negative for abdominal pain.  Musculoskeletal: Negative for falls.       +trigger finger   Skin: Negative for rash.  Psychiatric/Behavioral: Negative for depression.   Past Medical History:  Diagnosis Date  . Arthritis   . Atypical chest pain    a. 04/2015 Myoview: EF 78%, breast attenuation, no ischemia-->Low risk.  . Back pain    scoliosis  . Chicken pox   . Dysrhythmia    hx palpitations  . GERD (gastroesophageal reflux disease)   . History of hiatal hernia    noted on cxr  . Hyperlipidemia   . Joint pain   . Obesity, unspecified   . Paroxysmal SVT (supraventricular tachycardia) ([place])    a. 2013 Holter: PACs/PVCs; b. 10/2011 Ehco: EF nl, no rwma, mild LVH, mild TR, PASP 54mHg; c 04/2015 SVT in ED->resolved with adenosine; c.   . Pneumonia 2010   hx of  . Scoliosis    multiple areas of back follows with Dr. KBoston Servicechiropractor    Past Surgical History:  Procedure Laterality Date  . ABDOMINAL HYSTERECTOMY     1990 ovaries intact. had 2/2 fibroids last pap 2004 neg.  ? if cervix present   . BACK SURGERY  1960's   d/t scoliosis-1964/65?  .Marland KitchenBACK SURGERY     x 3   . BREAST BIOPSY Left 2008   CORE W/CLIP - NEG  . BREAST BIOPSY     2000   . BREAST EXCISIONAL  BIOPSY Right 20 + yrs ago  . BREAST SURGERY  1990   excision breast mass  . COLONOSCOPY  2007   Dr. EVira Agar . COLONOSCOPY WITH ESOPHAGOGASTRODUODENOSCOPY (EGD) AND ESOPHAGEAL DILATION (ED)    . COLONOSCOPY WITH PROPOFOL N/A 03/22/2018   Procedure: COLONOSCOPY WITH PROPOFOL;  Surgeon: EManya Silvas MD;  [facility]  Service: Endoscopy;  Laterality: N/A;  . ESOPHAGOGASTRODUODENOSCOPY (EGD) WITH PROPOFOL N/A 03/22/2018   Procedure: ESOPHAGOGASTRODUODENOSCOPY (EGD) WITH PROPOFOL;  Surgeon: EManya Silvas MD;  [facility];  Service: Endoscopy;  Laterality: N/A;  . EYE SURGERY  2007   cataracts  . FOOT SURGERY  1960's  . JOINT REPLACEMENT     total hip left with metal hardward 2016 Dr. RJudd Lienortho   . OTHER SURGICAL HISTORY  2004   Bilateral cataracts  . PARTIAL HYSTERECTOMY  1990  . REVISION TOTAL HIP ARTHROPLASTY Left 04/17/2014   DR RMayer Camel . TOTAL HIP ARTHROPLASTY  2006  . TOTAL HIP REVISION Left 04/17/2014   Procedure: LEFT TOTAL HIP REVISION;  Surgeon: FKerin Salen MD;  [facility]  Service: Orthopedics;  Laterality: Left;  . TOTAL KNEE ARTHROPLASTY Right 10/30/2017   Procedure: RIGHT TOTAL KNEE ARTHROPLASTY;  Surgeon:  Frederik Pear, MD;  Location: [facility];  Service: Orthopedics;  Laterality: Right;   Family History  Problem Relation Age of Onset  . Heart attack Father   . Heart disease Father        MI  . Diabetes Father   . Hypertension Father   . Early death Father   . Hyperlipidemia Father   . Cancer Mother        breast cancer  . Breast cancer Mother 36  . Arthritis Mother   . Dementia Mother   . Varicose Veins Mother   . Aneurysm Brother        cardiac   . Early death Brother   . Colon cancer Cousin    Social History   Socioeconomic History  . Marital status: Married    Spouse name: Not on file  . Number of children: Not on file  . Years of education: Not on file  . Highest education level: Not on file   Occupational History  . Not on file  Tobacco Use  . Smoking status: Never Smoker  . Smokeless tobacco: Never Used  Substance and Sexual Activity  . Alcohol use: Not Currently    Comment: rare  . Drug use: No  . Sexual activity: Yes    Birth control/protection: Surgical  Other Topics Concern  . 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 Difficulty of Paying Living Expenses:   Food Insecurity:   . Worried About Charity fundraiser in the Last Year:   . Arboriculturist in the Last Year:   Transportation Needs:   . Film/video editor (Medical):   Marland Kitchen Lack of Transportation (Non-Medical):   Physical Activity:   . Days of Exercise per Week:   . Minutes of Exercise per Session:   Stress:   . Feeling of Stress :   Social Connections:   . Frequency of Communication with Friends and Family:   . Frequency of Social Gatherings with Friends and Family:   . Attends Religious Services:   . Active Member of Clubs or Organizations:   . Attends Archivist Meetings:   Marland Kitchen Marital Status:   Intimate Partner Violence:   . Fear of Current or Ex-Partner:   . Emotionally Abused:   Marland Kitchen Physically Abused:   . Sexually Abused:    Current Meds  Medication Sig  . aspirin EC 81 MG tablet Take 81 mg by mouth daily.  . meloxicam (MOBIC) 15 MG tablet Take 15 mg by mouth daily as needed.   . metoprolol tartrate (LOPRESSOR) 25 MG tablet Take 0.5 tablets (12.5 mg total) by mouth 2 (two) times daily.   Allergies  Allergen Reactions  . Diltiazem Rash    CD formulation likely generalized drug rash/eruption face, trunk, arms   . Metoprolol Succinate [Metoprolol] Other (See Comments)    Succinate version causes extreme lethargy   . Tape Itching, Rash and Other (See Comments)    Reaction: blisters (adhesive tape) Paper tape  is ok   Recent Results (from the past 2160 hour(s))  NM Myocar Multi W/Spect W/Wall Motion / EF     Status: None   Collection Time: 07/20/19 10:58 AM  Result Value Ref Range   Rest HR 69 bpm  Rest BP 140/87 mmHg   Exercise duration (sec) 0 sec   Percent HR 82 %   Exercise duration (min) 0 min   Estimated workload 1.0 METS   Peak HR 123 bpm   Peak BP 159/76 mmHg   MPHR 149 bpm   SSS 7    SRS 18    SDS 0    TID 1.04    LV sys vol 9 mL   LV dias vol 46 46 - 106 mL  Troponin I (High Sensitivity)     Status: None   Collection Time: 07/31/19  1:50 PM  Result Value Ref Range   Troponin I (High Sensitivity) 16 <18 ng/L    Comment: (NOTE) Elevated high sensitivity troponin I (hsTnI) values and significant  changes across serial measurements may suggest ACS but many other  chronic and acute conditions are known to elevate hsTnI results.  Refer to the "Links" section for chest pain algorithms and additional  guidance. Performed at [facility], [address] [place], [place] [address]   Comprehensive metabolic panel     Status: Abnormal   Collection Time: 07/31/19  1:50 PM  Result Value Ref Range   Sodium 139 135 - 145 mmol/L   Potassium 4.0 3.5 - 5.1 mmol/L   Chloride 104 98 - 111 mmol/L   CO2 26 22 - 32 mmol/L   Glucose, Bld 179 (H) 70 - 99 mg/dL    Comment: Glucose reference range applies only to samples taken after fasting for at least 8 hours.   BUN 18 8 - 23 mg/dL   Creatinine, Ser 0.91 0.44 - 1.00 mg/dL   Calcium 9.1 8.9 - 10.3 mg/dL   Total Protein 6.8 6.5 - 8.1 g/dL   Albumin 3.9 3.5 - 5.0 g/dL   AST 57 (H) 15 - 41 U/L   ALT 34 0 - 44 U/L   Alkaline Phosphatase 93 38 - 126 U/L   Total Bilirubin 1.0 0.3 - 1.2 mg/dL   GFR calc non Af Amer >60 >60 mL/min   GFR calc Af Amer >60 >60 mL/min   Anion gap 9 5 - 15    Comment: Performed at [facility], [address] [place], [place] [address]  CBC     Status: Abnormal   Collection Time: 07/31/19   1:52 PM  Result Value Ref Range   WBC 3.7 (L) 4.0 - 10.5 K/uL   RBC 5.02 3.87 - 5.11 MIL/uL   Hemoglobin 15.0 12.0 - 15.0 g/dL   HCT 47.3 (H) 36.0 - 46.0 %   MCV 94.2 80.0 - 100.0 fL   MCH 29.9 26.0 - 34.0 pg   MCHC 31.7 30.0 - 36.0 g/dL   RDW 13.4 11.5 - 15.5 %   Platelets 254 150 - 400 K/uL   nRBC 0.0 0.0 - 0.2 %    Comment: Performed at [facility], [address] [place], [place] [address]  Troponin I (High Sensitivity)     Status: Abnormal   Collection Time: 07/31/19  4:01 PM  Result Value Ref Range   Troponin I (High Sensitivity) 46 (H) <18 ng/L    Comment: READ BACK AND VERIFIED WITH ALEX CARRICO AT 6295 07/31/19.PMF (NOTE) Elevated high sensitivity troponin I (hsTnI) values and significant  changes across serial measurements may suggest ACS but many other  chronic and acute conditions are known to elevate hsTnI results.  Refer to the "Links" section for chest pain algorithms and additional  guidance. Performed at  [facility], [place], [place] [address]   SARS CORONAVIRUS 2 (TAT 6-24 HRS) Nasopharyngeal Nasopharyngeal Swab     Status: None   Collection Time: 07/31/19  6:10 PM   Specimen: Nasopharyngeal Swab  Result Value Ref Range   SARS Coronavirus 2 NEGATIVE NEGATIVE    Comment: (NOTE) SARS-CoV-2 target nucleic acids are NOT DETECTED. The SARS-CoV-2 RNA is generally detectable in upper and lower respiratory specimens during the acute phase of infection. Negative results do not preclude SARS-CoV-2 infection, do not rule out co-infections with other pathogens, and should not be used as the sole basis for treatment or other patient management decisions. Negative results must be combined with clinical observations, patient history, and epidemiological information. The expected result is Negative. Fact Sheet for Patients: SugarRoll.be Fact Sheet for Healthcare Providers:  https://www.woods-mathews.com/ This test is not yet approved or cleared by the [place] FDA and  has been authorized for detection and/or diagnosis of SARS-CoV-2 by FDA under an Emergency Use Authorization (EUA). This EUA will remain  in effect (meaning this test can be used) for the duration of the COVID-19 declaration under Section 56 4(b)(1) of the Act, 21 U.S.C. section 360bbb-3(b)(1), unless the authorization is terminated or revoked sooner. Performed at [facility], [place] [address] [place], [place] [address]   Troponin I (High Sensitivity)     Status: Abnormal   Collection Time: 07/31/19  8:54 PM  Result Value Ref Range   Troponin I (High Sensitivity) 83 (H) <18 ng/L    Comment: READ BACK AND VERIFIED WITH SHERRI WORTH ON 07/31/19 AT [address] [employer] (NOTE) Elevated high sensitivity troponin I (hsTnI) values and significant  changes across serial measurements may suggest ACS but many other  chronic and acute conditions are known to elevate hsTnI results.  Refer to the "Links" section for chest pain algorithms and additional  guidance. Performed at [facility], [address] [place], [place] [address]   CBC     Status: None   Collection Time: 08/01/19  1:42 AM  Result Value Ref Range   WBC 4.8 4.0 - 10.5 K/uL   RBC 4.34 3.87 - 5.11 MIL/uL   Hemoglobin 13.5 12.0 - 15.0 g/dL   HCT 40.4 36.0 - 46.0 %   MCV 93.1 80.0 - 100.0 fL   MCH 31.1 26.0 - 34.0 pg   MCHC 33.4 30.0 - 36.0 g/dL   RDW 13.3 11.5 - 15.5 %   Platelets 208 150 - 400 K/uL   nRBC 0.0 0.0 - 0.2 %    Comment: Performed at [facility], [address] [place], [place] [address]  Basic metabolic panel     Status: Abnormal   Collection Time: 08/01/19  1:42 AM  Result Value Ref Range   Sodium 140 135 - 145 mmol/L   Potassium 3.8 3.5 - 5.1 mmol/L   Chloride 109 98 - 111 mmol/L   CO2 26 22 - 32 mmol/L   Glucose, Bld 142 (H) 70 - 99 mg/dL    Comment: Glucose reference range  applies only to samples taken after fasting for at least 8 hours.   BUN 20 8 - 23 mg/dL   Creatinine, Ser 0.64 0.44 - 1.00 mg/dL   Calcium 8.5 (L) 8.9 - 10.3 mg/dL   GFR calc non Af Amer >60 >60 mL/min   GFR calc Af Amer >60 >60 mL/min   Anion gap 5 5 - 15    Comment: Performed at [facility], [place], [place] [address]  TSH     Status: None   Collection Time: 08/01/19  1:42 AM  Result Value Ref Range   TSH 1.875 0.350 - 4.500 uIU/mL    Comment: Performed by a 3rd Generation assay with a functional sensitivity of <=0.01 uIU/mL. Performed at [facility], [address] [place], [place] [address]   Hemoglobin A1c     Status: Abnormal   Collection Time: 08/01/19  1:42 AM  Result Value Ref Range   Hgb A1c MFr Bld 5.7 (H) 4.8 - 5.6 %    Comment: (NOTE) Pre diabetes:          5.7%-6.4% Diabetes:              >6.4% Glycemic control for   <7.0% adults with diabetes    Mean Plasma Glucose 116.89 mg/dL    Comment: Performed at [facility] [address], [place], [place] [address]  Troponin I (High Sensitivity)     Status: Abnormal   Collection Time: 08/01/19  1:42 AM  Result Value Ref Range   Troponin I (High Sensitivity) 77 (H) <18 ng/L    Comment: (NOTE) Elevated high sensitivity troponin I (hsTnI) values and significant  changes across serial measurements may suggest ACS but many other  chronic and acute conditions are known to elevate hsTnI results.  Refer to the "Links" section for chest pain algorithms and additional  guidance. Performed at [facility], [place], [place] [address]   Troponin I (High Sensitivity)     Status: Abnormal   Collection Time: 08/01/19  3:37 AM  Result Value Ref Range   Troponin I (High Sensitivity) 66 (H) <18 ng/L    Comment: (NOTE) Elevated high sensitivity troponin I (hsTnI) values and significant  changes across serial measurements may suggest ACS but many other  chronic  and acute conditions are known to elevate hsTnI results.  Refer to the "Links" section for chest pain algorithms and additional  guidance. Performed at [facility], [address] [place], [place] [address]   Basic metabolic panel     Status: Abnormal   Collection Time: 08/26/19  6:31 PM  Result Value Ref Range   Sodium 139 135 - 145 mmol/L   Potassium 4.1 3.5 - 5.1 mmol/L   Chloride 106 98 - 111 mmol/L   CO2 26 22 - 32 mmol/L   Glucose, Bld 130 (H) 70 - 99 mg/dL    Comment: Glucose reference range applies only to samples taken after fasting for at least 8 hours.   BUN 20 8 - 23 mg/dL   Creatinine, Ser 0.83 0.44 - 1.00 mg/dL   Calcium 9.3 8.9 - 10.3 mg/dL   GFR calc non Af Amer >60 >60 mL/min   GFR calc Af Amer >60 >60 mL/min   Anion gap 7 5 - 15    Comment: Performed at [facility], [address] [place], [place] [address]  CBC     Status: None   Collection Time: 08/26/19  6:31 PM  Result Value Ref Range   WBC 4.6 4.0 - 10.5 K/uL   RBC 4.69 3.87 - 5.11 MIL/uL   Hemoglobin 14.2 12.0 - 15.0 g/dL   HCT 43.9 36.0 - 46.0 %   MCV 93.6 80.0 - 100.0 fL   MCH 30.3 26.0 - 34.0 pg   MCHC 32.3 30.0 - 36.0 g/dL   RDW 13.2 11.5 - 15.5 %  Platelets 243 150 - 400 K/uL   nRBC 0.0 0.0 - 0.2 %    Comment: Performed at [facility], [address] [place], [place] [address]   Objective  Body mass index is 33.54 kg/m. Wt Readings from Last 3 Encounters:  09/30/19 183 lb 6.4 oz (83.2 kg)  09/29/19 180 lb (81.6 kg)  08/26/19 180 lb (81.6 kg)   Temp Readings from Last 3 Encounters:  09/30/19 (!) 97.3 F (36.3 C) (Temporal)  08/26/19 98.5 F (36.9 C) (Oral)  08/25/19 (!) 97.3 F (36.3 C)   BP Readings from Last 3 Encounters:  09/30/19 132/84  08/26/19 103/69  08/11/19 136/84   Pulse Readings from Last 3 Encounters:  09/30/19 60  08/26/19 60  08/11/19 83    Physical Exam Vitals and nursing note reviewed.  Constitutional:      Appearance:  Normal appearance. She is well-developed and well-groomed. She is obese.  HENT:     Head: Normocephalic and atraumatic.  Eyes:     Conjunctiva/sclera: Conjunctivae normal.     Pupils: Pupils are equal, round, and reactive to light.  Cardiovascular:     Rate and Rhythm: Normal rate and regular rhythm.     Heart sounds: Normal heart sounds. No murmur.  Pulmonary:     Effort: Pulmonary effort is normal.     Breath sounds: Normal breath sounds.  Skin:    General: Skin is warm and dry.       Neurological:     General: No focal deficit present.     Mental Status: She is alert and oriented to person, place, and time. Mental status is at baseline.     Gait: Gait normal.  Psychiatric:        Attention and Perception: Attention and perception normal.        Mood and Affect: Mood and affect normal.        Speech: Speech normal.        Behavior: Behavior normal. Behavior is cooperative.        Thought Content: Thought content normal.        Cognition and Memory: Cognition and memory normal.        Judgment: Judgment normal.     Assessment  Plan  SVT (supraventricular tachycardia) (HCC) Cont metop 12.5 mg bid  Ablation sch [facility] cards 10/12/19   Hyperlipidemia, unspecified hyperlipidemia type - Plan: pravastatin (PRAVACHOL) 10 MG tablet  Duodenitis - Plan: omeprazole (PRILOSEC) 20 MG capsule  Wound of right lower extremity, initial encounter - Plan: mupirocin ointment (BACTROBAN) 2 % Tdap today   Prediabetes  Check fasting labs 01/2019   HM Declines flu shot  Given prevnar rx Tdap vaccine given today for left leg wound x 2 weeks ago scraped leg on pot  Disc shingrixprior appt  Consider hep B vaccine not immune Immune MMR Hep C neg  Colonoscopy 2016 Dr. Juanita Craver need to get records. -EGD/colonoscopy 03/22/2018 Dr. Cordelia Pen hemorrhoids f/u in 10 years and EGD moderate HH, duodenitis omeprazole 40 mg qd x 1 year; no bxs, esophagus dilated mammorepeat 08/06/18  negativeordered No need paps/p hysterectomy fibroids no h/o abnormal pap DEXA 06/07/15 normal A1C6.0 01/26/19  Never smoker Provider: Dr. Olivia Mackie McLean-Scocuzza-Internal Medicine

## 2019-09-30 NOTE — Patient Instructions (Addendum)
Pravastatin tablets What is this medicine? PRAVASTATIN (PRA va stat in) is known as a HMG-CoA reductase inhibitor or 'statin'. It lowers the level of cholesterol and triglycerides in the blood. This drug may also reduce the risk of heart attack, stroke, or other health problems in patients with risk factors for heart disease. Diet and lifestyle changes are often used with this drug. This medicine may be used for other purposes; ask your health care provider or pharmacist if you have questions. COMMON BRAND NAME(S): Pravachol What should I tell my health care provider before I take this medicine? They need to know if you have any of these conditions:  diabetes  if you often drink alcohol  history of stroke  kidney disease  liver disease  muscle aches or weakness  thyroid disease  an unusual or allergic reaction to pravastatin, other medicines, foods, dyes, or preservatives  pregnant or trying to get pregnant  breast-feeding How should I use this medicine? Take pravastatin tablets by mouth. Swallow the tablets with a drink of water. Pravastatin can be taken at anytime of the day, with or without food. Follow the directions on the prescription label. Take your doses at regular intervals. Do not take your medicine more often than directed. Talk to your pediatrician regarding the use of this medicine in children. Special care may be needed. Pravastatin has been used in children as young as 558 years of age. Overdosage: If you think you have taken too much of this medicine contact a poison control center or emergency room at once. NOTE: This medicine is only for you. Do not share this medicine with others. What if I miss a dose? If you miss a dose, take it as soon as you can. If it is almost time for your next dose, take only that dose. Do not take double or extra doses. What may interact with this medicine? This medicine may interact with the following  medications:  colchicine  cyclosporine  other medicines for high cholesterol  some antibiotics like azithromycin, clarithromycin, erythromycin, and telithromycin This list may not describe all possible interactions. Give your health care provider a list of all the medicines, herbs, non-prescription drugs, or dietary supplements you use. Also tell them if you smoke, drink alcohol, or use illegal drugs. Some items may interact with your medicine. What should I watch for while using this medicine? Visit your doctor or health care professional for regular check-ups. You may need regular tests to make sure your liver is working properly. Your health care professional may tell you to stop taking this medicine if you develop muscle problems. If your muscle problems do not go away after stopping this medicine, contact your health care professional. Do not become pregnant while taking this medicine. Women should inform their health care professional if they wish to become pregnant or think they might be pregnant. There is a potential for serious side effects to an unborn child. Talk to your health care professional or pharmacist for more information. Do not breast-feed an infant while taking this medicine. This medicine may affect blood sugar levels. If you have diabetes, check with your doctor or health care professional before you change your diet or the dose of your diabetic medicine. If you are going to need surgery or other procedure, tell your doctor that you are using this medicine. This drug is only part of a total heart-health program. Your doctor or a dietician can suggest a low-cholesterol and low-fat diet to help. Avoid alcohol and  smoking, and keep a proper exercise schedule. This medicine may cause a decrease in Co-Enzyme Q-10. You should make sure that you get enough Co-Enzyme Q-10 while you are taking this medicine. Discuss the foods you eat and the vitamins you take with your health care  professional. What side effects may I notice from receiving this medicine? Side effects that you should report to your doctor or health care professional as soon as possible:  allergic reactions like skin rash, itching or hives, swelling of the face, lips, or tongue  dark urine  fever  muscle pain, cramps, or weakness  redness, blistering, peeling or loosening of the skin, including inside the mouth  trouble passing urine or change in the amount of urine  unusually weak or tired  yellowing of the eyes or skin Side effects that usually do not require medical attention (report to your doctor or health care professional if they continue or are bothersome):  gas  headache  heartburn  indigestion  stomach pain This list may not describe all possible side effects. Call your doctor for medical advice about side effects. You may report side effects to FDA at 1-800-FDA-1088. Where should I keep my medicine? Keep out of the reach of children. Store at room temperature between 15 to 30 degrees C (59 to 86 degrees F). Protect from light. Keep container tightly closed. Throw away any unused medicine after the expiration date. NOTE: This sheet is a summary. It may not cover all possible information. If you have questions about this medicine, talk to your doctor, pharmacist, or health care provider.  2020 Elsevier/Gold Standard (2017-01-06 12:37:09)  Cardiac Ablation  Cardiac ablation is a procedure to stop some heart tissue from causing problems. The heart has many electrical connections. Sometimes these connections make the heart beat very fast or irregularly. Removing some problem areas can improve the heart rhythm or make it normal. What happens before the procedure?  Follow instructions from your doctor about what you cannot eat or drink.  Ask your doctor about: ? Changing or stopping your normal medicines. This is important if you take diabetes medicines or blood  thinners. ? Taking medicines such as aspirin and ibuprofen. These medicines can thin your blood. Do not take these medicines before your procedure if your doctor tells you not to.  Plan to have someone take you home.  If you will be going home right after the procedure, plan to have someone with you for 24 hours. What happens during the procedure?  To lower your risk of infection: ? Your health care team will wash or sanitize their hands. ? Your skin will be washed with soap. ? Hair may be removed from your neck or groin.  An IV tube will be put into one of your veins.  You will be given a medicine to help you relax (sedative).  Skin on your neck or groin will be numbed.  A cut (incision) will be made in your neck or groin.  A needle will be put through your cut and into a vein in your neck or groin.  A tube (catheter) will be put into the needle. The tube will be moved to your heart. X-rays (fluoroscopy) will be used to help guide the tube.  Small devices (electrodes) on the tip of the tube will send out electrical currents.  Dye may be put through the tube. This helps your surgeon see your heart.  Electrical energy will be used to scar (ablate) some heart tissue. Your  surgeon may use: ? Heat (radiofrequency energy). ? Laser energy. ? Extreme cold (cryoablation).  The tube will be taken out.  Pressure will be held on your cut. This helps stop bleeding.  A bandage (dressing) will be put on your cut. The procedure may vary. What happens after the procedure?  You will be monitored until your medicines have worn off.  Your cut will be watched for bleeding. You will need to lie still for a few hours.  Do not drive for 24 hours or as long as your doctor tells you. Summary  Cardiac ablation is a procedure to stop some heart tissue from causing problems.  Electrical energy will be used to scar (ablate) some heart tissue. This information is not intended to replace advice  given to you by your health care provider. Make sure you discuss any questions you have with your health care provider. Document Revised: 04/17/2017 Document Reviewed: 03/24/2016 Elsevier Patient Education  2020 Else[employer] Inc.    Prediabetes Eating Plan Prediabetes is a condition that causes blood sugar (glucose) levels to be higher than normal. This increases the risk for developing diabetes. In order to prevent diabetes from developing, your health care provider may recommend a diet and other lifestyle changes to help you: Control your blood glucose levels. Improve your cholesterol levels. Manage your blood pressure. Your health care provider may recommend working with a diet and nutrition specialist (dietitian) to make a meal plan that is best for you. What are tips for following this plan? Lifestyle Set weight loss goals with the help of your health care team. It is recommended that most people with prediabetes lose 7% of their current body weight. Exercise for at least 30 minutes at least 5 days a week. Attend a support group or seek ongoing support from a mental health counselor. Take over-the-counter and prescription medicines only as told by your health care provider. Reading food labels Read food labels to check the amount of fat, salt (sodium), and sugar in prepackaged foods. Avoid foods that have: Saturated fats. Trans fats. Added sugars. Avoid foods that have more than 300 milligrams (mg) of sodium per serving. Limit your daily sodium intake to less than 2,300 mg each day. Shopping Avoid buying pre-made and processed foods. Cooking Cook with olive oil. Do not use butter, lard, or ghee. Bake, broil, grill, or boil foods. Avoid frying. Meal planning  Work with your dietitian to develop an eating plan that is right for you. This may include: Tracking how many calories you take in. Use a food diary, notebook, or mobile application to track what you eat at each meal. Using the  glycemic index (GI) to plan your meals. The index tells you how quickly a food will raise your blood glucose. Choose low-GI foods. These foods take a longer time to raise blood glucose. Consider following a Mediterranean diet. This diet includes: Several servings each day of fresh fruits and vegetables. Eating fish at least twice a week. Several servings each day of whole grains, beans, nuts, and seeds. Using olive oil instead of other fats. Moderate alcohol consumption. Eating small amounts of red meat and whole-fat dairy. If you have high blood pressure, you may need to limit your sodium intake or follow a diet such as the DASH eating plan. DASH is an eating plan that aims to lower high blood pressure. What foods are recommended? The items listed below may not be a complete list. Talk with your dietitian about what dietary choices are best  for you. Grains Whole grains, such as whole-wheat or whole-grain breads, crackers, cereals, and pasta. Unsweetened oatmeal. Bulgur. Barley. Quinoa. Brown rice. Corn or whole-wheat flour tortillas or taco shells. Vegetables Lettuce. Spinach. Peas. Beets. Cauliflower. Cabbage. Broccoli. Carrots. Tomatoes. Squash. Eggplant. Herbs. Peppers. Onions. Cucumbers. Brussels sprouts. Fruits Berries. Bananas. Apples. Oranges. Grapes. Papaya. Mango. Pomegranate. Kiwi. Grapefruit. Cherries. Meats and other protein foods Seafood. Poultry without skin. Lean cuts of pork and beef. Tofu. Eggs. Nuts. Beans. Dairy Low-fat or fat-free dairy products, such as yogurt, cottage cheese, and cheese. Beverages Water. Tea. Coffee. Sugar-free or diet soda. Seltzer water. Lowfat or no-fat milk. Milk alternatives, such as soy or almond milk. Fats and oils Olive oil. Canola oil. Sunflower oil. Grapeseed oil. Avocado. Walnuts. Sweets and desserts Sugar-free or low-fat pudding. Sugar-free or low-fat ice cream and other frozen treats. Seasoning and other foods Herbs. Sodium-free spices.  Mustard. Relish. Low-fat, low-sugar ketchup. Low-fat, low-sugar barbecue sauce. Low-fat or fat-free mayonnaise. What foods are not recommended? The items listed below may not be a complete list. Talk with your dietitian about what dietary choices are best for you. Grains Refined white flour and flour products, such as bread, pasta, snack foods, and cereals. Vegetables Canned vegetables. Frozen vegetables with butter or cream sauce. Fruits Fruits canned with syrup. Meats and other protein foods Fatty cuts of meat. Poultry with skin. Breaded or fried meat. Processed meats. Dairy Full-fat yogurt, cheese, or milk. Beverages Sweetened drinks, such as sweet iced tea and soda. Fats and oils Butter. Lard. Ghee. Sweets and desserts Baked goods, such as cake, cupcakes, pastries, cookies, and cheesecake. Seasoning and other foods Spice mixes with added salt. Ketchup. Barbecue sauce. Mayonnaise. Summary To prevent diabetes from developing, you may need to make diet and other lifestyle changes to help control blood sugar, improve cholesterol levels, and manage your blood pressure. Set weight loss goals with the help of your health care team. It is recommended that most people with prediabetes lose 7 percent of their current body weight. Consider following a Mediterranean diet that includes plenty of fresh fruits and vegetables, whole grains, beans, nuts, seeds, fish, lean meat, low-fat dairy, and healthy oils. This information is not intended to replace advice given to you by your health care provider. Make sure you discuss any questions you have with your health care provider. Document Revised: 08/27/2018 Document Reviewed: 07/09/2016 Elsevier Patient Education  [employer].    Hand Exercises Hand exercises can be helpful for almost anyone. These exercises can strengthen the hands, improve flexibility and movement, and increase blood flow to the hands. These results can make work and daily  tasks easier. Hand exercises can be especially helpful for people who have joint pain from arthritis or have nerve damage from overuse (carpal tunnel syndrome). These exercises can also help people who have injured a hand. Exercises Most of these hand exercises are gentle stretching and motion exercises. It is usually safe to do them often throughout the day. Warming up your hands before exercise may help to reduce stiffness. You can do this with gentle massage or by placing your hands in warm water for 10-15 minutes. It is normal to feel some stretching, pulling, tightness, or mild discomfort as you begin new exercises. This will gradually improve. Stop an exercise right away if you feel sudden, severe pain or your pain gets worse. Ask your health care provider which exercises are best for you. Knuckle bend or "claw" fist 1. Stand or sit with your arm, hand, and  all five fingers pointed straight up. Make sure to keep your wrist straight during the exercise. 2. Gently bend your fingers down toward your palm until the tips of your fingers are touching the top of your palm. Keep your big knuckle straight and just bend the small knuckles in your fingers. 3. Hold this position for __________ seconds. 4. Straighten (extend) your fingers back to the starting position. Repeat this exercise 5-10 times with each hand. Full finger fist 1. Stand or sit with your arm, hand, and all five fingers pointed straight up. Make sure to keep your wrist straight during the exercise. 2. Gently bend your fingers into your palm until the tips of your fingers are touching the middle of your palm. 3. Hold this position for __________ seconds. 4. Extend your fingers back to the starting position, stretching every joint fully. Repeat this exercise 5-10 times with each hand. Straight fist 1. Stand or sit with your arm, hand, and all five fingers pointed straight up. Make sure to keep your wrist straight during the  exercise. 2. Gently bend your fingers at the big knuckle, where your fingers meet your hand, and the middle knuckle. Keep the knuckle at the tips of your fingers straight and try to touch the bottom of your palm. 3. Hold this position for __________ seconds. 4. Extend your fingers back to the starting position, stretching every joint fully. Repeat this exercise 5-10 times with each hand. Tabletop 1. Stand or sit with your arm, hand, and all five fingers pointed straight up. Make sure to keep your wrist straight during the exercise. 2. Gently bend your fingers at the big knuckle, where your fingers meet your hand, as far down as you can while keeping the small knuckles in your fingers straight. Think of forming a tabletop with your fingers. 3. Hold this position for __________ seconds. 4. Extend your fingers back to the starting position, stretching every joint fully. Repeat this exercise 5-10 times with each hand. Finger spread 1. Place your hand flat on a table with your palm facing down. Make sure your wrist stays straight as you do this exercise. 2. Spread your fingers and thumb apart from each other as far as you can until you feel a gentle stretch. Hold this position for __________ seconds. 3. Bring your fingers and thumb tight together again. Hold this position for __________ seconds. Repeat this exercise 5-10 times with each hand. Making circles 1. Stand or sit with your arm, hand, and all five fingers pointed straight up. Make sure to keep your wrist straight during the exercise. 2. Make a circle by touching the tip of your thumb to the tip of your index finger. 3. Hold for __________ seconds. Then open your hand wide. 4. Repeat this motion with your thumb and each finger on your hand. Repeat this exercise 5-10 times with each hand. Thumb motion 1. Sit with your forearm resting on a table and your wrist straight. Your thumb should be facing up toward the ceiling. Keep your fingers  relaxed as you move your thumb. 2. Lift your thumb up as high as you can toward the ceiling. Hold for __________ seconds. 3. Bend your thumb across your palm as far as you can, reaching the tip of your thumb for the small finger (pinkie) side of your palm. Hold for __________ seconds. Repeat this exercise 5-10 times with each hand. Grip strengthening  1. Hold a stress ball or other soft ball in the middle of your hand.  2. Slowly increase the pressure, squeezing the ball as much as you can without causing pain. Think of bringing the tips of your fingers into the middle of your palm. All of your finger joints should bend when doing this exercise. 3. Hold your squeeze for __________ seconds, then relax. Repeat this exercise 5-10 times with each hand. Contact a health care provider if:  Your hand pain or discomfort gets much worse when you do an exercise.  Your hand pain or discomfort does not improve within 2 hours after you exercise. If you have any of these problems, stop doing these exercises right away. Do not do them again unless your health care provider says that you can. Get help right away if:  You develop sudden, severe hand pain or swelling. If this happens, stop doing these exercises right away. Do not do them again unless your health care provider says that you can. This information is not intended to replace advice given to you by your health care provider. Make sure you discuss any questions you have with your health care provider. Document Revised: 08/26/2018 Document Reviewed: 05/06/2018 Elsevier Patient Education  2020 [employer] Inc.  Trigger Finger  Trigger finger, also called stenosing tenosynovitis,  is a condition that causes a finger to get stuck in a bent position. Each finger has a tendon, which is a tough, cord-like tissue that connects muscle to bone, and each tendon passes through a tunnel of tissue called a tendon sheath. To move your finger, your tendon needs to  glide freely through the sheath. Trigger finger happens when the tendon or the sheath thickens, making it difficult to move your finger. Trigger finger can affect any finger or a thumb. It may affect more than one finger. Mild cases may clear up with rest and medicine. Severe cases require more treatment. What are the causes? Trigger finger is caused by a thickened finger tendon or tendon sheath. The cause of this thickening is not known. What increases the risk? The following factors may make you more likely to develop this condition:  Doing activities that require a strong grip.  Having rheumatoid arthritis, gout, or diabetes.  Being 26-52 years old.  Being female. What are the signs or symptoms? Symptoms of this condition include:  Pain when bending or straightening your finger.  Tenderness or swelling where your finger attaches to the palm of your hand.  A lump in the palm of your hand or on the inside of your finger.  Hearing a noise like a pop or a snap when you try to straighten your finger.  Feeling a catching or locking sensation when you try to straighten your finger.  Being unable to straighten your finger. How is this diagnosed? This condition is diagnosed based on your symptoms and a physical exam. How is this treated? This condition may be treated by:  Resting your finger and avoiding activities that make symptoms worse.  Wearing a finger splint to keep your finger extended.  Taking NSAIDs, such as ibuprofen, to relieve pain and swelling.  Doing gentle exercises to stretch the finger as told by your health care provider.  Having medicine that reduces swelling and inflammation (steroids) injected into the tendon sheath. Injections may need to be repeated.  Having surgery to open the tendon sheath. This may be done if other treatments do not work and you cannot straighten your finger. You may need physical therapy after surgery. Follow these instructions at  home: If you have a splint:  Wear the splint as told by your health care provider. Remove it only as told by your health care provider.  Loosen it if your fingers tingle, become numb, or turn cold and blue.  Keep it clean.  If the splint is not waterproof: ? Do not let it get wet. ? Cover it with a watertight covering when you take a bath or shower. Managing pain, stiffness, and swelling     If directed, apply heat to the affected area as often as told by your health care provider. Use the heat source that your health care provider recommends, such as a moist heat pack or a heating pad.  Place a towel between your skin and the heat source.  Leave the heat on for 20-30 minutes.  Remove the heat if your skin turns bright red. This is especially important if you are unable to feel pain, heat, or cold. You may have a greater risk of getting burned. If directed, put ice on the painful area. To do this:  If you have a removable splint, remove it as told by your health care provider.  Put ice in a plastic bag.  Place a towel between your skin and the bag or between your splint and the bag.  Leave the ice on for 20 minutes, 2-3 times a day.  Activity  Rest your finger as told by your health care provider. Avoid activities that make the pain worse.  Return to your normal activities as told by your health care provider. Ask your health care provider what activities are safe for you.  Do exercises as told by your health care provider.  Ask your health care provider when it is safe to drive if you have a splint on your hand. General instructions  Take over-the-counter and prescription medicines only as told by your health care provider.  Keep all follow-up visits as told by your health care provider. This is important. Contact a health care provider if:  Your symptoms are not improving with home care. Summary  Trigger finger, also called stenosing tenosynovitis, causes your  finger to get stuck in a bent position. This can make it difficult and painful to straighten your finger.  This condition develops when a finger tendon or tendon sheath thickens.  Treatment may include resting your finger, wearing a splint, and taking medicines.  In severe cases, surgery to open the tendon sheath may be needed. This information is not intended to replace advice given to you by your health care provider. Make sure you discuss any questions you have with your health care provider. Document Revised: 09/20/2018 Document Reviewed: 09/20/2018 Elsevier Patient Education  2020 [employer].

## 2019-09-30 NOTE — Addendum Note (Signed)
Addended by: Tilford Pillar on: 09/30/2019 02:30 PM   Modules accepted: Orders

## 2019-10-10 ENCOUNTER — Telehealth: Payer: Self-pay | Admitting: Internal Medicine

## 2019-10-10 NOTE — Telephone Encounter (Signed)
-----   Message from Stanton Kidney, MD sent at 10/08/2019  5:56 PM EDT ----- Regarding: Regarding med changes Hi - You may try to switch to H2 blocker (Famotidine 20mg  po bid) and if that is tolerated you may try discontinuing that medication after a few months.  I don't advise another EGD unless patient is having some difficulty. ----- Message ----- From: McLean-Scocuzza, , MD Sent: 09/30/2019   2:01 PM EDT To: 10/02/2019, MD  Pt of Dr. Elliots11/2019 EGD duodenitis how long is tx with ppi forever?  And does pt need repeat EGD and when?

## 2019-10-10 NOTE — Telephone Encounter (Signed)
Per Dr. Norma Fredrickson   - You may try to switch to H2 blocker (Pepcid/Famotidine 20mg  po bid this is over the counter) and if that is tolerated you may try discontinuing that medication after a few months.   He does not advise another EGD/upper endoscopy unless patient is having some difficulty  What does she think about stopping Prilosec 20 mg?   TMS

## 2019-10-11 DIAGNOSIS — R002 Palpitations: Secondary | ICD-10-CM | POA: Diagnosis not present

## 2019-10-11 DIAGNOSIS — R7303 Prediabetes: Secondary | ICD-10-CM | POA: Diagnosis not present

## 2019-10-11 DIAGNOSIS — Z79899 Other long term (current) drug therapy: Secondary | ICD-10-CM | POA: Diagnosis not present

## 2019-10-11 DIAGNOSIS — Z01810 Encounter for preprocedural cardiovascular examination: Secondary | ICD-10-CM | POA: Diagnosis not present

## 2019-10-11 DIAGNOSIS — I471 Supraventricular tachycardia: Secondary | ICD-10-CM | POA: Diagnosis not present

## 2019-10-11 DIAGNOSIS — Z20822 Contact with and (suspected) exposure to covid-19: Secondary | ICD-10-CM | POA: Diagnosis not present

## 2019-10-11 DIAGNOSIS — Z5181 Encounter for therapeutic drug level monitoring: Secondary | ICD-10-CM | POA: Diagnosis not present

## 2019-10-11 NOTE — Telephone Encounter (Signed)
Left message to return call 

## 2019-10-11 NOTE — Telephone Encounter (Signed)
Pt returned your call.  

## 2019-10-12 DIAGNOSIS — I491 Atrial premature depolarization: Secondary | ICD-10-CM | POA: Diagnosis not present

## 2019-10-12 DIAGNOSIS — R002 Palpitations: Secondary | ICD-10-CM | POA: Diagnosis not present

## 2019-10-12 DIAGNOSIS — I471 Supraventricular tachycardia: Secondary | ICD-10-CM | POA: Diagnosis not present

## 2019-10-12 NOTE — Telephone Encounter (Signed)
Patient informed and verbalized understanding.  She states she is not having trouble and has already stopped the Protonix. She will try the Pepcid instead.   Med list updated.

## 2019-10-20 ENCOUNTER — Telehealth: Payer: Self-pay | Admitting: Internal Medicine

## 2019-10-20 NOTE — Telephone Encounter (Signed)
Pt called wanted to speak with you about something else.

## 2019-10-21 NOTE — Telephone Encounter (Signed)
Oct 12, 2019 Me  9:56 AM Note   Patient informed and verbalized understanding.  She states she is not having trouble and has already stopped the Protonix. She will try the Pepcid instead.   Med list updated.      Patient calling in regards to this message and wanting clarification as she had forgotten.   Patient informed and verbalized understanding. She will get the Pepcid over the counter

## 2019-12-09 DIAGNOSIS — R002 Palpitations: Secondary | ICD-10-CM | POA: Diagnosis not present

## 2019-12-09 DIAGNOSIS — R0789 Other chest pain: Secondary | ICD-10-CM | POA: Diagnosis not present

## 2019-12-09 DIAGNOSIS — Z01810 Encounter for preprocedural cardiovascular examination: Secondary | ICD-10-CM | POA: Diagnosis not present

## 2019-12-09 DIAGNOSIS — Z9889 Other specified postprocedural states: Secondary | ICD-10-CM | POA: Diagnosis not present

## 2019-12-09 DIAGNOSIS — I471 Supraventricular tachycardia: Secondary | ICD-10-CM | POA: Diagnosis not present

## 2019-12-09 DIAGNOSIS — Z8679 Personal history of other diseases of the circulatory system: Secondary | ICD-10-CM | POA: Diagnosis not present

## 2019-12-09 DIAGNOSIS — E669 Obesity, unspecified: Secondary | ICD-10-CM | POA: Diagnosis not present

## 2019-12-09 DIAGNOSIS — K219 Gastro-esophageal reflux disease without esophagitis: Secondary | ICD-10-CM | POA: Diagnosis not present

## 2019-12-09 DIAGNOSIS — R7303 Prediabetes: Secondary | ICD-10-CM | POA: Diagnosis not present

## 2019-12-26 ENCOUNTER — Ambulatory Visit: Payer: Medicare PPO | Admitting: Podiatry

## 2019-12-26 ENCOUNTER — Encounter: Payer: Self-pay | Admitting: Internal Medicine

## 2019-12-27 ENCOUNTER — Encounter: Payer: Self-pay | Admitting: Internal Medicine

## 2019-12-27 ENCOUNTER — Other Ambulatory Visit: Payer: Self-pay | Admitting: Internal Medicine

## 2020-01-04 ENCOUNTER — Encounter: Payer: Self-pay | Admitting: Internal Medicine

## 2020-01-24 IMAGING — MG DIGITAL DIAGNOSTIC UNILATERAL LEFT MAMMOGRAM WITH TOMO AND CAD
6 of 12 series · 6 of 36 positions shown · non-contrast
Comparison: Previous exam(s).

CLINICAL DATA: Callback from screening mammogram for possible mass
left breast.

EXAM:
DIGITAL DIAGNOSTIC UNILATERAL LEFT MAMMOGRAM WITH CAD AND TOMO

[L ML synth-2D]
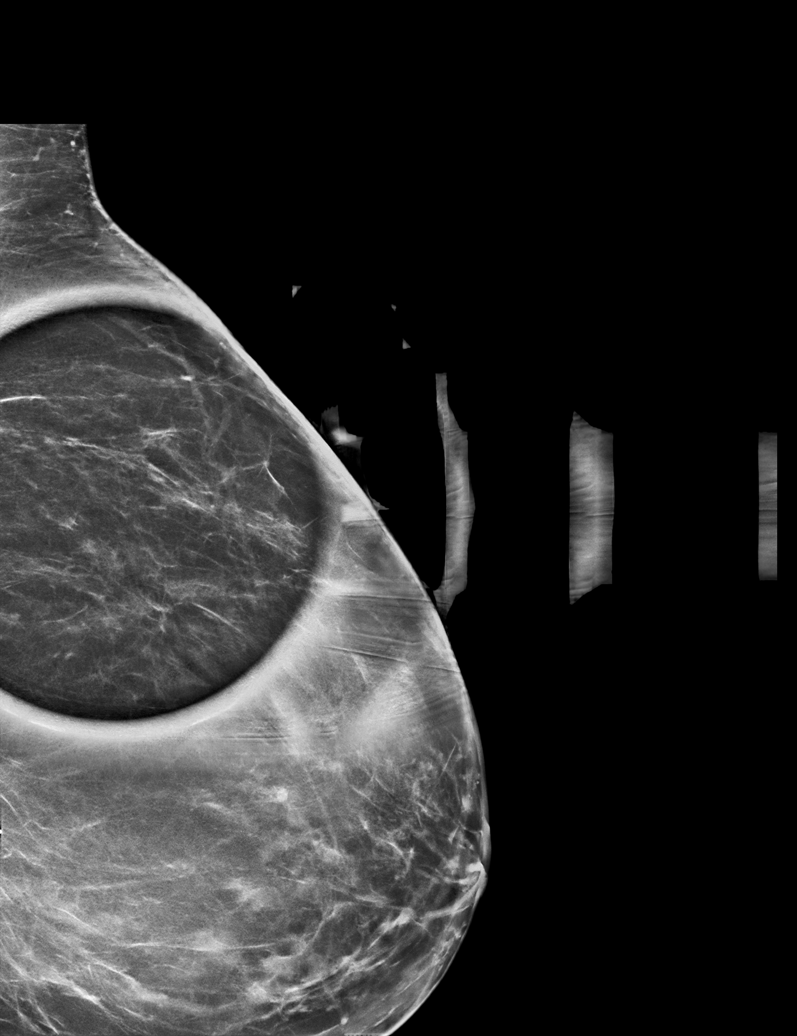

[L CC synth-2D (1 of 3)]
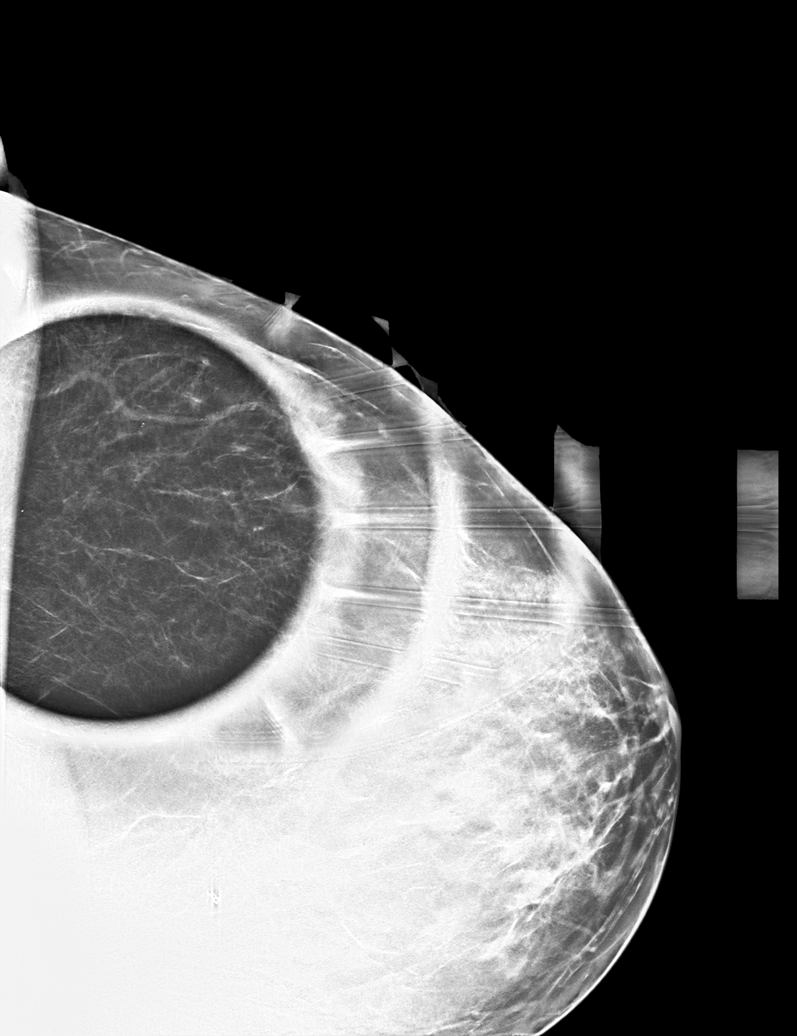

[L CC synth-2D (2 of 3)]
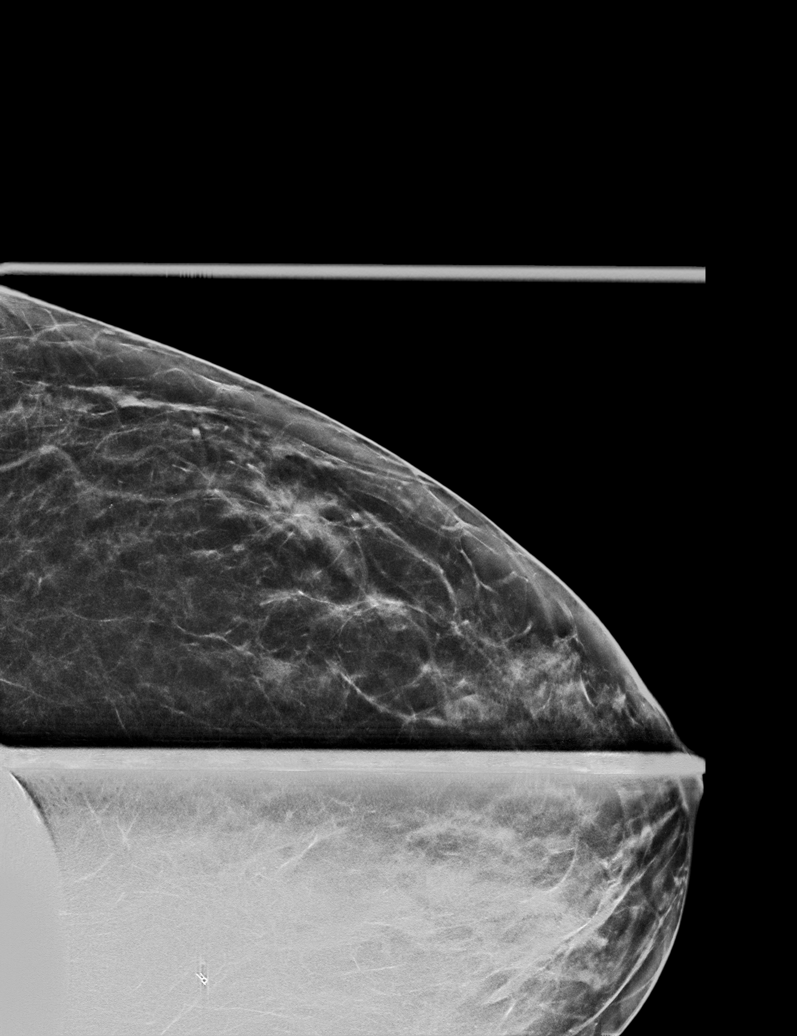

[L CC synth-2D (3 of 3)]
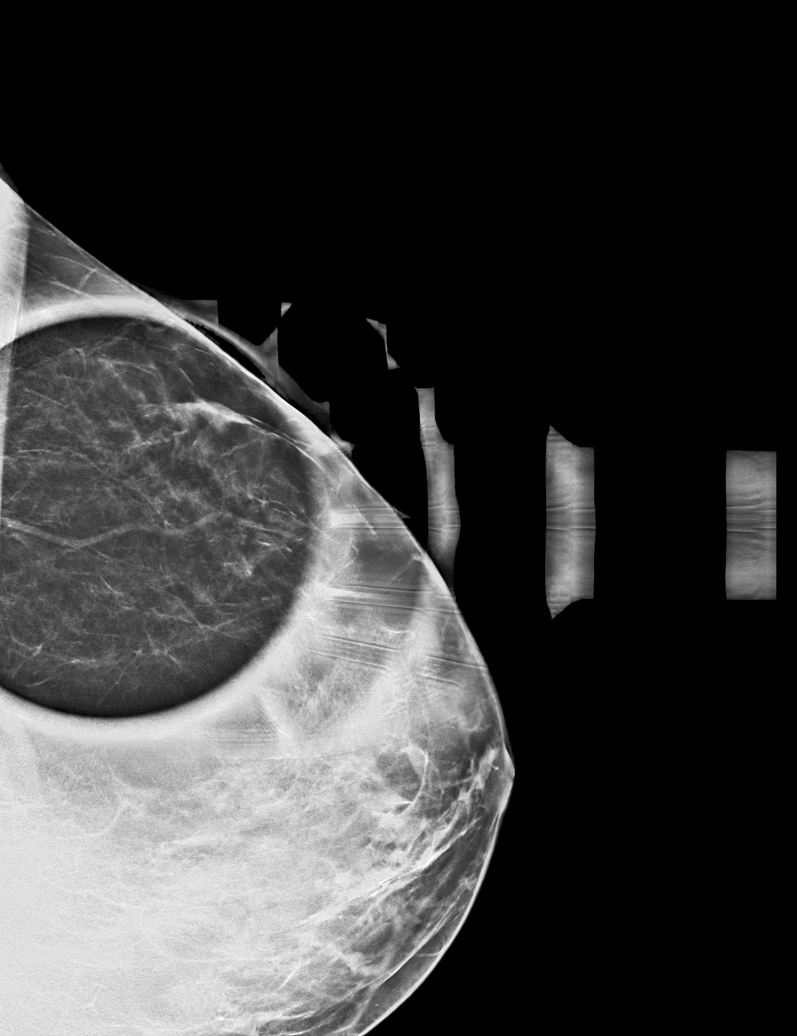

[L MLO synth-2D (1 of 2)]
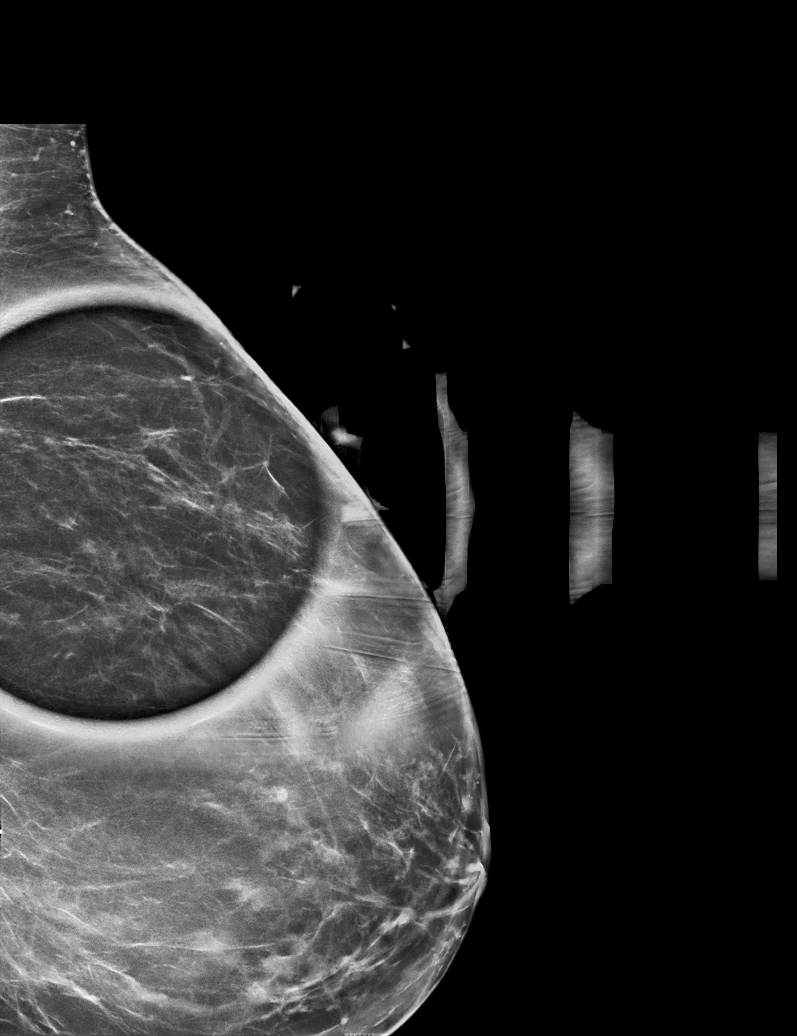

[L MLO synth-2D (2 of 2)]
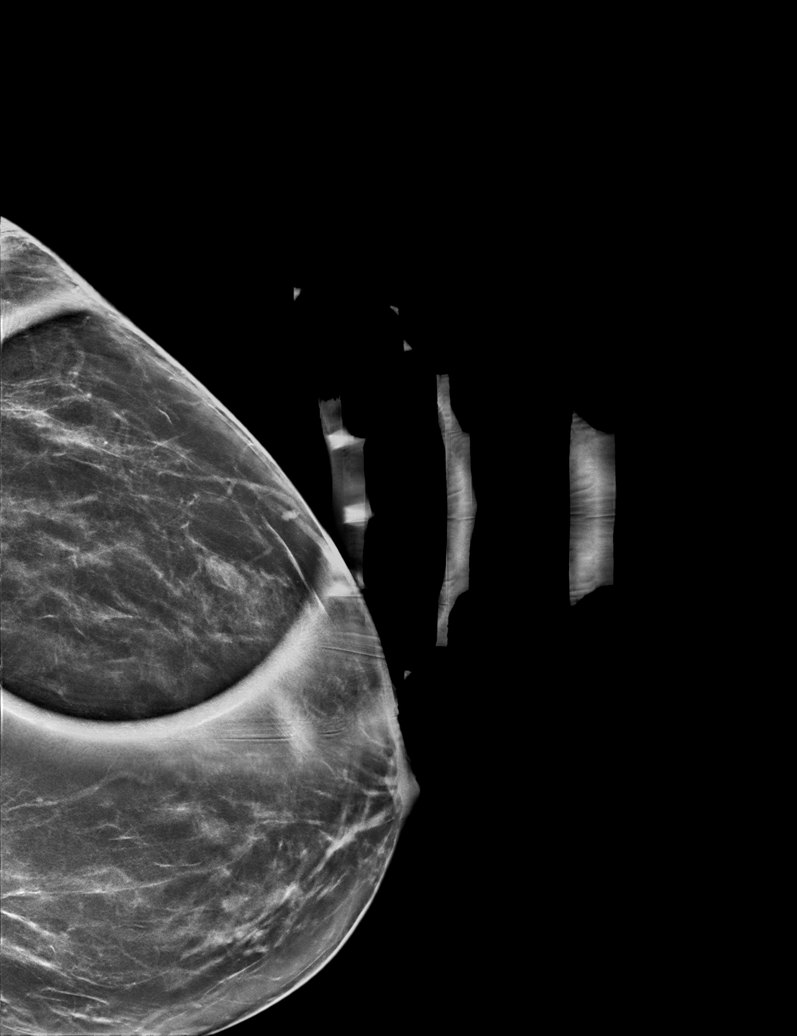

[6 of 36 positions shown; findings below may reference images not displayed]

ACR Breast Density Category b: There are scattered areas of
fibroglandular density.
FINDINGS: Spot compression left CC and MLO views are submitted. The previously
question mass does not persist on the spot compression left MLO
view. The previously questioned asymmetry in the lateral left is
less prominent and appears unchanged compared to multiple prior
mammograms dating back to June 06, 2013.

Mammographic images were processed with CAD.
IMPRESSION: Benign findings.

RECOMMENDATION:
Routine screening mammogram back on schedule.

I have discussed the findings and recommendations with the patient.
Results were also provided in writing at the conclusion of the
visit. If applicable, a reminder letter will be sent to the patient
regarding the next appointment.

BI-RADS CATEGORY  2: Benign.

## 2020-02-09 ENCOUNTER — Ambulatory Visit: Payer: Medicare PPO | Admitting: Physician Assistant

## 2020-02-17 ENCOUNTER — Other Ambulatory Visit: Payer: Self-pay

## 2020-02-17 ENCOUNTER — Ambulatory Visit
Admission: RE | Admit: 2020-02-17 | Discharge: 2020-02-17 | Disposition: A | Discharge status: Home / Self Care | Payer: Medicare PPO | Source: Ambulatory Visit | Attending: Internal Medicine | Admitting: Internal Medicine

## 2020-02-17 DIAGNOSIS — Z1231 Encounter for screening mammogram for malignant neoplasm of breast: Secondary | ICD-10-CM

## 2020-04-02 ENCOUNTER — Other Ambulatory Visit (INDEPENDENT_AMBULATORY_CARE_PROVIDER_SITE_OTHER): Payer: Medicare PPO

## 2020-04-02 ENCOUNTER — Other Ambulatory Visit: Payer: Self-pay

## 2020-04-02 DIAGNOSIS — E785 Hyperlipidemia, unspecified: Secondary | ICD-10-CM

## 2020-04-02 DIAGNOSIS — D72819 Decreased white blood cell count, unspecified: Secondary | ICD-10-CM | POA: Diagnosis not present

## 2020-04-02 DIAGNOSIS — R030 Elevated blood-pressure reading, without diagnosis of hypertension: Secondary | ICD-10-CM | POA: Diagnosis not present

## 2020-04-02 DIAGNOSIS — R7303 Prediabetes: Secondary | ICD-10-CM | POA: Diagnosis not present

## 2020-04-02 DIAGNOSIS — Z1389 Encounter for screening for other disorder: Secondary | ICD-10-CM

## 2020-04-02 LAB — COMPREHENSIVE METABOLIC PANEL
ALT: 13 U/L (ref 0–35)
AST: 19 U/L (ref 0–37)
Albumin: 4.1 g/dL (ref 3.5–5.2)
Alkaline Phosphatase: 75 U/L (ref 39–117)
BUN: 21 mg/dL (ref 6–23)
CO2: 28 mEq/L (ref 19–32)
Calcium: 9.5 mg/dL (ref 8.4–10.5)
Chloride: 104 mEq/L (ref 96–112)
Creatinine, Ser: 0.81 mg/dL (ref 0.40–1.20)
GFR: 72.73 mL/min (ref >60.00)
Glucose, Bld: 89 mg/dL (ref 70–99)
Potassium: 4.2 mEq/L (ref 3.5–5.1)
Sodium: 141 mEq/L (ref 135–145)
Total Bilirubin: 0.7 mg/dL (ref 0.2–1.2)
Total Protein: 6.8 g/dL (ref 6.0–8.3)

## 2020-04-02 LAB — HEMOGLOBIN A1C: Hgb A1c MFr Bld: 6.2 % (ref 4.6–6.5)

## 2020-04-02 LAB — CBC WITH DIFFERENTIAL/PLATELET
Basophils Absolute: 0.1 10*3/uL (ref 0.0–0.1)
Basophils Relative: 1.5 % (ref 0.0–3.0)
Eosinophils Absolute: 0.4 10*3/uL (ref 0.0–0.7)
Eosinophils Relative: 9.9 % — ABNORMAL HIGH (ref 0.0–5.0)
HCT: 43.5 % (ref 36.0–46.0)
Hemoglobin: 14.6 g/dL (ref 12.0–15.0)
Lymphocytes Relative: 29.8 % (ref 12.0–46.0)
Lymphs Abs: 1.3 10*3/uL (ref 0.7–4.0)
MCHC: 33.4 g/dL (ref 30.0–36.0)
MCV: 90.5 fl (ref 78.0–100.0)
Monocytes Absolute: 0.6 10*3/uL (ref 0.1–1.0)
Monocytes Relative: 12.6 % — ABNORMAL HIGH (ref 3.0–12.0)
Neutro Abs: 2.1 10*3/uL (ref 1.4–7.7)
Neutrophils Relative %: 46.2 % (ref 43.0–77.0)
Platelets: 223 10*3/uL (ref 150.0–400.0)
RBC: 4.81 Mil/uL (ref 3.87–5.11)
RDW: 13.9 % (ref 11.5–15.5)
WBC: 4.5 10*3/uL (ref 4.0–10.5)

## 2020-04-02 LAB — LIPID PANEL
Cholesterol: 234 mg/dL — ABNORMAL HIGH (ref 0–200)
HDL: 62 mg/dL (ref >39.00)
LDL Cholesterol: 156 mg/dL — ABNORMAL HIGH (ref 0–99)
NonHDL: 171.92
Total CHOL/HDL Ratio: 4
Triglycerides: 78 mg/dL (ref 0.0–149.0)
VLDL: 15.6 mg/dL (ref 0.0–40.0)

## 2020-04-03 LAB — URINALYSIS, ROUTINE W REFLEX MICROSCOPIC
Bilirubin Urine: NEGATIVE
Glucose, UA: NEGATIVE
Hgb urine dipstick: NEGATIVE
Nitrite: NEGATIVE
Protein, ur: NEGATIVE
Specific Gravity, Urine: 1.017 (ref 1.001–1.03)
pH: <=5 (ref 5.0–8.0)

## 2020-04-04 ENCOUNTER — Other Ambulatory Visit: Payer: Self-pay

## 2020-04-04 ENCOUNTER — Ambulatory Visit: Payer: Medicare PPO | Admitting: Internal Medicine

## 2020-04-04 ENCOUNTER — Encounter: Payer: Self-pay | Admitting: Internal Medicine

## 2020-04-04 VITALS — BP 146/90 | HR 75 | Temp 98.2°F | Ht 62.0 in | Wt 179.2 lb

## 2020-04-04 DIAGNOSIS — E785 Hyperlipidemia, unspecified: Secondary | ICD-10-CM | POA: Diagnosis not present

## 2020-04-04 DIAGNOSIS — I10 Essential (primary) hypertension: Secondary | ICD-10-CM | POA: Diagnosis not present

## 2020-04-04 DIAGNOSIS — Z9889 Other specified postprocedural states: Secondary | ICD-10-CM | POA: Insufficient documentation

## 2020-04-04 DIAGNOSIS — G8929 Other chronic pain: Secondary | ICD-10-CM | POA: Insufficient documentation

## 2020-04-04 DIAGNOSIS — R29890 Loss of height: Secondary | ICD-10-CM | POA: Diagnosis not present

## 2020-04-04 DIAGNOSIS — M549 Dorsalgia, unspecified: Secondary | ICD-10-CM | POA: Diagnosis not present

## 2020-04-04 DIAGNOSIS — M545 Low back pain, unspecified: Secondary | ICD-10-CM | POA: Diagnosis not present

## 2020-04-04 DIAGNOSIS — Z8739 Personal history of other diseases of the musculoskeletal system and connective tissue: Secondary | ICD-10-CM | POA: Insufficient documentation

## 2020-04-04 DIAGNOSIS — R7303 Prediabetes: Secondary | ICD-10-CM

## 2020-04-04 DIAGNOSIS — E669 Obesity, unspecified: Secondary | ICD-10-CM | POA: Diagnosis not present

## 2020-04-04 DIAGNOSIS — R269 Unspecified abnormalities of gait and mobility: Secondary | ICD-10-CM

## 2020-04-04 MED ORDER — LOVASTATIN 10 MG PO TABS: 10.0000 mg | ORAL_TABLET | Freq: Every day | ORAL | 3 refills | Status: DC

## 2020-04-04 NOTE — Progress Notes (Signed)
Chief Complaint  Patient presents with  . Follow-up   F/u  1. BP elevated today after being off lopressor 12.5 mg bid due to intolerance I.e fatigue, sob, heart pounding but not racing helped with palpiations BB.  she will monitor and consider low dose ARB in future  Changed cards to Dr. Clayborn Bigness 2. Moderna booster last week with body aches x 24 hours  3. SVT s/p ablation 09/2019 and doing well  4. HLD/prediabetes agreeable to nutritionist pravachol had side effects  Will try mevacor and if this does not work will have her disc repatha with Dr. Clayborn Bigness   5. S/p back surgery mid and low back in teenage years for scoliosis ht was 5'7" now 5'2" and c/o back pain and off balance agreeable to PT  Review of Systems  Constitutional: Negative for weight loss.  HENT: Negative for hearing loss.   Eyes: Negative for blurred vision.  Respiratory: Negative for shortness of breath.   Cardiovascular: Negative for chest pain and palpitations.  Gastrointestinal: Negative for abdominal pain.  Musculoskeletal: Positive for back pain.  Skin: Negative for rash.  Neurological: Negative for headaches.  Psychiatric/Behavioral: Negative for depression and memory loss.   Past Medical History:  Diagnosis Date  . Arthritis   . Atypical chest pain    a. 04/2015 Myoview: EF 78%, breast attenuation, no ischemia-->Low risk.  . Back pain    scoliosis  . Chicken pox   . Dysrhythmia    hx palpitations  . GERD (gastroesophageal reflux disease)   . History of hiatal hernia    noted on cxr  . Hyperlipidemia   . Joint pain   . Obesity, unspecified   . Paroxysmal SVT (supraventricular tachycardia) ([place])    a. 2013 Holter: PACs/PVCs; b. 10/2011 Ehco: EF nl, no rwma, mild LVH, mild TR, PASP 55mHg; c 04/2015 SVT in ED->resolved with adenosine; c.   . Pneumonia 2010   hx of  . Scoliosis    multiple areas of back follows with Dr. KBoston Servicechiropractor    Past Surgical History:  Procedure Laterality Date  .  ABDOMINAL HYSTERECTOMY     1990 ovaries intact. had 2/2 fibroids last pap 2004 neg.  ? if cervix present   . BACK SURGERY  1960's   d/t scoliosis-1964/65?  .Marland KitchenBACK SURGERY     x 3   . BREAST BIOPSY Left 2008   CORE W/CLIP - NEG  . BREAST BIOPSY     2000   . BREAST EXCISIONAL BIOPSY Right 20 + yrs ago  . BREAST SURGERY  1990   excision breast mass  . COLONOSCOPY  2007   Dr. EVira Agar . COLONOSCOPY WITH ESOPHAGOGASTRODUODENOSCOPY (EGD) AND ESOPHAGEAL DILATION (ED)    . COLONOSCOPY WITH PROPOFOL N/A 03/22/2018   Procedure: COLONOSCOPY WITH PROPOFOL;  Surgeon: EManya Silvas MD;  [facility]  Service: Endoscopy;  Laterality: N/A;  . ESOPHAGOGASTRODUODENOSCOPY (EGD) WITH PROPOFOL N/A 03/22/2018   Procedure: ESOPHAGOGASTRODUODENOSCOPY (EGD) WITH PROPOFOL;  Surgeon: EManya Silvas MD;  [facility];  Service: Endoscopy;  Laterality: N/A;  . EYE SURGERY  2007   cataracts  . FOOT SURGERY  1960's  . JOINT REPLACEMENT     total hip left with metal hardward 2016 Dr. RJudd Lienortho   . OTHER SURGICAL HISTORY  2004   Bilateral cataracts  . PARTIAL HYSTERECTOMY  1990  . REVISION TOTAL HIP ARTHROPLASTY Left 04/17/2014   DR RMayer Camel . TOTAL HIP ARTHROPLASTY  2006  .  TOTAL HIP REVISION Left 04/17/2014   Procedure: LEFT TOTAL HIP REVISION;  Surgeon: Kerin Salen, MD;  Location: [place];  Service: Orthopedics;  Laterality: Left;  . TOTAL KNEE ARTHROPLASTY Right 10/30/2017   Procedure: RIGHT TOTAL KNEE ARTHROPLASTY;  Surgeon: Frederik Pear, MD;  Location: [place];  Service: Orthopedics;  Laterality: Right;   Family History  Problem Relation Age of Onset  . Heart attack Father   . Heart disease Father        MI  . Diabetes Father   . Hypertension Father   . Early death Father   . Hyperlipidemia Father   . Cancer Mother        breast cancer  . Breast cancer Mother 17  . Arthritis Mother   . Dementia Mother   . Varicose Veins Mother   . Aneurysm Brother         cardiac   . Early death Brother   . Colon cancer Cousin    Social History   Socioeconomic History  . Marital status: Married    Spouse name: Not on file  . Number of children: Not on file  . Years of education: Not on file  . Highest education level: Not on file  Occupational History  . Not on file  Tobacco Use  . Smoking status: Never Smoker  . Smokeless tobacco: Never Used  Vaping Use  . Vaping Use: Never used  Substance and Sexual Activity  . Alcohol use: Not Currently    Comment: rare  . Drug use: No  . Sexual activity: Yes    Birth control/protection: Surgical  Other Topics Concern  . 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 Difficulty of Paying Living Expenses: Not on file  Food Insecurity:   . Worried About Charity fundraiser in the Last Year: Not on file  . Ran Out of Food in the Last Year: Not on file  Transportation Needs:   . Lack of Transportation (Medical): Not on file  . Lack of Transportation (Non-Medical): Not on file  Physical Activity:   . Days of Exercise per Week: Not on file  . Minutes of Exercise per Session: Not on file  Stress:   . Feeling of Stress : Not on file  Social Connections:   . Frequency of Communication with Friends and Family: Not on file  . Frequency of Social Gatherings with Friends and Family: Not on file  . Attends Religious Services: Not on file  . Active Member of Clubs or Organizations: Not on file  . Attends Archivist Meetings: Not on file  . Marital Status: Not on file  Intimate Partner Violence:   . Fear of Current or Ex-Partner: Not on file  . Emotionally Abused: Not on file  . Physically Abused: Not on file  . Sexually Abused: Not on file   Current Meds  Medication Sig  . aspirin EC 81 MG tablet Take 81 mg by mouth daily.  .  famotidine (PEPCID) 20 MG tablet Take 20 mg by mouth 2 (two) times daily as needed for heartburn or indigestion.  . meloxicam (MOBIC) 15 MG tablet Take 15 mg by mouth daily as needed.    Allergies  Allergen Reactions  . Metoprolol Tartrate  Sob with exertion, ? Heart pounding, fatigue and heart pounding even with 12.5 mg bid dosing    . Pravastatin     lethargy and fatigue.  After about 4 weeks, I also started feeling "heavy" heart beating and generally not feeling well.  . Diltiazem Rash    CD formulation likely generalized drug rash/eruption face, trunk, arms   . Metoprolol Succinate [Metoprolol] Other (See Comments)    Succinate version causes extreme lethargy   . Tape Itching, Rash and Other (See Comments)    Reaction: blisters (adhesive tape) Paper tape is ok   Recent Results (from the past 2160 hour(s))  Urinalysis, Routine w reflex microscopic     Status: Abnormal   Collection Time: 04/02/20  7:59 AM  Result Value Ref Range   Color, Urine YELLOW YELLOW   APPearance TURBID (A) CLEAR   Specific Gravity, Urine 1.017 1.001 - 1.03   pH < OR = 5.0 5.0 - 8.0   Glucose, UA NEGATIVE NEGATIVE   Bilirubin Urine NEGATIVE NEGATIVE   Ketones, ur 1+ (A) NEGATIVE   Hgb urine dipstick NEGATIVE NEGATIVE   Protein, ur NEGATIVE NEGATIVE   Nitrite NEGATIVE NEGATIVE   Leukocytes,Ua 2+ (A) NEGATIVE   WBC, UA 10-20 (A) 0 - 5 /HPF   RBC / HPF 0-2 0 - 2 /HPF   Squamous Epithelial / LPF 10-20 (A) < OR = 5 /HPF   TRANSITIONAL EPITHELIAL CELLS 0-5 < OR = 5 /HPF   Bacteria, UA FEW (A) NONE SEEN /HPF   Uric Acid Crystal FEW NONE OR FE /HPF   Hyaline Cast 1-3 (A) NONE SEEN /LPF   Granular Cast 0-1 (A) NONE SEEN /LPF  CBC with Differential/Platelet     Status: Abnormal   Collection Time: 04/02/20  7:59 AM  Result Value Ref Range   WBC 4.5 4.0 - 10.5 K/uL   RBC 4.81 3.87 - 5.11 Mil/uL   Hemoglobin 14.6 12.0 - 15.0 g/dL   HCT 43.5 36 - 46 %   MCV 90.5 78.0 - 100.0 fl   MCHC 33.4 30.0 - 36.0  g/dL   RDW 13.9 11.5 - 15.5 %   Platelets 223.0 150 - 400 K/uL   Neutrophils Relative % 46.2 43 - 77 %   Lymphocytes Relative 29.8 12 - 46 %   Monocytes Relative 12.6 (H) 3 - 12 %   Eosinophils Relative 9.9 (H) 0 - 5 %   Basophils Relative 1.5 0 - 3 %   Neutro Abs 2.1 1.4 - 7.7 K/uL   Lymphs Abs 1.3 0.7 - 4.0 K/uL   Monocytes Absolute 0.6 0.1 - 1.0 K/uL   Eosinophils Absolute 0.4 0.0 - 0.7 K/uL   Basophils Absolute 0.1 0.0 - 0.1 K/uL  Hemoglobin A1c     Status: None   Collection Time: 04/02/20  7:59 AM  Result Value Ref Range   Hgb A1c MFr Bld 6.2 4.6 - 6.5 %    Comment: Glycemic Control Guidelines for People with Diabetes:Non Diabetic:  <6%Goal of Therapy: <7%Additional Action Suggested:  >8%   Lipid panel     Status: Abnormal   Collection Time: 04/02/20  7:59 AM  Result Value Ref Range   Cholesterol 234 (H) 0 - 200 mg/dL    Comment: ATP III Classification       Desirable:  < 200 mg/dL               Borderline High:  200 - 239 mg/dL  High:  > = 240 mg/dL   Triglycerides 78.0 0 - 149 mg/dL    Comment: Normal:  <150 mg/dLBorderline High:  150 - 199 mg/dL   HDL 62.00 >39.00 mg/dL   VLDL 15.6 0.0 - 40.0 mg/dL   LDL Cholesterol 156 (H) 0 - 99 mg/dL   Total CHOL/HDL Ratio 4     Comment:                Men          Women1/2 Average Risk     3.4          3.3Average Risk          5.0          4.42X Average Risk          9.6          7.13X Average Risk          15.0          11.0                       NonHDL 171.92     Comment: NOTE:  Non-HDL goal should be 30 mg/dL higher than patient's LDL goal (i.e. LDL goal of < 70 mg/dL, would have non-HDL goal of < 100 mg/dL)  Comprehensive metabolic panel     Status: None   Collection Time: 04/02/20  7:59 AM  Result Value Ref Range   Sodium 141 135 - 145 mEq/L   Potassium 4.2 3.5 - 5.1 mEq/L   Chloride 104 96 - 112 mEq/L   CO2 28 19 - 32 mEq/L   Glucose, Bld 89 70 - 99 mg/dL   BUN 21 6 - 23 mg/dL   Creatinine, Ser 0.81 0.40 - 1.20 mg/dL    Total Bilirubin 0.7 0.2 - 1.2 mg/dL   Alkaline Phosphatase 75 39 - 117 U/L   AST 19 0 - 37 U/L   ALT 13 0 - 35 U/L   Total Protein 6.8 6.0 - 8.3 g/dL   Albumin 4.1 3.5 - 5.2 g/dL   GFR 72.73 >60.00 mL/min    Comment: Calculated using the CKD-EPI Creatinine Equation (2021)   Calcium 9.5 8.4 - 10.5 mg/dL   Objective  Body mass index is 32.78 kg/m. Wt Readings from Last 3 Encounters:  04/04/20 179 lb 3.2 oz (81.3 kg)  09/30/19 183 lb 6.4 oz (83.2 kg)  09/29/19 180 lb (81.6 kg)   Temp Readings from Last 3 Encounters:  04/04/20 98.2 F (36.8 C) (Oral)  09/30/19 (!) 97.3 F (36.3 C) (Temporal)  08/26/19 98.5 F (36.9 C) (Oral)   BP Readings from Last 3 Encounters:  04/04/20 (!) 146/90  09/30/19 132/84  08/26/19 103/69   Pulse Readings from Last 3 Encounters:  04/04/20 75  09/30/19 60  08/26/19 60    Physical Exam Vitals and nursing note reviewed.  Constitutional:      Appearance: Normal appearance. She is well-developed and well-groomed. She is obese.  HENT:     Head: Normocephalic and atraumatic.  Eyes:     Conjunctiva/sclera: Conjunctivae normal.     Pupils: Pupils are equal, round, and reactive to light.  Cardiovascular:     Rate and Rhythm: Normal rate and regular rhythm.     Heart sounds: Normal heart sounds. No murmur heard.   Pulmonary:     Effort: Pulmonary effort is normal.     Breath sounds: Normal breath sounds.  Skin:  General: Skin is warm and dry.  Neurological:     General: No focal deficit present.     Mental Status: She is alert and oriented to person, place, and time. Mental status is at baseline.     Gait: Gait normal.  Psychiatric:        Attention and Perception: Attention and perception normal.        Mood and Affect: Mood and affect normal.        Behavior: Behavior normal. Behavior is cooperative.        Thought Content: Thought content normal.        Cognition and Memory: Cognition and memory normal.        Judgment: Judgment  normal.     Assessment  Plan  Hypertension, unspecified type - Plan: Amb ref to Medical Nutrition Therapy-MNT Consider low dose 12.5 mg losartan in the future   Hyperlipidemia, unspecified hyperlipidemia type - Plan: lovastatin (MEVACOR) 10 MG tablet, Amb ref to Medical Nutrition Therapy-MNT Consider repatha in the future   Prediabetes - Plan: Amb ref to Medical Nutrition Therapy-MNT  Obesity (BMI 30-39.9) - Plan: Amb ref to Medical Nutrition Therapy-MNT Healthy diet and exercise   Abnormal gait - Plan: Ambulatory referral to Physical Therapy Chronic midline low back pain without sciatica - Plan: Ambulatory referral to Physical Therapy Back pain with history of spinal surgery - Plan: Ambulatory referral to Physical Therapy  History of scoliosis - Plan: Ambulatory referral to Physical Therapy  Loss of height - Plan: Ambulatory referral to Physical Therapy   HM Declines flu shot  Given prevnar rx in the past Tdap utd 3/3 moderna Disc shingrixprior appt  Consider hep B vaccine not immune Immune MMR Hep C neg  Colonoscopy 2016 Dr. Juanita Craver need to get records. -EGD/colonoscopy 03/22/2018 Dr. Cordelia Pen hemorrhoids f/u in 10 years and EGD moderate HH, duodenitis omeprazole 40 mg qd x 1 year; no bxs, esophagus dilated  mammorepeat 02/17/20 negative  No need paps/p hysterectomy fibroids no h/o abnormal pap  DEXA 06/07/15 normal  A1C6.0 01/26/19  Never smoker rec healthy diet and exercise  Provider: Dr. Olivia Mackie McLean-Scocuzza-Internal Medicine

## 2020-04-04 NOTE — Patient Instructions (Addendum)
If unable to tolerate try every other day   Consider losartan 12.5 mg daily for blood pressure  Goal <130/<80   Hypertension, Adult High blood pressure (hypertension) is when the force of blood pumping through the arteries is too strong. The arteries are the blood vessels that carry blood from the heart throughout the body. Hypertension forces the heart to work harder to pump blood and may cause arteries to become narrow or stiff. Untreated or uncontrolled hypertension can cause a heart attack, heart failure, a stroke, kidney disease, and other problems. A blood pressure reading consists of a higher number over a lower number. Ideally, your blood pressure should be below 120/80. The first ("top") number is called the systolic pressure. It is a measure of the pressure in your arteries as your heart beats. The second ("bottom") number is called the diastolic pressure. It is a measure of the pressure in your arteries as the heart relaxes. What are the causes? The exact cause of this condition is not known. There are some conditions that result in or are related to high blood pressure. What increases the risk? Some risk factors for high blood pressure are under your control. The following factors may make you more likely to develop this condition:  Smoking.  Having type 2 diabetes mellitus, high cholesterol, or both.  Not getting enough exercise or physical activity.  Being overweight.  Having too much fat, sugar, calories, or salt (sodium) in your diet.  Drinking too much alcohol. Some risk factors for high blood pressure may be difficult or impossible to change. Some of these factors include:  Having chronic kidney disease.  Having a family history of high blood pressure.  Age. Risk increases with age.  Race. You may be at higher risk if you are African American.  Gender. Men are at higher risk than women before age 37. After age 75, women are at higher risk than men.  Having  obstructive sleep apnea.  Stress. What are the signs or symptoms? High blood pressure may not cause symptoms. Very high blood pressure (hypertensive crisis) may cause:  Headache.  Anxiety.  Shortness of breath.  Nosebleed.  Nausea and vomiting.  Vision changes.  Severe chest pain.  Seizures. How is this diagnosed? This condition is diagnosed by measuring your blood pressure while you are seated, with your arm resting on a flat surface, your legs uncrossed, and your feet flat on the floor. The cuff of the blood pressure monitor will be placed directly against the skin of your upper arm at the level of your heart. It should be measured at least twice using the same arm. Certain conditions can cause a difference in blood pressure between your right and left arms. Certain factors can cause blood pressure readings to be lower or higher than normal for a short period of time:  When your blood pressure is higher when you are in a health care provider's office than when you are at home, this is called white coat hypertension. Most people with this condition do not need medicines.  When your blood pressure is higher at home than when you are in a health care provider's office, this is called masked hypertension. Most people with this condition may need medicines to control blood pressure. If you have a high blood pressure reading during one visit or you have normal blood pressure with other risk factors, you may be asked to:  Return on a different day to have your blood pressure checked again.  Monitor your blood pressure at home for 1 week or longer. If you are diagnosed with hypertension, you may have other blood or imaging tests to help your health care provider understand your overall risk for other conditions. How is this treated? This condition is treated by making healthy lifestyle changes, such as eating healthy foods, exercising more, and reducing your alcohol intake. Your health  care provider may prescribe medicine if lifestyle changes are not enough to get your blood pressure under control, and if:  Your systolic blood pressure is above 130.  Your diastolic blood pressure is above 80. Your personal target blood pressure may vary depending on your medical conditions, your age, and other factors. Follow these instructions at home: Eating and drinking   Eat a diet that is high in fiber and potassium, and low in sodium, added sugar, and fat. An example eating plan is called the DASH (Dietary Approaches to Stop Hypertension) diet. To eat this way: ? Eat plenty of fresh fruits and vegetables. Try to fill one half of your plate at each meal with fruits and vegetables. ? Eat whole grains, such as whole-wheat pasta, brown rice, or whole-grain bread. Fill about one fourth of your plate with whole grains. ? Eat or drink low-fat dairy products, such as skim milk or low-fat yogurt. ? Avoid fatty cuts of meat, processed or cured meats, and poultry with skin. Fill about one fourth of your plate with lean proteins, such as fish, chicken without skin, beans, eggs, or tofu. ? Avoid pre-made and processed foods. These tend to be higher in sodium, added sugar, and fat.  Reduce your daily sodium intake. Most people with hypertension should eat less than 1,500 mg of sodium a day.  Do not drink alcohol if: ? Your health care provider tells you not to drink. ? You are pregnant, may be pregnant, or are planning to become pregnant.  If you drink alcohol: ? Limit how much you use to:  0-1 drink a day for women.  0-2 drinks a day for men. ? Be aware of how much alcohol is in your drink. In the [place] one drink equals one 12 oz bottle of beer (355 mL), one 5 oz glass of wine (148 mL), or one 1 oz glass of hard liquor (44 mL). Lifestyle   Work with your health care provider to maintain a healthy body weight or to lose weight. Ask what an ideal weight is for you.  Get at least 30  minutes of exercise most days of the week. Activities may include walking, swimming, or biking.  Include exercise to strengthen your muscles (resistance exercise), such as Pilates or lifting weights, as part of your weekly exercise routine. Try to do these types of exercises for 30 minutes at least 3 days a week.  Do not use any products that contain nicotine or tobacco, such as cigarettes, e-cigarettes, and chewing tobacco. If you need help quitting, ask your health care provider.  Monitor your blood pressure at home as told by your health care provider.  Keep all follow-up visits as told by your health care provider. This is important. Medicines  Take over-the-counter and prescription medicines only as told by your health care provider. Follow directions carefully. Blood pressure medicines must be taken as prescribed.  Do not skip doses of blood pressure medicine. Doing this puts you at risk for problems and can make the medicine less effective.  Ask your health care provider about side effects or reactions to medicines  that you should watch for. Contact a health care provider if you:  Think you are having a reaction to a medicine you are taking.  Have headaches that keep coming back (recurring).  Feel dizzy.  Have swelling in your ankles.  Have trouble with your vision. Get help right away if you:  Develop a severe headache or confusion.  Have unusual weakness or numbness.  Feel faint.  Have severe pain in your chest or abdomen.  Vomit repeatedly.  Have trouble breathing. Summary  Hypertension is when the force of blood pumping through your arteries is too strong. If this condition is not controlled, it may put you at risk for serious complications.  Your personal target blood pressure may vary depending on your medical conditions, your age, and other factors. For most people, a normal blood pressure is less than 120/80.  Hypertension is treated with lifestyle  changes, medicines, or a combination of both. Lifestyle changes include losing weight, eating a healthy, low-sodium diet, exercising more, and limiting alcohol. This information is not intended to replace advice given to you by your health care provider. Make sure you discuss any questions you have with your health care provider. Document Revised: 01/13/2018 Document Reviewed: 01/13/2018 Elsevier Patient Education  2020 [employer]  DASH Eating Plan DASH stands for "Dietary Approaches to Stop Hypertension." The DASH eating plan is a healthy eating plan that has been shown to reduce high blood pressure (hypertension). It may also reduce your risk for type 2 diabetes, heart disease, and stroke. The DASH eating plan may also help with weight loss. What are tips for following this plan?  General guidelines  Avoid eating more than 2,300 mg (milligrams) of salt (sodium) a day. If you have hypertension, you may need to reduce your sodium intake to 1,500 mg a day.  Limit alcohol intake to no more than 1 drink a day for nonpregnant women and 2 drinks a day for men. One drink equals 12 oz of beer, 5 oz of wine, or 1 oz of hard liquor.  Work with your health care provider to maintain a healthy body weight or to lose weight. Ask what an ideal weight is for you.  Get at least 30 minutes of exercise that causes your heart to beat faster (aerobic exercise) most days of the week. Activities may include walking, swimming, or biking.  Work with your health care provider or diet and nutrition specialist (dietitian) to adjust your eating plan to your individual calorie needs. Reading food labels   Check food labels for the amount of sodium per serving. Choose foods with less than 5 percent of the Daily Value of sodium. Generally, foods with less than 300 mg of sodium per serving fit into this eating plan.  To find whole grains, look for the word "whole" as the first word in the ingredient  list. Shopping  Buy products labeled as "low-sodium" or "no salt added."  Buy fresh foods. Avoid canned foods and premade or frozen meals. Cooking  Avoid adding salt when cooking. Use salt-free seasonings or herbs instead of table salt or sea salt. Check with your health care provider or pharmacist before using salt substitutes.  Do not fry foods. Cook foods using healthy methods such as baking, boiling, grilling, and broiling instead.  Cook with heart-healthy oils, such as olive, canola, soybean, or sunflower oil. Meal planning  Eat a balanced diet that includes: ? 5 or more servings of fruits and vegetables each day. At each meal, try  to fill half of your plate with fruits and vegetables. ? Up to 6-8 servings of whole grains each day. ? Less than 6 oz of lean meat, poultry, or fish each day. A 3-oz serving of meat is about the same size as a deck of cards. One egg equals 1 oz. ? 2 servings of low-fat dairy each day. ? A serving of nuts, seeds, or beans 5 times each week. ? Heart-healthy fats. Healthy fats called Omega-3 fatty acids are found in foods such as flaxseeds and coldwater fish, like sardines, salmon, and mackerel.  Limit how much you eat of the following: ? Canned or prepackaged foods. ? Food that is high in trans fat, such as fried foods. ? Food that is high in saturated fat, such as fatty meat. ? Sweets, desserts, sugary drinks, and other foods with added sugar. ? Full-fat dairy products.  Do not salt foods before eating.  Try to eat at least 2 vegetarian meals each week.  Eat more home-cooked food and less restaurant, buffet, and fast food.  When eating at a restaurant, ask that your food be prepared with less salt or no salt, if possible. What foods are recommended? The items listed may not be a complete list. Talk with your dietitian about what dietary choices are best for you. Grains Whole-grain or whole-wheat bread. Whole-grain or whole-wheat pasta. Brown  rice. Orpah Cobbatmeal. Quinoa. Bulgur. Whole-grain and low-sodium cereals. Pita bread. Low-fat, low-sodium crackers. Whole-wheat flour tortillas. Vegetables Fresh or frozen vegetables (raw, steamed, roasted, or grilled). Low-sodium or reduced-sodium tomato and vegetable juice. Low-sodium or reduced-sodium tomato sauce and tomato paste. Low-sodium or reduced-sodium canned vegetables. Fruits All fresh, dried, or frozen fruit. Canned fruit in natural juice (without added sugar). Meat and other protein foods Skinless chicken or [place]urkey. Ground chicken or [place]key. Pork with fat trimmed off. Fish and seafood. Egg whites. Dried beans, peas, or lentils. Unsalted nuts, nut butters, and seeds. Unsalted canned beans. Lean cuts of beef with fat trimmed off. Low-sodium, lean deli meat. Dairy Low-fat (1%) or fat-free (skim) milk. Fat-free, low-fat, or reduced-fat cheeses. Nonfat, low-sodium ricotta or cottage cheese. Low-fat or nonfat yogurt. Low-fat, low-sodium cheese. Fats and oils Soft margarine without trans fats. Vegetable oil. Low-fat, reduced-fat, or light mayonnaise and salad dressings (reduced-sodium). Canola, safflower, olive, soybean, and sunflower oils. Avocado. Seasoning and other foods Herbs. Spices. Seasoning mixes without salt. Unsalted popcorn and pretzels. Fat-free sweets. What foods are not recommended? The items listed may not be a complete list. Talk with your dietitian about what dietary choices are best for you. Grains Baked goods made with fat, such as croissants, muffins, or some breads. Dry pasta or rice meal packs. Vegetables Creamed or fried vegetables. Vegetables in a cheese sauce. Regular canned vegetables (not low-sodium or reduced-sodium). Regular canned tomato sauce and paste (not low-sodium or reduced-sodium). Regular tomato and vegetable juice (not low-sodium or reduced-sodium). Rosita FirePickles. Olives. Fruits Canned fruit in a light or heavy syrup. Fried fruit. Fruit in cream or butter  sauce. Meat and other protein foods Fatty cuts of meat. Ribs. Fried meat. Tomasa BlaseBacon. Sausage. Bologna and other processed lunch meats. Salami. Fatback. Hotdogs. Bratwurst. Salted nuts and seeds. Canned beans with added salt. Canned or smoked fish. Whole eggs or egg yolks. Chicken or [place]key with skin. Dairy Whole or 2% milk, cream, and half-and-half. Whole or full-fat cream cheese. Whole-fat or sweetened yogurt. Full-fat cheese. Nondairy creamers. Whipped toppings. Processed cheese and cheese spreads. Fats and oils Butter. Stick margarine. Lard. Shortening. Ghee. Tomasa BlaseBacon  fat. Tropical oils, such as coconut, palm kernel, or palm oil. Seasoning and other foods Salted popcorn and pretzels. Onion salt, garlic salt, seasoned salt, table salt, and sea salt. Worcestershire sauce. Tartar sauce. Barbecue sauce. Teriyaki sauce. Soy sauce, including reduced-sodium. Steak sauce. Canned and packaged gravies. Fish sauce. Oyster sauce. Cocktail sauce. Horseradish that you find on the shelf. Ketchup. Mustard. Meat flavorings and tenderizers. Bouillon cubes. Hot sauce and Tabasco sauce. Premade or packaged marinades. Premade or packaged taco seasonings. Relishes. Regular salad dressings. Where to find more information:  National Heart, Lung, and Blood Institute: PopSteam.is  American Heart Association: www.heart.org Summary  The DASH eating plan is a healthy eating plan that has been shown to reduce high blood pressure (hypertension). It may also reduce your risk for type 2 diabetes, heart disease, and stroke.  With the DASH eating plan, you should limit salt (sodium) intake to 2,300 mg a day. If you have hypertension, you may need to reduce your sodium intake to 1,500 mg a day.  When on the DASH eating plan, aim to eat more fresh fruits and vegetables, whole grains, lean proteins, low-fat dairy, and heart-healthy fats.  Work with your health care provider or diet and nutrition specialist (dietitian) to adjust  your eating plan to your individual calorie needs. This information is not intended to replace advice given to you by your health care provider. Make sure you discuss any questions you have with your health care provider. Document Revised: 04/17/2017 Document Reviewed: 04/28/2016 Elsevier Patient Education  2020 Elsevier Inc.  Evolocumab injection What is this medicine? EVOLOCUMAB (e voe LOK ue mab) is known as a PCSK9 inhibitor. It is used to lower the level of cholesterol in the blood. It may be used alone or in combination with other cholesterol-lowering drugs. This drug may also be used to reduce the risk of heart attack, stroke, and certain types of heart surgery in patients with heart disease. This medicine may be used for other purposes; ask your health care provider or pharmacist if you have questions. COMMON BRAND NAME(S): Repatha What should I tell my health care provider before I take this medicine? They need to know if you have any of these conditions:  an unusual or allergic reaction to evolocumab, other medicines, latex, foods, dyes, or preservatives  pregnant or trying to get pregnant  breast-feeding How should I use this medicine? This medicine is for injection under the skin. You will be taught how to prepare and give this medicine. Use exactly as directed. Take your medicine at regular intervals. Do not take your medicine more often than directed. It is important that you put your used needles and syringes in a special sharps container. Do not put them in a trash can. If you do not have a sharps container, call your pharmacist or health care provider to get one. Talk to your pediatrician regarding the use of this medicine in children. While this drug may be prescribed for children as young as 13 years for selected conditions, precautions do apply. Overdosage: If you think you have taken too much of this medicine contact a poison control center or emergency room at  once. NOTE: This medicine is only for you. Do not share this medicine with others. What if I miss a dose? If you miss a dose, take it as soon as you can if there are more than 7 days until the next scheduled dose, or skip the missed dose and take the next dose according to your  original schedule. Do not take double or extra doses. What may interact with this medicine? Interactions are not expected. This list may not describe all possible interactions. Give your health care provider a list of all the medicines, herbs, non-prescription drugs, or dietary supplements you use. Also tell them if you smoke, drink alcohol, or use illegal drugs. Some items may interact with your medicine. What should I watch for while using this medicine? Visit your health care provider for regular checks on your progress. Tell your health care provider if your symptoms do not start to get better or if they get worse. You may need blood work done while you are taking this drug. Do not wear the on-body infuser during an MRI. What side effects may I notice from receiving this medicine? Side effects that you should report to your doctor or health care professional as soon as possible:  allergic reactions like skin rash, itching or hives, swelling of the face, lips, or tongue  signs and symptoms of high blood sugar such as dizziness; dry mouth; dry skin; fruity breath; nausea; stomach pain; increased hunger or thirst; increased urination  signs and symptoms of infection like fever or chills; cough; sore throat; pain or trouble passing urine Side effects that usually do not require medical attention (report to your doctor or health care professional if they continue or are bothersome):  diarrhea  nausea  muscle pain  pain, redness, or irritation at site where injected This list may not describe all possible side effects. Call your doctor for medical advice about side effects. You may report side effects to FDA at  1-800-FDA-1088. Where should I keep my medicine? Keep out of the reach of children. You will be instructed on how to store this medicine. Throw away any unused medicine after the expiration date on the label. NOTE: This sheet is a summary. It may not cover all possible information. If you have questions about this medicine, talk to your doctor, pharmacist, or health care provider.  2020 Elsevier/Gold Standard (2019-03-08 16:22:29)  Lovastatin tablets What is this medicine? LOVASTATIN (LOE va sta tin) is known as a HMG-CoA reductase inhibitor or 'statin'. It lowers the level of cholesterol in the blood. This drug may also reduce the risk of heart attack or other health problems in patients with risk factors for heart disease. Diet and lifestyle changes are often used with this drug. This medicine may be used for other purposes; ask your health care provider or pharmacist if you have questions. COMMON BRAND NAME(S): Mevacor What should I tell my health care provider before I take this medicine? They need to know if you have any of these conditions:  diabetes  if you often drink alcohol  history of stroke  kidney disease  liver disease  muscle aches or weakness  thyroid disease  an unusual or allergic reaction to lovastatin, other medicines, foods, dyes, or preservatives  pregnant or trying to get pregnant  breast-feeding How should I use this medicine? Take this medicine by mouth with a glass of water. Follow the directions on the prescription label. Take with food. Take your doses at regular intervals. Do not take your medicine more often than directed. Talk to your pediatrician regarding the use of this medicine in children. While this drug may be prescribed for children as young as 78 years of age for selected conditions, precautions do apply. Overdosage: If you think you have taken too much of this medicine contact a poison control center or emergency  room at once. NOTE: This  medicine is only for you. Do not share this medicine with others. What if I miss a dose? If you miss a dose, take it as soon as you can. If it is almost time for your next dose, take only that dose. Do not take double or extra doses. What may interact with this medicine? Do not take this medicine with any of the following medications:  boceprevir  certain antibiotics like erythromycin, clarithromycin, and telithromycin  certain antiviral medicines for HIV or AIDS  gemfibrozil  herbal medicines like red yeast rice  medicines for fungal infections like itraconazole, ketoconazole, and posaconazole  mifepristone, RU-486  nefazodone  telaprevir This medicine may also interact with the following medications:  alcohol  amiodarone  colchicine  cyclosporine  danazol  diltiazem  fluconazole  grapefruit juice  other medicines for high cholesterol  ranolazine  verapamil  voriconazole  warfarin This list may not describe all possible interactions. Give your health care provider a list of all the medicines, herbs, non-prescription drugs, or dietary supplements you use. Also tell them if you smoke, drink alcohol, or use illegal drugs. Some items may interact with your medicine. What should I watch for while using this medicine? Visit your doctor or health care professional for regular check-ups. You may need regular tests to make sure your liver is working properly. Your health care professional may tell you to stop taking this medicine if you develop muscle problems. If your muscle problems do not go away after stopping this medicine, contact your health care professional. Do not become pregnant while taking this medicine. Women should inform their health care professional if they wish to become pregnant or think they might be pregnant. There is a potential for serious side effects to an unborn child. Talk to your health care professional or pharmacist for more information. Do  not breast-feed an infant while taking this medicine. This medicine may increase blood sugar. Ask your healthcare provider if changes in diet or medicines are needed if you have diabetes. If you are going to need surgery or other procedure, tell your doctor that you are using this medicine. This drug is only part of a total heart-health program. Your doctor or a dietician can suggest a low-cholesterol and low-fat diet to help. Avoid alcohol and smoking, and keep a proper exercise schedule. This medicine may cause a decrease in Co-Enzyme Q-10. You should make sure that you get enough Co-Enzyme Q-10 while you are taking this medicine. Discuss the foods you eat and the vitamins you take with your health care professional. What side effects may I notice from receiving this medicine? Side effects that you should report to your doctor or health care professional as soon as possible:  allergic reactions like skin rash, itching or hives, swelling of the face, lips, or tongue  confusion  diabetes mellitus  joint pain  loss of memory  redness, blistering, peeling or loosening of the skin, including inside the mouth  signs and symptoms of high blood sugar such as being more thirsty or hungry or having to urinate more than normal. You may also feel very tired or have blurry vision.  signs and symptoms of muscle injury like dark urine; trouble passing urine or change in the amount of urine; unusually weak or tired; muscle pain or side or back pain  yellowing of the eyes or skin Side effects that usually do not require medical attention (report to your doctor or health care professional if  they continue or are bothersome):  constipation  diarrhea  dizziness  gas  headache  nausea  stomach pain  trouble sleeping  upset stomach This list may not describe all possible side effects. Call your doctor for medical advice about side effects. You may report side effects to FDA at  1-800-FDA-1088. Where should I keep my medicine? Keep out of the reach of children. Store at room temperature between 5 and 30 degrees C (41 and 86 degrees F). Protect from light. Keep container tightly closed. Throw away any unused medicine after the expiration date. NOTE: This sheet is a summary. It may not cover all possible information. If you have questions about this medicine, talk to your doctor, pharmacist, or health care provider.  2020 Elsevier/Gold Standard (2018-02-25 08:01:42)  Ezetimibe Tablets What is this medicine? EZETIMIBE (ez ET i mibe) blocks the absorption of cholesterol from the stomach. It can help lower blood cholesterol for patients who are at risk of getting heart disease or a stroke. It is only for patients whose cholesterol level is not controlled by diet. This medicine may be used for other purposes; ask your health care provider or pharmacist if you have questions. COMMON BRAND NAME(S): Zetia What should I tell my health care provider before I take this medicine? They need to know if you have any of these conditions:  liver disease  an unusual or allergic reaction to ezetimibe, medicines, foods, dyes, or preservatives  pregnant or trying to get pregnant  breast-feeding How should I use this medicine? Take this medicine by mouth with a glass of water. Follow the directions on the prescription label. This medicine can be taken with or without food. Take your doses at regular intervals. Do not take your medicine more often than directed. Talk to your pediatrician regarding the use of this medicine in children. Special care may be needed. Overdosage: If you think you have taken too much of this medicine contact a poison control center or emergency room at once. NOTE: This medicine is only for you. Do not share this medicine with others. What if I miss a dose? If you miss a dose, take it as soon as you can. If it is almost time for your next dose, take only that  dose. Do not take double or extra doses. What may interact with this medicine? Do not take this medicine with any of the following medications:  fenofibrate  gemfibrozil This medicine may also interact with the following medications:  antacids  cyclosporine  herbal medicines like red yeast rice  other medicines to lower cholesterol or triglycerides This list may not describe all possible interactions. Give your health care provider a list of all the medicines, herbs, non-prescription drugs, or dietary supplements you use. Also tell them if you smoke, drink alcohol, or use illegal drugs. Some items may interact with your medicine. What should I watch for while using this medicine? Visit your doctor or health care professional for regular checks on your progress. You will need to have your cholesterol levels checked. If you are also taking some other cholesterol medicines, you will also need to have tests to make sure your liver is working properly. Tell your doctor or health care professional if you get any unexplained muscle pain, tenderness, or weakness, especially if you also have a fever and tiredness. You need to follow a low-cholesterol, low-fat diet while you are taking this medicine. This will decrease your risk of getting heart and blood vessel disease. Exercising and  avoiding alcohol and smoking can also help. Ask your doctor or dietician for advice. What side effects may I notice from receiving this medicine? Side effects that you should report to your doctor or health care professional as soon as possible:  allergic reactions like skin rash, itching or hives, swelling of the face, lips, or tongue  dark yellow or brown urine  unusually weak or tired  yellowing of the skin or eyes Side effects that usually do not require medical attention (report to your doctor or health care professional if they continue or are bothersome):  diarrhea  dizziness  headache  stomach  upset or pain This list may not describe all possible side effects. Call your doctor for medical advice about side effects. You may report side effects to FDA at 1-800-FDA-1088. Where should I keep my medicine? Keep out of the reach of children. Store at room temperature between 15 and 30 degrees C (59 and 86 degrees F). Protect from moisture. Keep container tightly closed. Throw away any unused medicine after the expiration date. NOTE: This sheet is a summary. It may not cover all possible information. If you have questions about this medicine, talk to your doctor, pharmacist, or health care provider.  2020 Elsevier/Gold Standard (2011-11-10 15:39:09)

## 2020-04-05 DIAGNOSIS — R29890 Loss of height: Secondary | ICD-10-CM | POA: Insufficient documentation

## 2020-04-05 DIAGNOSIS — E669 Obesity, unspecified: Secondary | ICD-10-CM | POA: Insufficient documentation

## 2020-05-02 ENCOUNTER — Other Ambulatory Visit: Payer: Self-pay

## 2020-05-02 ENCOUNTER — Encounter: Payer: Self-pay | Admitting: Dietician

## 2020-05-02 ENCOUNTER — Encounter: Payer: Medicare PPO | Attending: Internal Medicine | Admitting: Dietician

## 2020-05-02 VITALS — Ht 62.0 in | Wt 177.6 lb

## 2020-05-02 DIAGNOSIS — R7303 Prediabetes: Secondary | ICD-10-CM | POA: Insufficient documentation

## 2020-05-02 DIAGNOSIS — I10 Essential (primary) hypertension: Secondary | ICD-10-CM | POA: Diagnosis not present

## 2020-05-02 DIAGNOSIS — E785 Hyperlipidemia, unspecified: Secondary | ICD-10-CM | POA: Insufficient documentation

## 2020-05-02 DIAGNOSIS — Z6832 Body mass index (BMI) 32.0-32.9, adult: Secondary | ICD-10-CM | POA: Diagnosis not present

## 2020-05-02 DIAGNOSIS — Z713 Dietary counseling and surveillance: Secondary | ICD-10-CM | POA: Insufficient documentation

## 2020-05-02 DIAGNOSIS — E669 Obesity, unspecified: Secondary | ICD-10-CM

## 2020-05-02 NOTE — Patient Instructions (Signed)
   Great job making healthy food choices and controlling portions of carb foods!   Increase vegetables a little more by having them for snacks with a small amount of dip. Hummus is a very healthy dip.   Make sure to continue to eat something every 3-5 hours to keep blood sugar steady and avoid excessive hunger or low blood sugar. Include a source of protein with each meal.   Start some light exercise on stationary bike; start with a few minutes and gradually increase as strength builds.

## 2020-05-02 NOTE — Progress Notes (Signed)
Medical Nutrition Therapy: Visit start time: 1600  end time: 1700  Assessment:  Diagnosis: HTN, HLD, pre-diabetes, obesity Past medical history: same as above Psychosocial issues/ stress concerns: none  Preferred learning method:  . Visual   Current weight: 177.6lbs Height: 5'2" Medications, supplements: reconciled list in medical record  Progress and evaluation:   Patient reports weight has been 170-178lbs for several years; she would like to lose some weight; has lost about 5lbs since initial quarantine time last year.   She reports HbA1C fluctuating between 5.7 and 6.2% for the past several years.   She has worked on dietary changes to help improve BG control as well as cholesterol and blood pressure, but has not been able to get into goal ranges for BG and cholesterol.    Physical activity: gardening occasionally; likes to walk but uncomfortable due to back/ posture. Plans to try stationary bike at home.   Dietary Intake:  Usual eating pattern includes 3 meals and 1-2 snacks per day. Dining out frequency: 5-6 meals per week.  Breakfast: 11am usually Fage nonfat yoburt and fruit blue raspberries, strawerries; oatmeal with chia seeds and apple cinnamon; apple and almond butter; scrambled eggs Snack: none Lunch: 4pm grilled chicken sandwich or salad with chicken at home Snack: snacks on nuts walnuts/ almonds and/or fruit; was eating rice crackers (likes chips and crackers) Supper: 9pm small baked salmon and vegetables; avoids starches at night Snack:  Beverages: water, herbal tea, sparkling mineral water, occ fruit juice diluted with water  Nutrition Care Education: Topics covered:  Basic nutrition: basic food groups, appropriate nutrient balance, appropriate meal and snack schedule, general nutrition guidelines    Weight control: making low sugar and low fat choices, portion control, role of exercise Diabetes prevention:  goals for HbA1C, appropriate meal and snack schedule,  appropriate carb intake and balance, healthy carb choices, role of fiber, protein, fat Hypertension:  identifying high sodium foods, low sodium seasoning options; entifying food sources of potassium, magnesium Hyperlipidemia:  target goals for lipids, healthy and unhealthy fats, role of fiber, plant sterols, role of exercise  Nutritional Diagnosis:  [place]-2.2 Altered nutrition-related laboratory As related to pre-diabetes, hyperlipidemia, hypertension.  As evidenced by elevated HbA1C, total cholesterol, and LDL. [place]-3.3 Overweight/obesity As related to history of excess calories and inadequate physical activity.  As evidenced by patient with current BMI of 32.48, currently following eating pattern to promote weight loss.  Intervention:  . Instruction and discussion as noted above. . Patient is making healthy and appropriate food choices, is eating at regular intervals, and is limiting sugar, unhealthy fat, and starchy foods.  . She will plan to add some light activity and further increase healthy foods such as low-carb vegetables.  . No follow-up needed at this time; patient to schedule later if needed.  Education Materials given:  . General diet guidelines for Diabetes (prevention) . Plate Planner with food lists . Mediterranean Style Eating  . Foods to Choose to Lower Cholesterol . Visit summary with goals/ instructions   Learner/ who was taught:  . Patient   Level of understanding: Marland Kitchen Verbalizes/ demonstrates competency   Demonstrated degree of understanding via:   Teach back Learning barriers: . None  Willingness to learn/ readiness for change: . Eager, change in progress   Monitoring and Evaluation:  Dietary intake, exercise, BG control, BP control, blood lipids, and body weight      follow up: prn

## 2020-05-03 DIAGNOSIS — M5451 Vertebrogenic low back pain: Secondary | ICD-10-CM | POA: Diagnosis not present

## 2020-05-03 DIAGNOSIS — R293 Abnormal posture: Secondary | ICD-10-CM | POA: Diagnosis not present

## 2020-05-08 DIAGNOSIS — M5451 Vertebrogenic low back pain: Secondary | ICD-10-CM | POA: Diagnosis not present

## 2020-05-08 DIAGNOSIS — R293 Abnormal posture: Secondary | ICD-10-CM | POA: Diagnosis not present

## 2020-05-09 ENCOUNTER — Telehealth: Payer: Self-pay

## 2020-05-09 NOTE — Telephone Encounter (Signed)
Faxed signed plan of care to [facility] physical therapy @ [phone] on 05/09/20

## 2020-05-14 ENCOUNTER — Telehealth: Payer: Self-pay | Admitting: Internal Medicine

## 2020-05-14 NOTE — Addendum Note (Signed)
Addended by: Quentin Ore on: 05/14/2020 04:37 PM   Modules accepted: Orders

## 2020-05-14 NOTE — Telephone Encounter (Signed)
Patient informed and verbalized understanding

## 2020-05-14 NOTE — Telephone Encounter (Signed)
She needs ortho spine to [facility] if [facility] is what she needs will place [facility]

## 2020-05-14 NOTE — Telephone Encounter (Signed)
Patient is agreeable to a referral to [facility] in [place]

## 2020-05-14 NOTE — Telephone Encounter (Signed)
I called [facility] French Ana stated we can try [facility] or speak with [facility]. Would you want me to do that?        Previous Messages   ----- Message -----  From: McLean-Scocuzza, Pasty Spillers, MD  Sent: 04/18/2020  3:57 PM EST  To: Lelon Frohlich, CMA  Subject: RE: referral                   I dont want them to do this but do they have one they rec I would order this ?  Can we call ?  ----- Message -----  From: Karlyne Greenspan  Sent: 04/18/2020  3:55 PM EST  To: Pasty Spillers McLean-Scocuzza, MD  Subject: referral                     Good afternoon!   [facility] called regarding pt referral stating they do not do bracing. Please advise and Thank you!

## 2020-05-21 ENCOUNTER — Encounter: Payer: Self-pay | Admitting: Internal Medicine

## 2020-05-21 NOTE — Telephone Encounter (Signed)
Ok, sent on 05/14/2020.

## 2020-05-22 DIAGNOSIS — M4180 Other forms of scoliosis, site unspecified: Secondary | ICD-10-CM | POA: Diagnosis not present

## 2020-05-24 DIAGNOSIS — R293 Abnormal posture: Secondary | ICD-10-CM | POA: Diagnosis not present

## 2020-05-24 DIAGNOSIS — M5451 Vertebrogenic low back pain: Secondary | ICD-10-CM | POA: Diagnosis not present

## 2020-05-28 DIAGNOSIS — M5451 Vertebrogenic low back pain: Secondary | ICD-10-CM | POA: Diagnosis not present

## 2020-05-28 DIAGNOSIS — R293 Abnormal posture: Secondary | ICD-10-CM | POA: Diagnosis not present

## 2020-05-29 DIAGNOSIS — M818 Other osteoporosis without current pathological fracture: Secondary | ICD-10-CM | POA: Diagnosis not present

## 2020-06-07 DIAGNOSIS — Z20822 Contact with and (suspected) exposure to covid-19: Secondary | ICD-10-CM | POA: Diagnosis not present

## 2020-06-07 DIAGNOSIS — I10 Essential (primary) hypertension: Secondary | ICD-10-CM | POA: Diagnosis not present

## 2020-06-13 DIAGNOSIS — M818 Other osteoporosis without current pathological fracture: Secondary | ICD-10-CM | POA: Diagnosis not present

## 2020-06-20 DIAGNOSIS — R293 Abnormal posture: Secondary | ICD-10-CM | POA: Diagnosis not present

## 2020-06-20 DIAGNOSIS — M5451 Vertebrogenic low back pain: Secondary | ICD-10-CM | POA: Diagnosis not present

## 2020-06-21 NOTE — Telephone Encounter (Signed)
Faxed sign plan of care and review notes back to [facility] on 06/21/20

## 2020-06-25 ENCOUNTER — Encounter: Payer: Self-pay | Admitting: Internal Medicine

## 2020-06-26 ENCOUNTER — Other Ambulatory Visit: Payer: Self-pay | Admitting: Internal Medicine

## 2020-06-26 DIAGNOSIS — M5451 Vertebrogenic low back pain: Secondary | ICD-10-CM | POA: Diagnosis not present

## 2020-06-26 DIAGNOSIS — R293 Abnormal posture: Secondary | ICD-10-CM | POA: Diagnosis not present

## 2020-06-26 DIAGNOSIS — E785 Hyperlipidemia, unspecified: Secondary | ICD-10-CM

## 2020-06-26 MED ORDER — EZETIMIBE 10 MG PO TABS: 10.0000 mg | ORAL_TABLET | Freq: Every day | ORAL | 3 refills | Status: DC

## 2020-06-26 NOTE — Progress Notes (Signed)
No chief complaint on file.  HPI ROS Past Medical History:  Diagnosis Date  . Arthritis   . Atypical chest pain    a. 04/2015 Myoview: EF 78%, breast attenuation, no ischemia-->Low risk.  . Back pain    scoliosis  . Chicken pox   . Dysrhythmia    hx palpitations  . GERD (gastroesophageal reflux disease)   . History of hiatal hernia    noted on cxr  . Hyperlipidemia   . Joint pain   . Obesity, unspecified   . Paroxysmal SVT (supraventricular tachycardia) (HCC)    a. 2013 Holter: PACs/PVCs; b. 10/2011 Ehco: EF nl, no rwma, mild LVH, mild TR, PASP ; c 04/2015 SVT in ED->resolved with adenosine; c.   . Pneumonia 2010   hx of  . Scoliosis    multiple areas of back follows with Dr. Dyke Maes chiropractor    Past Surgical History:  Procedure Laterality Date  . ABDOMINAL HYSTERECTOMY     1990 ovaries intact. had 2/2 fibroids last pap 2004 neg.  ? if cervix present   . BACK SURGERY  1960's   d/t scoliosis-1964/65?  Marland Kitchen BACK SURGERY     x 3   . BREAST BIOPSY Left 2008   CORE W/CLIP - NEG  . BREAST BIOPSY     2000   . BREAST EXCISIONAL BIOPSY Right 20 + yrs ago  . BREAST SURGERY  1990   excision breast mass  . COLONOSCOPY  2007   Dr. Mechele Collin  . COLONOSCOPY WITH ESOPHAGOGASTRODUODENOSCOPY (EGD) AND ESOPHAGEAL DILATION (ED)    . COLONOSCOPY WITH PROPOFOL N/A 03/22/2018   Procedure: COLONOSCOPY WITH PROPOFOL;  Surgeon: Scot Jun, MD;  Location: [facility] ENDOSCOPY;  Service: Endoscopy;  Laterality: N/A;  . ESOPHAGOGASTRODUODENOSCOPY (EGD) WITH PROPOFOL N/A 03/22/2018   Procedure: ESOPHAGOGASTRODUODENOSCOPY (EGD) WITH PROPOFOL;  Surgeon: Scot Jun, MD;  Location: [facility] ENDOSCOPY;  Service: Endoscopy;  Laterality: N/A;  . EYE SURGERY  2007   cataracts  . FOOT SURGERY  1960's  . JOINT REPLACEMENT     total hip left with metal hardward 2016 Dr. Alphonsa Gin ortho   . OTHER SURGICAL HISTORY  2004   Bilateral cataracts  . PARTIAL HYSTERECTOMY  1990  . REVISION TOTAL  HIP ARTHROPLASTY Left 04/17/2014   DR Turner Daniels  . TOTAL HIP ARTHROPLASTY  2006  . TOTAL HIP REVISION Left 04/17/2014   Procedure: LEFT TOTAL HIP REVISION;  Surgeon: Nestor Lewandowsky, MD;  Location: [facility] OR;  Service: Orthopedics;  Laterality: Left;  . TOTAL KNEE ARTHROPLASTY Right 10/30/2017   Procedure: RIGHT TOTAL KNEE ARTHROPLASTY;  Surgeon: Gean Birchwood, MD;  Location: [facility] OR;  Service: Orthopedics;  Laterality: Right;   Family History  Problem Relation Age of Onset  . Heart attack Father   . Heart disease Father        MI  . Diabetes Father   . Hypertension Father   . Early death Father   . Hyperlipidemia Father   . Cancer Mother        breast cancer  . Breast cancer Mother 62  . Arthritis Mother   . Dementia Mother   . Varicose Veins Mother   . Aneurysm Brother        cardiac   . Early death Brother   . Colon cancer Cousin    Social History   Socioeconomic History  . Marital status: Married    Spouse name: Not on file  . Number of children: Not on file  .  Years of education: Not on file  . Highest education level: Not on file  Occupational History  . Not on file  Tobacco Use  . Smoking status: Never Smoker  . Smokeless tobacco: Never Used  Vaping Use  . Vaping Use: Never used  Substance and Sexual Activity  . Alcohol use: Not Currently    Comment: rare  . Drug use: No  . Sexual activity: Yes    Birth control/protection: Surgical  Other Topics Concern  . 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   Allergies  Allergen Reactions  . Metoprolol Tartrate     Sob with exertion, ? Heart pounding, fatigue and heart pounding even with 12.5 mg bid dosing    . Pravastatin     lethargy and fatigue.  After about 4 weeks, I also started feeling "heavy" heart beating and generally not feeling well.  . Diltiazem Rash    CD formulation likely generalized drug rash/eruption face, trunk, arms   . Metoprolol Succinate [Metoprolol] Other (See Comments)    Succinate version causes extreme lethargy   . Tape Itching, Rash and Other (See Comments)    Reaction: blisters (adhesive tape) Paper tape is ok   Recent Results (from the past 2160 hour(s))  Urinalysis, Routine w reflex microscopic     Status: Abnormal   Collection Time: 04/02/20  7:59 AM  Result Value Ref Range   Color, Urine YELLOW YELLOW   APPearance TURBID (A) CLEAR   Specific Gravity, Urine 1.017 1.001 - 1.03   pH < OR = 5.0 5.0 - 8.0   Glucose, UA NEGATIVE NEGATIVE   Bilirubin Urine NEGATIVE NEGATIVE   Ketones, ur 1+ (A) NEGATIVE   Hgb urine dipstick NEGATIVE NEGATIVE   Protein, ur NEGATIVE NEGATIVE   Nitrite NEGATIVE NEGATIVE   Leukocytes,Ua 2+ (A) NEGATIVE   WBC, UA 10-20 (A) 0 - 5 /HPF   RBC / HPF 0-2 0 - 2 /HPF   Squamous Epithelial / LPF 10-20 (A) < OR = 5 /HPF   TRANSITIONAL EPITHELIAL CELLS 0-5 < OR = 5 /HPF   Bacteria, UA FEW (A) NONE SEEN /HPF   Uric Acid Crystal FEW NONE OR FE /HPF   Hyaline Cast 1-3 (A) NONE SEEN /LPF   Granular Cast 0-1 (A) NONE SEEN /LPF  CBC with Differential/Platelet     Status: Abnormal   Collection Time: 04/02/20  7:59 AM  Result Value Ref Range   WBC 4.5 4.0 - 10.5 K/uL   RBC 4.81 3.87 - 5.11 Mil/uL   Hemoglobin 14.6 12.0 - 15.0 g/dL   HCT 17.9 15.0 - 56.9 %   MCV 90.5 78.0 - 100.0 fl   MCHC 33.4 30.0 - 36.0 g/dL   RDW 79.4 80.1 - 65.5 %   Platelets 223.0 150.0 - 400.0 K/uL   Neutrophils Relative % 46.2 43.0 - 77.0 %   Lymphocytes Relative 29.8 12.0 -  46.0 %   Monocytes  Relative 12.6 (H) 3.0 - 12.0 %   Eosinophils Relative 9.9 (H) 0.0 - 5.0 %   Basophils Relative 1.5 0.0 - 3.0 %   Neutro Abs 2.1 1.4 - 7.7 K/uL   Lymphs Abs 1.3 0.7 - 4.0 K/uL   Monocytes Absolute 0.6 0.1 - 1.0 K/uL   Eosinophils Absolute 0.4 0.0 - 0.7 K/uL   Basophils Absolute 0.1 0.0 - 0.1 K/uL  Hemoglobin A1c     Status: None   Collection Time: 04/02/20  7:59 AM  Result Value Ref Range   Hgb A1c MFr Bld 6.2 4.6 - 6.5 %    Comment: Glycemic Control Guidelines for People with Diabetes:Non Diabetic:  <6%Goal of Therapy: <7%Additional Action Suggested:  >8%   Lipid panel     Status: Abnormal   Collection Time: 04/02/20  7:59 AM  Result Value Ref Range   Cholesterol 234 (H) 0 - 200 mg/dL    Comment: ATP III Classification       Desirable:  < 200 mg/dL               Borderline High:  200 - 239 mg/dL          High:  > = 160 mg/dL   Triglycerides 73.7 0.0 - 149.0 mg/dL    Comment: Normal:  <106 mg/dLBorderline High:  150 - 199 mg/dL   HDL 26.94 >85.46 mg/dL   VLDL 27.0 0.0 - 35.0 mg/dL   LDL Cholesterol 093 (H) 0 - 99 mg/dL   Total CHOL/HDL Ratio 4     Comment:                Men          Women1/2 Average Risk     3.4          3.3Average Risk          5.0          4.42X Average Risk          9.6          7.13X Average Risk          15.0          11.0                       NonHDL 171.92     Comment: NOTE:  Non-HDL goal should be 30 mg/dL higher than patient's LDL goal (i.e. LDL goal of < 70 mg/dL, would have non-HDL goal of < 100 mg/dL)  Comprehensive metabolic panel     Status: None   Collection Time: 04/02/20  7:59 AM  Result Value Ref Range   Sodium 141 135 - 145 mEq/L   Potassium 4.2 3.5 - 5.1 mEq/L   Chloride 104 96 - 112 mEq/L   CO2 28 19 - 32 mEq/L   Glucose, Bld 89 70 - 99 mg/dL   BUN 21 6 - 23 mg/dL   Creatinine, Ser 8.18 0.40 - 1.20 mg/dL   Total Bilirubin 0.7 0.2 - 1.2 mg/dL   Alkaline Phosphatase 75 39 - 117 U/L   AST 19 0 - 37 U/L   ALT 13 0 - 35 U/L    Total Protein 6.8 6.0 - 8.3 g/dL   Albumin 4.1 3.5 - 5.2 g/dL   GFR 29.93 >71.69 mL/min    Comment: Calculated using the CKD-EPI Creatinine Equation (2021)   Calcium 9.5 8.4 - 10.5 mg/dL   Objective  There is no height or  weight on file to calculate BMI. Wt Readings from Last 3 Encounters:  05/02/20 177 lb 9.6 oz (80.6 kg)  04/04/20 179 lb 3.2 oz (81.3 kg)  09/30/19 183 lb 6.4 oz (83.2 kg)   Temp Readings from Last 3 Encounters:  04/04/20 98.2 F (36.8 C) (Oral)  09/30/19 (!) 97.3 F (36.3 C) (Temporal)  08/26/19 98.5 F (36.9 C) (Oral)   BP Readings from Last 3 Encounters:  04/04/20 (!) 146/90  09/30/19 132/84  08/26/19 103/69   Pulse Readings from Last 3 Encounters:  04/04/20 75  09/30/19 60  08/26/19 60    Physical Exam  Assessment  Plan  No diagnosis found.  Provider: Dr. French Ana McLean-Scocuzza-Internal Medicine

## 2020-06-28 DIAGNOSIS — M5451 Vertebrogenic low back pain: Secondary | ICD-10-CM | POA: Diagnosis not present

## 2020-06-28 DIAGNOSIS — R293 Abnormal posture: Secondary | ICD-10-CM | POA: Diagnosis not present

## 2020-07-02 DIAGNOSIS — R293 Abnormal posture: Secondary | ICD-10-CM | POA: Diagnosis not present

## 2020-07-02 DIAGNOSIS — M5451 Vertebrogenic low back pain: Secondary | ICD-10-CM | POA: Diagnosis not present

## 2020-07-04 DIAGNOSIS — M5451 Vertebrogenic low back pain: Secondary | ICD-10-CM | POA: Diagnosis not present

## 2020-07-04 DIAGNOSIS — R293 Abnormal posture: Secondary | ICD-10-CM | POA: Diagnosis not present

## 2020-07-06 DIAGNOSIS — M818 Other osteoporosis without current pathological fracture: Secondary | ICD-10-CM | POA: Diagnosis not present

## 2020-07-09 DIAGNOSIS — R293 Abnormal posture: Secondary | ICD-10-CM | POA: Diagnosis not present

## 2020-07-09 DIAGNOSIS — M5451 Vertebrogenic low back pain: Secondary | ICD-10-CM | POA: Diagnosis not present

## 2020-07-11 DIAGNOSIS — M5451 Vertebrogenic low back pain: Secondary | ICD-10-CM | POA: Diagnosis not present

## 2020-07-11 DIAGNOSIS — R293 Abnormal posture: Secondary | ICD-10-CM | POA: Diagnosis not present

## 2020-07-16 DIAGNOSIS — M5451 Vertebrogenic low back pain: Secondary | ICD-10-CM | POA: Diagnosis not present

## 2020-07-16 DIAGNOSIS — R293 Abnormal posture: Secondary | ICD-10-CM | POA: Diagnosis not present

## 2020-07-18 DIAGNOSIS — R293 Abnormal posture: Secondary | ICD-10-CM | POA: Diagnosis not present

## 2020-07-18 DIAGNOSIS — M5451 Vertebrogenic low back pain: Secondary | ICD-10-CM | POA: Diagnosis not present

## 2020-07-19 NOTE — Telephone Encounter (Signed)
Faxed  Signed  Discharge report to [facility] physical therapy faxed to [phone] on 07-19-20

## 2020-08-07 DIAGNOSIS — Z20822 Contact with and (suspected) exposure to covid-19: Secondary | ICD-10-CM | POA: Diagnosis not present

## 2020-08-09 ENCOUNTER — Other Ambulatory Visit: Payer: Medicare PPO

## 2020-08-17 DIAGNOSIS — Z20822 Contact with and (suspected) exposure to covid-19: Secondary | ICD-10-CM | POA: Diagnosis not present

## 2020-09-27 ENCOUNTER — Other Ambulatory Visit (INDEPENDENT_AMBULATORY_CARE_PROVIDER_SITE_OTHER): Payer: Medicare PPO

## 2020-09-27 ENCOUNTER — Other Ambulatory Visit: Payer: Self-pay

## 2020-09-27 DIAGNOSIS — R7303 Prediabetes: Secondary | ICD-10-CM

## 2020-09-27 DIAGNOSIS — I10 Essential (primary) hypertension: Secondary | ICD-10-CM

## 2020-09-27 DIAGNOSIS — E785 Hyperlipidemia, unspecified: Secondary | ICD-10-CM | POA: Diagnosis not present

## 2020-09-27 LAB — CBC WITH DIFFERENTIAL/PLATELET
Basophils Absolute: 0 10*3/uL (ref 0.0–0.1)
Basophils Relative: 1.2 % (ref 0.0–3.0)
Eosinophils Absolute: 0.2 10*3/uL (ref 0.0–0.7)
Eosinophils Relative: 7.6 % — ABNORMAL HIGH (ref 0.0–5.0)
HCT: 43.4 % (ref 36.0–46.0)
Hemoglobin: 14.5 g/dL (ref 12.0–15.0)
Lymphocytes Relative: 44.7 % (ref 12.0–46.0)
Lymphs Abs: 1.4 10*3/uL (ref 0.7–4.0)
MCHC: 33.4 g/dL (ref 30.0–36.0)
MCV: 91.8 fl (ref 78.0–100.0)
Monocytes Absolute: 0.4 10*3/uL (ref 0.1–1.0)
Monocytes Relative: 13.8 % — ABNORMAL HIGH (ref 3.0–12.0)
Neutro Abs: 1 10*3/uL — ABNORMAL LOW (ref 1.4–7.7)
Neutrophils Relative %: 32.7 % — ABNORMAL LOW (ref 43.0–77.0)
Platelets: 195 10*3/uL (ref 150.0–400.0)
RBC: 4.73 Mil/uL (ref 3.87–5.11)
RDW: 13.6 % (ref 11.5–15.5)
WBC: 3.1 10*3/uL — ABNORMAL LOW (ref 4.0–10.5)

## 2020-09-27 LAB — COMPREHENSIVE METABOLIC PANEL
ALT: 14 U/L (ref 0–35)
AST: 18 U/L (ref 0–37)
Albumin: 4 g/dL (ref 3.5–5.2)
Alkaline Phosphatase: 79 U/L (ref 39–117)
BUN: 20 mg/dL (ref 6–23)
CO2: 30 mEq/L (ref 19–32)
Calcium: 9.4 mg/dL (ref 8.4–10.5)
Chloride: 107 mEq/L (ref 96–112)
Creatinine, Ser: 0.77 mg/dL (ref 0.40–1.20)
GFR: 77.03 mL/min (ref >60.00)
Glucose, Bld: 96 mg/dL (ref 70–99)
Potassium: 4.2 mEq/L (ref 3.5–5.1)
Sodium: 142 mEq/L (ref 135–145)
Total Bilirubin: 0.8 mg/dL (ref 0.2–1.2)
Total Protein: 6.5 g/dL (ref 6.0–8.3)

## 2020-09-27 LAB — LIPID PANEL
Cholesterol: 181 mg/dL (ref 0–200)
HDL: 59.4 mg/dL (ref >39.00)
LDL Cholesterol: 107 mg/dL — ABNORMAL HIGH (ref 0–99)
NonHDL: 121.35
Total CHOL/HDL Ratio: 3
Triglycerides: 74 mg/dL (ref 0.0–149.0)
VLDL: 14.8 mg/dL (ref 0.0–40.0)

## 2020-09-27 LAB — HEMOGLOBIN A1C: Hgb A1c MFr Bld: 6.1 % (ref 4.6–6.5)

## 2020-10-01 ENCOUNTER — Ambulatory Visit (INDEPENDENT_AMBULATORY_CARE_PROVIDER_SITE_OTHER): Payer: Medicare PPO

## 2020-10-01 VITALS — Ht 61.0 in | Wt 177.0 lb

## 2020-10-01 DIAGNOSIS — Z0000 Encounter for general adult medical examination without abnormal findings: Secondary | ICD-10-CM

## 2020-10-01 NOTE — Patient Instructions (Addendum)
Kristy Mejia , Thank you for taking time to come for your Medicare Wellness Visit. I appreciate your ongoing commitment to your health goals. Please review the following plan we discussed and let me know if I can assist you in the future.   These are the goals we discussed: Goals      Patient Stated   .  Increase physical activity (pt-stated)      Increase use of stationary bike for exercise Use walking sticks to help with balance       This is a list of the screening recommended for you and due dates:  Health Maintenance  Topic Date Due  . Pneumonia vaccines (1 of 2 - PCV13) 10/02/2020*  . Flu Shot  12/17/2020  . Mammogram  02/16/2022  . Colon Cancer Screening  03/22/2028  . Tetanus Vaccine  09/29/2029  . DEXA scan (bone density measurement)  Completed  . COVID-19 Vaccine  Completed  . Hepatitis C Screening: USPSTF Recommendation to screen - Ages 51-79 yo.  Completed  . HPV Vaccine  Aged Out  *Topic was postponed. The date shown is not the original due date.   Keep all routine maintenance appointments.   Follow up 10/02/20 @ 9:00  Advanced directives: on file  Conditions/risks identified: none new.   Follow up in one year for your annual wellness visit    Preventive Care 65 Years and Older, Female Preventive care refers to lifestyle choices and visits with your health care provider that can promote health and wellness. What does preventive care include?  A yearly physical exam. This is also called an annual well check.  Dental exams once or twice a year.  Routine eye exams. Ask your health care provider how often you should have your eyes checked.  Personal lifestyle choices, including:  Daily care of your teeth and gums.  Regular physical activity.  Eating a healthy diet.  Avoiding tobacco and drug use.  Limiting alcohol use.  Practicing safe sex.  Taking low-dose aspirin every day.  Taking vitamin and mineral supplements as recommended by your health  care provider. What happens during an annual well check? The services and screenings done by your health care provider during your annual well check will depend on your age, overall health, lifestyle risk factors, and family history of disease. Counseling  Your health care provider may ask you questions about your:  Alcohol use.  Tobacco use.  Drug use.  Emotional well-being.  Home and relationship well-being.  Sexual activity.  Eating habits.  History of falls.  Memory and ability to understand (cognition).  Work and work Astronomer.  Reproductive health. Screening  You may have the following tests or measurements:  Height, weight, and BMI.  Blood pressure.  Lipid and cholesterol levels. These may be checked every 5 years, or more frequently if you are over 37 years old.  Skin check.  Lung cancer screening. You may have this screening every year starting at age 20 if you have a 30-pack-year history of smoking and currently smoke or have quit within the past 15 years.  Fecal occult blood test (FOBT) of the stool. You may have this test every year starting at age 69.  Flexible sigmoidoscopy or colonoscopy. You may have a sigmoidoscopy every 5 years or a colonoscopy every 10 years starting at age 5.  Hepatitis C blood test.  Hepatitis B blood test.  Sexually transmitted disease (STD) testing.  Diabetes screening. This is done by checking your blood sugar (glucose) after  you have not eaten for a while (fasting). You may have this done every 1-3 years.  Bone density scan. This is done to screen for osteoporosis. You may have this done starting at age 58.  Mammogram. This may be done every 1-2 years. Talk to your health care provider about how often you should have regular mammograms. Talk with your health care provider about your test results, treatment options, and if necessary, the need for more tests. Vaccines  Your health care provider may recommend certain  vaccines, such as:  Influenza vaccine. This is recommended every year.  Tetanus, diphtheria, and acellular pertussis (Tdap, Td) vaccine. You may need a Td booster every 10 years.  Zoster vaccine. You may need this after age 30.  Pneumococcal 13-valent conjugate (PCV13) vaccine. One dose is recommended after age 86.  Pneumococcal polysaccharide (PPSV23) vaccine. One dose is recommended after age 45. Talk to your health care provider about which screenings and vaccines you need and how often you need them. This information is not intended to replace advice given to you by your health care provider. Make sure you discuss any questions you have with your health care provider. Document Released: 06/01/2015 Document Revised: 01/23/2016 Document Reviewed: 03/06/2015 Elsevier Interactive Patient Education  2017 [employer].  Fall Prevention in the Home Falls can cause injuries. They can happen to people of all ages. There are many things you can do to make your home safe and to help prevent falls. What can I do on the outside of my home?  Regularly fix the edges of walkways and driveways and fix any cracks.  Remove anything that might make you trip as you walk through a door, such as a raised step or threshold.  Trim any bushes or trees on the path to your home.  Use bright outdoor lighting.  Clear any walking paths of anything that might make someone trip, such as rocks or tools.  Regularly check to see if handrails are loose or broken. Make sure that both sides of any steps have handrails.  Any raised decks and porches should have guardrails on the edges.  Have any leaves, snow, or ice cleared regularly.  Use sand or salt on walking paths during winter.  Clean up any spills in your garage right away. This includes oil or grease spills. What can I do in the bathroom?  Use night lights.  Install grab bars by the toilet and in the tub and shower. Do not use towel bars as grab  bars.  Use non-skid mats or decals in the tub or shower.  If you need to sit down in the shower, use a plastic, non-slip stool.  Keep the floor dry. Clean up any water that spills on the floor as soon as it happens.  Remove soap buildup in the tub or shower regularly.  Attach bath mats securely with double-sided non-slip rug tape.  Do not have throw rugs and other things on the floor that can make you trip. What can I do in the bedroom?  Use night lights.  Make sure that you have a light by your bed that is easy to reach.  Do not use any sheets or blankets that are too big for your bed. They should not hang down onto the floor.  Have a firm chair that has side arms. You can use this for support while you get dressed.  Do not have throw rugs and other things on the floor that can make you trip.  What can I do in the kitchen?  Clean up any spills right away.  Avoid walking on wet floors.  Keep items that you use a lot in easy-to-reach places.  If you need to reach something above you, use a strong step stool that has a grab bar.  Keep electrical cords out of the way.  Do not use floor polish or wax that makes floors slippery. If you must use wax, use non-skid floor wax.  Do not have throw rugs and other things on the floor that can make you trip. What can I do with my stairs?  Do not leave any items on the stairs.  Make sure that there are handrails on both sides of the stairs and use them. Fix handrails that are broken or loose. Make sure that handrails are as long as the stairways.  Check any carpeting to make sure that it is firmly attached to the stairs. Fix any carpet that is loose or worn.  Avoid having throw rugs at the top or bottom of the stairs. If you do have throw rugs, attach them to the floor with carpet tape.  Make sure that you have a light switch at the top of the stairs and the bottom of the stairs. If you do not have them, ask someone to add them for  you. What else can I do to help prevent falls?  Wear shoes that:  Do not have high heels.  Have rubber bottoms.  Are comfortable and fit you well.  Are closed at the toe. Do not wear sandals.  If you use a stepladder:  Make sure that it is fully opened. Do not climb a closed stepladder.  Make sure that both sides of the stepladder are locked into place.  Ask someone to hold it for you, if possible.  Clearly mark and make sure that you can see:  Any grab bars or handrails.  First and last steps.  Where the edge of each step is.  Use tools that help you move around (mobility aids) if they are needed. These include:  Canes.  Walkers.  Scooters.  Crutches.  Turn on the lights when you go into a dark area. Replace any light bulbs as soon as they burn out.  Set up your furniture so you have a clear path. Avoid moving your furniture around.  If any of your floors are uneven, fix them.  If there are any pets around you, be aware of where they are.  Review your medicines with your doctor. Some medicines can make you feel dizzy. This can increase your chance of falling. Ask your doctor what other things that you can do to help prevent falls. This information is not intended to replace advice given to you by your health care provider. Make sure you discuss any questions you have with your health care provider. Document Released: 03/01/2009 Document Revised: 10/11/2015 Document Reviewed: 06/09/2014 Elsevier Interactive Patient Education  2017 [employer].

## 2020-10-01 NOTE — Progress Notes (Addendum)
Subjective:   Danyell Yolande JollyS Arbogast is a 75 y.o. female who presents for Medicare Annual (Subsequent) preventive examination.  Review of Systems    No ROS.  Medicare Wellness Virtual Visit.  Visual/audio telehealth visit, UTA vital signs.   See social history for additional risk factors.   Cardiac Risk Factors include: advanced age (>2355men, 62>65 women)     Objective:    Today's Vitals   10/01/20 1244  Weight: 177 lb (80.3 kg)  Height: 5\' 1"  (1.549 m)   Body mass index is 33.44 kg/m.  Advanced Directives 10/01/2020 05/02/2020 09/29/2019 08/26/2019 07/31/2019 10/08/2018 09/28/2018  Does Patient Have a Medical Advance Directive? Yes Yes Yes No No Yes Yes  Type of Estate agentAdvance Directive Healthcare Power of [place]orney;Living will Healthcare Power of [place]orney;Living will Living will;Healthcare Power of [phone] Drivettorney - - Healthcare Power of [place]orney;Living will Healthcare Power of [place]orney;Living will  Does patient want to make changes to medical advance directive? No - Patient declined - No - Patient declined - - No - Patient declined No - Patient declined  Copy of Healthcare Power of Attorney in Chart? Yes - validated most recent copy scanned in chart (See row information) - Yes - validated most recent copy scanned in chart (See row information) - - - Yes - validated most recent copy scanned in chart (See row information)  Would patient like information on creating a medical advance directive? - - - No - Patient declined No - Patient declined - -    Current Medications (verified) Outpatient Encounter Medications as of 10/01/2020  Medication Sig  . acetaminophen (TYLENOL) 325 MG tablet   . aspirin EC 81 MG tablet Take 81 mg by mouth daily.  Marland Kitchen. ezetimibe (ZETIA) 10 MG tablet Take 1 tablet (10 mg total) by mouth daily. (Patient not taking: Reported on 10/01/2020)  . famotidine (PEPCID) 20 MG tablet Take 20 mg by mouth 2 (two) times daily as needed for heartburn or indigestion.  . meloxicam (MOBIC) 15 MG tablet Take  15 mg by mouth daily as needed.    No facility-administered encounter medications on file as of 10/01/2020.    Allergies (verified) Metoprolol tartrate, Pravastatin, Diltiazem, Metoprolol succinate [metoprolol], and Tape   History: Past Medical History:  Diagnosis Date  . Arthritis   . Atypical chest pain    a. 04/2015 Myoview: EF 78%, breast attenuation, no ischemia-->Low risk.  . Back pain    scoliosis  . Chicken pox   . Dysrhythmia    hx palpitations  . GERD (gastroesophageal reflux disease)   . History of hiatal hernia    noted on cxr  . Hyperlipidemia   . Joint pain   . Obesity, unspecified   . Paroxysmal SVT (supraventricular tachycardia) (HCC)    a. 2013 Holter: PACs/PVCs; b. 10/2011 Ehco: EF nl, no rwma, mild LVH, mild TR, PASP 34mmHg; c 04/2015 SVT in ED->resolved with adenosine; c.   . Pneumonia 2010   hx of  . Scoliosis    multiple areas of back follows with Dr. Dyke MaesKen Brown chiropractor    Past Surgical History:  Procedure Laterality Date  . ABDOMINAL HYSTERECTOMY     1990 ovaries intact. had 2/2 fibroids last pap 2004 neg.  ? if cervix present   . BACK SURGERY  1960's   d/t scoliosis-1964/65?  Marland Kitchen. BACK SURGERY     x 3   . BREAST BIOPSY Left 2008   CORE W/CLIP - NEG  . BREAST BIOPSY     2000   .  BREAST EXCISIONAL BIOPSY Right 20 + yrs ago  . BREAST SURGERY  1990   excision breast mass  . COLONOSCOPY  2007   Dr. Mechele Collin  . COLONOSCOPY WITH ESOPHAGOGASTRODUODENOSCOPY (EGD) AND ESOPHAGEAL DILATION (ED)    . COLONOSCOPY WITH PROPOFOL N/A 03/22/2018   Procedure: COLONOSCOPY WITH PROPOFOL;  Surgeon: Scot Jun, MD;  Location[facility] ENDOSCOPY;  Service: Endoscopy;  Laterality: N/A;  . ESOPHAGOGASTRODUODENOSCOPY (EGD) WITH PROPOFOL N/A 03/22/2018   Procedure: ESOPHAGOGASTRODUODENOSCOPY (EGD) WITH PROPOFOL;  Surgeon: Scot Jun, MD;  Location: [facility] ENDOSCOPY;  Service: Endoscopy;  Laterality: N/A;  . EYE SURGERY  2007   cataracts  . FOOT SURGERY  1960's   . JOINT REPLACEMENT     total hip left with metal hardward 2016 Dr. Alphonsa Gin ortho   . OTHER SURGICAL HISTORY  2004   Bilateral cataracts  . PARTIAL HYSTERECTOMY  1990  . REVISION TOTAL HIP ARTHROPLASTY Left 04/17/2014   DR Turner Daniels  . TOTAL HIP ARTHROPLASTY  2006  . TOTAL HIP REVISION Left 04/17/2014   Procedure: LEFT TOTAL HIP REVISION;  Surgeon: Nestor Lewandowsky, MD;  Location: [facility] OR;  Service: Orthopedics;  Laterality: Left;  . TOTAL KNEE ARTHROPLASTY Right 10/30/2017   Procedure: RIGHT TOTAL KNEE ARTHROPLASTY;  Surgeon: Gean Birchwood, MD;  Location: [facility] OR;  Service: Orthopedics;  Laterality: Right;   Family History  Problem Relation Age of Onset  . Heart attack Father   . Heart disease Father        MI  . Diabetes Father   . Hypertension Father   . Early death Father   . Hyperlipidemia Father   . Cancer Mother        breast cancer  . Breast cancer Mother 40  . Arthritis Mother   . Dementia Mother   . Varicose Veins Mother   . Aneurysm Brother        cardiac   . Early death Brother   . Colon cancer Cousin    Social History   Socioeconomic History  . Marital status: Married    Spouse name: Not on file  . Number of children: Not on file  . Years of education: Not on file  . Highest education level: Not on file  Occupational History  . Not on file  Tobacco Use  . Smoking status: Never Smoker  . Smokeless tobacco: Never Used  Vaping Use  . Vaping Use: Never used  Substance and Sexual Activity  . Alcohol use: Not Currently    Comment: rare  . Drug use: No  . Sexual activity: Yes    Birth control/protection: Surgical  Other Topics Concern  . 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 Difficulty of Paying Living Expenses: Not hard at all  Food Insecurity: No Food Insecurity  .  Worried About Programme researcher, broadcasting/film/video in the Last Year: Never true  . Ran Out of Food in the Last Year: Never true  Transportation Needs: No Transportation Needs  . Lack of Transportation (Medical): No  . Lack of Transportation (Non-Medical): No  Physical Activity: Sufficiently Active  . Days of Exercise per Week: 5 days  . Minutes of Exercise per Session: 30 min  Stress: No Stress Concern Present  . Feeling of Stress :  Not at all  Social Connections: Unknown  . Frequency of Communication with Friends and Family: More than three times a week  . Frequency of Social Gatherings with Friends and Family: More than three times a week  . Attends Religious Services: Not on file  . Active Member of Clubs or Organizations: Not on file  . Attends Banker Meetings: Not on file  . Marital Status: Not on file    Tobacco Counseling Counseling given: Not Answered   Clinical Intake:  Pre-visit preparation completed: Yes        Diabetes: No  How often do you need to have someone help you when you read instructions, pamphlets, or other written materials from your doctor or pharmacy?: 1 - Never    Interpreter Needed?: No      Activities of Daily Living In your present state of health, do you have any difficulty performing the following activities: 10/01/2020  Hearing? N  Vision? N  Difficulty concentrating or making decisions? N  Walking or climbing stairs? N  Comment Paces self.  Dressing or bathing? N  Doing errands, shopping? N  Preparing Food and eating ? N  Using the Toilet? N  In the past six months, have you accidently leaked urine? N  Do you have problems with loss of bowel control? N  Managing your Medications? N  Managing your Finances? N  Housekeeping or managing your Housekeeping? N  Some recent data might be hidden    Patient Care Team: McLean-Scocuzza, Pasty Spillers, MD as PCP - General (Internal Medicine) Iran Ouch, MD as PCP - Cardiology  (Cardiology) Kieth Brightly, MD (General Surgery)  Indicate any recent Medical Services you may have received from other than [facility] providers in the past year (date may be approximate).     Assessment:   This is a routine wellness examination for Char.  I connected with Tanashia today by telephone and verified that I am speaking with the correct person using two identifiers. Location patient: home Location provider: work Persons participating in the virtual visit: patient, Engineer, civil (consulting).    I discussed the limitations, risks, security and privacy concerns of performing an evaluation and management service by telephone and the availability of in person appointments. The patient expressed understanding and verbally consented to this telephonic visit.    Interactive audio and video telecommunications were attempted between this provider and patient, however failed, due to patient having technical difficulties OR patient did not have access to video capability.  We continued and completed visit with audio only.  Some vital signs may be absent or patient reported.   Hearing/Vision screen  Hearing Screening   125Hz  250Hz  500Hz  1000Hz  2000Hz  3000Hz  4000Hz  6000Hz  8000Hz   Right ear:           Left ear:           Comments: Patient is able to hear conversational tones without difficulty.  No issues reported.  Vision Screening Comments: Followed by [facility]  Wears corrective lenses when reading.  Cataract extraction, bilateral  Visual acuity not assessed, virtual visit. They plan to schedule an eye exam with their ophthalmologist.  Dietary issues and exercise activities discussed: Current Exercise Habits: Home exercise routine, Time (Minutes): 30, Frequency (Times/Week): 5, Weekly Exercise (Minutes/Week): 150, Intensity: Mild  Monitoring salt intake, low carb, reduced sugar/fat Good water intake  Goals Addressed              This Visit's Progress     Patient  Stated   .  Increase  physical activity (pt-stated)        Increase use of stationary bike for exercise Use walking sticks to help with balance      Depression Screen PHQ 2/9 Scores 10/01/2020 05/02/2020 09/30/2019 09/29/2019 09/28/2018 01/19/2018 09/18/2017  PHQ - 2 Score 0 0 0 0 0 0 0    Fall Risk Fall Risk  10/01/2020 05/02/2020 04/04/2020 09/30/2019 09/29/2019  Falls in the past year? 0 0 0 0 0  Number falls in past yr: 0 - 0 0 -  Injury with Fall? 0 - 0 0 -  Follow up Falls evaluation completed - Falls evaluation completed Falls evaluation completed Falls evaluation completed    FALL RISK PREVENTION PERTAINING TO THE HOME: Handrails in use when climbing stairs? Yes Home free of loose throw rugs in walkways, pet beds, electrical cords, etc? Yes  Adequate lighting in your home to reduce risk of falls? Yes   ASSISTIVE DEVICES UTILIZED TO PREVENT FALLS: Life alert? No  Use of a cane, walker or w/c? No  Grab bars in the bathroom? Yes  Shower chair or bench in shower? Yes  Chair height toilet? Yes    TIMED UP AND GO: Was the test performed? No . Virtual visit.   Cognitive Function: Patient is alert and oriented x3.  Denies difficulty focusing, making decisions, memory loss.  Enjoys working outside in the garden, uses the computer, reading, cross stitch and other brain health activities.  MMSE/6CIT deferred. Normal by direct communication/observation.   MMSE - Mini Mental State Exam 09/24/2017  Orientation to time 5  Orientation to Place 5  Registration 3  Attention/ Calculation 5  Recall 3  Language- name 2 objects 2  Language- repeat 1  Language- follow 3 step command 3  Language- read & follow direction 1  Write a sentence 1  Copy design 1  Total score 30     6CIT Screen 09/29/2019 09/28/2018  What Year? 0 points 0 points  What month? 0 points 0 points  What time? 0 points 0 points  Count back from 20 - 0 points  Months in reverse 0 points 0 points  Repeat phrase - 0 points  Total Score - 0     Immunizations Immunization History  Administered Date(s) Administered  . Moderna Sars-Covid-2 Vaccination 06/30/2019, 07/28/2019, 03/29/2020  . Tdap 09/30/2019   Health Maintenance Health Maintenance  Topic Date Due  . PNA vac Low Risk Adult (1 of 2 - PCV13) 10/02/2020 (Originally 04/01/2013)  . INFLUENZA VACCINE  12/17/2020  . MAMMOGRAM  02/16/2022  . COLONOSCOPY (Pts 45-2yrs Insurance coverage will need to be confirmed)  03/22/2028  . TETANUS/TDAP  09/29/2029  . DEXA SCAN  Completed  . COVID-19 Vaccine  Completed  . Hepatitis C Screening  Completed  . HPV VACCINES  Aged Out   Colorectal cancer screening: Type of screening: Colonoscopy. Completed 03/22/18. Repeat every 10 years   Mammogram status: Completed 02/17/20. Repeat every year  Bone Density status: Completed 2021. Results reflect: Bone density results: NORMAL. Repeat every 5 years.    PNA- declined.   Lung Cancer Screening: (Low Dose CT Chest recommended if Age 44-80 years, 30 pack-year currently smoking OR have quit w/in 15years.) does not qualify.   Dental Screening: Recommended annual dental exams for proper oral hygiene.  Community Resource Referral / Chronic Care Management: CRR required this visit?  No   CCM required this visit?  No      Plan:  Keep all routine maintenance appointments.   Follow up 10/02/20 @ 9:00  I have personally reviewed and noted the following in the patient's chart:   . Medical and social history . Use of alcohol, tobacco or illicit drugs  . Current medications and supplements including opioid prescriptions. Patient has not started Zetia; prefers to discuss more options with pcp. Patient is not taking opioids.  . Functional ability and status . Nutritional status . Physical activity . Advanced directives . List of other physicians . Hospitalizations, surgeries, and ER visits in previous 12 months . Vitals . Screenings to include cognitive, depression, and  falls . Referrals and appointments  In addition, I have reviewed and discussed with patient certain preventive protocols, quality metrics, and best practice recommendations. A written personalized care plan for preventive services as well as general preventive health recommendations were provided to patient via mychart.     Ashok Pall, LPN   [id number]

## 2020-10-02 ENCOUNTER — Ambulatory Visit: Payer: Medicare PPO | Admitting: Internal Medicine

## 2020-10-02 ENCOUNTER — Encounter: Payer: Self-pay | Admitting: Internal Medicine

## 2020-10-02 ENCOUNTER — Other Ambulatory Visit: Payer: Self-pay

## 2020-10-02 ENCOUNTER — Ambulatory Visit (INDEPENDENT_AMBULATORY_CARE_PROVIDER_SITE_OTHER): Payer: Medicare PPO

## 2020-10-02 VITALS — BP 144/92 | HR 86 | Temp 98.2°F | Ht 61.0 in | Wt 170.8 lb

## 2020-10-02 DIAGNOSIS — I10 Essential (primary) hypertension: Secondary | ICD-10-CM | POA: Diagnosis not present

## 2020-10-02 DIAGNOSIS — M19011 Primary osteoarthritis, right shoulder: Secondary | ICD-10-CM

## 2020-10-02 DIAGNOSIS — M67912 Unspecified disorder of synovium and tendon, left shoulder: Secondary | ICD-10-CM

## 2020-10-02 DIAGNOSIS — E785 Hyperlipidemia, unspecified: Secondary | ICD-10-CM | POA: Diagnosis not present

## 2020-10-02 DIAGNOSIS — M25512 Pain in left shoulder: Secondary | ICD-10-CM | POA: Diagnosis not present

## 2020-10-02 DIAGNOSIS — M419 Scoliosis, unspecified: Secondary | ICD-10-CM | POA: Diagnosis not present

## 2020-10-02 DIAGNOSIS — R6881 Early satiety: Secondary | ICD-10-CM

## 2020-10-02 DIAGNOSIS — Z1231 Encounter for screening mammogram for malignant neoplasm of breast: Secondary | ICD-10-CM | POA: Diagnosis not present

## 2020-10-02 DIAGNOSIS — M25511 Pain in right shoulder: Secondary | ICD-10-CM

## 2020-10-02 DIAGNOSIS — R1013 Epigastric pain: Secondary | ICD-10-CM

## 2020-10-02 DIAGNOSIS — K449 Diaphragmatic hernia without obstruction or gangrene: Secondary | ICD-10-CM | POA: Diagnosis not present

## 2020-10-02 DIAGNOSIS — M19012 Primary osteoarthritis, left shoulder: Secondary | ICD-10-CM | POA: Diagnosis not present

## 2020-10-02 MED ORDER — LOVASTATIN 10 MG PO TABS: 10.0000 mg | ORAL_TABLET | ORAL | 3 refills | Status: DC

## 2020-10-02 MED ORDER — NEBIVOLOL HCL 2.5 MG PO TABS: 2.5000 mg | ORAL_TABLET | Freq: Every day | ORAL | 0 refills | Status: DC

## 2020-10-02 NOTE — Patient Instructions (Addendum)
Dr. Caryl Comes or Dr. Christ[facility] female  [phone] Not available [address] [place] [address]      Specialties     Gastroenterology           Consider prevnar vaccine for pneumonia in the future at the pharmacy  Monitor BP goal <130/<80  Pneumococcal Conjugate Vaccine (Prevnar 13) Suspension for Injection What is this medicine? PNEUMOCOCCAL VACCINE (NEU mo KOK al vak SEEN) is a vaccine used to prevent pneumococcus bacterial infections. These bacteria can cause serious infections like pneumonia, meningitis, and blood infections. This vaccine will lower your chance of getting pneumonia. If you do get pneumonia, it can make your symptoms milder and your illness shorter. This vaccine will not treat an infection and will not cause infection. This vaccine is recommended for infants and young children, adults with certain medical conditions, and adults 65 years or older. This medicine may be used for other purposes; ask your health care provider or pharmacist if you have questions. COMMON BRAND NAME(S): Prevnar, Prevnar 13 What should I tell my health care provider before I take this medicine? They need to know if you have any of these conditions:  bleeding problems  fever  immune system problems  an unusual or allergic reaction to pneumococcal vaccine, diphtheria toxoid, other vaccines, latex, other medicines, foods, dyes, or preservatives  pregnant or trying to get pregnant  breast-feeding How should I use this medicine? This vaccine is for injection into a muscle. It is given by a health care professional. A copy of Vaccine Information Statements will be given before each vaccination. Read this sheet carefully each time. The sheet may change frequently. Talk to your pediatrician regarding the use of this medicine in children. While this drug may be prescribed for children as young as 5 weeks old for selected conditions, precautions do apply. Overdosage: If  you think you have taken too much of this medicine contact a poison control center or emergency room at once. NOTE: This medicine is only for you. Do not share this medicine with others. What if I miss a dose? It is important not to miss your dose. Call your doctor or health care professional if you are unable to keep an appointment. What may interact with this medicine?  medicines for cancer chemotherapy  medicines that suppress your immune function  steroid medicines like prednisone or cortisone This list may not describe all possible interactions. Give your health care provider a list of all the medicines, herbs, non-prescription drugs, or dietary supplements you use. Also tell them if you smoke, drink alcohol, or use illegal drugs. Some items may interact with your medicine. What should I watch for while using this medicine? Mild fever and pain should go away in 3 days or less. Report any unusual symptoms to your doctor or health care professional. What side effects may I notice from receiving this medicine? Side effects that you should report to your doctor or health care professional as soon as possible:  allergic reactions like skin rash, itching or hives, swelling of the face, lips, or tongue  breathing problems  confused  fast or irregular heartbeat  fever over 102 degrees F  seizures  unusual bleeding or bruising  unusual muscle weakness Side effects that usually do not require medical attention (report to your doctor or health care professional if they continue or are bothersome):  aches and pains  diarrhea  fever of 102 degrees F or less  headache  irritable  loss of appetite  pain, tender at site where injected  trouble sleeping This list may not describe all possible side effects. Call your doctor for medical advice about side effects. You may report side effects to FDA at 1-800-FDA-1088. Where should I keep my medicine? This does not apply. This  vaccine is given in a clinic, pharmacy, doctor's office, or other health care setting and will not be stored at home. NOTE: This sheet is a summary. It may not cover all possible information. If you have questions about this medicine, talk to your doctor, pharmacist, or health care provider.  2021 Elsevier/Gold Standard (2014-02-09 10:27:27)

## 2020-10-02 NOTE — Progress Notes (Signed)
Chief Complaint  Patient presents with  . Medication Management  . Hernia   F/u  1. B/l shoulder pain and weakness x 1 month after starting lovastatin 10 mg qhs 04/2020 resumed 09/16/20 QOD and pain less but not resolved had trouble with ROm and burning keeping awake  Pain was 4/10 today max 9/10 at times with limited rom esp overhead and lifting overhead -[facility] consider referral in future  2. C/o hiatal hernia enlarging, feeling full fasting with less food, cough after food and sob after food and pepcid helps  3. HTN elevated today at home 131/78 5/16, 127/78, 127-147, 132 at times  4. hld improved stopped lovastatin 10 mg qd due to shoulder pain and weakness and resumed 09/16/20 and changed diet more fish veggies and less chicken as of 05/2020 saw nutrition   Review of Systems  Constitutional: Negative for weight loss.  HENT: Negative for hearing loss.   Eyes: Negative for blurred vision.  Respiratory: Negative for shortness of breath.   Cardiovascular: Negative for chest pain.  Gastrointestinal: Positive for abdominal pain.       +early satiety  Musculoskeletal: Positive for joint pain.  Skin: Negative for rash.  Neurological: Negative for headaches.  Psychiatric/Behavioral: Negative for depression and memory loss.   Past Medical History:  Diagnosis Date  . Arthritis   . Atypical chest pain    a. 04/2015 Myoview: EF 78%, breast attenuation, no ischemia-->Low risk.  . Back pain    scoliosis  . Chicken pox   . Dysrhythmia    hx palpitations  . GERD (gastroesophageal reflux disease)   . History of hiatal hernia    noted on cxr  . Hyperlipidemia   . Joint pain   . Obesity, unspecified   . Paroxysmal SVT (supraventricular tachycardia) ([place])    a. 2013 Holter: PACs/PVCs; b. 10/2011 Ehco: EF nl, no rwma, mild LVH, mild TR, PASP 62mHg; c 04/2015 SVT in ED->resolved with adenosine; c.   . Pneumonia 2010   hx of  . Scoliosis    multiple areas of back follows with Dr. KBoston Servicechiropractor    Past Surgical History:  Procedure Laterality Date  . ABDOMINAL HYSTERECTOMY     1990 ovaries intact. had 2/2 fibroids last pap 2004 neg.  ? if cervix present   . BACK SURGERY  1960's   d/t scoliosis-1964/65?  .Marland KitchenBACK SURGERY     x 3   . BREAST BIOPSY Left 2008   CORE W/CLIP - NEG  . BREAST BIOPSY     2000   . BREAST EXCISIONAL BIOPSY Right 20 + yrs ago  . BREAST SURGERY  1990   excision breast mass  . COLONOSCOPY  2007   Dr. EVira Agar . COLONOSCOPY WITH ESOPHAGOGASTRODUODENOSCOPY (EGD) AND ESOPHAGEAL DILATION (ED)    . COLONOSCOPY WITH PROPOFOL N/A 03/22/2018   Procedure: COLONOSCOPY WITH PROPOFOL;  Surgeon: EManya Silvas MD;  [facility]  Service: Endoscopy;  Laterality: N/A;  . ESOPHAGOGASTRODUODENOSCOPY (EGD) WITH PROPOFOL N/A 03/22/2018   Procedure: ESOPHAGOGASTRODUODENOSCOPY (EGD) WITH PROPOFOL;  Surgeon: EManya Silvas MD;  [facility];  Service: Endoscopy;  Laterality: N/A;  . EYE SURGERY  2007   cataracts  . FOOT SURGERY  1960's  . JOINT REPLACEMENT     total hip left with metal hardward 2016 Dr. RJudd Lienortho   . OTHER SURGICAL HISTORY  2004   Bilateral cataracts  . PARTIAL HYSTERECTOMY  1990  . REVISION TOTAL HIP ARTHROPLASTY  Left 04/17/2014   DR Mayer Camel  . TOTAL HIP ARTHROPLASTY  2006  . TOTAL HIP REVISION Left 04/17/2014   Procedure: LEFT TOTAL HIP REVISION;  Surgeon: Kerin Salen, MD;  Location: [facility];  Service: Orthopedics;  Laterality: Left;  . TOTAL KNEE ARTHROPLASTY Right 10/30/2017   Procedure: RIGHT TOTAL KNEE ARTHROPLASTY;  Surgeon: Frederik Pear, MD;  Location: [facility];  Service: Orthopedics;  Laterality: Right;   Family History  Problem Relation Age of Onset  . Heart attack Father   . Heart disease Father        MI  . Diabetes Father   . Hypertension Father   . Early death Father   . Hyperlipidemia Father   . Cancer Mother        breast cancer  . Breast cancer Mother 58  . Arthritis  Mother   . Dementia Mother   . Varicose Veins Mother   . Aneurysm Brother        cardiac   . Early death Brother   . Colon cancer Cousin    Social History   Socioeconomic History  . Marital status: Married    Spouse name: Not on file  . Number of children: Not on file  . Years of education: Not on file  . Highest education level: Not on file  Occupational History  . Not on file  Tobacco Use  . Smoking status: Never Smoker  . Smokeless tobacco: Never Used  Vaping Use  . Vaping Use: Never used  Substance and Sexual Activity  . Alcohol use: Not Currently    Comment: rare  . Drug use: No  . Sexual activity: Yes    Birth control/protection: Surgical  Other Topics Concern  . 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 Difficulty of Paying Living Expenses: Not hard at all  Food Insecurity: No Food Insecurity  . Worried About Charity fundraiser in the Last Year: Never true  . Ran Out of Food in the Last Year: Never true  Transportation Needs: No Transportation Needs  . Lack of Transportation (Medical): No  . Lack of Transportation (Non-Medical): No  Physical Activity: Sufficiently Active  . Days of Exercise per Week: 5 days  . Minutes of Exercise per Session: 30 min  Stress: No Stress Concern Present  . Feeling of Stress : Not at all  Social Connections: Unknown  . Frequency of Communication with Friends and Family: More than three times a week  . Frequency of Social Gatherings with Friends and Family: More than three times a week  . Attends Religious Services: Not on file  . Active Member of Clubs or Organizations: Not on file  . Attends Archivist Meetings: Not on file  . Marital Status: Not on file  Intimate Partner Violence: Not At Risk  . Fear of Current or Ex-Partner: No  .  Emotionally Abused: No  . Physically Abused: No  . Sexually Abused: No   Current Meds  Medication Sig  . acetaminophen (TYLENOL) 325 MG tablet   . aspirin EC 81 MG tablet Take 81 mg by mouth daily.  . famotidine (PEPCID) 20 MG tablet Take 20 mg by mouth 2 (two) times daily as needed for heartburn or indigestion.  . meloxicam (  MOBIC) 15 MG tablet Take 15 mg by mouth daily as needed.   . nebivolol (BYSTOLIC) 2.5 MG tablet Take 1 tablet (2.5 mg total) by mouth daily.  . [DISCONTINUED] lovastatin (MEVACOR) 10 MG tablet Take 10 mg by mouth daily.   Allergies  Allergen Reactions  . Metoprolol Tartrate     Sob with exertion, ? Heart pounding, fatigue and heart pounding even with 12.5 mg bid dosing    . Pravastatin     lethargy and fatigue.  After about 4 weeks, I also started feeling "heavy" heart beating and generally not feeling well.  . Diltiazem Rash    CD formulation likely generalized drug rash/eruption face, trunk, arms   . Metoprolol Succinate [Metoprolol] Other (See Comments)    Succinate version causes extreme lethargy   . Tape Itching, Rash and Other (See Comments)    Reaction: blisters (adhesive tape) Paper tape is ok   Recent Results (from the past 2160 hour(s))  Hemoglobin A1c     Status: None   Collection Time: 09/27/20  8:48 AM  Result Value Ref Range   Hgb A1c MFr Bld 6.1 4.6 - 6.5 %    Comment: Glycemic Control Guidelines for People with Diabetes:Non Diabetic:  <6%Goal of Therapy: <7%Additional Action Suggested:  >8%   CBC with Differential/Platelet     Status: Abnormal   Collection Time: 09/27/20  8:48 AM  Result Value Ref Range   WBC 3.1 (L) 4.0 - 10.5 K/uL   RBC 4.73 3.87 - 5.11 Mil/uL   Hemoglobin 14.5 12.0 - 15.0 g/dL   HCT 43.4 36.0 - 46.0 %   MCV 91.8 78.0 - 100.0 fl   MCHC 33.4 30.0 - 36.0 g/dL   RDW 13.6 11.5 - 15.5 %   Platelets 195.0 150.0 - 400.0 K/uL   Neutrophils Relative % 32.7 (L) 43.0 - 77.0 %   Lymphocytes Relative 44.7 12.0 - 46.0 %    Monocytes Relative 13.8 (H) 3.0 - 12.0 %   Eosinophils Relative 7.6 (H) 0.0 - 5.0 %   Basophils Relative 1.2 0.0 - 3.0 %   Neutro Abs 1.0 (L) 1.4 - 7.7 K/uL   Lymphs Abs 1.4 0.7 - 4.0 K/uL   Monocytes Absolute 0.4 0.1 - 1.0 K/uL   Eosinophils Absolute 0.2 0.0 - 0.7 K/uL   Basophils Absolute 0.0 0.0 - 0.1 K/uL  Lipid panel     Status: Abnormal   Collection Time: 09/27/20  8:48 AM  Result Value Ref Range   Cholesterol 181 0 - 200 mg/dL    Comment: ATP III Classification       Desirable:  < 200 mg/dL               Borderline High:  200 - 239 mg/dL          High:  > = 240 mg/dL   Triglycerides 74.0 0.0 - 149.0 mg/dL    Comment: Normal:  <150 mg/dLBorderline High:  150 - 199 mg/dL   HDL 59.40 >39.00 mg/dL   VLDL 14.8 0.0 - 40.0 mg/dL   LDL Cholesterol 107 (H) 0 - 99 mg/dL   Total CHOL/HDL Ratio 3     Comment:                Men          Women1/2 Average Risk     3.4          3.3Average Risk          5.0  4.42X Average Risk          9.6          7.13X Average Risk          15.0          11.0                       NonHDL 121.35     Comment: NOTE:  Non-HDL goal should be 30 mg/dL higher than patient's LDL goal (i.e. LDL goal of < 70 mg/dL, would have non-HDL goal of < 100 mg/dL)  Comprehensive metabolic panel     Status: None   Collection Time: 09/27/20  8:48 AM  Result Value Ref Range   Sodium 142 135 - 145 mEq/L   Potassium 4.2 3.5 - 5.1 mEq/L   Chloride 107 96 - 112 mEq/L   CO2 30 19 - 32 mEq/L   Glucose, Bld 96 70 - 99 mg/dL   BUN 20 6 - 23 mg/dL   Creatinine, Ser 0.77 0.40 - 1.20 mg/dL   Total Bilirubin 0.8 0.2 - 1.2 mg/dL   Alkaline Phosphatase 79 39 - 117 U/L   AST 18 0 - 37 U/L   ALT 14 0 - 35 U/L   Total Protein 6.5 6.0 - 8.3 g/dL   Albumin 4.0 3.5 - 5.2 g/dL   GFR 77.03 >60.00 mL/min    Comment: Calculated using the CKD-EPI Creatinine Equation (2021)   Calcium 9.4 8.4 - 10.5 mg/dL   Objective  Body mass index is 32.27 kg/m. Wt Readings from Last 3 Encounters:   10/02/20 170 lb 12.8 oz (77.5 kg)  10/01/20 177 lb (80.3 kg)  05/02/20 177 lb 9.6 oz (80.6 kg)   Temp Readings from Last 3 Encounters:  10/02/20 98.2 F (36.8 C) (Oral)  04/04/20 98.2 F (36.8 C) (Oral)  09/30/19 (!) 97.3 F (36.3 C) (Temporal)   BP Readings from Last 3 Encounters:  10/02/20 (!) 144/92  04/04/20 (!) 146/90  09/30/19 132/84   Pulse Readings from Last 3 Encounters:  10/02/20 86  04/04/20 75  09/30/19 60    Physical Exam Vitals and nursing note reviewed.  Constitutional:      Appearance: Normal appearance. She is well-developed and well-groomed. She is obese.  HENT:     Head: Normocephalic and atraumatic.  Cardiovascular:     Rate and Rhythm: Normal rate and regular rhythm.     Heart sounds: Normal heart sounds. No murmur heard.   Pulmonary:     Effort: Pulmonary effort is normal.     Breath sounds: Normal breath sounds.  Abdominal:     Tenderness: There is no abdominal tenderness.  Skin:    General: Skin is warm and dry.  Neurological:     General: No focal deficit present.     Mental Status: She is alert and oriented to person, place, and time. Mental status is at baseline.     Gait: Gait normal.  Psychiatric:        Attention and Perception: Attention and perception normal.        Mood and Affect: Mood and affect normal.        Speech: Speech normal.        Behavior: Behavior normal. Behavior is cooperative.        Thought Content: Thought content normal.        Cognition and Memory: Cognition normal.        Judgment: Judgment normal.  Assessment  Plan  Early satiety - Plan: CT Abdomen Pelvis Wo Contrast, Ambulatory referral to Gastroenterology Epigastric pain - Plan: CT Abdomen Pelvis Wo Contrast, Ambulatory referral to Gastroenterology Hiatal hernia - Plan: CT Abdomen Pelvis Wo Contrast, Ambulatory referral to Gastroenterology Prefers female further w/u I.e EGD if needed   Acute pain of both shoulders - Plan: DG Shoulder Left, DG  Shoulder Right If needed refer Dr. Ral[facility]gh [facility]   Hypertension, unspecified type - Plan: nebivolol (BYSTOLIC) 2.5 MG tablet monitor BP goal <130/<80  Hyperlipidemia, unspecified hyperlipidemia type  Lovastatin 10 mg 3x per week    HM Declines flu shot  Given prevnar rx in the past consider in future  Tdap utd 3/3 moderna had 4th moderna 09/03/2020  Disc shingrixprior appt  Consider hep B vaccine not immune Immune MMR Hep C neg  Colonoscopy 2016 Dr. Juanita Craver need to get records. -EGD/colonoscopy 03/22/2018 Dr. Cordelia Pen hemorrhoids f/u in 10 years and EGD moderate HH, duodenitisomeprazole 40 mg qd x 1 year;no bxs, esophagus dilated 10/03/20 referred leb   mammorepeat 02/17/20 negative ordered   No need paps/p hysterectomy fibroids no h/o abnormal pap  DEXA 06/07/15 normalhad 2020 [facility] ROI today 10/02/20  Never smoker rec healthy diet and exercise  Dr. Olivia Mackie Provider: [facility]

## 2020-10-04 ENCOUNTER — Encounter: Payer: Self-pay | Admitting: Internal Medicine

## 2020-10-05 NOTE — Telephone Encounter (Signed)
See 10/02/20 x-ray imaging result note. Responded to Patient there

## 2020-10-08 DIAGNOSIS — M19012 Primary osteoarthritis, left shoulder: Secondary | ICD-10-CM | POA: Insufficient documentation

## 2020-10-08 DIAGNOSIS — M67912 Unspecified disorder of synovium and tendon, left shoulder: Secondary | ICD-10-CM | POA: Insufficient documentation

## 2020-10-08 DIAGNOSIS — M19011 Primary osteoarthritis, right shoulder: Secondary | ICD-10-CM | POA: Insufficient documentation

## 2020-10-08 NOTE — Addendum Note (Signed)
Addended by: Quentin Ore on: 10/08/2020 12:35 PM   Modules accepted: Orders

## 2020-10-17 DIAGNOSIS — M25511 Pain in right shoulder: Secondary | ICD-10-CM | POA: Diagnosis not present

## 2020-10-17 DIAGNOSIS — M25512 Pain in left shoulder: Secondary | ICD-10-CM | POA: Diagnosis not present

## 2020-10-19 ENCOUNTER — Ambulatory Visit
Admission: RE | Admit: 2020-10-19 | Discharge: 2020-10-19 | Disposition: A | Discharge status: Home / Self Care | Payer: Medicare PPO | Source: Ambulatory Visit | Attending: Internal Medicine | Admitting: Internal Medicine

## 2020-10-19 ENCOUNTER — Other Ambulatory Visit: Payer: Self-pay

## 2020-10-19 DIAGNOSIS — R6881 Early satiety: Secondary | ICD-10-CM | POA: Diagnosis not present

## 2020-10-19 DIAGNOSIS — K449 Diaphragmatic hernia without obstruction or gangrene: Secondary | ICD-10-CM | POA: Diagnosis not present

## 2020-10-19 DIAGNOSIS — R1013 Epigastric pain: Secondary | ICD-10-CM | POA: Insufficient documentation

## 2020-10-19 DIAGNOSIS — K3189 Other diseases of stomach and duodenum: Secondary | ICD-10-CM | POA: Diagnosis not present

## 2020-10-19 DIAGNOSIS — J986 Disorders of diaphragm: Secondary | ICD-10-CM | POA: Diagnosis not present

## 2020-10-25 ENCOUNTER — Other Ambulatory Visit: Payer: Self-pay | Admitting: Internal Medicine

## 2020-10-25 DIAGNOSIS — I10 Essential (primary) hypertension: Secondary | ICD-10-CM

## 2020-10-26 ENCOUNTER — Encounter: Payer: Self-pay | Admitting: Internal Medicine

## 2020-10-26 NOTE — Addendum Note (Signed)
Addended by: Quentin Ore on: 10/26/2020 05:45 PM   Modules accepted: Orders

## 2020-10-30 DIAGNOSIS — K449 Diaphragmatic hernia without obstruction or gangrene: Secondary | ICD-10-CM | POA: Diagnosis not present

## 2020-10-30 DIAGNOSIS — M25511 Pain in right shoulder: Secondary | ICD-10-CM | POA: Diagnosis not present

## 2020-10-30 DIAGNOSIS — I499 Cardiac arrhythmia, unspecified: Secondary | ICD-10-CM | POA: Diagnosis not present

## 2020-10-30 DIAGNOSIS — M25512 Pain in left shoulder: Secondary | ICD-10-CM | POA: Diagnosis not present

## 2020-10-30 DIAGNOSIS — I471 Supraventricular tachycardia: Secondary | ICD-10-CM | POA: Diagnosis not present

## 2020-10-30 DIAGNOSIS — R059 Cough, unspecified: Secondary | ICD-10-CM | POA: Diagnosis not present

## 2020-11-14 DIAGNOSIS — M25511 Pain in right shoulder: Secondary | ICD-10-CM | POA: Diagnosis not present

## 2020-11-14 DIAGNOSIS — M25512 Pain in left shoulder: Secondary | ICD-10-CM | POA: Diagnosis not present

## 2020-11-16 DIAGNOSIS — M25512 Pain in left shoulder: Secondary | ICD-10-CM | POA: Diagnosis not present

## 2020-11-16 DIAGNOSIS — M25511 Pain in right shoulder: Secondary | ICD-10-CM | POA: Diagnosis not present

## 2020-11-21 DIAGNOSIS — M25512 Pain in left shoulder: Secondary | ICD-10-CM | POA: Diagnosis not present

## 2020-11-21 DIAGNOSIS — M25511 Pain in right shoulder: Secondary | ICD-10-CM | POA: Diagnosis not present

## 2020-11-22 ENCOUNTER — Other Ambulatory Visit: Payer: Self-pay | Admitting: Internal Medicine

## 2020-11-22 DIAGNOSIS — I10 Essential (primary) hypertension: Secondary | ICD-10-CM

## 2020-11-23 DIAGNOSIS — M25511 Pain in right shoulder: Secondary | ICD-10-CM | POA: Diagnosis not present

## 2020-11-23 DIAGNOSIS — M25512 Pain in left shoulder: Secondary | ICD-10-CM | POA: Diagnosis not present

## 2020-12-04 DIAGNOSIS — M25511 Pain in right shoulder: Secondary | ICD-10-CM | POA: Diagnosis not present

## 2020-12-04 DIAGNOSIS — M25512 Pain in left shoulder: Secondary | ICD-10-CM | POA: Diagnosis not present

## 2020-12-05 DIAGNOSIS — K219 Gastro-esophageal reflux disease without esophagitis: Secondary | ICD-10-CM | POA: Diagnosis not present

## 2020-12-05 DIAGNOSIS — K449 Diaphragmatic hernia without obstruction or gangrene: Secondary | ICD-10-CM | POA: Diagnosis not present

## 2020-12-06 DIAGNOSIS — R0789 Other chest pain: Secondary | ICD-10-CM | POA: Diagnosis not present

## 2020-12-06 DIAGNOSIS — Z9889 Other specified postprocedural states: Secondary | ICD-10-CM | POA: Diagnosis not present

## 2020-12-06 DIAGNOSIS — I471 Supraventricular tachycardia: Secondary | ICD-10-CM | POA: Diagnosis not present

## 2020-12-06 DIAGNOSIS — R002 Palpitations: Secondary | ICD-10-CM | POA: Diagnosis not present

## 2020-12-06 DIAGNOSIS — Z8679 Personal history of other diseases of the circulatory system: Secondary | ICD-10-CM | POA: Diagnosis not present

## 2020-12-06 DIAGNOSIS — K219 Gastro-esophageal reflux disease without esophagitis: Secondary | ICD-10-CM | POA: Diagnosis not present

## 2020-12-06 DIAGNOSIS — E782 Mixed hyperlipidemia: Secondary | ICD-10-CM | POA: Diagnosis not present

## 2020-12-06 DIAGNOSIS — E669 Obesity, unspecified: Secondary | ICD-10-CM | POA: Diagnosis not present

## 2020-12-12 DIAGNOSIS — M25512 Pain in left shoulder: Secondary | ICD-10-CM | POA: Diagnosis not present

## 2020-12-12 DIAGNOSIS — M25511 Pain in right shoulder: Secondary | ICD-10-CM | POA: Diagnosis not present

## 2020-12-13 DIAGNOSIS — M25512 Pain in left shoulder: Secondary | ICD-10-CM | POA: Diagnosis not present

## 2020-12-13 DIAGNOSIS — M25511 Pain in right shoulder: Secondary | ICD-10-CM | POA: Diagnosis not present

## 2020-12-14 ENCOUNTER — Other Ambulatory Visit: Payer: Self-pay | Admitting: Internal Medicine

## 2020-12-14 DIAGNOSIS — I10 Essential (primary) hypertension: Secondary | ICD-10-CM

## 2021-01-04 ENCOUNTER — Encounter: Payer: Self-pay | Admitting: Internal Medicine

## 2021-01-10 ENCOUNTER — Other Ambulatory Visit: Payer: Self-pay | Admitting: Internal Medicine

## 2021-01-10 DIAGNOSIS — I10 Essential (primary) hypertension: Secondary | ICD-10-CM

## 2021-02-18 ENCOUNTER — Other Ambulatory Visit: Payer: Self-pay

## 2021-02-18 ENCOUNTER — Ambulatory Visit
Admission: RE | Admit: 2021-02-18 | Discharge: 2021-02-18 | Disposition: A | Discharge status: Home / Self Care | Payer: Medicare PPO | Source: Ambulatory Visit | Attending: Internal Medicine | Admitting: Internal Medicine

## 2021-02-18 DIAGNOSIS — Z1231 Encounter for screening mammogram for malignant neoplasm of breast: Secondary | ICD-10-CM | POA: Insufficient documentation

## 2021-03-12 ENCOUNTER — Telehealth: Payer: Self-pay | Admitting: Internal Medicine

## 2021-03-12 NOTE — Telephone Encounter (Signed)
Patient is calling in to see if she will need to have a lab appointment before her 11/17 appointment.Please advise.

## 2021-03-13 NOTE — Telephone Encounter (Signed)
Last labs done 09/2020. Patient needing labs done? Would you like for me to place the same orders for that date?

## 2021-03-18 ENCOUNTER — Other Ambulatory Visit: Payer: Self-pay | Admitting: Internal Medicine

## 2021-03-18 DIAGNOSIS — D72819 Decreased white blood cell count, unspecified: Secondary | ICD-10-CM

## 2021-03-18 DIAGNOSIS — R030 Elevated blood-pressure reading, without diagnosis of hypertension: Secondary | ICD-10-CM

## 2021-03-18 DIAGNOSIS — E538 Deficiency of other specified B group vitamins: Secondary | ICD-10-CM

## 2021-03-18 DIAGNOSIS — Z1329 Encounter for screening for other suspected endocrine disorder: Secondary | ICD-10-CM

## 2021-03-18 DIAGNOSIS — E785 Hyperlipidemia, unspecified: Secondary | ICD-10-CM

## 2021-03-18 NOTE — Telephone Encounter (Signed)
Called and scheduled Patient for 11/11 to have fasting labs done. Patient verbalized understanding

## 2021-03-18 NOTE — Telephone Encounter (Signed)
Labs in sch fasting lab appt thanks before 04/04/21 appt with me thanks

## 2021-03-29 ENCOUNTER — Other Ambulatory Visit: Payer: Self-pay

## 2021-03-29 ENCOUNTER — Other Ambulatory Visit (INDEPENDENT_AMBULATORY_CARE_PROVIDER_SITE_OTHER): Payer: Medicare PPO

## 2021-03-29 DIAGNOSIS — R030 Elevated blood-pressure reading, without diagnosis of hypertension: Secondary | ICD-10-CM

## 2021-03-29 DIAGNOSIS — D72819 Decreased white blood cell count, unspecified: Secondary | ICD-10-CM

## 2021-03-29 DIAGNOSIS — E538 Deficiency of other specified B group vitamins: Secondary | ICD-10-CM

## 2021-03-29 DIAGNOSIS — Z1329 Encounter for screening for other suspected endocrine disorder: Secondary | ICD-10-CM | POA: Diagnosis not present

## 2021-03-29 DIAGNOSIS — E785 Hyperlipidemia, unspecified: Secondary | ICD-10-CM | POA: Diagnosis not present

## 2021-03-29 LAB — CBC WITH DIFFERENTIAL/PLATELET
Basophils Absolute: 0.1 10*3/uL (ref 0.0–0.1)
Basophils Relative: 2.5 % (ref 0.0–3.0)
Eosinophils Absolute: 0.1 10*3/uL (ref 0.0–0.7)
Eosinophils Relative: 3.4 % (ref 0.0–5.0)
HCT: 42.9 % (ref 36.0–46.0)
Hemoglobin: 14.3 g/dL (ref 12.0–15.0)
Lymphocytes Relative: 38.7 % (ref 12.0–46.0)
Lymphs Abs: 1.3 10*3/uL (ref 0.7–4.0)
MCHC: 33.2 g/dL (ref 30.0–36.0)
MCV: 93.3 fl (ref 78.0–100.0)
Monocytes Absolute: 0.4 10*3/uL (ref 0.1–1.0)
Monocytes Relative: 12.1 % — ABNORMAL HIGH (ref 3.0–12.0)
Neutro Abs: 1.4 10*3/uL (ref 1.4–7.7)
Neutrophils Relative %: 43.3 % (ref 43.0–77.0)
Platelets: 198 10*3/uL (ref 150.0–400.0)
RBC: 4.6 Mil/uL (ref 3.87–5.11)
RDW: 13.4 % (ref 11.5–15.5)
WBC: 3.3 10*3/uL — ABNORMAL LOW (ref 4.0–10.5)

## 2021-03-29 LAB — COMPREHENSIVE METABOLIC PANEL
ALT: 13 U/L (ref 0–35)
AST: 16 U/L (ref 0–37)
Albumin: 4.1 g/dL (ref 3.5–5.2)
Alkaline Phosphatase: 70 U/L (ref 39–117)
BUN: 22 mg/dL (ref 6–23)
CO2: 30 mEq/L (ref 19–32)
Calcium: 9.4 mg/dL (ref 8.4–10.5)
Chloride: 105 mEq/L (ref 96–112)
Creatinine, Ser: 0.78 mg/dL (ref 0.40–1.20)
GFR: 75.58 mL/min (ref >60.00)
Glucose, Bld: 86 mg/dL (ref 70–99)
Potassium: 4.2 mEq/L (ref 3.5–5.1)
Sodium: 142 mEq/L (ref 135–145)
Total Bilirubin: 0.8 mg/dL (ref 0.2–1.2)
Total Protein: 6.3 g/dL (ref 6.0–8.3)

## 2021-03-29 LAB — LIPID PANEL
Cholesterol: 191 mg/dL (ref 0–200)
HDL: 60.9 mg/dL (ref >39.00)
LDL Cholesterol: 116 mg/dL — ABNORMAL HIGH (ref 0–99)
NonHDL: 129.84
Total CHOL/HDL Ratio: 3
Triglycerides: 69 mg/dL (ref 0.0–149.0)
VLDL: 13.8 mg/dL (ref 0.0–40.0)

## 2021-03-29 LAB — VITAMIN B12: Vitamin B-12: 445 pg/mL (ref 211–911)

## 2021-03-29 LAB — TSH: TSH: 2.4 u[IU]/mL (ref 0.35–5.50)

## 2021-04-03 ENCOUNTER — Other Ambulatory Visit: Payer: Self-pay | Admitting: Internal Medicine

## 2021-04-03 DIAGNOSIS — E785 Hyperlipidemia, unspecified: Secondary | ICD-10-CM

## 2021-04-03 MED ORDER — LOVASTATIN 10 MG PO TABS
10.0000 mg | ORAL_TABLET | ORAL | 3 refills | Status: DC
Start: 2021-04-04 — End: 2021-06-25

## 2021-04-04 ENCOUNTER — Encounter: Payer: Self-pay | Admitting: Internal Medicine

## 2021-04-04 ENCOUNTER — Other Ambulatory Visit: Payer: Self-pay

## 2021-04-04 ENCOUNTER — Ambulatory Visit: Payer: Medicare PPO | Admitting: Internal Medicine

## 2021-04-04 VITALS — BP 130/70 | HR 67 | Temp 98.3°F | Ht 61.0 in | Wt 166.0 lb

## 2021-04-04 DIAGNOSIS — K219 Gastro-esophageal reflux disease without esophagitis: Secondary | ICD-10-CM

## 2021-04-04 DIAGNOSIS — Z1283 Encounter for screening for malignant neoplasm of skin: Secondary | ICD-10-CM | POA: Diagnosis not present

## 2021-04-04 DIAGNOSIS — R7303 Prediabetes: Secondary | ICD-10-CM | POA: Diagnosis not present

## 2021-04-04 DIAGNOSIS — K449 Diaphragmatic hernia without obstruction or gangrene: Secondary | ICD-10-CM | POA: Diagnosis not present

## 2021-04-04 DIAGNOSIS — Z0000 Encounter for general adult medical examination without abnormal findings: Secondary | ICD-10-CM | POA: Diagnosis not present

## 2021-04-04 DIAGNOSIS — I10 Essential (primary) hypertension: Secondary | ICD-10-CM | POA: Diagnosis not present

## 2021-04-04 DIAGNOSIS — Z1231 Encounter for screening mammogram for malignant neoplasm of breast: Secondary | ICD-10-CM

## 2021-04-04 DIAGNOSIS — E785 Hyperlipidemia, unspecified: Secondary | ICD-10-CM | POA: Diagnosis not present

## 2021-04-04 DIAGNOSIS — Z1329 Encounter for screening for other suspected endocrine disorder: Secondary | ICD-10-CM

## 2021-04-04 DIAGNOSIS — L820 Inflamed seborrheic keratosis: Secondary | ICD-10-CM | POA: Diagnosis not present

## 2021-04-04 DIAGNOSIS — Z1389 Encounter for screening for other disorder: Secondary | ICD-10-CM

## 2021-04-04 LAB — POCT GLYCOSYLATED HEMOGLOBIN (HGB A1C): Hemoglobin A1C: 5.6 % (ref 4.0–5.6)

## 2021-04-04 MED ORDER — NEBIVOLOL HCL 2.5 MG PO TABS
2.5000 mg | ORAL_TABLET | Freq: Every day | ORAL | 3 refills | Status: DC
Start: 2021-04-04 — End: 2022-02-04

## 2021-04-04 MED ORDER — FAMOTIDINE 20 MG PO TABS: 20.0000 mg | ORAL_TABLET | Freq: Every day | ORAL | 3 refills | Status: DC

## 2021-04-04 NOTE — Progress Notes (Signed)
Chief Complaint  Patient presents with   Follow-up    6 mo/ mark on abdomen    Annual 1. Prediabetes check a1c today  2. Hld will increase mevacor 10 3x/week to 4x per week healthy diet and exercise  3. BP improved on bystolic 2.5 mg qd and doing well no fatigue 4. Virtual appt 11/2020 [facility] large hiatal hernia given names surgeons [facility] or [place] [facility] if wants in the future repair    Review of Systems  Constitutional:  Negative for weight loss.  HENT:  Negative for hearing loss.   Eyes:  Negative for blurred vision.  Respiratory:  Negative for shortness of breath.   Cardiovascular:  Negative for chest pain.  Gastrointestinal:  Negative for abdominal pain and blood in stool.  Genitourinary:  Negative for dysuria.  Musculoskeletal:  Negative for falls and joint pain.  Skin:  Negative for rash.  Neurological:  Negative for headaches.  Psychiatric/Behavioral:  Negative for depression.   Past Medical History:  Diagnosis Date   Arthritis    Atypical chest pain    a. 04/2015 Myoview: EF 78%, breast attenuation, no ischemia-->Low risk.   Back pain    scoliosis   Chicken pox    Dysrhythmia    hx palpitations   GERD (gastroesophageal reflux disease)    History of hiatal hernia    noted on cxr   Hyperlipidemia    Joint pain    Obesity, unspecified    Paroxysmal SVT (supraventricular tachycardia) ([place])    a. 2013 Holter: PACs/PVCs; b. 10/2011 Ehco: EF nl, no rwma, mild LVH, mild TR, PASP 42mHg; c 04/2015 SVT in ED->resolved with adenosine; c.    Pneumonia 2010   hx of   Scoliosis    multiple areas of back follows with Dr. KBoston Servicechiropractor    Past Surgical History:  Procedure Laterality Date   ABDOMINAL HYSTERECTOMY     1990 ovaries intact. had 2/2 fibroids last pap 2004 neg.  ? if cervix present    BACK SURGERY  1960's   d/t scoliosis-1964/65?   BACK SURGERY     x 3    BREAST BIOPSY Left 2008   CORE W/CLIP - NEG   BREAST BIOPSY     2000    BREAST EXCISIONAL BIOPSY  [place]rs [place]  exc[place] breast mass   COLONOSCOPY  2007   Dr. EVira Agar  COLONOSCOPY WITH ESOPHAGOGASTRODUODENOSCOPY (EGD) AND ESOPHAGEAL DILATION (ED)     COLONOSCOPY WITH PROPOFOL N/A 03/22/2018   Procedure: COLONOSCOPY WITH PROPOFOL;  Surgeon: EManya Silvas MD;  [facility];  Service: Endoscopy;  Laterality: N/A;   ESOPHAGOGASTRODUODENOSCOPY (EGD) WITH PROPOFOL N/A 03/22/2018   Procedure: ESOPHAGOGASTRODUODENOSCOPY (EGD) WITH PROPOFOL;  Surgeon: EManya Silvas MD;  [facility];  Service: Endoscopy;  Laterality: N/A;   EYE SURGERY  2007   cataracts   FOOT SURGERY  1960's   JOINT REPLACEMENT     total hip left with metal hardward 2016 Dr. RJudd Lienortho    OTHER SURGICAL HISTORY  2004   Bilateral cataracts   PARTIAL HYSTERECTOMY  1990   REVISION TOTAL HIP ARTHROPLASTY Left 04/17/2014   DR RMayer Camel  TOTAL HIP ARTHROPLASTY  2006   TOTAL HIP REVISION Left 04/17/2014   Procedure: LEFT TOTAL HIP REVISION;  Surgeon: FKerin Salen MD;  [place]  Service: Orthopedics;  Laterality: Left;   TOTAL KNEE ARTHROPLASTY Right 10/30/2017   Procedure:  RIGHT TOTAL KNEE ARTHROPLASTY;  Surgeon: Frederik Pear, MD;  Location: [place];  Service: Orthopedics;  Laterality: Right;   Family History  Problem Relation Age of Onset   Heart attack Father    Heart disease Father        MI   Diabetes Father    Hypertension Father    Early death Father    Hyperlipidemia Father    Cancer Mother        breast cancer   Breast cancer Mother 15   Arthritis Mother    Dementia Mother    Varicose Veins Mother    Aneurysm Brother        cardiac    Early death Brother    Colon cancer Cousin    Social History   Socioeconomic History   Marital status: Married    Spouse name: Not on file   Number of children: Not on file   Years of education: Not on file   Highest education level: Not on file  Occupational History   Not on file  Tobacco  Use   Smoking status: Never   Smokeless tobacco: Never  Vaping Use   Vaping Use: Never used  Substance and Sexual Activity   Alcohol use: Not Currently    Comment: rare   Drug use: No   Sexual activity: Yes    Birth control/protection: Surgical  Other Topics Concern   Not on file  Social History Narrative   Married moved from [place]   No kids    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    Social Determinants of Radio broadcast assistant Strain: Low Risk    Difficulty of Paying Living Expenses: Not hard at all  Food Insecurity: No Food Insecurity   Worried About Charity fundraiser in the Last Year: Never true   Arboriculturist in the Last Year: Never true  Transportation Needs: No Transportation Needs   Lack of Transportation (Medical): No   Lack of Transportation (Non-Medical): No  Physical Activity: Sufficiently Active   Days of Exercise per Week: 5 days   Minutes of Exercise per Session: 30 min  Stress: No Stress Concern Present   Feeling of Stress : Not at all  Social Connections: Unknown   Frequency of Communication with Friends and Family: More than three times a week   Frequency of Social Gatherings with Friends and Family: More than three times a week   Attends Religious Services: Not on Electrical engineer or Organizations: Not on file   Attends Archivist Meetings: Not on file   Marital Status: Not on file  Intimate Partner Violence: Not At Risk   Fear of Current or Ex-Partner: No   Emotionally Abused: No   Physically Abused: No   Sexually Abused: No   Current Meds  Medication Sig   acetaminophen (TYLENOL) 325 MG tablet    aspirin EC 81 MG tablet Take 81 mg by mouth daily.   lovastatin (MEVACOR) 10 MG tablet Take 1 tablet (10 mg total) by mouth 4 (four) times a week. At night after 6 PM   meloxicam (MOBIC) 15 MG tablet Take 15 mg by mouth daily as needed.    [DISCONTINUED] famotidine (PEPCID) 20 MG tablet  Take 20 mg by mouth 2 (two) times daily as needed for heartburn or indigestion.   [DISCONTINUED] nebivolol (BYSTOLIC) 2.5 MG tablet TAKE 1 TABLET BY MOUTH EVERY DAY  Allergies  Allergen Reactions   Metoprolol Tartrate     Sob with exertion, ? Heart pounding, fatigue and heart pounding even with 12.5 mg bid dosing     Pravastatin     lethargy and fatigue.  After about 4 weeks, I also started feeling "heavy" heart beating and generally not feeling well.   Diltiazem Rash    CD formulation likely generalized drug rash/eruption face, trunk, arms    Metoprolol Succinate [Metoprolol] Other (See Comments)    Succinate version causes extreme lethargy    Tape Itching, Rash and Other (See Comments)    Reaction: blisters (adhesive tape) Paper tape is ok   Recent Results (from the past 2160 hour(s))  TSH     Status: None   Collection Time: 03/29/21  9:31 AM  Result Value Ref Range   TSH 2.40 0.35 - 5.50 uIU/mL  Vitamin B12     Status: None   Collection Time: 03/29/21  9:31 AM  Result Value Ref Range   Vitamin B-12 445 211 - 911 pg/mL  CBC with Differential/Platelet     Status: Abnormal   Collection Time: 03/29/21  9:31 AM  Result Value Ref Range   WBC 3.3 (L) 4.0 - 10.5 K/uL   RBC 4.60 3.87 - 5.11 Mil/uL   Hemoglobin 14.3 12.0 - 15.0 g/dL   HCT 42.9 36.0 - 46.0 %   MCV 93.3 78.0 - 100.0 fl   MCHC 33.2 30.0 - 36.0 g/dL   RDW 13.4 11.5 - 15.5 %   Platelets 198.0 150.0 - 400.0 K/uL   Neutrophils Relative % 43.3 43.0 - 77.0 %   Lymphocytes Relative 38.7 12.0 - 46.0 %   Monocytes Relative 12.1 (H) 3.0 - 12.0 %   Eosinophils Relative 3.4 0.0 - 5.0 %   Basophils Relative 2.5 0.0 - 3.0 %   Neutro Abs 1.4 1.4 - 7.7 K/uL   Lymphs Abs 1.3 0.7 - 4.0 K/uL   Monocytes Absolute 0.4 0.1 - 1.0 K/uL   Eosinophils Absolute 0.1 0.0 - 0.7 K/uL   Basophils Absolute 0.1 0.0 - 0.1 K/uL  Lipid panel     Status: Abnormal   Collection Time: 03/29/21  9:31 AM  Result Value Ref Range   Cholesterol 191 0 -  200 mg/dL    Comment: ATP III Classification       Desirable:  < 200 mg/dL               Borderline High:  200 - 239 mg/dL          High:  > = 240 mg/dL   Triglycerides 69.0 0.0 - 149.0 mg/dL    Comment: Normal:  <150 mg/dLBorderline High:  150 - 199 mg/dL   HDL 60.90 >39.00 mg/dL   VLDL 13.8 0.0 - 40.0 mg/dL   LDL Cholesterol 116 (H) 0 - 99 mg/dL   Total CHOL/HDL Ratio 3     Comment:                Men          Women1/2 Average Risk     3.4          3.3Average Risk          5.0          4.42X Average Risk          9.6          7.13X Average Risk          15.0  11.0                       NonHDL 129.84     Comment: NOTE:  Non-HDL goal should be 30 mg/dL higher than patient's LDL goal (i.e. LDL goal of < 70 mg/dL, would have non-HDL goal of < 100 mg/dL)  Comprehensive metabolic panel     Status: None   Collection Time: 03/29/21  9:31 AM  Result Value Ref Range   Sodium 142 135 - 145 mEq/L   Potassium 4.2 3.5 - 5.1 mEq/L   Chloride 105 96 - 112 mEq/L   CO2 30 19 - 32 mEq/L   Glucose, Bld 86 70 - 99 mg/dL   BUN 22 6 - 23 mg/dL   Creatinine, Ser 0.78 0.40 - 1.20 mg/dL   Total Bilirubin 0.8 0.2 - 1.2 mg/dL   Alkaline Phosphatase 70 39 - 117 U/L   AST 16 0 - 37 U/L   ALT 13 0 - 35 U/L   Total Protein 6.3 6.0 - 8.3 g/dL   Albumin 4.1 3.5 - 5.2 g/dL   GFR 75.58 >60.00 mL/min    Comment: Calculated using the CKD-EPI Creatinine Equation (2021)   Calcium 9.4 8.4 - 10.5 mg/dL   Objective  Body mass index is 31.37 kg/m. Wt Readings from Last 3 Encounters:  04/04/21 166 lb (75.3 kg)  10/02/20 170 lb 12.8 oz (77.5 kg)  10/01/20 177 lb (80.3 kg)   Temp Readings from Last 3 Encounters:  04/04/21 98.3 F (36.8 C) (Oral)  10/02/20 98.2 F (36.8 C) (Oral)  04/04/20 98.2 F (36.8 C) (Oral)   BP Readings from Last 3 Encounters:  04/04/21 130/70  10/02/20 (!) 144/92  04/04/20 (!) 146/90   Pulse Readings from Last 3 Encounters:  04/04/21 67  10/02/20 86  04/04/20 75     Physical Exam Vitals and nursing note reviewed.  Constitutional:      Appearance: Normal appearance. She is well-developed and well-groomed.  HENT:     Head: Normocephalic and atraumatic.  Eyes:     Conjunctiva/sclera: Conjunctivae normal.     Pupils: Pupils are equal, round, and reactive to light.  Cardiovascular:     Rate and Rhythm: Normal rate and regular rhythm.     Heart sounds: Normal heart sounds. No murmur heard. Pulmonary:     Effort: Pulmonary effort is normal.     Breath sounds: Normal breath sounds.  Abdominal:     General: Abdomen is flat. Bowel sounds are normal.     Tenderness: There is no abdominal tenderness.  Musculoskeletal:        General: No tenderness.  Skin:    General: Skin is warm and dry.  Neurological:     General: No focal deficit present.     Mental Status: She is alert and oriented to person, place, and time. Mental status is at baseline.     Cranial Nerves: Cranial nerves 2-12 are intact.     Gait: Gait is intact.  Psychiatric:        Attention and Perception: Attention and perception normal.        Mood and Affect: Mood and affect normal.        Speech: Speech normal.        Behavior: Behavior normal. Behavior is cooperative.        Thought Content: Thought content normal.        Cognition and Memory: Cognition and memory normal.  Judgment: Judgment normal.    Assessment  Plan  Annual physical exam  Seborrheic keratoses, inflamed - Plan: Ambulatory referral to Dermatology Skin cancer screening - Plan: Ambulatory referral to Dermatology  Hypertension,controlled - Plan: nebivolol (BYSTOLIC) 2.5 MG tablet  Hiatal hernia (large)with GERD - Plan: famotidine (PEPCID) 20 MG tablet If repair wanted in future Dr. Ralene Ok or Dr. Johnathan Hausen  [facility]   [facility] Dr. Zachery Dauer, Dr. Bary Castilla  Gastroesophageal reflux disease without esophagitis - Plan: famotidine (PEPCID) 20 MG tablet  Prediabetes -  Plan: POCT glycosylated hemoglobin (Hb A1C)  Hyperlipidemia, unspecified hyperlipidemia type  Lovastatin 10 mg 4x per week    HM Declines flu shot considering  Given prevnar rx in the past consider in future  Tdap utd 4/4 ? If had 5 shots moderna  Disc shingrix prior appt  Consider hep B vaccine not immune  Immune MMR Hep C neg    Colonoscopy 2016 Dr. Juanita Craver need to get records.  -EGD/colonoscopy 03/22/2018 Dr. Tiffany Kocher int hemorrhoids f/u in 10 years and EGD moderate HH, duodenitis omeprazole 40 mg qd x 1 year; no bxs, esophagus dilated  10/03/20 referred leb    mammo repeat 02/18/21 negative    No need pap s/p hysterectomy fibroids no h/o abnormal pap   DEXA 06/07/15 normal had [facility] ROI today 10/02/20   Never smoker  rec healthy diet and exercise   Provider: Dr. Olivia Mackie McLean-Scocuzza-Internal Medicine

## 2021-04-04 NOTE — Patient Instructions (Addendum)
Dr. Axel Filler or Dr. Luretha Murphy  [facility] surgery   [facility] surgery Dr. Tommi Rumps, Dr. Lemar Livings  Consider pneumonia vaccines  Pneumococcal Polysaccharide Vaccine (PPSV23): What You Need to Know 1. Why get vaccinated? Pneumococcal polysaccharide vaccine (PPSV23) can prevent pneumococcal disease. Pneumococcal disease refers to any illness caused by pneumococcal bacteria. These bacteria can cause many types of illnesses, including pneumonia, which is an infection of the lungs. Pneumococcal bacteria are one of the most common causes of pneumonia. Besides pneumonia, pneumococcal bacteria can also cause: Ear infections Sinus infections Meningitis (infection of the tissue covering the brain and spinal cord) Bacteremia (bloodstream infection) Anyone can get pneumococcal disease, but children under 38 years of age, people with certain medical conditions, adults 65 years or older, and cigarette smokers are at the highest risk. Most pneumococcal infections are mild. However, some can result in long-term problems, such as brain damage or hearing loss. Meningitis, bacteremia, and pneumonia caused by pneumococcal disease can be fatal. 2. PPSV23 PPSV23 protects against 23 types of bacteria that cause pneumococcal disease. PPSV23 is recommended for: All adults 65 years or older, Anyone 2 years or older with certain medical conditions that can lead to an increased risk for pneumococcal disease. Most people need only one dose of PPSV23. A second dose of PPSV23, and another type of pneumococcal vaccine called PCV13, are recommended for certain high-risk groups. Your health care provider can give you more information. People 65 years or older should get a dose of PPSV23 even if they have already gotten one or more doses of the vaccine before they turned 45. 3. Talk with your health care provider Tell your vaccine provider if the person getting the vaccine: Has had an allergic reaction after a  previous dose of PPSV23, or has any severe, life-threatening allergies. In some cases, your health care provider may decide to postpone PPSV23 vaccination to a future visit. People with minor illnesses, such as a cold, may be vaccinated. People who are moderately or severely ill should usually wait until they recover before getting PPSV23. Your health care provider can give you more information. 4. Risks of a vaccine reaction Redness or pain where the shot is given, feeling tired, fever, or muscle aches can happen after PPSV23. People sometimes faint after medical procedures, including vaccination. Tell your provider if you feel dizzy or have vision changes or ringing in the ears. As with any medicine, there is a very remote chance of a vaccine causing a severe allergic reaction, other serious injury, or death. 5. What if there is a serious problem? An allergic reaction could occur after the vaccinated person leaves the clinic. If you see signs of a severe allergic reaction (hives, swelling of the face and throat, difficulty breathing, a fast heartbeat, dizziness, or weakness), call 9-1-1 and get the person to the nearest hospital. For other signs that concern you, call your health care provider. Adverse reactions should be reported to the Vaccine Adverse Event Reporting System (VAERS). Your health care provider will usually file this report, or you can do it yourself. Visit the VAERS website at www.vaers.LAgents.no or call 816-850-4315. VAERS is only for reporting reactions, and VAERS staff do not give medical advice. 6. How can I learn more? Ask your health care provider. Call your local or state health department. Contact the Centers for Disease Control and Prevention (CDC): Call 4066189624 (1-800-CDC-INFO) or Visit CDC's website at PicCapture.uy Vaccine Information Statement PPSV23 Vaccine (03/17/2018) This information is not intended to replace advice  given to you by your health  care provider. Make sure you discuss any questions you have with your health care provider. Document Revised: 01/05/2020 Document Reviewed: 01/06/2020 Elsevier Patient Education  [place].  Pneumococcal Conjugate Vaccine (Prevnar 13) Suspension for Injection What is this medication? PNEUMOCOCCAL VACCINE (NEU mo KOK al vak SEEN) is a vaccine used to prevent pneumococcus bacterial infections. These bacteria can cause serious infections like pneumonia, meningitis, and blood infections. This vaccine will lower your chance of getting pneumonia. If you do get pneumonia, it can make your symptoms milder and your illness shorter. This vaccine will not treat an infection and will not cause infection. This vaccine is recommended for infants and young children, adults with certain medical conditions, and adults 40 years or older. This medicine may be used for other purposes; ask your health care provider or pharmacist if you have questions. COMMON BRAND NAME(S): Prevnar, Prevnar 13 What should I tell my care team before I take this medication? They need to know if you have any of these conditions: bleeding problems fever immune system problems an unusual or allergic reaction to pneumococcal vaccine, diphtheria toxoid, other vaccines, latex, other medicines, foods, dyes, or preservatives pregnant or trying to get pregnant breast-feeding How should I use this medication? This vaccine is for injection into a muscle. It is given by a health care professional. A copy of Vaccine Information Statements will be given before each vaccination. Read this sheet carefully each time. The sheet may change frequently. Talk to your pediatrician regarding the use of this medicine in children. While this drug may be prescribed for children as young as 54 weeks old for selected conditions, precautions do apply. Overdosage: If you think you have taken too much of this medicine contact a poison control center or  emergency room at once. NOTE: This medicine is only for you. Do not share this medicine with others. What if I miss a dose? It is important not to miss your dose. Call your doctor or health care professional if you are unable to keep an appointment. What may interact with this medication? medicines for cancer chemotherapy medicines that suppress your immune function steroid medicines like prednisone or cortisone This list may not describe all possible interactions. Give your health care provider a list of all the medicines, herbs, non-prescription drugs, or dietary supplements you use. Also tell them if you smoke, drink alcohol, or use illegal drugs. Some items may interact with your medicine. What should I watch for while using this medication? Mild fever and pain should go away in 3 days or less. Report any unusual symptoms to your doctor or health care professional. What side effects may I notice from receiving this medication? Side effects that you should report to your doctor or health care professional as soon as possible: allergic reactions like skin rash, itching or hives, swelling of the face, lips, or tongue breathing problems confused fast or irregular heartbeat fever over 102 degrees F seizures unusual bleeding or bruising unusual muscle weakness Side effects that usually do not require medical attention (report to your doctor or health care professional if they continue or are bothersome): aches and pains diarrhea fever of 102 degrees F or less headache irritable loss of appetite pain, tender at site where injected trouble sleeping This list may not describe all possible side effects. Call your doctor for medical advice about side effects. You may report side effects to FDA at 1-800-FDA-1088. Where should I keep my medication? This does  not apply. This vaccine is given in a clinic, pharmacy, doctor's office, or other health care setting and will not be stored at  home. NOTE: This sheet is a summary. It may not cover all possible information. If you have questions about this medicine, talk to your doctor, pharmacist, or health care provider.  2022 Elsevier/Gold Standard (2014-02-09 00:00:00)  Hiatal Hernia A hiatal hernia occurs when part of the stomach slides above the muscle that separates the abdomen from the chest (diaphragm). A person can be born with a hiatal hernia (congenital), or it may develop over time. In almost all cases of hiatal hernia, only the top part of the stomach pushes through the diaphragm. Many people have a hiatal hernia with no symptoms. The larger the hernia, the more likely it is that you will have symptoms. In some cases, a hiatal hernia allows stomach acid to flow back into the tube that carries food from your mouth to your stomach (esophagus). This may cause heartburn symptoms. Severe heartburn symptoms may mean that you have developed a condition called gastroesophageal reflux disease (GERD). What are the causes? This condition is caused by a weakness in the opening (hiatus) where the esophagus passes through the diaphragm to attach to the upper part of the stomach. A person may be born with a weakness in the hiatus, or a weakness can develop over time. What increases the risk? This condition is more likely to develop in: Older people. Age is a major risk factor for a hiatal hernia, especially if you are over the age of 57. Pregnant women. People who are overweight. People who have frequent constipation. What are the signs or symptoms? Symptoms of this condition usually develop in the form of GERD symptoms. Symptoms include: Heartburn. Belching. Indigestion. Trouble swallowing. Coughing or wheezing. Sore throat. Hoarseness. Chest pain. Nausea and vomiting. How is this diagnosed? This condition may be diagnosed during testing for GERD. Tests that may be done include: X-rays of your stomach or chest. An upper  gastrointestinal (GI) series. This is an X-ray exam of your GI tract that is taken after you swallow a chalky liquid that shows up clearly on the X-ray. Endoscopy. This is a procedure to look into your stomach using a thin, flexible tube that has a tiny camera and light on the end of it. How is this treated? This condition may be treated by: Dietary and lifestyle changes to help reduce GERD symptoms. Medicines. These may include: Over-the-counter antacids. Medicines that make your stomach empty more quickly. Medicines that block the production of stomach acid (H2 blockers). Stronger medicines to reduce stomach acid (proton pump inhibitors). Surgery to repair the hernia, if other treatments are not helping. If you have no symptoms, you may not need treatment. Follow these instructions at home: Lifestyle and activity Do not use any products that contain nicotine or tobacco, such as cigarettes and e-cigarettes. If you need help quitting, ask your health care provider. Try to achieve and maintain a healthy body weight. Avoid putting pressure on your abdomen. Anything that puts pressure on your abdomen increases the amount of acid that may be pushed up into your esophagus. Avoid bending over, especially after eating. Raise the head of your bed by putting blocks under the legs. This keeps your head and esophagus higher than your stomach. Do not wear tight clothing around your chest or stomach. Try not to strain when having a bowel movement, when urinating, or when lifting heavy objects. Eating and drinking Avoid foods that  can worsen GERD symptoms. These may include: Fatty foods, like fried foods. Citrus fruits, like oranges or lemon. Other foods and drinks that contain acid, like orange juice or tomatoes. Spicy food. Chocolate. Eat frequent small meals instead of three large meals a day. This helps prevent your stomach from getting too full. Eat slowly. Do not lie down right after  eating. Do not eat 1-2 hours before bed. Do not drink beverages with caffeine. These include cola, coffee, cocoa, and tea. Do not drink alcohol. General instructions Take over-the-counter and prescription medicines only as told by your health care provider. Keep all follow-up visits as told by your health care provider. This is important. Contact a health care provider if: Your symptoms are not controlled with medicines or lifestyle changes. You are having trouble swallowing. You have coughing or wheezing that will not go away. Get help right away if: Your pain is getting worse. Your pain spreads to your arms, neck, jaw, teeth, or back. You have shortness of breath. You sweat for no reason. You feel sick to your stomach (nauseous) or you vomit. You vomit blood. You have bright red blood in your stools. You have black, tarry stools. Summary A hiatal hernia occurs when part of the stomach slides above the muscle that separates the abdomen from the chest (diaphragm). A person may be born with a weakness in the hiatus, or a weakness can develop over time. Symptoms of hiatal hernia may include heartburn, trouble swallowing, or sore throat. Management of hiatal hernia includes eating frequent small meals instead of three large meals a day. Get help right away if you vomit blood, have bright red blood in your stools, or have black, tarry stools. This information is not intended to replace advice given to you by your health care provider. Make sure you discuss any questions you have with your health care provider. Document Revised: 04/05/2020 Document Reviewed: 04/05/2020 Elsevier Patient Education  2022 [place].   Seborrheic Keratosis A seborrheic keratosis is a common, noncancerous (benign) skin growth. These growths are velvety, waxy, rough, tan, brown, or black spots that appear on the skin. These skin growths can be flat or raised, and scaly. What are the causes? The cause of  this condition is not known. What increases the risk? You are more likely to develop this condition if you: Have a family history of seborrheic keratosis. Are 50 or older. Are pregnant. Have had estrogen replacement therapy. What are the signs or symptoms? Symptoms of this condition include growths on the face, chest, shoulders, back, or other areas. These growths: Are usually painless, but may become irritated and itchy. Can be yellow, brown, black, or other colors. Are slightly raised or have a flat surface. Are sometimes rough or wart-like in texture. Are often velvety or waxy on the surface. Are round or oval-shaped. Often occur in groups, but may occur as a single growth. How is this diagnosed? This condition is diagnosed with a medical history and physical exam. A sample of the growth may be tested (skin biopsy). You may need to see a skin specialist (dermatologist). How is this treated? Treatment is not usually needed for this condition, unless the growths are irritated or bleed often. You may also choose to have the growths removed if you do not like their appearance. Most commonly, these growths are treated with a procedure in which liquid nitrogen is applied to "freeze" off the growth (cryosurgery). They may also be burned off with electricity (electrocautery) or removed by  scraping (curettage). Follow these instructions at home: Watch your growth for any changes. Keep all follow-up visits as told by your health care provider. This is important. Do not scratch or pick at the growth or growths. This can cause them to become irritated or infected. Contact a health care provider if: You suddenly have many new growths. Your growth bleeds, itches, or hurts. Your growth suddenly becomes larger or changes color. Summary A seborrheic keratosis is a common, noncancerous (benign) skin growth. Treatment is not usually needed for this condition, unless the growths are irritated or  bleed often. Watch your growth for any changes. Contact a health care provider if you suddenly have many new growths or your growth suddenly becomes larger or changes color. Keep all follow-up visits as told by your health care provider. This is important. This information is not intended to replace advice given to you by your health care provider. Make sure you discuss any questions you have with your health care provider. Document Revised: 09/17/2017 Document Reviewed: 09/17/2017 Elsevier Patient Education  2022 [employer].

## 2021-04-23 DIAGNOSIS — L298 Other pruritus: Secondary | ICD-10-CM | POA: Diagnosis not present

## 2021-04-23 DIAGNOSIS — L821 Other seborrheic keratosis: Secondary | ICD-10-CM | POA: Diagnosis not present

## 2021-04-23 DIAGNOSIS — L578 Other skin changes due to chronic exposure to nonionizing radiation: Secondary | ICD-10-CM | POA: Diagnosis not present

## 2021-04-23 DIAGNOSIS — D2361 Other benign neoplasm of skin of right upper limb, including shoulder: Secondary | ICD-10-CM | POA: Diagnosis not present

## 2021-06-04 ENCOUNTER — Telehealth: Payer: Self-pay | Admitting: Internal Medicine

## 2021-06-04 DIAGNOSIS — G5601 Carpal tunnel syndrome, right upper limb: Secondary | ICD-10-CM | POA: Diagnosis not present

## 2021-06-04 DIAGNOSIS — R200 Anesthesia of skin: Secondary | ICD-10-CM | POA: Diagnosis not present

## 2021-06-04 DIAGNOSIS — R202 Paresthesia of skin: Secondary | ICD-10-CM | POA: Diagnosis not present

## 2021-06-04 NOTE — Telephone Encounter (Signed)
Pt called in stating that she has numbness in her thumb and index finger of her right hand on and off for a month. Pt says it comes after she is laying down and sometimes during the day when she is not laying down. Sent to access nurse

## 2021-06-05 NOTE — Telephone Encounter (Signed)
Left message to return call.  Please schedule Patient with soonest available provider with any office when calling back in. If nothing available Patient will need to be seen by urgent care or the emergency room.

## 2021-06-05 NOTE — Telephone Encounter (Signed)
Ok noted yes will need Nerve studies to diagnose carpal tunnel

## 2021-06-05 NOTE — Telephone Encounter (Signed)
Pt called in stating that she went to Urgent Care yesterday. Pt stated that doctor at Urgent Care stated that Pt might have carpal tunnel in her hands. Pt stated that the urgent Care doctor referred her to ortho. Pt ortho appt is on 06/19/2021. Urgent care advise Pt that she needs to do a nerve conduction study test. Pt stated that Urgent Care gave Pt a brace and 5 days prednisone.

## 2021-06-06 NOTE — Telephone Encounter (Signed)
Noted  

## 2021-06-18 DIAGNOSIS — Z1211 Encounter for screening for malignant neoplasm of colon: Secondary | ICD-10-CM | POA: Insufficient documentation

## 2021-06-19 DIAGNOSIS — M25531 Pain in right wrist: Secondary | ICD-10-CM | POA: Diagnosis not present

## 2021-06-19 DIAGNOSIS — G5601 Carpal tunnel syndrome, right upper limb: Secondary | ICD-10-CM | POA: Diagnosis not present

## 2021-06-25 ENCOUNTER — Other Ambulatory Visit: Payer: Self-pay

## 2021-06-25 ENCOUNTER — Ambulatory Visit: Payer: Medicare PPO | Admitting: Internal Medicine

## 2021-06-25 ENCOUNTER — Encounter: Payer: Self-pay | Admitting: Internal Medicine

## 2021-06-25 VITALS — BP 128/66 | HR 72 | Temp 98.6°F | Ht 62.4 in | Wt 167.2 lb

## 2021-06-25 DIAGNOSIS — K449 Diaphragmatic hernia without obstruction or gangrene: Secondary | ICD-10-CM

## 2021-06-25 DIAGNOSIS — R0602 Shortness of breath: Secondary | ICD-10-CM | POA: Diagnosis not present

## 2021-06-25 DIAGNOSIS — I071 Rheumatic tricuspid insufficiency: Secondary | ICD-10-CM

## 2021-06-25 DIAGNOSIS — E785 Hyperlipidemia, unspecified: Secondary | ICD-10-CM | POA: Diagnosis not present

## 2021-06-25 DIAGNOSIS — I340 Nonrheumatic mitral (valve) insufficiency: Secondary | ICD-10-CM | POA: Diagnosis not present

## 2021-06-25 DIAGNOSIS — F419 Anxiety disorder, unspecified: Secondary | ICD-10-CM | POA: Diagnosis not present

## 2021-06-25 DIAGNOSIS — Z8249 Family history of ischemic heart disease and other diseases of the circulatory system: Secondary | ICD-10-CM | POA: Diagnosis not present

## 2021-06-25 DIAGNOSIS — Z1231 Encounter for screening mammogram for malignant neoplasm of breast: Secondary | ICD-10-CM

## 2021-06-25 DIAGNOSIS — I700 Atherosclerosis of aorta: Secondary | ICD-10-CM | POA: Diagnosis not present

## 2021-06-25 DIAGNOSIS — I10 Essential (primary) hypertension: Secondary | ICD-10-CM

## 2021-06-25 MED ORDER — LOVASTATIN 10 MG PO TABS: 10.0000 mg | ORAL_TABLET | ORAL | 3 refills | Status: DC

## 2021-06-25 NOTE — Progress Notes (Signed)
Chief Complaint  Patient presents with   Medication Refill   F/u  1 discuss meds aspirin if needed, mevacor 10 4x per week and bystolic 2.5 qd  She is having sob at rest and exertion which happened with metoprolol so she is wondering if bystolic 2.5 could cause this. She also is having increased anxiety/panic in the am with no prior h/o anxiety, abnormal dreams which is new and wondering if medication related.    Review of Systems  Constitutional:  Negative for weight loss.  HENT:  Negative for hearing loss.   Eyes:  Negative for blurred vision.  Respiratory:  Positive for shortness of breath.   Cardiovascular:  Negative for chest pain and palpitations.  Gastrointestinal:  Negative for abdominal pain and blood in stool.  Genitourinary:  Negative for dysuria.  Musculoskeletal:  Negative for falls and joint pain.  Skin:  Negative for rash.  Neurological:  Negative for headaches.  Psychiatric/Behavioral:  Negative for depression. The patient is nervous/anxious.   Past Medical History:  Diagnosis Date   Arthritis    Atypical chest pain    a. 04/2015 Myoview: EF 78%, breast attenuation, no ischemia-->Low risk.   Back pain    scoliosis   Chicken pox    Dysrhythmia    hx palpitations   GERD (gastroesophageal reflux disease)    History of hiatal hernia    noted on cxr   Hyperlipidemia    Joint pain    Obesity, unspecified    Paroxysmal SVT (supraventricular tachycardia) ([place])    a. 2013 Holter: PACs/PVCs; b. 10/2011 Ehco: EF nl, no rwma, mild LVH, mild TR, PASP 87mHg; c 04/2015 SVT in ED->resolved with adenosine; c.    Pneumonia 2010   hx of   Scoliosis    multiple areas of back follows with Dr. KBoston Servicechiropractor    Past Surgical History:  Procedure Laterality Date   ABDOMINAL HYSTERECTOMY     1990 ovaries intact. had 2/2 fibroids last pap 2004 neg.  ? if cervix present    BACK SURGERY  1960's   d/t scoliosis-1964/65?   BACK SURGERY     x 3    BREAST BIOPSY Left 2008    CORE W/CLIP - NEG   BREAST BIOPSY     2000    BREAST EXCISIONAL BIOPSY [place]  exc[place] breast mass   CARDIAC ELECTROPHYSIOLOGY STUDY AND ABLATION     10/12/19 for DVT [facility]   COLONOSCOPY  2007   Dr. EVira Agar  COLONOSCOPY WITH ESOPHAGOGASTRODUODENOSCOPY (EGD) AND ESOPHAGEAL DILATION (ED)     COLONOSCOPY WITH PROPOFOL N/A 03/22/2018   Procedure: COLONOSCOPY WITH PROPOFOL;  Surgeon: EManya Silvas MD;  [facility]  Service: Endoscopy;  Laterality: N/A;   ESOPHAGOGASTRODUODENOSCOPY (EGD) WITH PROPOFOL N/A 03/22/2018   Procedure: ESOPHAGOGASTRODUODENOSCOPY (EGD) WITH PROPOFOL;  Surgeon: EManya Silvas MD;  [facility];  Service: Endoscopy;  Laterality: N/A;   EYE SURGERY  2007   cataracts   FOOT SURGERY  1960's   JOINT REPLACEMENT     total hip left with metal hardward 2016 Dr. RJudd Lienortho    OTHER SURGICAL HISTORY  2004   Bilateral cataracts   PARTIAL HYSTERECTOMY  1990   REVISION TOTAL HIP ARTHROPLASTY Left 04/17/2014   DR RMayer Camel  TOTAL HIP ARTHROPLASTY  2006   TOTAL HIP REVISION Left 04/17/2014   Procedure: LEFT TOTAL HIP REVISION;  Surgeon: FKerin Salen MD;  Location: [place];  Service: Orthopedics;  Laterality: Left;   TOTAL KNEE ARTHROPLASTY Right 10/30/2017   Procedure: RIGHT TOTAL KNEE ARTHROPLASTY;  Surgeon: Frederik Pear, MD;  Location: [place];  Service: Orthopedics;  Laterality: Right;   Family History  Problem Relation Age of Onset   Dementia Mother        dx early to mid 3s died 72   Cancer Mother        breast cancer   Breast cancer Mother 45   Arthritis Mother    Varicose Veins Mother    Heart attack Father    Heart disease Father        MI   Diabetes Father    Hypertension Father    Early death Father    Hyperlipidemia Father    Heart disease Brother    Hypertension Brother    Aneurysm Brother        cardiac    Early death Brother    Colon cancer Cousin    Social History    Socioeconomic History   Marital status: Married    Spouse name: Not on file   Number of children: Not on file   Years of education: Not on file   Highest education level: Not on file  Occupational History   Not on file  Tobacco Use   Smoking status: Never   Smokeless tobacco: Never  Vaping Use   Vaping Use: Never used  Substance and Sexual Activity   Alcohol use: Not Currently    Comment: rare   Drug use: No   Sexual activity: Yes    Birth control/protection: Surgical  Other Topics Concern   Not on file  Social History Narrative   Married moved from [place]   No kids    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    Social Determinants of Radio broadcast assistant Strain: Low Risk    Difficulty of Paying Living Expenses: Not hard at all  Food Insecurity: No Food Insecurity   Worried About Charity fundraiser in the Last Year: Never true   Arboriculturist in the Last Year: Never true  Transportation Needs: No Transportation Needs   Lack of Transportation (Medical): No   Lack of Transportation (Non-Medical): No  Physical Activity: Sufficiently Active   Days of Exercise per Week: 5 days   Minutes of Exercise per Session: 30 min  Stress: No Stress Concern Present   Feeling of Stress : Not at all  Social Connections: Unknown   Frequency of Communication with Friends and Family: More than three times a week   Frequency of Social Gatherings with Friends and Family: More than three times a week   Attends Religious Services: Not on Electrical engineer or Organizations: Not on file   Attends Archivist Meetings: Not on file   Marital Status: Not on file  Intimate Partner Violence: Not At Risk   Fear of Current or Ex-Partner: No   Emotionally Abused: No   Physically Abused: No   Sexually Abused: No   Current Meds  Medication Sig   acetaminophen (TYLENOL) 325 MG tablet    aspirin EC 81 MG tablet Take 81 mg by mouth daily.    famotidine (PEPCID) 20 MG tablet Take 1 tablet (20 mg total) by mouth daily.   meloxicam (MOBIC) 15 MG tablet Take 15 mg by mouth daily as needed.    nebivolol (BYSTOLIC) 2.5  MG tablet Take 1 tablet (2.5 mg total) by mouth daily.   [DISCONTINUED] lovastatin (MEVACOR) 10 MG tablet Take 1 tablet (10 mg total) by mouth 4 (four) times a week. At night after 6 PM   Allergies  Allergen Reactions   Metoprolol Tartrate     Sob with exertion, ? Heart pounding, fatigue and heart pounding even with 12.5 mg bid dosing     Pravastatin     lethargy and fatigue.  After about 4 weeks, I also started feeling "heavy" heart beating and generally not feeling well.   Diltiazem Rash    CD formulation likely generalized drug rash/eruption face, trunk, arms    Metoprolol Succinate [Metoprolol] Other (See Comments)    Succinate version causes extreme lethargy    Tape Itching, Rash and Other (See Comments)    Reaction: blisters (adhesive tape) Paper tape is ok   Recent Results (from the past 2160 hour(s))  TSH     Status: None   Collection Time: 03/29/21  9:31 AM  Result Value Ref Range   TSH 2.40 0.35 - 5.50 uIU/mL  Vitamin B12     Status: None   Collection Time: 03/29/21  9:31 AM  Result Value Ref Range   Vitamin B-12 445 211 - 911 pg/mL  CBC with Differential/Platelet     Status: Abnormal   Collection Time: 03/29/21  9:31 AM  Result Value Ref Range   WBC 3.3 (L) 4.0 - 10.5 K/uL   RBC 4.60 3.87 - 5.11 Mil/uL   Hemoglobin 14.3 12.0 - 15.0 g/dL   HCT 42.9 36.0 - 46.0 %   MCV 93.3 78.0 - 100.0 fl   MCHC 33.2 30.0 - 36.0 g/dL   RDW 13.4 11.5 - 15.5 %   Platelets 198.0 150.0 - 400.0 K/uL   Neutrophils Relative % 43.3 43.0 - 77.0 %   Lymphocytes Relative 38.7 12.0 - 46.0 %   Monocytes Relative 12.1 (H) 3.0 - 12.0 %   Eosinophils Relative 3.4 0.0 - 5.0 %   Basophils Relative 2.5 0.0 - 3.0 %   Neutro Abs 1.4 1.4 - 7.7 K/uL   Lymphs Abs 1.3 0.7 - 4.0 K/uL   Monocytes Absolute 0.4 0.1 - 1.0 K/uL    Eosinophils Absolute 0.1 0.0 - 0.7 K/uL   Basophils Absolute 0.1 0.0 - 0.1 K/uL  Lipid panel     Status: Abnormal   Collection Time: 03/29/21  9:31 AM  Result Value Ref Range   Cholesterol 191 0 - 200 mg/dL    Comment: ATP III Classification       Desirable:  < 200 mg/dL               Borderline High:  200 - 239 mg/dL          High:  > = 240 mg/dL   Triglycerides 69.0 0.0 - 149.0 mg/dL    Comment: Normal:  <150 mg/dLBorderline High:  150 - 199 mg/dL   HDL 60.90 >39.00 mg/dL   VLDL 13.8 0.0 - 40.0 mg/dL   LDL Cholesterol 116 (H) 0 - 99 mg/dL   Total CHOL/HDL Ratio 3     Comment:                Men          Women1/2 Average Risk     3.4          3.3Average Risk          5.0  4.42X Average Risk          9.6          7.13X Average Risk          15.0          11.0                       NonHDL 129.84     Comment: NOTE:  Non-HDL goal should be 30 mg/dL higher than patient's LDL goal (i.e. LDL goal of < 70 mg/dL, would have non-HDL goal of < 100 mg/dL)  Comprehensive metabolic panel     Status: None   Collection Time: 03/29/21  9:31 AM  Result Value Ref Range   Sodium 142 135 - 145 mEq/L   Potassium 4.2 3.5 - 5.1 mEq/L   Chloride 105 96 - 112 mEq/L   CO2 30 19 - 32 mEq/L   Glucose, Bld 86 70 - 99 mg/dL   BUN 22 6 - 23 mg/dL   Creatinine, Ser 0.78 0.40 - 1.20 mg/dL   Total Bilirubin 0.8 0.2 - 1.2 mg/dL   Alkaline Phosphatase 70 39 - 117 U/L   AST 16 0 - 37 U/L   ALT 13 0 - 35 U/L   Total Protein 6.3 6.0 - 8.3 g/dL   Albumin 4.1 3.5 - 5.2 g/dL   GFR 75.58 >60.00 mL/min    Comment: Calculated using the CKD-EPI Creatinine Equation (2021)   Calcium 9.4 8.4 - 10.5 mg/dL  POCT glycosylated hemoglobin (Hb A1C)     Status: None   Collection Time: 04/04/21  9:48 AM  Result Value Ref Range   Hemoglobin A1C 5.6 4.0 - 5.6 %   HbA1c POC (<> result, manual entry)     HbA1c, POC (prediabetic range)     HbA1c, POC (controlled diabetic range)     Objective  Body mass index is 30.19  kg/m. Wt Readings from Last 3 Encounters:  06/25/21 167 lb 3.2 oz (75.8 kg)  04/04/21 166 lb (75.3 kg)  10/02/20 170 lb 12.8 oz (77.5 kg)   Temp Readings from Last 3 Encounters:  06/25/21 98.6 F (37 C) (Oral)  04/04/21 98.3 F (36.8 C) (Oral)  10/02/20 98.2 F (36.8 C) (Oral)   BP Readings from Last 3 Encounters:  06/25/21 128/66  04/04/21 130/70  10/02/20 (!) 144/92   Pulse Readings from Last 3 Encounters:  06/25/21 72  04/04/21 67  10/02/20 86    Physical Exam Vitals and nursing note reviewed.  Constitutional:      Appearance: Normal appearance. She is well-developed and well-groomed.  HENT:     Head: Normocephalic and atraumatic.  Eyes:     Conjunctiva/sclera: Conjunctivae normal.     Pupils: Pupils are equal, round, and reactive to light.  Cardiovascular:     Rate and Rhythm: Normal rate and regular rhythm.     Heart sounds: Normal heart sounds. No murmur heard. Pulmonary:     Effort: Pulmonary effort is normal.     Breath sounds: Normal breath sounds.  Abdominal:     General: Abdomen is flat. Bowel sounds are normal.     Tenderness: There is no abdominal tenderness.  Musculoskeletal:        General: No tenderness.  Skin:    General: Skin is warm and dry.  Neurological:     General: No focal deficit present.     Mental Status: She is alert and oriented to person, place, and time. Mental  status is at baseline.     Cranial Nerves: Cranial nerves 2-12 are intact.     Gait: Gait is intact.  Psychiatric:        Attention and Perception: Attention and perception normal.        Mood and Affect: Mood and affect normal.        Speech: Speech normal.        Behavior: Behavior normal. Behavior is cooperative.        Thought Content: Thought content normal.        Cognition and Memory: Cognition and memory normal.        Judgment: Judgment normal.    Assessment  Plan  SOB (shortness of breath) on exertion  ? Etiology multifactorial (I.e large hiatal hernia  shifting left diaphragm, new anxiety unclear etiology, h/o mild MR, mild to mod TR s/p ablation 10/12/19 for SVT, physical deconditioning, scoliosis causing restrictive lung issues less likey statin, bystolic causing these sx's)- Plan: Ambulatory referral to Cardiology Dr. Clayborn Bigness  Consider repeat echo and CT calcium score with FH heart disease dad and brother I do not see record of prior cath from 10/12/19 not sure if was done or if just cath for ablation procedure this day  Consider pfts with pulm in the future to further w/u  She wants to try to back down to mevacor 4x per week to 3x per week  Will continue aspirin 81 mg qd and bystolic 2.5 mg try this in the am instead of qhs   Anxiety Cont to assess at f/u  Aortic atherosclerosis ([place]) - Plan: Ambulatory referral to Cardiology Control htn bystolic 2.5 on mevacor 10 mg reduced 4x per week to 3x per week  Aspirin 81 mg qd  Primary hypertension - Plan: Ambulatory referral to Cardiology BP improved on bystolic 2.5 mg qd  Hyperlipidemia, unspecified hyperlipidemia type - Plan: Ambulatory referral to Cardiology, lovastatin (MEVACOR) 10 MG tablet 3x per week   Hiatal hernia large  -if causing breathing issues consider surgical consult for repair   HM Declines flu shot considering  Given prevnar rx in the past consider in future  Tdap utd 4/4 ? If had 5 shots moderna  Disc shingrix prior appt  Consider hep B vaccine not immune  Immune MMR Hep C neg    Colonoscopy 2016 Dr. Juanita Craver need to get records.  -EGD/colonoscopy 03/22/2018 Dr. Tiffany Kocher int hemorrhoids f/u in 10 years and EGD moderate HH, duodenitis omeprazole 40 mg qd x 1 year; no bxs, esophagus dilated  10/03/20 referred leb    mammo repeat 02/18/21 negative ordered    No need pap s/p hysterectomy fibroids no h/o abnormal pap   DEXA 05/29/20 normal    Never smoker  rec healthy diet and exercise   Provider: Dr. Olivia Mackie McLean-Scocuz[facility]

## 2021-06-25 NOTE — Patient Instructions (Addendum)
Consider repeat echo and CT cardiac calcium score   Vascular/Lymphatic: Minimal aortoiliac calcified plaque without aneurysm. No abdominal or pelvic adenopathy.    Echo 07/29/19  1. Left ventricular ejection fraction, by estimation, is 60 to 65%. The  left ventricle has normal function. The left ventricle has no regional  wall motion abnormalities. Left ventricular diastolic parameters are  consistent with Grade I diastolic  dysfunction (impaired relaxation).   2. Right ventricular systolic function is normal. The right ventricular  size is normal. There is mildly elevated pulmonary artery systolic  pressure. The estimated right ventricular systolic pressure is 34.8 mmHg.   3. Mild mitral valve regurgitation.   4. Tricuspid valve regurgitation is mild to moderate.   Comparison(s): Normal EF with mild LVH, trivial MR, trivial PI, mild TR.    Tricuspid Valve Regurgitation Tricuspid valve regurgitation, also called tricuspid regurgitation, is a heart condition in which some blood leaks back (regurgitates) through the valve that is located between the right atrium and the right ventricle (tricuspid valve). Blood returns to the heart through the right atrium. It normally flows through the tricuspid valve into the right ventricle. If you have tricuspid valve regurgitation, some of the blood in the right ventricle leaks back into your right atrium whenever the ventricle pumps blood to your lungs. This can increase the amount of blood in the right atrium, causing the right atrium to become larger. As a result, pressure can change in the other chambers of your heart and in the surrounding blood vessels. What are the causes? Mild tricuspid valve regurgitation may not have a cause. It may be considered a normal condition. Causes of more severe regurgitation include: Any condition that increases the pressure in your lungs (pulmonary hypertension), such as: Chronic obstructive pulmonary disease  (COPD). Lung disease. Atrial septal defect (ASD). Heart failure. Cardiomyopathy. Heart attack. Other causes may include: Valve injury from X-ray (radiation) treatment, such as for cancer. Being born with a problem in the valve (congenital abnormality). Heart valve infection (endocarditis). Inflammation around joints and other organs (rheumatoid arthritis). A condition that causes inflammation of the heart, blood vessels, or joints (rheumatic fever). A condition that causes growths on the heart valve (carcinoid syndrome). A genetic disorder that causes weak heart tissues (Marfan syndrome). What are the signs or symptoms? Often, there are no symptoms. Severe tricuspid valve regurgitation may cause symptoms that include: Fatigue. Activity intolerance. Cough. Shortness of breath. Swelling of the feet, ankles, legs, or abdomen. Loss of appetite. Abdominal pain from an enlarged liver. Throbbing in the neck. How is this diagnosed? This condition may be diagnosed based on: Your symptoms. Your health care provider may suspect tricuspid valve regurgitation if you are having symptoms of the condition. A physical exam. Your health care provider may do a physical exam to check whether you have an abnormal heart sound (murmur). Tests to confirm the diagnosis. These may include: An electrocardiogram (ECG) to measure the electrical rhythm of the heart. A chest X-ray to see whether your heart is enlarged. An imaging study (echocardiogram) to find out more information about your condition and how severe it is. How is this treated? Mild tricuspid valve regurgitation may not require treatment. If the condition becomes severe or causes symptoms, treatment may include: Being given oxygen. Medicines to help reduce the workload on your heart. Medicines to decrease fluid in the body (diuretics). Surgical treatment depends on the cause of your tricuspid valve regurgitation and may include: Tricuspid  valve repair. This may include reshaping the  ring around the valve (annuloplasty). Tricuspid valve replacement. This is a surgery to replace the tricuspid valve with an artificial (prosthetic) valve. Transcatheter (minimally invasive) clip repair. The valve is repaired by placing a device (clip) onto the diseased valve. The device improves the blood flow through the valve and prevents leakage. Follow these instructions at home: Eating and drinking Eat a heart-healthy diet that includes whole grains, fruits and vegetables, lean proteins, and low-fat or nonfat dairy products. Limit how much salt (sodium) you eat as told by your health care provider. Follow instructions from your health care provider about any other eating or drinking restrictions, such as limiting foods that are high in fat and processed sugars. Use healthy cooking methods, such as roasting, grilling, broiling, baking, poaching, steaming, or stir-frying. Alcohol use Do not drink alcohol if: Your health care provider tells you not to drink. You are pregnant, may be pregnant, or are planning to become pregnant. If you drink alcohol: Limit how much you use to: 0-1 drink a day for women. 0-2 drinks a day for men. Be aware of how much alcohol is in your drink. In the [place] one drink equals one 12 oz bottle of beer (355 mL), one 5 oz glass of wine (148 mL), or one 1 oz glass of hard liquor (44 mL). Activity Return to your normal activities as told by your health care provider. Ask your health care provider what activities are safe for you. Regular exercise is important for the health of your heart and for maintaining a healthy weight. Ask your health care provider what type of exercise is safe for you. General instructions Maintain a healthy weight. Take over-the-counter and prescription medicines only as told by your health care provider. Do not use any products that contain nicotine or tobacco, such as cigarettes, e-cigarettes, and  chewing tobacco. If you need help quitting, ask your health care provider. Keep all follow-up visits as told by your health care provider. This is important. Contact a health care provider if: You have signs of tricuspid valve regurgitation. Get help right away if: You have chest pain or difficulty breathing. These symptoms may represent a serious problem that is an emergency. Do not wait to see if the symptoms will go away. Get medical help right away. Call your local emergency services (911 in the [place] Do not drive yourself to the hospital. Summary Tricuspid valve regurgitation is a heart condition in which some blood leaks back (regurgitates) through the valve that is between your right atrium and right ventricle (tricuspid valve). Mild tricuspid valve regurgitation may not cause symptoms or require treatment. If tricuspid valve regurgitation becomes severe and causes symptoms, treatment may include medicines, valve repair, or valve replacement. Follow your health care provider's instructions on eating and drinking, doing activities, and making lifestyle changes. Get help right away if you have chest pain or difficulty breathing. This information is not intended to replace advice given to you by your health care provider. Make sure you discuss any questions you have with your health care provider. Document Revised: 10/04/2020 Document Reviewed: 02/17/2018 Elsevier Patient Education  2022 [employer]  Mitral Valve Regurgitation Mitral valve regurgitation, also called mitral regurgitation, is a condition in which some blood leaks back (regurgitates) through the mitral valve in the heart. The mitral valve is located between the upper left chamber (left atrium) and the lower left chamber (left ventricle) of the heart. When blood travels through the heart, it goes from the left atrium to the left  ventricle and then out to the body. Normally, the mitral valve opens when the atrium pumps blood  into the ventricle, and it closes when the ventricle pumps blood out to the body. Mitral valve regurgitation happens when the mitral valve does not close properly. As a result, blood in the ventricle leaks back into the atrium. Mitral valve regurgitation causes the heart to work harder to pump blood. If the condition is mild, a person may not have symptoms. However, over time, this can lead to heart failure. What are the causes? This condition may be caused by: A condition in which the mitral valve does not close completely when the heart pumps blood (mitral valve prolapse). Heart valve infection (endocarditis). Certain types of heart disease. A condition that causes inflammation of the heart, blood vessels, or joints (rheumatic fever). Certain conditions that are present at birth (congenital heart defect). Previous radiation therapy to the chest area. Damage to the mitral valve, such as from injury (trauma) to the heart or a heart attack. Certain combinations of weight-loss (anti-obesity) medicines. What are the signs or symptoms? In some cases of mild to moderate mitral regurgitation, there are no symptoms. Symptoms of this condition include: Shortness of breath. Fatigue. Activity intolerance. Cough. Severe symptoms of this condition include: Suddenly waking up at night with difficulty breathing. Fast or irregular heartbeat. Swelling in the lower legs, ankles, and feet. Fluid in the lungs that makes it very hard to breathe (pulmonary edema). How is this diagnosed? This condition may be diagnosed based on: A physical exam. Your health care provider will listen to your heart for an abnormal heart sound (murmur). Tests to confirm the diagnosis. These may include: A test that creates ultrasound images of the heart (echocardiogram). This test allows your health care provider to see how the heart valves work while your heart is beating. Chest X-ray. A test that records the electrical  impulses of the heart (electrocardiogram, or ECG). A test that looks at the structure and function of the heart (cardiac catheterization). How is this treated? This condition may be treated with: Medicines. These may be given to treat symptoms and prevent complications. Surgery or catheter-based procedures to repair or replace the mitral valve. This may be done in severe, long-term (chronic) cases. Follow these instructions at home: Eating and drinking  Eat a heart-healthy diet that includes whole grains, fresh fruits and vegetables, low-fat (lean) proteins, and low-fat or nonfat dairy products. Consider working with a dietitian to help you make healthy food choices. Limit how much salt (sodium) you eat as told by your health care provider. Follow instructions from your health care provider about any other eating or drinking restrictions, such as limiting foods that are high in fat and processed sugars. Use healthy cooking methods, such as roasting, grilling, broiling, baking, poaching, steaming, or stir-frying. Alcohol use Do not drink alcohol if: Your health care provider tells you not to drink. You are pregnant, may be pregnant, or are planning to become pregnant. If you drink alcohol: Limit how much you use to: 0-1 drink a day for women. 0-2 drinks a day for men. Be aware of how much alcohol is in your drink. In the [place] one drink equals one 12 oz bottle of beer (355 mL), one 5 oz glass of wine (148 mL), or one 1 oz glass of hard liquor (44 mL). Activity Return to your normal activities as told by your health care provider. Ask your health care provider what activities are safe for you.  Regular exercise is important for the health of your heart and for maintaining a healthy weight. Ask your health care provider what type of exercise is safe for you. You may need to avoid strenuous exercise. Lifestyle Maintain a healthy weight. Do not use any products that contain nicotine or tobacco,  such as cigarettes, e-cigarettes, and chewing tobacco. If you need help quitting, ask your health care provider. Find ways to manage stress. If you need help with this, ask your health care provider. General instructions Take over-the-counter and prescription medicines only as told by your health care provider. Work closely with your health care provider to manage any other health conditions you have, such as diabetes or high blood pressure. If you plan to become pregnant, talk with your health care provider first. Keep all follow-up visits as told by your health care provider. This is important. Contact a health care provider if you: Have a fever. Feel more tired than usual when doing physical activity. Have a dry cough. Get help right away if you: Have shortness of breath. Develop chest pain. Have swelling in your hands, feet, ankles, or abdomen that is getting worse. Have trouble staying awake or you faint. Feel dizzy or unsteady. Suddenly gain weight. Feel confused. These symptoms may represent a serious problem that is an emergency. Do not wait to see if the symptoms will go away. Get medical help right away. Call your local emergency services (911 in the [place] Do not drive yourself to the hospital. Summary Mitral valve regurgitation, also called mitral regurgitation, is a condition in which some blood leaks back (regurgitates) through the mitral valve in the heart. Depending on how severe your condition is, you may be treated with medicines, catheter-based procedures, or surgery. Practice heart-healthy habits to manage this condition. These include limiting alcohol, avoiding nicotine and tobacco, and eating a heart-healthy diet that is low in salt (sodium). This information is not intended to replace advice given to you by your health care provider. Make sure you discuss any questions you have with your health care provider. Document Revised: 10/04/2020 Document Reviewed:  04/18/2018 Elsevier Patient Education  2022 [employer]  Generalized Anxiety Disorder, Adult Generalized anxiety disorder (GAD) is a mental health condition. Unlike normal worries, anxiety related to GAD is not triggered by a specific event. These worries do not fade or get better with time. GAD interferes with relationships, work, and school. GAD symptoms can vary from mild to severe. People with severe GAD can have intense waves of anxiety with physical symptoms that are similar to panic attacks. What are the causes? The exact cause of GAD is not known, but the following are believed to have an impact: Differences in natural brain chemicals. Genes passed down from parents to children. Differences in the way threats are perceived. Development and stress during childhood. Personality. What increases the risk? The following factors may make you more likely to develop this condition: Being female. Having a family history of anxiety disorders. Being very shy. Experiencing very stressful life events, such as the death of a loved one. Having a very stressful family environment. What are the signs or symptoms? People with GAD often worry excessively about many things in their lives, such as their health and family. Symptoms may also include: Mental and emotional symptoms: Worrying excessively about natural disasters. Fear of being late. Difficulty concentrating. Fears that others are judging your performance. Physical symptoms: Fatigue. Headaches, muscle tension, muscle twitches, trembling, or feeling shaky. Feeling like your heart is  pounding or beating very fast. Feeling out of breath or like you cannot take a deep breath. Having trouble falling asleep or staying asleep, or experiencing restlessness. Sweating. Nausea, diarrhea, or irritable bowel syndrome (IBS). Behavioral symptoms: Experiencing erratic moods or irritability. Avoidance of new situations. Avoidance of  people. Extreme difficulty making decisions. How is this diagnosed? This condition is diagnosed based on your symptoms and medical history. You will also have a physical exam. Your health care provider may perform tests to rule out other possible causes of your symptoms. To be diagnosed with GAD, a person must have anxiety that: Is out of his or her control. Affects several different aspects of his or her life, such as work and relationships. Causes distress that makes him or her unable to take part in normal activities. Includes at least three symptoms of GAD, such as restlessness, fatigue, trouble concentrating, irritability, muscle tension, or sleep problems. Before your health care provider can confirm a diagnosis of GAD, these symptoms must be present more days than they are not, and they must last for 6 months or longer. How is this treated? This condition may be treated with: Medicine. Antidepressant medicine is usually prescribed for long-term daily control. Anti-anxiety medicines may be added in severe cases, especially when panic attacks occur. Talk therapy (psychotherapy). Certain types of talk therapy can be helpful in treating GAD by providing support, education, and guidance. Options include: Cognitive behavioral therapy (CBT). People learn coping skills and self-calming techniques to ease their physical symptoms. They learn to identify unrealistic thoughts and behaviors and to replace them with more appropriate thoughts and behaviors. Acceptance and commitment therapy (ACT). This treatment teaches people how to be mindful as a way to cope with unwanted thoughts and feelings. Biofeedback. This process trains you to manage your body's response (physiological response) through breathing techniques and relaxation methods. You will work with a therapist while machines are used to monitor your physical symptoms. Stress management techniques. These include yoga, meditation, and exercise. A  mental health specialist can help determine which treatment is best for you. Some people see improvement with one type of therapy. However, other people require a combination of therapies. Follow these instructions at home: Lifestyle Maintain a consistent routine and schedule. Anticipate stressful situations. Create a plan and allow extra time to work with your plan. Practice stress management or self-calming techniques that you have learned from your therapist or your health care provider. Exercise regularly and spend time outdoors. Eat a healthy diet that includes plenty of vegetables, fruits, whole grains, low-fat dairy products, and lean protein. Do not eat a lot of foods that are high in fat, added sugar, or salt (sodium). Drink plenty of water. Avoid alcohol. Alcohol can increase anxiety. Avoid caffeine and certain over-the-counter cold medicines. These may make you feel worse. Ask your pharmacist which medicines to avoid. General instructions Take over-the-counter and prescription medicines only as told by your health care provider. Understand that you are likely to have setbacks. Accept this and be kind to yourself as you persist to take better care of yourself. Anticipate stressful situations. Create a plan and allow extra time to work with your plan. Recognize and accept your accomplishments, even if you judge them as small. Spend time with people who care about you. Keep all follow-up visits. This is important. Where to find more information [employer]: http://www.maynard.net/www.nimh.nih.gov Substance Abuse and Mental Health Services: [employer].ptwww.samhsa.gov Contact a health care provider if: Your symptoms do not get better.  Your symptoms get worse. You have signs of depression, such as: A persistently sad or irritable mood. Loss of enjoyment in activities that used to bring you joy. Change in weight or eating. Changes in sleeping habits. Get help right away if: You have thoughts  about hurting yourself or others. If you ever feel like you may hurt yourself or others, or have thoughts about taking your own life, get help right away. Go to your nearest emergency department or: Call your local emergency services (911 in the [place] Call a suicide crisis helpline, such as the National Suicide Prevention Lifeline at (802) 352-1114 or 988 in the [place] This is open 24 hours a day in the [place] Text the Crisis Text Line at (709)133-9526 (in the [place] Summary Generalized anxiety disorder (GAD) is a mental health condition that involves worry that is not triggered by a specific event. People with GAD often worry excessively about many things in their lives, such as their health and family. GAD may cause symptoms such as restlessness, trouble concentrating, sleep problems, frequent sweating, nausea, diarrhea, headaches, and trembling or muscle twitching. A mental health specialist can help determine which treatment is best for you. Some people see improvement with one type of therapy. However, other people require a combination of therapies. This information is not intended to replace advice given to you by your health care provider. Make sure you discuss any questions you have with your health care provider. Document Revised: 11/28/2020 Document Reviewed: 08/26/2020 Elsevier Patient Education  2022 [employer].   Shortness of Breath, Adult Shortness of breath is when a person has trouble breathing or when a person feels like she or he is having trouble breathing in enough air. Shortness of breath could be a sign of a medical problem. Follow these instructions at home: Pollutants Do not use any products that contain nicotine or tobacco. These products include cigarettes, chewing tobacco, and vaping devices, such as e-cigarettes. This also includes cigars and pipes. If you need help quitting, ask your health care provider. Avoid things that can irritate your airways, including: Smoke. This  includes campfire smoke, forest fire smoke, and secondhand smoke from tobacco products. Do not smoke or allow others to smoke in your home. Mold. Dust. Air pollution. Chemical fumes. Things that can give you an allergic reaction (allergens) if you have allergies. Common allergens include pollen from grasses or trees and animal dander. Keep your living space clean and free of mold and dust. General instructions Pay attention to any changes in your symptoms. Take over-the-counter and prescription medicines only as told by your health care provider. This includes oxygen therapy and inhaled medicines. Rest as needed. Return to your normal activities as told by your health care provider. Ask your health care provider what activities are safe for you. Keep all follow-up visits. This is important. Contact a health care provider if: Your condition does not improve as soon as expected. You have a hard time doing your normal activities, even after you rest. You have new symptoms. You cannot walk up stairs or exercise the way that you normally do. Get help right away if: Your shortness of breath gets worse. You have shortness of breath when you are resting. You feel light-headed or you faint. You have a cough that is not controlled with medicines. You cough up blood. You have pain with breathing. You have pain in your chest, arms, shoulders, or abdomen. You have a fever. These symptoms may be an emergency. Get help right away.  Call 911. Do not wait to see if the symptoms will go away. Do not drive yourself to the hospital. Summary Shortness of breath is when a person has trouble breathing enough air. It can be a sign of a medical problem. Avoid things that irritate your lungs, such as smoking, pollution, mold, and dust. Pay attention to changes in your symptoms and contact your health care provider if you have a hard time completing daily activities because of shortness of breath. This  information is not intended to replace advice given to you by your health care provider. Make sure you discuss any questions you have with your health care provider. Document Revised: 12/22/2020 Document Reviewed: 12/22/2020 Elsevier Patient Education  2022 [employer].

## 2021-07-30 ENCOUNTER — Other Ambulatory Visit: Payer: Self-pay

## 2021-07-30 ENCOUNTER — Other Ambulatory Visit (INDEPENDENT_AMBULATORY_CARE_PROVIDER_SITE_OTHER): Payer: Medicare PPO

## 2021-07-30 DIAGNOSIS — R7303 Prediabetes: Secondary | ICD-10-CM | POA: Diagnosis not present

## 2021-07-30 DIAGNOSIS — Z1389 Encounter for screening for other disorder: Secondary | ICD-10-CM

## 2021-07-30 DIAGNOSIS — Z1329 Encounter for screening for other suspected endocrine disorder: Secondary | ICD-10-CM | POA: Diagnosis not present

## 2021-07-30 DIAGNOSIS — E785 Hyperlipidemia, unspecified: Secondary | ICD-10-CM

## 2021-07-30 DIAGNOSIS — Z0000 Encounter for general adult medical examination without abnormal findings: Secondary | ICD-10-CM | POA: Diagnosis not present

## 2021-07-30 DIAGNOSIS — I10 Essential (primary) hypertension: Secondary | ICD-10-CM

## 2021-07-30 LAB — COMPREHENSIVE METABOLIC PANEL
ALT: 13 U/L (ref 0–35)
AST: 17 U/L (ref 0–37)
Albumin: 4.1 g/dL (ref 3.5–5.2)
Alkaline Phosphatase: 73 U/L (ref 39–117)
BUN: 22 mg/dL (ref 6–23)
CO2: 30 mEq/L (ref 19–32)
Calcium: 9.7 mg/dL (ref 8.4–10.5)
Chloride: 104 mEq/L (ref 96–112)
Creatinine, Ser: 0.72 mg/dL (ref 0.40–1.20)
GFR: 83 mL/min (ref >60.00)
Glucose, Bld: 87 mg/dL (ref 70–99)
Potassium: 3.9 mEq/L (ref 3.5–5.1)
Sodium: 141 mEq/L (ref 135–145)
Total Bilirubin: 0.8 mg/dL (ref 0.2–1.2)
Total Protein: 6.1 g/dL (ref 6.0–8.3)

## 2021-07-30 LAB — LIPID PANEL
Cholesterol: 182 mg/dL (ref 0–200)
HDL: 60 mg/dL (ref >39.00)
LDL Cholesterol: 105 mg/dL — ABNORMAL HIGH (ref 0–99)
NonHDL: 122.29
Total CHOL/HDL Ratio: 3
Triglycerides: 87 mg/dL (ref 0.0–149.0)
VLDL: 17.4 mg/dL (ref 0.0–40.0)

## 2021-07-30 LAB — CBC WITH DIFFERENTIAL/PLATELET
Basophils Absolute: 0.1 10*3/uL (ref 0.0–0.1)
Basophils Relative: 2.7 % (ref 0.0–3.0)
Eosinophils Absolute: 0.2 10*3/uL (ref 0.0–0.7)
Eosinophils Relative: 4.9 % (ref 0.0–5.0)
HCT: 43.4 % (ref 36.0–46.0)
Hemoglobin: 14.4 g/dL (ref 12.0–15.0)
Lymphocytes Relative: 44.3 % (ref 12.0–46.0)
Lymphs Abs: 1.6 10*3/uL (ref 0.7–4.0)
MCHC: 33.2 g/dL (ref 30.0–36.0)
MCV: 93 fl (ref 78.0–100.0)
Monocytes Absolute: 0.5 10*3/uL (ref 0.1–1.0)
Monocytes Relative: 12.9 % — ABNORMAL HIGH (ref 3.0–12.0)
Neutro Abs: 1.3 10*3/uL — ABNORMAL LOW (ref 1.4–7.7)
Neutrophils Relative %: 35.2 % — ABNORMAL LOW (ref 43.0–77.0)
Platelets: 203 10*3/uL (ref 150.0–400.0)
RBC: 4.67 Mil/uL (ref 3.87–5.11)
RDW: 13.6 % (ref 11.5–15.5)
WBC: 3.6 10*3/uL — ABNORMAL LOW (ref 4.0–10.5)

## 2021-07-30 LAB — URINALYSIS, ROUTINE W REFLEX MICROSCOPIC
Bilirubin Urine: NEGATIVE
Ketones, ur: NEGATIVE
Nitrite: NEGATIVE
Specific Gravity, Urine: 1.025 (ref 1.000–1.030)
Total Protein, Urine: NEGATIVE
Urine Glucose: NEGATIVE
Urobilinogen, UA: 0.2 (ref 0.0–1.0)
pH: 5.5 (ref 5.0–8.0)

## 2021-07-30 LAB — HEMOGLOBIN A1C: Hgb A1c MFr Bld: 6.1 % (ref 4.6–6.5)

## 2021-07-30 LAB — TSH: TSH: 3.64 u[IU]/mL (ref 0.35–5.50)

## 2021-08-01 ENCOUNTER — Ambulatory Visit: Payer: Medicare PPO | Admitting: Internal Medicine

## 2021-08-01 ENCOUNTER — Encounter: Payer: Self-pay | Admitting: Internal Medicine

## 2021-08-01 ENCOUNTER — Other Ambulatory Visit: Payer: Self-pay

## 2021-08-01 VITALS — BP 140/86 | HR 75 | Temp 97.7°F | Ht 62.4 in | Wt 166.0 lb

## 2021-08-01 DIAGNOSIS — E785 Hyperlipidemia, unspecified: Secondary | ICD-10-CM | POA: Diagnosis not present

## 2021-08-01 DIAGNOSIS — R7303 Prediabetes: Secondary | ICD-10-CM

## 2021-08-01 DIAGNOSIS — I10 Essential (primary) hypertension: Secondary | ICD-10-CM

## 2021-08-01 NOTE — Progress Notes (Signed)
Chief Complaint  ?Patient presents with  ? Follow-up  ? ?F/u  ?1. Prediabetes worse doing fruit combo with pineapple and mango and more sweets around holidays will change diet  ?2. Hld improved on mevacor 10 3x per week will stay on this  ?3. Sob improved has not heard from Dr. Juliann Pares cardiology referral but due to see 11/2021 she thinks changing bystolic 2.5 from qhs to earlier in the day is helping  ? ? ?Review of Systems  ?Constitutional:  Negative for weight loss.  ?HENT:  Negative for hearing loss.   ?Eyes:  Negative for blurred vision.  ?Respiratory:  Negative for shortness of breath.   ?Cardiovascular:  Negative for chest pain.  ?Gastrointestinal:  Negative for abdominal pain and blood in stool.  ?Genitourinary:  Negative for dysuria.  ?Musculoskeletal:  Negative for falls and joint pain.  ?Skin:  Negative for rash.  ?Neurological:  Negative for headaches.  ?Psychiatric/Behavioral:  Negative for depression.   ?Past Medical History:  ?Diagnosis Date  ? Arthritis   ? Atypical chest pain   ? a. 04/2015 Myoview: EF 78%, breast attenuation, no ischemia-->Low risk.  ? Back pain   ? scoliosis  ? Chicken pox   ? Dysrhythmia   ? hx palpitations  ? GERD (gastroesophageal reflux disease)   ? History of hiatal hernia   ? noted on cxr  ? Hyperlipidemia   ? Joint pain   ? Obesity, unspecified   ? Paroxysmal SVT (supraventricular tachycardia) (HCC)   ? a. 2013 Holter: PACs/PVCs; b. 10/2011 Ehco: EF nl, no rwma, mild LVH, mild TR, PASP ; c 04/2015 SVT in ED->resolved with adenosine; c.   ? Pneumonia 2010  ? hx of  ? Scoliosis   ? multiple areas of back follows with Dr. Dyke Maes chiropractor   ? ?Past Surgical History:  ?Procedure Laterality Date  ? ABDOMINAL HYSTERECTOMY    ? 1990 ovaries intact. had 2/2 fibroids last pap 2004 neg.  ? if cervix present   ? BACK SURGERY  1960's  ? d/t scoliosis-1964/65?  ? BACK SURGERY    ? x 3   ? BREAST BIOPSY Left 2008  ? CORE W/CLIP - NEG  ? BREAST BIOPSY    ? 2000   ? BREAST  EXCISIONAL BIOPSY Right 20 + yrs ago  ? BREAST SURGERY  1990  ? excision breast mass  ? CARDIAC ELECTROPHYSIOLOGY STUDY AND ABLATION    ? 10/12/19 for DVT [facility]  ? COLONOSCOPY  2007  ? Dr. Mechele Collin  ? COLONOSCOPY WITH ESOPHAGOGASTRODUODENOSCOPY (EGD) AND ESOPHAGEAL DILATION (ED)    ? COLONOSCOPY WITH PROPOFOL N/A 03/22/2018  ? Procedure: COLONOSCOPY WITH PROPOFOL;  Surgeon: Scot Jun, MD;  Location: [facility] ENDOSCOPY;  Service: Endoscopy;  Laterality: N/A;  ? ESOPHAGOGASTRODUODENOSCOPY (EGD) WITH PROPOFOL N/A 03/22/2018  ? Procedure: ESOPHAGOGASTRODUODENOSCOPY (EGD) WITH PROPOFOL;  Surgeon: Scot Jun, MD;  Location: [facility] ENDOSCOPY;  Service: Endoscopy;  Laterality: N/A;  ? EYE SURGERY  2007  ? cataracts  ? FOOT SURGERY  1960's  ? JOINT REPLACEMENT    ? total hip left with metal hardward 2016 Dr. Alphonsa Gin ortho   ? OTHER SURGICAL HISTORY  2004  ? Bilateral cataracts  ? PARTIAL HYSTERECTOMY  1990  ? REVISION TOTAL HIP ARTHROPLASTY Left 04/17/2014  ? DR Turner Daniels  ? TOTAL HIP ARTHROPLASTY  2006  ? TOTAL HIP REVISION Left 04/17/2014  ? Procedure: LEFT TOTAL HIP REVISION;  Surgeon: Nestor Lewandowsky, MD;  Location: [facility] OR;  Service:  Orthopedics;  Laterality: Left;  ? TOTAL KNEE ARTHROPLASTY Right 10/30/2017  ? Procedure: RIGHT TOTAL KNEE ARTHROPLASTY;  Surgeon: Frederik Pear, MD;  Location: [place];  Service: Orthopedics;  Laterality: Right;  ? ?Family History  ?Problem Relation Age of Onset  ? Dementia Mother   ?     dx early to mid 60s died 61  ? Cancer Mother   ?     breast cancer  ? Breast cancer Mother 77  ? Arthritis Mother   ? Varicose Veins Mother   ? Heart attack Father   ? Heart disease Father   ?     MI  ? Diabetes Father   ? Hypertension Father   ? Early death Father   ? Hyperlipidemia Father   ? Heart disease Brother   ? Hypertension Brother   ? Aneurysm Brother   ?     cardiac   ? Early death Brother   ? Colon cancer Cousin   ? ?Social History  ? ?Socioeconomic History  ? Marital status: Married  ?   Spouse name: Not on file  ? Number of children: Not on file  ? Years of education: Not on file  ? Highest education level: Not on file  ?Occupational History  ? Not on file  ?Tobacco Use  ? Smoking status: Never  ? Smokeless tobacco: Never  ?Vaping Use  ? Vaping Use: Never used  ?Substance and Sexual Activity  ? Alcohol use: Not Currently  ?  Comment: rare  ? Drug use: No  ? Sexual activity: Yes  ?  Birth control/protection: Surgical  ?Other Topics Concern  ? Not on file  ?Social History Narrative  ? Married moved from [place]  ? No kids   ? 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 ?Social Determinants of Health  ? ?Financial Resource Strain: Low Risk   ? Difficulty of Paying Living Expenses: Not hard at all  ?Food Insecurity: No Food Insecurity  ? Worried About Charity fundraiser in the Last Year: Never true  ? Ran Out of Food in the Last Year: Never true  ?Transportation Needs: No Transportation Needs  ? Lack of Transportation (Medical): No  ? Lack of Transportation (Non-Medical): No  ?Physical Activity: Sufficiently Active  ? Days of Exercise per Week: 5 days  ? Minutes of Exercise per Session: 30 min  ?Stress: No Stress Concern Present  ? Feeling of Stress : Not at all  ?Social Connections: Unknown  ? Frequency of Communication with Friends and Family: More than three times a week  ? Frequency of Social Gatherings with Friends and Family: More than three times a week  ? Attends Religious Services: Not on file  ? Active Member of Clubs or Organizations: Not on file  ? Attends Archivist Meetings: Not on file  ? Marital Status: Not on file  ?Intimate Partner Violence: Not At Risk  ? Fear of Current or Ex-Partner: No  ? Emotionally Abused: No  ? Physically Abused: No  ? Sexually Abused: No  ? ?Current Meds  ?Medication Sig  ? acetaminophen (TYLENOL) 325 MG tablet   ? aspirin EC 81 MG tablet Take 81 mg by mouth daily.  ? famotidine (PEPCID) 20 MG tablet Take 1 tablet (20 mg  total) by mouth daily.  ? lovastatin (MEVACOR) 10 MG tablet Take 1 tablet (10 mg total) by mouth 3 (three) times a week. At night after 6 PM  ?  meloxicam (MOBIC) 15 MG tablet Take 15 mg by mouth daily as needed.   ? nebivolol (BYSTOLIC) 2.5 MG tablet Take 1 tablet (2.5 mg total) by mouth daily.  ? ?Allergies  ?Allergen Reactions  ? Metoprolol Tartrate   ?  Sob with exertion, ? Heart pounding, fatigue and heart pounding even with 12.5 mg bid dosing ? ?  ? Pravastatin   ?  lethargy and fatigue.  After about 4 weeks, I also started feeling "heavy" heart beating and generally not feeling well.  ? Diltiazem Rash  ?  CD formulation likely generalized drug rash/eruption face, trunk, arms   ? Metoprolol Succinate [Metoprolol] Other (See Comments)  ?  Succinate version causes extreme lethargy   ? Tape Itching, Rash and Other (See Comments)  ?  Reaction: blisters (adhesive tape) ?Paper tape is ok  ? ?Recent Results (from the past 2160 hour(s))  ?Urinalysis, Routine w reflex microscopic     Status: Abnormal  ? Collection Time: 07/30/21  8:51 AM  ?Result Value Ref Range  ? Color, Urine YELLOW Yellow;Lt. Yellow;Straw;Dark Yellow;Amber;Green;Red;Brown  ? APPearance Sl Cloudy (A) Clear;Turbid;Slightly Cloudy;Cloudy  ? Specific Gravity, Urine 1.025 1.000 - 1.030  ? pH 5.5 5.0 - 8.0  ? Total Protein, Urine NEGATIVE Negative  ? Urine Glucose NEGATIVE Negative  ? Ketones, ur NEGATIVE Negative  ? Bilirubin Urine NEGATIVE Negative  ? Hgb urine dipstick TRACE-INTACT (A) Negative  ? Urobilinogen, UA 0.2 0.0 - 1.0  ? Leukocytes,Ua TRACE (A) Negative  ? Nitrite NEGATIVE Negative  ? WBC, UA 3-6/hpf (A) 0-2/hpf  ? RBC / HPF 0-2/hpf 0-2/hpf  ? Mucus, UA Presence of (A) None  ? Squamous Epithelial / LPF Few(5-10/hpf) (A) Rare(0-4/hpf)  ? Bacteria, UA Few(10-50/hpf) (A) None  ?CBC with Differential/Platelet     Status: Abnormal  ? Collection Time: 07/30/21  8:51 AM  ?Result Value Ref Range  ? WBC 3.6 (L) 4.0 - 10.5 K/uL  ? RBC 4.67 3.87 - 5.11  Mil/uL  ? Hemoglobin 14.4 12.0 - 15.0 g/dL  ? HCT 43.4 36.0 - 46.0 %  ? MCV 93.0 78.0 - 100.0 fl  ? MCHC 33.2 30.0 - 36.0 g/dL  ? RDW 13.6 11.5 - 15.5 %  ? Platelets 203.0 150.0 - 400.0 K/uL  ? Neutrophils Relati

## 2021-08-01 NOTE — Patient Instructions (Addendum)
[facility]   ?[address]   ?[place], [place] [address]   ?[phone]   Mirian Capuchin, MD   ?[address]   ?[place], [place] [address]   ?[phone] (Work)   ?[phone] (Fax)    ? ? ?Prediabetes ?Prediabetes is when your blood sugar (blood glucose) level is higher than normal but not high enough for you to be diagnosed with type 2 diabetes. Having prediabetes puts you at risk for developing type 2 diabetes (type 2 diabetes mellitus). ?With certain lifestyle changes, you may be able to prevent or delay the onset of type 2 diabetes. This is important because type 2 diabetes can lead to serious complications, such as: ?Heart disease. ?Stroke. ?Blindness. ?Kidney disease. ?Depression. ?Poor circulation in the feet and legs. In severe cases, this could lead to surgical removal of a leg (amputation). ?What are the causes? ?The exact cause of prediabetes is not known. It may result from insulin resistance. Insulin resistance develops when cells in the body do not respond properly to insulin that the body makes. This can cause excess glucose to build up in the blood. High blood glucose (hyperglycemia) can develop. ?What increases the risk? ?The following factors may make you more likely to develop this condition: ?You have a family member with type 2 diabetes. ?You are older than 45 years. ?You had a temporary form of diabetes during a pregnancy (gestational diabetes). ?You had polycystic ovary syndrome (PCOS). ?You are overweight or obese. ?You are inactive (sedentary). ?You have a history of heart disease, including problems with cholesterol levels, high levels of blood fats, or high blood pressure. ?What are the signs or symptoms? ?You may have no symptoms. If you do have symptoms, they may include: ?Increased hunger. ?Increased thirst. ?Increased urination. ?Vision changes, such as blurry vision. ?Tiredness (fatigue). ?How is this diagnosed? ?This condition can be diagnosed with  blood tests. Your blood glucose may be checked with one or more of the following tests: ?A fasting blood glucose (FBG) test. You will not be allowed to eat (you will fast) for at least 8 hours before a blood sample is taken. ?An A1C blood test (hemoglobin A1C). This test provides information about blood glucose levels over the previous 2?3 months. ?An oral glucose tolerance test (OGTT). This test measures your blood glucose at two points in time: ?After fasting. This is your baseline level. ?Two hours after you drink a beverage that contains glucose. ?You may be diagnosed with prediabetes if: ?Your FBG is 100?125 mg/dL (8.1-8.2 mmol/L). ?Your A1C level is 5.7?6.4% (39-46 mmol/mol). ?Your OGTT result is 140?199 mg/dL (9.9-37 mmol/L). ?These blood tests may be repeated to confirm your diagnosis. ?How is this treated? ?Treatment may include dietary and lifestyle changes to help lower your blood glucose and prevent type 2 diabetes from developing. In some cases, medicine may be prescribed to help lower the risk of type 2 diabetes. ?Follow these instructions at home: ?Nutrition ? ?Follow a healthy meal plan. This includes eating lean proteins, whole grains, legumes, fresh fruits and vegetables, low-fat dairy products, and healthy fats. ?Follow instructions from your health care provider about eating or drinking restrictions. ?Meet with a dietitian to create a healthy eating plan that is right for you. ?Lifestyle ?Do moderate-intensity exercise for at least 30 minutes a day on 5 or more days each week, or as told by your health care provider. A mix of activities may be best, such as: ?Brisk walking, swimming, biking, and weight lifting. ?Lose weight as  told by your health care provider. Losing 5-7% of your body weight can reverse insulin resistance. ?Do not drink alcohol if: ?Your health care provider tells you not to drink. ?You are pregnant, may be pregnant, or are planning to become pregnant. ?If you drink  alcohol: ?Limit how much you use to: ?0-1 drink a day for women. ?0-2 drinks a day for men. ?Be aware of how much alcohol is in your drink. In the [place] one drink equals one 12 oz bottle of beer (355 mL), one 5 oz glass of wine (148 mL), or one 1? oz glass of hard liquor (44 mL). ?General instructions ?Take over-the-counter and prescription medicines only as told by your health care provider. You may be prescribed medicines that help lower the risk of type 2 diabetes. ?Do not use any products that contain nicotine or tobacco, such as cigarettes, e-cigarettes, and chewing tobacco. If you need help quitting, ask your health care provider. ?Keep all follow-up visits. This is important. ?Where to find more information ?American Diabetes Association: www.diabetes.org ?Academy of Nutrition and Dietetics: www.eatright.org ?American Heart Association: www.heart.org ?Contact a health care provider if: ?You have any of these symptoms: ?Increased hunger. ?Increased urination. ?Increased thirst. ?Fatigue. ?Vision changes, such as blurry vision. ?Get help right away if you: ?Have shortness of breath. ?Feel confused. ?Vomit or feel like you may vomit. ?Summary ?Prediabetes is when your blood sugar (blood glucose)level is higher than normal but not high enough for you to be diagnosed with type 2 diabetes. ?Having prediabetes puts you at risk for developing type 2 diabetes (type 2 diabetes mellitus). ?Make lifestyle changes such as eating a healthy diet and exercising regularly to help prevent diabetes. Lose weight as told by your health care provider. ?This information is not intended to replace advice given to you by your health care provider. Make sure you discuss any questions you have with your health care provider. ?Document Revised: 08/04/2019 Document Reviewed: 08/04/2019 ?Elsevier Patient Education ? 2022 Elsevier Inc. ? ?Prediabetes Eating Plan ?Prediabetes is a condition that causes blood sugar (glucose) levels to be  higher than normal. This increases the risk for developing type 2 diabetes (type 2 diabetes mellitus). Working with a health care provider or nutrition specialist (dietitian) to make diet and lifestyle changes can help prevent the onset of diabetes. These changes may help you: ?Control your blood glucose levels. ?Improve your cholesterol levels. ?Manage your blood pressure. ?What are tips for following this plan? ?Reading food labels ?Read food labels to check the amount of fat, salt (sodium), and sugar in prepackaged foods. Avoid foods that have: ?Saturated fats. ?Trans fats. ?Added sugars. ?Avoid foods that have more than 300 milligrams (mg) of sodium per serving. Limit your sodium intake to less than 2,300 mg each day. ?Shopping ?Avoid buying pre-made and processed foods. ?Avoid buying drinks with added sugar. ?Cooking ?Cook with olive oil. Do not use butter, lard, or ghee. ?Bake, broil, grill, steam, or boil foods. Avoid frying. ?Meal planning ? ?Work with your dietitian to create an eating plan that is right for you. This may include tracking how many calories you take in each day. Use a food diary, notebook, or mobile application to track what you eat at each meal. ?Consider following a Mediterranean diet. This includes: ?Eating several servings of fresh fruits and vegetables each day. ?Eating fish at least twice a week. ?Eating one serving each day of whole grains, beans, nuts, and seeds. ?Using olive oil instead of other fats. ?Limiting alcohol. ?Limiting  red meat. ?Using nonfat or low-fat dairy products. ?Consider following a plant-based diet. This includes dietary choices that focus on eating mostly vegetables and fruit, grains, beans, nuts, and seeds. ?If you have high blood pressure, you may need to limit your sodium intake or follow a diet such as the DASH (Dietary Approaches to Stop Hypertension) eating plan. The DASH diet aims to lower high blood pressure. ?Lifestyle ?Set weight loss goals with help  from your health care team. It is recommended that most people with prediabetes lose 7% of their body weight. ?Exercise for at least 30 minutes 5 or more days a week. ?Attend a support group or seek support from a mental

## 2021-08-07 ENCOUNTER — Telehealth: Payer: Self-pay | Admitting: Internal Medicine

## 2021-08-07 DIAGNOSIS — R8290 Unspecified abnormal findings in urine: Secondary | ICD-10-CM

## 2021-08-07 NOTE — Telephone Encounter (Signed)
Kristy Mejia, CMA  ?08/07/2021  2:48 PM EDT Back to Top  ?  ?Patient has seen results on mychart. Left message to return call.  ? [name], MD  ?07/30/2021  3:51 PM EDT   ?  ?Urine bacteria and trace blood  ?-any uti sx's? If so order urine culture ?White blood ct slightly low stable previous work up with hematology negative  ?Cholesterol improved great work goal LDL <100  ?Thyroid lab normal  ?A1c worse=prediabetes again  ?Liver kidneys normal   ?Orders placed for urine. Okay to schedule if Patient calls back in and having uti symptoms  ?

## 2021-10-02 ENCOUNTER — Ambulatory Visit (INDEPENDENT_AMBULATORY_CARE_PROVIDER_SITE_OTHER): Payer: Medicare PPO

## 2021-10-02 VITALS — Ht 62.4 in | Wt 166.0 lb

## 2021-10-02 DIAGNOSIS — Z0000 Encounter for general adult medical examination without abnormal findings: Secondary | ICD-10-CM

## 2021-10-02 NOTE — Progress Notes (Signed)
Subjective:   Kristy Mejia is a 76 y.o. female who presents for Medicare Annual (Subsequent) preventive examination.  Review of Systems    No ROS.  Medicare Wellness Virtual Visit.  Visual/audio telehealth visit, UTA vital signs.   See social history for additional risk factors.   Cardiac Risk Factors include: advanced age (>5men, >72 women)     Objective:    Today's Vitals   10/02/21 1431  Weight: 166 lb (75.3 kg)  Height: 5' 2.4" (1.585 m)   Body mass index is 29.97 kg/m.     10/01/2020    1:02 PM 05/02/2020    4:11 PM 09/29/2019    1:08 PM 08/26/2019    6:25 PM 07/31/2019    1:44 PM 10/08/2018    9:30 AM 09/28/2018    3:23 PM  Advanced Directives  Does Patient Have a Medical Advance Directive? Yes Yes Yes No No Yes Yes  Type of Estate agent of [place];Living will Healthcare Power of [place];Living will Living will;Healthcare Power of [employer] Power of [place];Living will Healthcare Power of [place];Living will  Does patient want to make changes to medical advance directive? No - Patient declined  No - Patient declined   No - Patient declined No - Patient declined  Copy of Healthcare Power of Attorney in Chart? Yes - validated most recent copy scanned in chart (See row information)  Yes - validated most recent copy scanned in chart (See row information)    Yes - validated most recent copy scanned in chart (See row information)  Would patient like information on creating a medical advance directive?    No - Patient declined No - Patient declined      Current Medications (verified) Outpatient Encounter Medications as of 10/02/2021  Medication Sig   acetaminophen (TYLENOL) 325 MG tablet    aspirin EC 81 MG tablet Take 81 mg by mouth daily.   famotidine (PEPCID) 20 MG tablet Take 1 tablet (20 mg total) by mouth daily.   lovastatin (MEVACOR) 10 MG tablet Take 1 tablet (10 mg total) by mouth 3 (three) times a week. At night after 6 PM    meloxicam (MOBIC) 15 MG tablet Take 15 mg by mouth daily as needed.    nebivolol (BYSTOLIC) 2.5 MG tablet Take 1 tablet (2.5 mg total) by mouth daily.   No facility-administered encounter medications on file as of 10/02/2021.    Allergies (verified) Metoprolol tartrate, Pravastatin, Diltiazem, Metoprolol succinate [metoprolol], and Tape   History: Past Medical History:  Diagnosis Date   Arthritis    Atypical chest pain    a. 04/2015 Myoview: EF 78%, breast attenuation, no ischemia-->Low risk.   Back pain    scoliosis   Chicken pox    Dysrhythmia    hx palpitations   GERD (gastroesophageal reflux disease)    History of hiatal hernia    noted on cxr   Hyperlipidemia    Joint pain    Obesity, unspecified    Paroxysmal SVT (supraventricular tachycardia) (HCC)    a. 2013 Holter: PACs/PVCs; b. 10/2011 Ehco: EF nl, no rwma, mild LVH, mild TR, PASP ; c 04/2015 SVT in ED->resolved with adenosine; c.    Pneumonia 2010   hx of   Scoliosis    multiple areas of back follows with Dr. Dyke Maes chiropractor    Past Surgical History:  Procedure Laterality Date   ABDOMINAL HYSTERECTOMY     1990 ovaries intact. had 2/2 fibroids last pap  2004 neg.  ? if cervix present    BACK SURGERY  1960's   d/t scoliosis-1964/65?   BACK SURGERY     x 3    BREAST BIOPSY Left 2008   CORE W/CLIP - NEG   BREAST BIOPSY     2000    BREAST EXCISIONAL BIOPSY Right 20 + yrs ago   BREAST SURGERY  1990   excision breast mass   CARDIAC ELECTROPHYSIOLOGY STUDY AND ABLATION     10/12/19 for DVT [facility]   COLONOSCOPY  2007   Dr. Mechele Collin   COLONOSCOPY WITH ESOPHAGOGASTRODUODENOSCOPY (EGD) AND ESOPHAGEAL DILATION (ED)     COLONOSCOPY WITH PROPOFOL N/A 03/22/2018   Procedure: COLONOSCOPY WITH PROPOFOL;  Surgeon: Scot Jun, MD;  Location: [facility] ENDOSCOPY;  Service: Endoscopy;  Laterality: N/A;   ESOPHAGOGASTRODUODENOSCOPY (EGD) WITH PROPOFOL N/A 03/22/2018   Procedure: ESOPHAGOGASTRODUODENOSCOPY (EGD)  WITH PROPOFOL;  Surgeon: Scot Jun, MD;  Location: [facility] ENDOSCOPY;  Service: Endoscopy;  Laterality: N/A;   EYE SURGERY  2007   cataracts   FOOT SURGERY  1960's   JOINT REPLACEMENT     total hip left with metal hardward 2016 Dr. Alphonsa Gin ortho    OTHER SURGICAL HISTORY  2004   Bilateral cataracts   PARTIAL HYSTERECTOMY  1990   REVISION TOTAL HIP ARTHROPLASTY Left 04/17/2014   DR Turner Daniels   TOTAL HIP ARTHROPLASTY  2006   TOTAL HIP REVISION Left 04/17/2014   Procedure: LEFT TOTAL HIP REVISION;  Surgeon: Nestor Lewandowsky, MD;  Location: [facility] OR;  Service: Orthopedics;  Laterality: Left;   TOTAL KNEE ARTHROPLASTY Right 10/30/2017   Procedure: RIGHT TOTAL KNEE ARTHROPLASTY;  Surgeon: Gean Birchwood, MD;  Location: [facility] OR;  Service: Orthopedics;  Laterality: Right;   Family History  Problem Relation Age of Onset   Dementia Mother        dx early to mid 79s died 30   Cancer Mother        breast cancer   Breast cancer Mother 56   Arthritis Mother    Varicose Veins Mother    Heart attack Father    Heart disease Father        MI   Diabetes Father    Hypertension Father    Early death Father    Hyperlipidemia Father    Heart disease Brother    Hypertension Brother    Aneurysm Brother        cardiac    Early death Brother    Colon cancer Cousin    Social History   Socioeconomic History   Marital status: Married    Spouse name: Not on file   Number of children: Not on file   Years of education: Not on file   Highest education level: Not on file  Occupational History   Not on file  Tobacco Use   Smoking status: Never   Smokeless tobacco: Never  Vaping Use   Vaping Use: Never used  Substance and Sexual Activity   Alcohol use: Not Currently    Comment: rare   Drug use: No   Sexual activity: Yes    Birth control/protection: Surgical  Other Topics Concern   Not on file  Social History Narrative   Married moved from [place]   No kids    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    Social Determinants of Health   Financial Resource Strain: Low Risk    Difficulty of Paying Living Expenses:  Not hard at all  Food Insecurity: No Food Insecurity   Worried About Programme researcher, broadcasting/film/video in the Last Year: Never true   Ran Out of Food in the Last Year: Never true  Transportation Needs: No Transportation Needs   Lack of Transportation (Medical): No   Lack of Transportation (Non-Medical): No  Physical Activity: Sufficiently Active   Days of Exercise per Week: 5 days   Minutes of Exercise per Session: 30 min  Stress: No Stress Concern Present   Feeling of Stress : Not at all  Social Connections: Unknown   Frequency of Communication with Friends and Family: More than three times a week   Frequency of Social Gatherings with Friends and Family: More than three times a week   Attends Religious Services: Not on Scientist, clinical (histocompatibility and immunogenetics) or Organizations: Not on file   Attends Banker Meetings: Not on file   Marital Status: Not on file   Tobacco Counseling Counseling given: Not Answered  Clinical Intake: Pre-visit preparation completed: Yes        Diabetes: No  How often do you need to have someone help you when you read instructions, pamphlets, or other written materials from your doctor or pharmacy?: 1 - Never   Interpreter Needed?: No      Activities of Daily Living    10/02/2021    3:01 PM  In your present state of health, do you have any difficulty performing the following activities:  Hearing? 0  Vision? 0  Difficulty concentrating or making decisions? 0  Walking or climbing stairs? 0  Dressing or bathing? 0  Doing errands, shopping? 0  Preparing Food and eating ? N  Using the Toilet? N  In the past six months, have you accidently leaked urine? N  Do you have problems with loss of bowel control? N  Managing your Medications? N  Managing your Finances? N  Housekeeping or managing your  Housekeeping? N    Patient Care Team: McLean-Scocuzza, Pasty Spillers, MD as PCP - General (Internal Medicine) Iran Ouch, MD as PCP - Cardiology (Cardiology) Kieth Brightly, MD (General Surgery)  Indicate any recent Medical Services you may have received from other than [facility] providers in the past year (date may be approximate).     Assessment:   This is a routine wellness examination for Kristy Mejia.  Virtual Visit via Telephone Note  I connected with  Kristy Mejia on 10/02/21 at  2:30 PM EDT by telephone and verified that I am speaking with the correct person using two identifiers.  Persons participating in the virtual visit: patient/Nurse Health Advisor   I discussed the limitations of performing an evaluation and management service by telehealth. We continued and completed visit with audio only. Some vital signs may be absent or patient reported.   Hearing/Vision screen Hearing Screening - Comments:: Patient is able to hear conversational tones without difficulty. No issues reported. Vision Screening - Comments:: Followed by [facility] Wears corrective lenses when reading.  Cataract extraction, bilateral  They plan to schedule an eye exam with their ophthalmologist.  Dietary issues and exercise activities discussed: Current Exercise Habits: Home exercise routine, Intensity: Moderate Healthy diet Good water intake   Goals Addressed               This Visit's Progress     Patient Stated     Increase physical activity (pt-stated)        Increase use of stationary  bike for exercise        Depression Screen    10/02/2021    2:38 PM 04/04/2021    8:56 AM 10/01/2020   12:54 PM 05/02/2020    4:12 PM 09/30/2019    1:33 PM 09/29/2019    1:13 PM 09/28/2018    2:19 PM  PHQ 2/9 Scores  PHQ - 2 Score 0 0 0 0 0 0 0    Fall Risk    10/02/2021    3:00 PM 06/25/2021    3:06 PM 04/04/2021    8:56 AM 10/02/2020    9:06 AM 10/01/2020    1:03 PM  Fall Risk   Falls  in the past year? 0 0 0 0 0  Number falls in past yr: 0 0 0 0 0  Injury with Fall?  0 0 0 0  Risk for fall due to :  No Fall Risks No Fall Risks    Follow up Falls evaluation completed Falls evaluation completed Falls evaluation completed Falls evaluation completed Falls evaluation completed    FALL RISK PREVENTION PERTAINING TO THE HOME: Home free of loose throw rugs in walkways, pet beds, electrical cords, etc? Yes  Adequate lighting in your home to reduce risk of falls? Yes   ASSISTIVE DEVICES UTILIZED TO PREVENT FALLS: Life alert? No  Use of a cane, walker or w/c? No   TIMED UP AND GO: Was the test performed? No .   Cognitive Function: Patient is alert and oriented x3.     09/24/2017    3:53 PM  MMSE - Mini Mental State Exam  Orientation to time 5  Orientation to Place 5  Registration 3  Attention/ Calculation 5  Recall 3  Language- name 2 objects 2  Language- repeat 1  Language- follow 3 step command 3  Language- read & follow direction 1  Write a sentence 1  Copy design 1  Total score 30        09/29/2019    1:24 PM 09/28/2018    3:26 PM  6CIT Screen  What Year? 0 points 0 points  What month? 0 points 0 points  What time? 0 points 0 points  Count back from 20  0 points  Months in reverse 0 points 0 points  Repeat phrase  0 points  Total Score  0 points    Immunizations Immunization History  Administered Date(s) Administered   Moderna SARS-COV2 Booster Vaccination 09/03/2020, 02/04/2021   Moderna Sars-Covid-2 Vaccination 06/30/2019, 07/28/2019, 03/29/2020   Tdap 09/30/2019   Screening Tests Health Maintenance  Topic Date Due   Zoster Vaccines- Shingrix (1 of 2) 11/01/2021 (Originally 04/02/1965)   COVID-19 Vaccine (4 - Booster for Moderna series) 07/02/2022 (Originally 04/01/2021)   Pneumonia Vaccine 27+ Years old (1 - PCV) 08/02/2022 (Originally 04/01/2013)   INFLUENZA VACCINE  12/17/2021   MAMMOGRAM  02/19/2023   COLONOSCOPY (Pts 45-67yrs  Insurance coverage will need to be confirmed)  03/22/2028   TETANUS/TDAP  09/29/2029   DEXA SCAN  Completed   Hepatitis C Screening  Completed   HPV VACCINES  Aged Out   Health Maintenance There are no preventive care reminders to display for this patient.  Lung Cancer Screening: (Low Dose CT Chest recommended if Age 33-80 years, 30 pack-year currently smoking OR have quit w/in 15years.) does not qualify.   Vision Screening: Recommended annual ophthalmology exams for early detection of glaucoma and other disorders of the eye.  Dental Screening: Recommended annual dental exams for proper oral  hygiene  Community Resource Referral / Chronic Care Management: CRR required this visit?  No   CCM required this visit?  No      Plan:   Keep all routine maintenance appointments.   I have personally reviewed and noted the following in the patient's chart:   Medical and social history Use of alcohol, tobacco or illicit drugs  Current medications and supplements including opioid prescriptions.  Functional ability and status Nutritional status Physical activity Advanced directives List of other physicians Hospitalizations, surgeries, and ER visits in previous 12 months Vitals Screenings to include cognitive, depression, and falls Referrals and appointments  In addition, I have reviewed and discussed with patient certain preventive protocols, quality metrics, and best practice recommendations. A written personalized care plan for preventive services as well as general preventive health recommendations were provided to patient.     Ashok Pall, LPN   1/61/0960

## 2021-10-02 NOTE — Patient Instructions (Addendum)
?  Ms. Doby , ?Thank you for taking time to come for your Medicare Wellness Visit. I appreciate your ongoing commitment to your health goals. Please review the following plan we discussed and let me know if I can assist you in the future.  ? ?These are the goals we discussed: ? Goals   ? ?  ? Patient Stated  ?   Increase physical activity (pt-stated)   ?   Increase use of stationary bike for exercise ? ?  ? ?  ?  ?This is a list of the screening recommended for you and due dates:  ?Health Maintenance  ?Topic Date Due  ? Zoster (Shingles) Vaccine (1 of 2) 11/01/2021*  ? COVID-19 Vaccine (4 - Booster for Moderna series) 07/02/2022*  ? Pneumonia Vaccine (1 - PCV) 08/02/2022*  ? Flu Shot  12/17/2021  ? Mammogram  02/19/2023  ? Colon Cancer Screening  03/22/2028  ? Tetanus Vaccine  09/29/2029  ? DEXA scan (bone density measurement)  Completed  ? Hepatitis C Screening: USPSTF Recommendation to screen - Ages 78-79 yo.  Completed  ? HPV Vaccine  Aged Out  ?*Topic was postponed. The date shown is not the original due date.  ?  ?

## 2021-10-24 DIAGNOSIS — B351 Tinea unguium: Secondary | ICD-10-CM | POA: Diagnosis not present

## 2021-10-24 DIAGNOSIS — M2042 Other hammer toe(s) (acquired), left foot: Secondary | ICD-10-CM | POA: Diagnosis not present

## 2021-10-24 DIAGNOSIS — M79675 Pain in left toe(s): Secondary | ICD-10-CM | POA: Diagnosis not present

## 2021-10-24 DIAGNOSIS — M79674 Pain in right toe(s): Secondary | ICD-10-CM | POA: Diagnosis not present

## 2021-11-28 DIAGNOSIS — I700 Atherosclerosis of aorta: Secondary | ICD-10-CM | POA: Diagnosis not present

## 2021-11-28 DIAGNOSIS — E782 Mixed hyperlipidemia: Secondary | ICD-10-CM | POA: Diagnosis not present

## 2021-11-28 DIAGNOSIS — I471 Supraventricular tachycardia: Secondary | ICD-10-CM | POA: Diagnosis not present

## 2021-11-28 DIAGNOSIS — E669 Obesity, unspecified: Secondary | ICD-10-CM | POA: Diagnosis not present

## 2021-11-28 DIAGNOSIS — R7303 Prediabetes: Secondary | ICD-10-CM | POA: Diagnosis not present

## 2021-11-28 DIAGNOSIS — I38 Endocarditis, valve unspecified: Secondary | ICD-10-CM | POA: Diagnosis not present

## 2021-11-28 DIAGNOSIS — Z9889 Other specified postprocedural states: Secondary | ICD-10-CM | POA: Diagnosis not present

## 2021-11-28 DIAGNOSIS — I10 Essential (primary) hypertension: Secondary | ICD-10-CM | POA: Diagnosis not present

## 2021-11-28 DIAGNOSIS — G5601 Carpal tunnel syndrome, right upper limb: Secondary | ICD-10-CM | POA: Diagnosis not present

## 2021-11-28 DIAGNOSIS — R002 Palpitations: Secondary | ICD-10-CM | POA: Diagnosis not present

## 2022-01-30 ENCOUNTER — Other Ambulatory Visit (INDEPENDENT_AMBULATORY_CARE_PROVIDER_SITE_OTHER): Payer: Medicare PPO

## 2022-01-30 DIAGNOSIS — R7303 Prediabetes: Secondary | ICD-10-CM

## 2022-01-30 DIAGNOSIS — R8290 Unspecified abnormal findings in urine: Secondary | ICD-10-CM

## 2022-01-30 DIAGNOSIS — E785 Hyperlipidemia, unspecified: Secondary | ICD-10-CM

## 2022-01-30 DIAGNOSIS — I10 Essential (primary) hypertension: Secondary | ICD-10-CM | POA: Diagnosis not present

## 2022-01-30 LAB — CBC WITH DIFFERENTIAL/PLATELET
Basophils Absolute: 0.1 10*3/uL (ref 0.0–0.1)
Basophils Relative: 2.1 % (ref 0.0–3.0)
Eosinophils Absolute: 0.2 10*3/uL (ref 0.0–0.7)
Eosinophils Relative: 5.5 % — ABNORMAL HIGH (ref 0.0–5.0)
HCT: 42.7 % (ref 36.0–46.0)
Hemoglobin: 14 g/dL (ref 12.0–15.0)
Lymphocytes Relative: 40.7 % (ref 12.0–46.0)
Lymphs Abs: 1.2 10*3/uL (ref 0.7–4.0)
MCHC: 32.8 g/dL (ref 30.0–36.0)
MCV: 93.1 fl (ref 78.0–100.0)
Monocytes Absolute: 0.4 10*3/uL (ref 0.1–1.0)
Monocytes Relative: 13.3 % — ABNORMAL HIGH (ref 3.0–12.0)
Neutro Abs: 1.1 10*3/uL — ABNORMAL LOW (ref 1.4–7.7)
Neutrophils Relative %: 38.4 % — ABNORMAL LOW (ref 43.0–77.0)
Platelets: 185 10*3/uL (ref 150.0–400.0)
RBC: 4.59 Mil/uL (ref 3.87–5.11)
RDW: 13.6 % (ref 11.5–15.5)
WBC: 2.9 10*3/uL — ABNORMAL LOW (ref 4.0–10.5)

## 2022-01-30 LAB — COMPREHENSIVE METABOLIC PANEL
ALT: 17 U/L (ref 0–35)
AST: 23 U/L (ref 0–37)
Albumin: 3.8 g/dL (ref 3.5–5.2)
Alkaline Phosphatase: 61 U/L (ref 39–117)
BUN: 23 mg/dL (ref 6–23)
CO2: 29 mEq/L (ref 19–32)
Calcium: 9.3 mg/dL (ref 8.4–10.5)
Chloride: 104 mEq/L (ref 96–112)
Creatinine, Ser: 0.73 mg/dL (ref 0.40–1.20)
GFR: 81.35 mL/min (ref >60.00)
Glucose, Bld: 90 mg/dL (ref 70–99)
Potassium: 3.9 mEq/L (ref 3.5–5.1)
Sodium: 139 mEq/L (ref 135–145)
Total Bilirubin: 0.7 mg/dL (ref 0.2–1.2)
Total Protein: 6.4 g/dL (ref 6.0–8.3)

## 2022-01-30 LAB — LIPID PANEL
Cholesterol: 178 mg/dL (ref 0–200)
HDL: 66.4 mg/dL (ref >39.00)
LDL Cholesterol: 97 mg/dL (ref 0–99)
NonHDL: 111.13
Total CHOL/HDL Ratio: 3
Triglycerides: 69 mg/dL (ref 0.0–149.0)
VLDL: 13.8 mg/dL (ref 0.0–40.0)

## 2022-01-30 LAB — HEMOGLOBIN A1C: Hgb A1c MFr Bld: 6.2 % (ref 4.6–6.5)

## 2022-01-31 LAB — URINE CULTURE
MICRO NUMBER:: 13917969
Result:: NO GROWTH
SPECIMEN QUALITY:: ADEQUATE

## 2022-02-03 ENCOUNTER — Telehealth: Payer: Self-pay

## 2022-02-03 NOTE — Telephone Encounter (Signed)
Patient returned  office phone call, note was read. 

## 2022-02-03 NOTE — Telephone Encounter (Signed)
Lvm for pt to return call in regards to lab results.  Per Dr.Tracy: White blood cell slightly low likely benign as you had work up with hematology in 2020 and they were not concerned  +prediabetes  Cholesterol improved  Liver kidneys normal  Urine no UTI

## 2022-02-04 ENCOUNTER — Encounter: Payer: Self-pay | Admitting: Internal Medicine

## 2022-02-04 ENCOUNTER — Ambulatory Visit: Payer: Medicare PPO | Admitting: Internal Medicine

## 2022-02-04 VITALS — BP 130/62 | HR 59 | Temp 98.0°F | Ht 62.4 in | Wt 168.4 lb

## 2022-02-04 DIAGNOSIS — K219 Gastro-esophageal reflux disease without esophagitis: Secondary | ICD-10-CM

## 2022-02-04 DIAGNOSIS — R7303 Prediabetes: Secondary | ICD-10-CM

## 2022-02-04 DIAGNOSIS — Z23 Encounter for immunization: Secondary | ICD-10-CM | POA: Diagnosis not present

## 2022-02-04 DIAGNOSIS — Z1231 Encounter for screening mammogram for malignant neoplasm of breast: Secondary | ICD-10-CM | POA: Diagnosis not present

## 2022-02-04 DIAGNOSIS — I10 Essential (primary) hypertension: Secondary | ICD-10-CM

## 2022-02-04 DIAGNOSIS — E785 Hyperlipidemia, unspecified: Secondary | ICD-10-CM | POA: Diagnosis not present

## 2022-02-04 DIAGNOSIS — Z1329 Encounter for screening for other suspected endocrine disorder: Secondary | ICD-10-CM

## 2022-02-04 DIAGNOSIS — K449 Diaphragmatic hernia without obstruction or gangrene: Secondary | ICD-10-CM | POA: Diagnosis not present

## 2022-02-04 MED ORDER — LOVASTATIN 10 MG PO TABS: 10.0000 mg | ORAL_TABLET | ORAL | 3 refills | Status: DC

## 2022-02-04 MED ORDER — FAMOTIDINE 20 MG PO TABS: 20.0000 mg | ORAL_TABLET | Freq: Every day | ORAL | 3 refills | Status: DC

## 2022-02-04 MED ORDER — NEBIVOLOL HCL 2.5 MG PO TABS: 2.5000 mg | ORAL_TABLET | Freq: Every day | ORAL | 3 refills | Status: DC

## 2022-02-04 NOTE — Patient Instructions (Signed)
Consider flu, covid shot and shingrix think about at pharmacy   Would rec prevnar 20   Zoster Vaccine, Recombinant injection What is this medication? ZOSTER VACCINE (ZOS ter vak SEEN) is a vaccine used to reduce the risk of getting shingles. This vaccine is not used to treat shingles or nerve pain from shingles. This medicine may be used for other purposes; ask your health care provider or pharmacist if you have questions. COMMON BRAND NAME(S): [facility] What should I tell my care team before I take this medication? They need to know if you have any of these conditions: cancer immune system problems an unusual or allergic reaction to Zoster vaccine, other medications, foods, dyes, or preservatives pregnant or trying to get pregnant breast-feeding How should I use this medication? This vaccine is injected into a muscle. It is given by a health care provider. A copy of Vaccine Information Statements will be given before each vaccination. Be sure to read this information carefully each time. This sheet may change often. Talk to your health care provider about the use of this vaccine in children. This vaccine is not approved for use in children. Overdosage: If you think you have taken too much of this medicine contact a poison control center or emergency room at once. NOTE: This medicine is only for you. Do not share this medicine with others. What if I miss a dose? Keep appointments for follow-up (booster) doses. It is important not to miss your dose. Call your health care provider if you are unable to keep an appointment. What may interact with this medication? medicines that suppress your immune system medicines to treat cancer steroid medicines like prednisone or cortisone This list may not describe all possible interactions. Give your health care provider a list of all the medicines, herbs, non-prescription drugs, or dietary supplements you use. Also tell them if you smoke, drink alcohol,  or use illegal drugs. Some items may interact with your medicine. What should I watch for while using this medication? Visit your health care provider regularly. This vaccine, like all vaccines, may not fully protect everyone. What side effects may I notice from receiving this medication? Side effects that you should report to your doctor or health care professional as soon as possible: allergic reactions (skin rash, itching or hives; swelling of the face, lips, or tongue) trouble breathing Side effects that usually do not require medical attention (report these to your doctor or health care professional if they continue or are bothersome): chills headache fever nausea pain, redness, or irritation at site where injected tiredness vomiting This list may not describe all possible side effects. Call your doctor for medical advice about side effects. You may report side effects to FDA at 1-800-FDA-1088. Where should I keep my medication? This vaccine is only given by a health care provider. It will not be stored at home. NOTE: This sheet is a summary. It may not cover all possible information. If you have questions about this medicine, talk to your doctor, pharmacist, or health care provider.  2023 Elsevier/Gold Standard (2021-04-05 00:00:00)  Pneumococcal Conjugate Vaccine (Prevnar 20) Suspension for Injection What is this medication? PNEUMOCOCCAL VACCINE (NEU mo KOK al vak SEEN) is a vaccine. It prevents pneumococcus bacterial infections. These bacteria can cause serious infections like pneumonia, meningitis, and blood infections. This vaccine will not treat an infection and will not cause infection. This vaccine is recommended for adults 18 years and older. This medicine may be used for other purposes; ask your health  care provider or pharmacist if you have questions. COMMON BRAND NAME(S): Prevnar 20 What should I tell my care team before I take this medication? They need to know if you  have any of these conditions: bleeding disorder fever immune system problems an unusual or allergic reaction to pneumococcal vaccine, diphtheria toxoid, other vaccines, other medicines, foods, dyes, or preservatives pregnant or trying to get pregnant breast-feeding How should I use this medication? This vaccine is injected into a muscle. It is given by a health care provider. A copy of Vaccine Information Statements will be given before each vaccination. Be sure to read this information carefully each time. This sheet may change often. Talk to your health care provider about the use of this medicine in children. Special care may be needed. Overdosage: If you think you have taken too much of this medicine contact a poison control center or emergency room at once. NOTE: This medicine is only for you. Do not share this medicine with others. What if I miss a dose? This does not apply. This medicine is not for regular use. What may interact with this medication? medicines for cancer chemotherapy medicines that suppress your immune function steroid medicines like prednisone or cortisone This list may not describe all possible interactions. Give your health care provider a list of all the medicines, herbs, non-prescription drugs, or dietary supplements you use. Also tell them if you smoke, drink alcohol, or use illegal drugs. Some items may interact with your medicine. What should I watch for while using this medication? Mild fever and pain should go away in 3 days or less. Report any unusual symptoms to your health care provider. What side effects may I notice from receiving this medication? Side effects that you should report to your doctor or health care professional as soon as possible: allergic reactions (skin rash, itching or hives; swelling of the face, lips, or tongue) confusion fast, irregular heartbeat fever over 102 degrees F muscle weakness seizures trouble breathing unusual  bruising or bleeding Side effects that usually do not require medical attention (report to your doctor or health care professional if they continue or are bothersome): fever of 102 degrees F or less headache joint pain muscle cramps, pain pain, tender at site where injected This list may not describe all possible side effects. Call your doctor for medical advice about side effects. You may report side effects to FDA at 1-800-FDA-1088. Where should I keep my medication? This vaccine is only given by a health care provider. It will not be stored at home. NOTE: This sheet is a summary. It may not cover all possible information. If you have questions about this medicine, talk to your doctor, pharmacist, or health care provider.  2023 Elsevier/Gold Standard (2020-01-06 00:00:00)

## 2022-02-04 NOTE — Progress Notes (Signed)
Chief Complaint  Patient presents with   Follow-up    6 month f/u   F/u  1. Hld improved on mevacor 10 3-4 x per week  htn controlled on bystolic 2.5    Review of Systems  Constitutional:  Negative for weight loss.  HENT:  Negative for hearing loss.   Eyes:  Negative for blurred vision.  Respiratory:  Negative for shortness of breath.   Cardiovascular:  Negative for chest pain.  Gastrointestinal:  Negative for abdominal pain and blood in stool.  Genitourinary:  Negative for dysuria.  Musculoskeletal:  Negative for falls and joint pain.  Skin:  Negative for rash.  Neurological:  Negative for headaches.  Psychiatric/Behavioral:  Negative for depression.    Past Medical History:  Diagnosis Date   Arthritis    Atypical chest pain    a. 04/2015 Myoview: EF 78%, breast attenuation, no ischemia-->Low risk.   Back pain    scoliosis   Chicken pox    Dysrhythmia    hx palpitations   GERD (gastroesophageal reflux disease)    History of hiatal hernia    noted on cxr   Hyperlipidemia    Joint pain    Obesity, unspecified    Paroxysmal SVT (supraventricular tachycardia) ([place])    a. 2013 Holter: PACs/PVCs; b. 10/2011 Ehco: EF nl, no rwma, mild LVH, mild TR, PASP 55mHg; c 04/2015 SVT in ED->resolved with adenosine; c.    Pneumonia 2010   hx of   Scoliosis    multiple areas of back follows with Dr. KBoston Servicechiropractor    Swine flu    2000s   Past Surgical History:  Procedure Laterality Date   ABDOMINAL HYSTERECTOMY     1990 ovaries intact. had 2/2 fibroids last pap 2004 neg.  ? if cervix present    BACK SURGERY  1960's   d/t scoliosis-1964/65?   BACK SURGERY     x 3    BREAST BIOPSY Left 2008   CORE W/CLIP - NEG   BREAST BIOPSY     2000    BREAST EXCISIONAL BIOPSY [place]  excision breast mass   CARDIAC ELECTROPHYSIOLOGY STUDY AND ABLATION     10/12/19 for DVT [facility]   COLONOSCOPY  2007   Dr. EVira Agar  COLONOSCOPY WITH  ESOPHAGOGASTRODUODENOSCOPY (EGD) AND ESOPHAGEAL DILATION (ED)     COLONOSCOPY WITH PROPOFOL N/A 03/22/2018   Procedure: COLONOSCOPY WITH PROPOFOL;  Surgeon: EManya Silvas MD;  [facility];  Service: Endoscopy;  Laterality: N/A;   ESOPHAGOGASTRODUODENOSCOPY (EGD) WITH PROPOFOL N/A 03/22/2018   Procedure: ESOPHAGOGASTRODUODENOSCOPY (EGD) WITH PROPOFOL;  Surgeon: EManya Silvas MD;  [facility];  Service: Endoscopy;  Laterality: N/A;   EYE SURGERY  2007   cataracts   FOOT SURGERY  1960's   JOINT REPLACEMENT     total hip left with metal hardward 2016 Dr. RJudd Lienortho    OTHER SURGICAL HISTORY  2004   Bilateral cataracts   PARTIAL HYSTERECTOMY  1990   REVISION TOTAL HIP ARTHROPLASTY Left 04/17/2014   DR RMayer Camel  TOTAL HIP ARTHROPLASTY  2006   TOTAL HIP REVISION Left 04/17/2014   Procedure: LEFT TOTAL HIP REVISION;  Surgeon: FKerin Salen MD;  [place]  Service: Orthopedics;  Laterality: Left;   TOTAL KNEE ARTHROPLASTY Right 10/30/2017   Procedure: RIGHT TOTAL KNEE ARTHROPLASTY;  Surgeon: RFrederik Pear MD;  [place]  Service: Orthopedics;  Laterality:  Right;   Family History  Problem Relation Age of Onset   Dementia Mother        dx early to mid 34s died 74   Cancer Mother        breast cancer   Breast cancer Mother 88   Arthritis Mother    Varicose Veins Mother    Heart attack Father    Heart disease Father        MI   Diabetes Father    Hypertension Father    Early death Father    Hyperlipidemia Father    Heart disease Brother    Hypertension Brother    Aneurysm Brother        cardiac    Early death Brother    Colon cancer Cousin    Social History   Socioeconomic History   Marital status: Married    Spouse name: Not on file   Number of children: Not on file   Years of education: Not on file   Highest education level: Not on file  Occupational History   Not on file  Tobacco Use   Smoking status: Never    Smokeless tobacco: Never  Vaping Use   Vaping Use: Never used  Substance and Sexual Activity   Alcohol use: Not Currently    Comment: rare   Drug use: No   Sexual activity: Yes    Birth control/protection: Surgical  Other Topics Concern   Not on file  Social History Narrative   Married moved from [place]   No kids    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    Social Determinants of Health   Financial Resource Strain: Low Risk  (10/02/2021)   Overall Financial Resource Strain (CARDIA)    Difficulty of Paying Living Expenses: Not hard at all  Food Insecurity: No [place] (10/02/2021)   Hunger Vital Sign    Worried About Running Out of Food in the Last Year: Never true    [place] in the Last Year: Never true  Transportation Needs: No Transportation Needs (10/02/2021)   PRAPARE - Hydrologist (Medical): No    Lack of Transportation (Non-Medical): No  Physical Activity: Sufficiently Active (10/02/2021)   Exercise Vital Sign    Days of Exercise per Week: 5 days    Minutes of Exercise per Session: 30 min  Stress: No Stress Concern Present (10/02/2021)   [place]    Feeling of Stress : Not at all  Social Connections: Unknown (10/02/2021)   Social Connection and Isolation Panel [NHANES]    Frequency of Communication with Friends and Family: More than three times a week    Frequency of Social Gatherings with Friends and Family: More than three times a week    Attends Religious Services: Not on file    Active Member of Clubs or Organizations: Not on file    Attends Archivist Meetings: Not on file    Marital Status: Not on file  Intimate Partner Violence: Not At Risk (10/02/2021)   Humiliation, Afraid, Rape, and Kick questionnaire    Fear of Current or Ex-Partner: No    Emotionally Abused: No    Physically Abused: No    Sexually Abused: No    Current Meds  Medication Sig   acetaminophen (TYLENOL) 325 MG tablet    aspirin EC 81 MG tablet Take 81 mg by mouth daily.  meloxicam (MOBIC) 15 MG tablet Take 15 mg by mouth daily as needed.    [DISCONTINUED] famotidine (PEPCID) 20 MG tablet Take 1 tablet (20 mg total) by mouth daily.   [DISCONTINUED] lovastatin (MEVACOR) 10 MG tablet Take 1 tablet (10 mg total) by mouth 3 (three) times a week. At night after 6 PM   [DISCONTINUED] nebivolol (BYSTOLIC) 2.5 MG tablet Take 1 tablet (2.5 mg total) by mouth daily.   Allergies  Allergen Reactions   Metoprolol Tartrate     Sob with exertion, ? Heart pounding, fatigue and heart pounding even with 12.5 mg bid dosing     Pravastatin     lethargy and fatigue.  After about 4 weeks, I also started feeling "heavy" heart beating and generally not feeling well.   Diltiazem Rash    CD formulation likely generalized drug rash/eruption face, trunk, arms    Metoprolol Succinate [Metoprolol] Other (See Comments)    Succinate version causes extreme lethargy    Tape Itching, Rash and Other (See Comments)    Reaction: blisters (adhesive tape) Paper tape is ok   Recent Results (from the past 2160 hour(s))  Urine Culture     Status: None   Collection Time: 01/30/22  8:15 AM   Specimen: Urine  Result Value Ref Range   MICRO NUMBER: [id number]    SPECIMEN QUALITY: Adequate    Sample Source URINE, CLEAN CATCH    STATUS: FINAL    Result: No Growth   CBC with Differential/Platelet     Status: Abnormal   Collection Time: 01/30/22  8:15 AM  Result Value Ref Range   WBC 2.9 (L) 4.0 - 10.5 K/uL   RBC 4.59 3.87 - 5.11 Mil/uL   Hemoglobin 14.0 12.0 - 15.0 g/dL   HCT 42.7 36.0 - 46.0 %   MCV 93.1 78.0 - 100.0 fl   MCHC 32.8 30.0 - 36.0 g/dL   RDW 13.6 11.5 - 15.5 %   Platelets 185.0 150.0 - 400.0 K/uL   Neutrophils Relative % 38.4 (L) 43.0 - 77.0 %   Lymphocytes Relative 40.7 12.0 - 46.0 %   Monocytes Relative 13.3 (H) 3.0 - 12.0 %   Eosinophils  Relative 5.5 (H) 0.0 - 5.0 %   Basophils Relative 2.1 0.0 - 3.0 %   Neutro Abs 1.1 (L) 1.4 - 7.7 K/uL   Lymphs Abs 1.2 0.7 - 4.0 K/uL   Monocytes Absolute 0.4 0.1 - 1.0 K/uL   Eosinophils Absolute 0.2 0.0 - 0.7 K/uL   Basophils Absolute 0.1 0.0 - 0.1 K/uL  Hemoglobin A1c     Status: None   Collection Time: 01/30/22  8:15 AM  Result Value Ref Range   Hgb A1c MFr Bld 6.2 4.6 - 6.5 %    Comment: Glycemic Control Guidelines for People with Diabetes:Non Diabetic:  <6%Goal of Therapy: <7%Additional Action Suggested:  >8%   Lipid panel     Status: None   Collection Time: 01/30/22  8:15 AM  Result Value Ref Range   Cholesterol 178 0 - 200 mg/dL    Comment: ATP III Classification       Desirable:  < 200 mg/dL               Borderline High:  200 - 239 mg/dL          High:  > = 240 mg/dL   Triglycerides 69.0 0.0 - 149.0 mg/dL    Comment: Normal:  <150 mg/dLBorderline High:  150 - 199 mg/dL  HDL 66.40 >39.00 mg/dL   VLDL 13.8 0.0 - 40.0 mg/dL   LDL Cholesterol 97 0 - 99 mg/dL   Total CHOL/HDL Ratio 3     Comment:                Men          Women1/2 Average Risk     3.4          3.3Average Risk          5.0          4.42X Average Risk          9.6          7.13X Average Risk          15.0          11.0                       NonHDL 111.13     Comment: NOTE:  Non-HDL goal should be 30 mg/dL higher than patient's LDL goal (i.e. LDL goal of < 70 mg/dL, would have non-HDL goal of < 100 mg/dL)  Comprehensive metabolic panel     Status: None   Collection Time: 01/30/22  8:15 AM  Result Value Ref Range   Sodium 139 135 - 145 mEq/L   Potassium 3.9 3.5 - 5.1 mEq/L   Chloride 104 96 - 112 mEq/L   CO2 29 19 - 32 mEq/L   Glucose, Bld 90 70 - 99 mg/dL   BUN 23 6 - 23 mg/dL   Creatinine, Ser 0.73 0.40 - 1.20 mg/dL   Total Bilirubin 0.7 0.2 - 1.2 mg/dL   Alkaline Phosphatase 61 39 - 117 U/L   AST 23 0 - 37 U/L   ALT 17 0 - 35 U/L   Total Protein 6.4 6.0 - 8.3 g/dL   Albumin 3.8 3.5 - 5.2 g/dL   GFR  81.35 >60.00 mL/min    Comment: Calculated using the CKD-EPI Creatinine Equation (2021)   Calcium 9.3 8.4 - 10.5 mg/dL   Objective  Body mass index is 30.41 kg/m. Wt Readings from Last 3 Encounters:  02/04/22 168 lb 6.4 oz (76.4 kg)  10/02/21 166 lb (75.3 kg)  08/01/21 166 lb (75.3 kg)   Temp Readings from Last 3 Encounters:  02/04/22 98 F (36.7 C) (Oral)  08/01/21 97.7 F (36.5 C) (Oral)  06/25/21 98.6 F (37 C) (Oral)   BP Readings from Last 3 Encounters:  02/04/22 130/62  08/01/21 140/86  06/25/21 128/66   Pulse Readings from Last 3 Encounters:  02/04/22 (!) 59  08/01/21 75  06/25/21 72    Physical Exam Vitals and nursing note reviewed.  Constitutional:      Appearance: Normal appearance. She is well-developed and well-groomed.  HENT:     Head: Normocephalic and atraumatic.  Eyes:     Conjunctiva/sclera: Conjunctivae normal.     Pupils: Pupils are equal, round, and reactive to light.  Cardiovascular:     Rate and Rhythm: Normal rate and regular rhythm.     Heart sounds: Normal heart sounds. No murmur heard. Pulmonary:     Effort: Pulmonary effort is normal.     Breath sounds: Normal breath sounds.  Abdominal:     General: Abdomen is flat. Bowel sounds are normal.     Tenderness: There is no abdominal tenderness.  Musculoskeletal:        General: No tenderness.  Skin:    General: Skin  is warm and dry.  Neurological:     General: No focal deficit present.     Mental Status: She is alert and oriented to person, place, and time. Mental status is at baseline.     Cranial Nerves: Cranial nerves 2-12 are intact.     Motor: Motor function is intact.     Coordination: Coordination is intact.     Gait: Gait is intact.  Psychiatric:        Attention and Perception: Attention and perception normal.        Mood and Affect: Mood and affect normal.        Speech: Speech normal.        Behavior: Behavior normal. Behavior is cooperative.        Thought Content:  Thought content normal.        Cognition and Memory: Cognition and memory normal.        Judgment: Judgment normal.     Assessment  Plan   Hyperlipidemia, unspecified hyperlipidemia type - Plan: Lipid panel, lovastatin (MEVACOR) 10 MG tablet 3-4 x per week   Hiatal hernia - Plan: famotidine (PEPCID) 20 MG tablet  Gastroesophageal reflux disease without esophagitis - Plan: famotidine (PEPCID) 20 MG tablet  Hypertension, controlled- Plan: nebivolol (BYSTOLIC) 2.5 MG tablet   HM Declines flu shot considering  prevnar 20 given today Tdap utd 5/5 shots moderna  Declines shingrix but thinking about it Consider hep B vaccine not immune  Immune MMR Hep C neg    Colonoscopy 2016 Dr. Juanita Craver need to get records.  -EGD/colonoscopy 03/22/2018 Dr. Tiffany Kocher int hemorrhoids f/u in 10 years and EGD moderate HH, duodenitis omeprazole 40 mg qd x 1 year; no bxs, esophagus dilated  10/03/20 referred leb    mammo repeat 02/18/21 negative ordered  Sch 02/19/22  Ordered 2024   No need pap s/p hysterectomy fibroids no h/o abnormal pap   DEXA 05/29/20 normal    Never smoker  rec healthy diet and exercise   Provider: Dr. Olivia Mackie McLean-Scocuzza-[facility]

## 2022-02-04 NOTE — Addendum Note (Signed)
Addended by: Gracy Racer on: 02/04/2022 09:22 AM   Modules accepted: Orders

## 2022-02-18 ENCOUNTER — Ambulatory Visit (INDEPENDENT_AMBULATORY_CARE_PROVIDER_SITE_OTHER): Payer: Medicare PPO

## 2022-02-18 DIAGNOSIS — Z23 Encounter for immunization: Secondary | ICD-10-CM | POA: Diagnosis not present

## 2022-02-19 ENCOUNTER — Ambulatory Visit
Admission: RE | Admit: 2022-02-19 | Discharge: 2022-02-19 | Disposition: A | Discharge status: Home / Self Care | Payer: Medicare PPO | Source: Ambulatory Visit | Attending: Internal Medicine | Admitting: Internal Medicine

## 2022-02-19 DIAGNOSIS — Z1231 Encounter for screening mammogram for malignant neoplasm of breast: Secondary | ICD-10-CM | POA: Insufficient documentation

## 2022-03-22 IMAGING — DX DG SHOULDER 2+V*R*
3 series · 3 of 3 positions shown · non-contrast
Comparison: 11/28/2013

CLINICAL DATA: BILATERAL shoulder pain since April 2020

EXAM:
RIGHT SHOULDER - 2+ VIEW

[shoulder (grashey) ap]
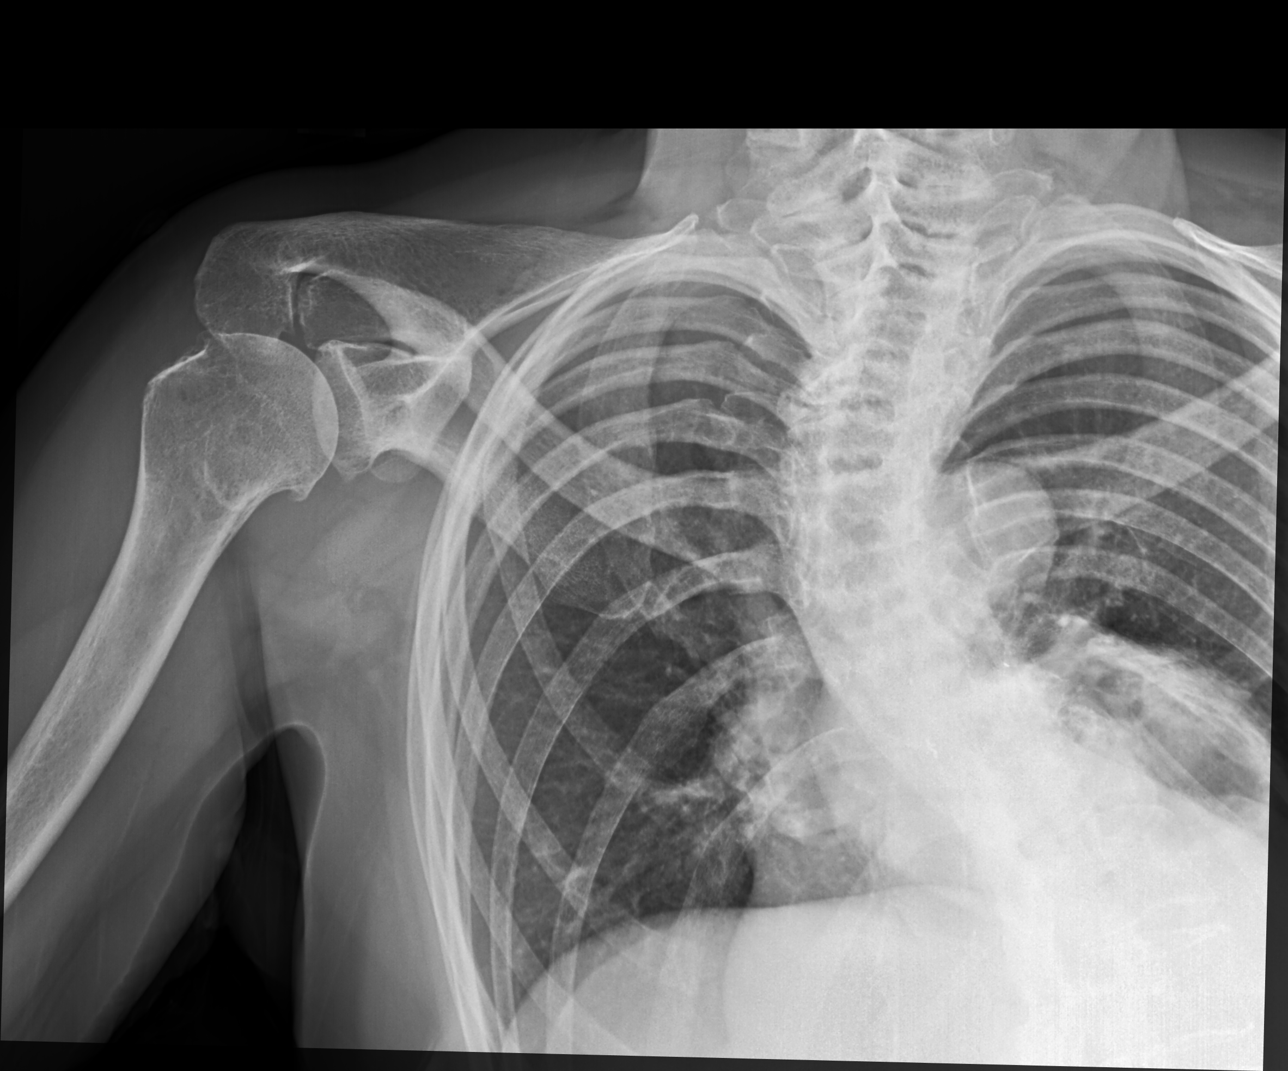

[shoulder y view]
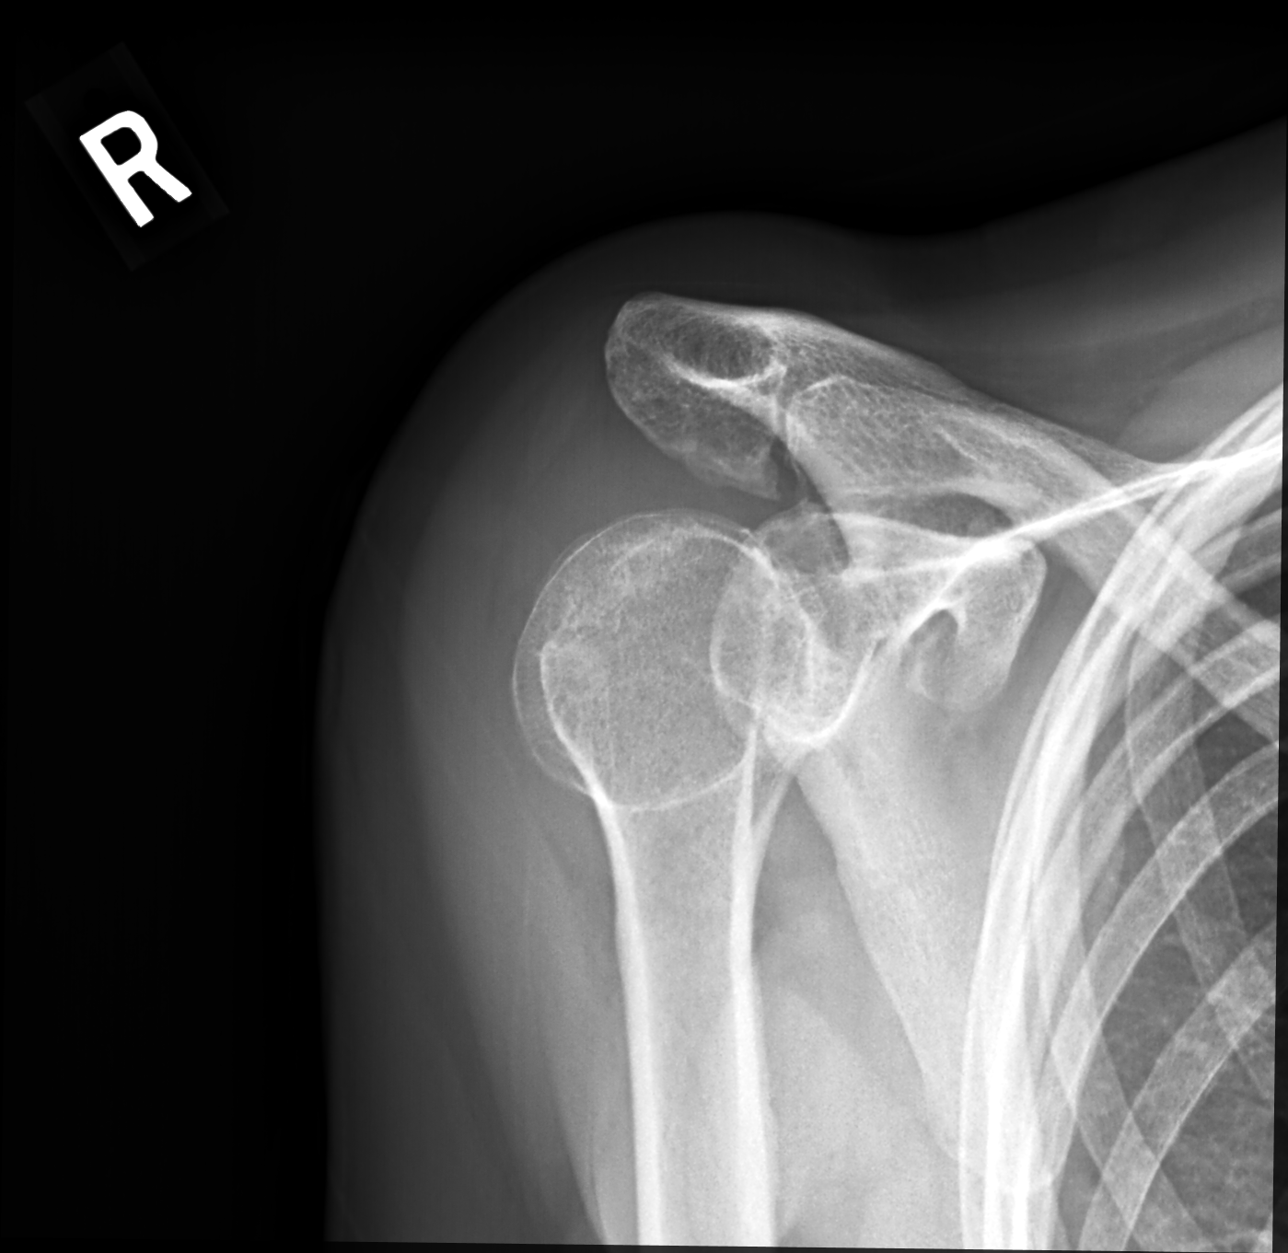

[shoulder axillary pa]
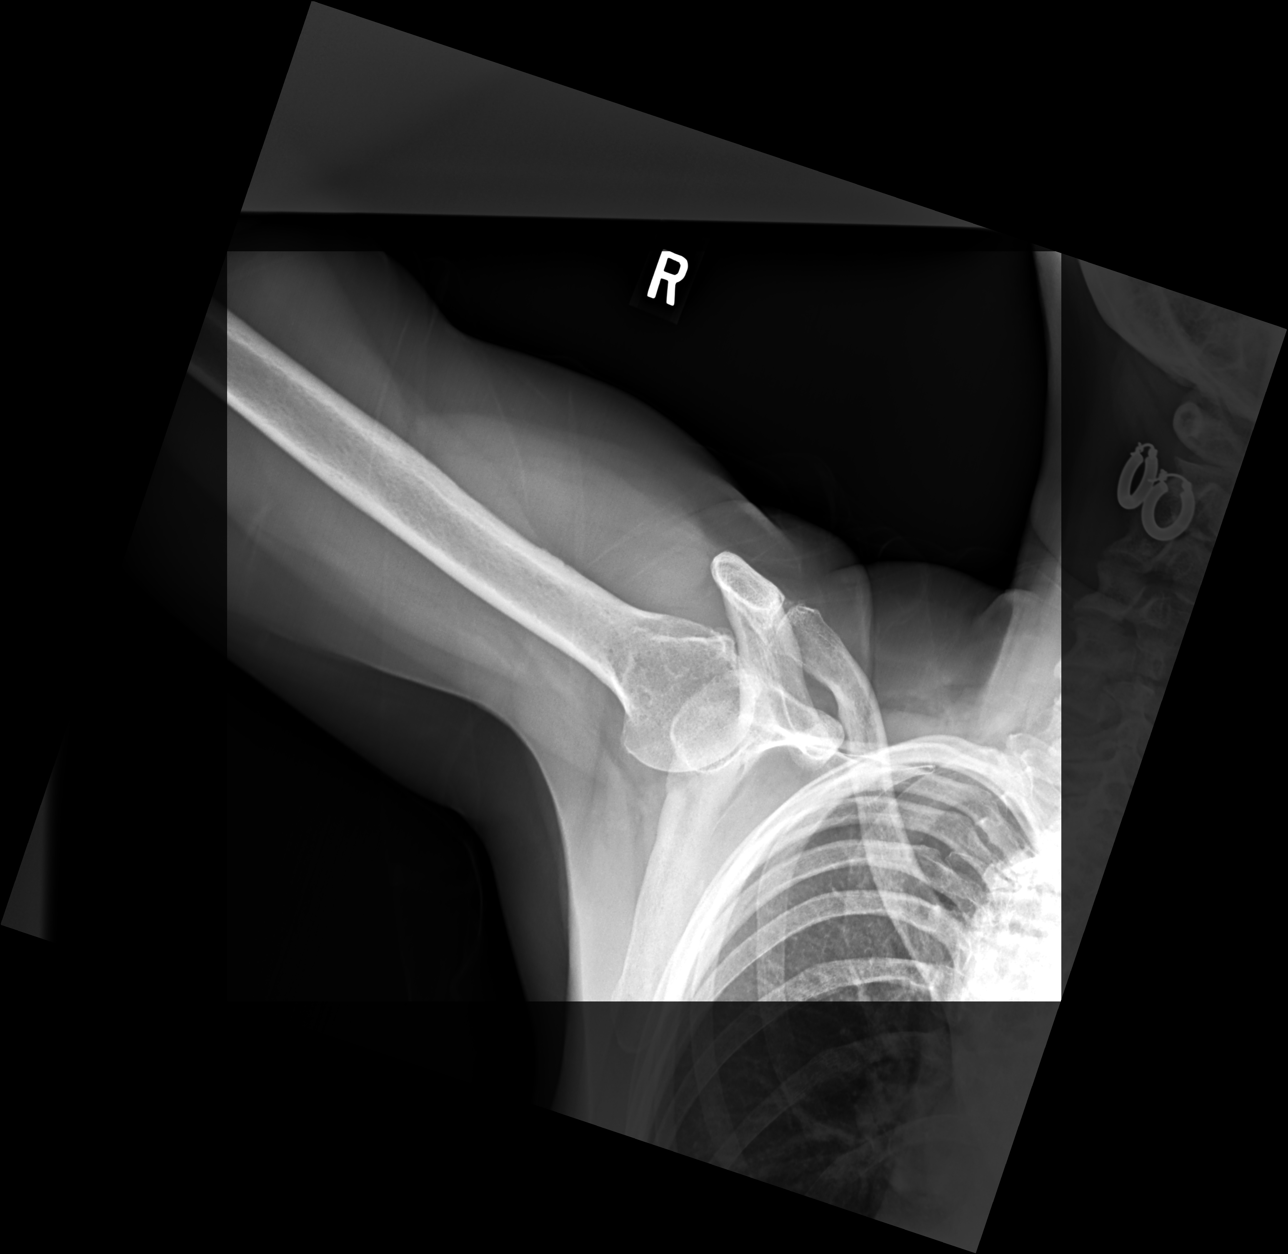

[3 of 3 positions shown; findings below may reference images not displayed]

FINDINGS: Osseous mineralization mildly decreased.

AC joint alignment normal with inferior acromial spur formation
noted.

Glenohumeral joint space narrowing.

No acute fracture, dislocation, or bone destruction.

Visualized RIGHT ribs intact.
IMPRESSION: RIGHT glenohumeral degenerative changes.

Inferior acromial spur formation.

No acute osseous abnormalities.

## 2022-07-24 ENCOUNTER — Telehealth: Payer: Self-pay | Admitting: Family Medicine

## 2022-07-24 NOTE — Telephone Encounter (Signed)
Patient has a lab appt 07/30/2022, there are No orders in.

## 2022-07-24 NOTE — Addendum Note (Signed)
Addended by: Fulton Mole D on: 07/24/2022 02:40 PM   Modules accepted: Orders

## 2022-07-30 ENCOUNTER — Other Ambulatory Visit: Payer: Medicare PPO

## 2022-08-03 NOTE — Patient Instructions (Incomplete)
It was a pleasure meeting you today. Thank you for allowing me to take part in your health care.  Our goals for today as we discussed include:   Continue Bystolic 2.5 mg daily for your blood pressure  Continue Mevacor 10 mg daily for your high cholesterol  Follow-up with cardiology as needed  We will get some labs today.  If they are abnormal or we need to do something about them, I will call you.  If they are normal, I will send you a message on MyChart (if it is active) or a letter in the mail.  If you don't hear from [place] in 2 weeks, please call the office at the number below.   Please schedule Medicare annual wellness visit  Follow-up in 2 weeks to discuss hot flashes  If you have any questions or concerns, please do not hesitate to call the office at [phone].  I look forward to our next visit and until then take care and stay safe.  Regards,   Carollee Leitz, MD   [facility]

## 2022-08-03 NOTE — Progress Notes (Signed)
SUBJECTIVE:   Chief Complaint  Patient presents with   Transitions Of Care   HPI Patient presents to clinic to transfer care.  Hypertension Currently on Bystolic 2.5 mg daily and tolerating well.  Asymptomatic.  Denies any headaches, visual changes, chest pain or shortness of breath.  Hyperlipidemia Takes Mevacor 10 mg 3 times a week.  Tolerating well.  No myalgias.  Leukopenia. Previously evaluated by Dr. Grayland Ormond, oncology.  WBC stable, asymptomatic and no recommendations for bone marrow biopsy at that time.  Recommended monitoring CBC 1-2 times a year and if becomes symptomatic or WBC continues to lower can refer for evaluation.  PSVT AV nodal ablation 2021.  Controlled with Bystolic 2.5 mg.  Follows with cardiology.   Hot flashes Previously on Premarin for menopausal symptoms.  Was told to discontinue medication in 2000.  Patient reports she continues to have hot flashes and would like to restart HRT.     PERTINENT PMH / PSH: Hypertension PSVT Hyperlipidemia Leukopenia  OBJECTIVE:  BP (!) 120/90   Pulse (!) 57   Temp 98.2 F (36.8 C) (Oral)   Ht 5\' 2"  (1.575 m)   Wt 171 lb 6.4 oz (77.7 kg)   SpO2 98%   BMI 31.35 kg/m    Physical Exam Vitals reviewed.  Constitutional:      General: She is not in acute distress.    Appearance: She is obese. She is not ill-appearing.  HENT:     Head: Normocephalic.     Nose: Nose normal.  Eyes:     Conjunctiva/sclera: Conjunctivae normal.  Neck:     Thyroid: No thyromegaly or thyroid tenderness.  Cardiovascular:     Rate and Rhythm: Normal rate and regular rhythm.     Heart sounds: Normal heart sounds.  Pulmonary:     Effort: Pulmonary effort is normal.     Breath sounds: Normal breath sounds.  Abdominal:     General: Abdomen is flat. Bowel sounds are normal.     Palpations: Abdomen is soft.  Musculoskeletal:        General: Normal range of motion.     Cervical back: Normal range of motion.  Neurological:      Mental Status: She is alert and oriented to person, place, and time. Mental status is at baseline.  Psychiatric:        Mood and Affect: Mood normal.        Behavior: Behavior normal.        Thought Content: Thought content normal.        Judgment: Judgment normal.     ASSESSMENT/PLAN:  Primary hypertension Assessment & Plan: Chronic.  Stable.  Tolerating medication well. Continue Bystolic 2.5 mg daily   Orders: -     CBC with Differential/Platelet -     Comprehensive metabolic panel -     TSH  Hot flashes Assessment & Plan: Chronic.  Previously on Premarin which helped with his symptoms.  Discontinued in 2000. Risk factors include age greater than 36, hypertension.  No history of breast cancer,r or endometrial cancer. If reinitiated will be at higher risk of invasive breast cancer, cardiovascular events or hypercoagulable state. Plan to follow-up in 2 weeks to discuss reinitiation of HRT.  Would prefer to use topical versus oral to decrease risk.   Prediabetes Assessment & Plan: Chronic. Check A1c  Orders: -     Hemoglobin A1c  Hyperlipidemia, unspecified hyperlipidemia type Assessment & Plan: Chronic.  Stable. On statin therapy and tolerating well.  No myalgias. Continue Mevacor 10 mg 3 times weekly Check fasting lipids  Orders: -     Lipid panel  Aortic atherosclerosis (HCC) Assessment & Plan: Chronic. On statin therapy. Continue Mevacor 10 mg 3 times weekly   Paroxysmal supraventricular tachycardia Assessment & Plan: Chronic.  Asymptomatic. Status post ablation 5/21 [facility]   Leukopenia, unspecified type Assessment & Plan: Chronic.  Asymptomatic. Repeat CBC   Breast cancer screening by mammogram -     3D Screening Mammogram, Left and Right; Future  HCM Medicare annual wellness due 5/24  Colonoscopy due 11/29 Mammogram due 10/24 order placed today. DEXA up-to-date Pneumonia 20 received 9/23 Declined shingles  vaccine  PDMP reviewed  Return for PCP. 2 weeks  Carollee Leitz, MD

## 2022-08-04 ENCOUNTER — Ambulatory Visit: Payer: Medicare PPO | Admitting: Family Medicine

## 2022-08-04 ENCOUNTER — Encounter: Payer: Self-pay | Admitting: Family Medicine

## 2022-08-04 VITALS — BP 120/90 | HR 57 | Temp 98.2°F | Ht 62.0 in | Wt 171.4 lb

## 2022-08-04 DIAGNOSIS — Z1231 Encounter for screening mammogram for malignant neoplasm of breast: Secondary | ICD-10-CM

## 2022-08-04 DIAGNOSIS — E785 Hyperlipidemia, unspecified: Secondary | ICD-10-CM

## 2022-08-04 DIAGNOSIS — R7303 Prediabetes: Secondary | ICD-10-CM

## 2022-08-04 DIAGNOSIS — R232 Flushing: Secondary | ICD-10-CM

## 2022-08-04 DIAGNOSIS — D72819 Decreased white blood cell count, unspecified: Secondary | ICD-10-CM

## 2022-08-04 DIAGNOSIS — I471 Supraventricular tachycardia, unspecified: Secondary | ICD-10-CM

## 2022-08-04 DIAGNOSIS — I10 Essential (primary) hypertension: Secondary | ICD-10-CM

## 2022-08-04 DIAGNOSIS — I700 Atherosclerosis of aorta: Secondary | ICD-10-CM

## 2022-08-04 LAB — CBC WITH DIFFERENTIAL/PLATELET
Basophils Absolute: 0 10*3/uL (ref 0.0–0.1)
Basophils Relative: 0.7 % (ref 0.0–3.0)
Eosinophils Absolute: 0.1 10*3/uL (ref 0.0–0.7)
Eosinophils Relative: 3.8 % (ref 0.0–5.0)
HCT: 46 % (ref 36.0–46.0)
Hemoglobin: 15.3 g/dL — ABNORMAL HIGH (ref 12.0–15.0)
Lymphocytes Relative: 32.8 % (ref 12.0–46.0)
Lymphs Abs: 1.2 10*3/uL (ref 0.7–4.0)
MCHC: 33.3 g/dL (ref 30.0–36.0)
MCV: 93.2 fl (ref 78.0–100.0)
Monocytes Absolute: 0.4 10*3/uL (ref 0.1–1.0)
Monocytes Relative: 10.6 % (ref 3.0–12.0)
Neutro Abs: 2 10*3/uL (ref 1.4–7.7)
Neutrophils Relative %: 52.1 % (ref 43.0–77.0)
Platelets: 226 10*3/uL (ref 150.0–400.0)
RBC: 4.93 Mil/uL (ref 3.87–5.11)
RDW: 13.7 % (ref 11.5–15.5)
WBC: 3.7 10*3/uL — ABNORMAL LOW (ref 4.0–10.5)

## 2022-08-04 LAB — COMPREHENSIVE METABOLIC PANEL
ALT: 15 U/L (ref 0–35)
AST: 20 U/L (ref 0–37)
Albumin: 3.9 g/dL (ref 3.5–5.2)
Alkaline Phosphatase: 66 U/L (ref 39–117)
BUN: 16 mg/dL (ref 6–23)
CO2: 28 mEq/L (ref 19–32)
Calcium: 9.7 mg/dL (ref 8.4–10.5)
Chloride: 105 mEq/L (ref 96–112)
Creatinine, Ser: 0.72 mg/dL (ref 0.40–1.20)
GFR: 82.41 mL/min (ref >60.00)
Glucose, Bld: 97 mg/dL (ref 70–99)
Potassium: 4.4 mEq/L (ref 3.5–5.1)
Sodium: 140 mEq/L (ref 135–145)
Total Bilirubin: 0.8 mg/dL (ref 0.2–1.2)
Total Protein: 6.5 g/dL (ref 6.0–8.3)

## 2022-08-04 LAB — LIPID PANEL
Cholesterol: 204 mg/dL — ABNORMAL HIGH (ref 0–200)
HDL: 73.5 mg/dL (ref >39.00)
LDL Cholesterol: 113 mg/dL — ABNORMAL HIGH (ref 0–99)
NonHDL: 130.16
Total CHOL/HDL Ratio: 3
Triglycerides: 86 mg/dL (ref 0.0–149.0)
VLDL: 17.2 mg/dL (ref 0.0–40.0)

## 2022-08-04 LAB — TSH: TSH: 1.52 u[IU]/mL (ref 0.35–5.50)

## 2022-08-04 LAB — HEMOGLOBIN A1C: Hgb A1c MFr Bld: 6.1 % (ref 4.6–6.5)

## 2022-08-05 ENCOUNTER — Other Ambulatory Visit: Payer: Self-pay | Admitting: Family Medicine

## 2022-08-08 ENCOUNTER — Encounter: Payer: Self-pay | Admitting: Family Medicine

## 2022-08-08 DIAGNOSIS — Z1231 Encounter for screening mammogram for malignant neoplasm of breast: Secondary | ICD-10-CM | POA: Insufficient documentation

## 2022-08-08 NOTE — Assessment & Plan Note (Signed)
Chronic.  Previously on Premarin which helped with his symptoms.  Discontinued in 2000. Risk factors include age greater than 4, hypertension.  No history of breast cancer,r or endometrial cancer. If reinitiated will be at higher risk of invasive breast cancer, cardiovascular events or hypercoagulable state. Plan to follow-up in 2 weeks to discuss reinitiation of HRT.  Would prefer to use topical versus oral to decrease risk.

## 2022-08-08 NOTE — Assessment & Plan Note (Signed)
Chronic.  Asymptomatic. Status post ablation 5/21 Follows with cardiology at [facility]

## 2022-08-08 NOTE — Assessment & Plan Note (Signed)
Chronic.  Stable.  Tolerating medication well. Continue Bystolic 2.5 mg daily

## 2022-08-08 NOTE — Assessment & Plan Note (Signed)
Chronic.  Stable. On statin therapy and tolerating well.  No myalgias. Continue Mevacor 10 mg 3 times weekly Check fasting lipids

## 2022-08-08 NOTE — Assessment & Plan Note (Signed)
Chronic. On statin therapy. Continue Mevacor 10 mg 3 times weekly

## 2022-08-08 NOTE — Assessment & Plan Note (Signed)
Chronic Check A1c 

## 2022-08-08 NOTE — Assessment & Plan Note (Signed)
Chronic.  Asymptomatic. Repeat CBC

## 2022-09-03 ENCOUNTER — Encounter: Payer: Self-pay | Admitting: Family Medicine

## 2022-09-03 ENCOUNTER — Ambulatory Visit: Payer: Medicare PPO | Admitting: Family Medicine

## 2022-09-03 VITALS — BP 122/72 | HR 68 | Temp 98.6°F | Resp 16 | Ht 62.0 in | Wt 173.0 lb

## 2022-09-03 DIAGNOSIS — I471 Supraventricular tachycardia, unspecified: Secondary | ICD-10-CM

## 2022-09-03 DIAGNOSIS — I700 Atherosclerosis of aorta: Secondary | ICD-10-CM

## 2022-09-03 DIAGNOSIS — Z7689 Persons encountering health services in other specified circumstances: Secondary | ICD-10-CM

## 2022-09-03 DIAGNOSIS — I10 Essential (primary) hypertension: Secondary | ICD-10-CM

## 2022-09-03 DIAGNOSIS — E782 Mixed hyperlipidemia: Secondary | ICD-10-CM

## 2022-09-03 DIAGNOSIS — N951 Menopausal and female climacteric states: Secondary | ICD-10-CM

## 2022-09-03 MED ORDER — VEOZAH 45 MG PO TABS: 45.0000 mg | ORAL_TABLET | Freq: Every day | ORAL | 0 refills | Status: DC

## 2022-09-03 NOTE — Progress Notes (Signed)
SUBJECTIVE:   Chief Complaint  Patient presents with   Medication Consultation    For Postmenopausal    HPI Patient presents to clinic to discuss menopausal symptoms. Having had hot flashes years.  Was previously on Premarin but was discontinued 15 years ago.  Since then worsening symptoms.  She has tried changing diet, and other conservative measures.  Requesting medications to help with vasomotor symptoms.   Hyperlipidemia Reviewed recent labs.  LDL elevated from 97-113.  Patient takes Mevacor 10 mg 3 times weekly.  Had previously had myalgias with taking daily.  She is willing to increase to 4 times weekly.  Hypertension/PSVT Asymptomatic.  Tolerating Bystolic 2.5 mg daily.  Has not had any further heart palpitations and has been doing well post ablation.  No longer follows with EP.  Sees cardiology at [facility].   PERTINENT PMH / PSH: Postmenopausal Hypertension Hyperlipidemia Aortic atherosclerosis PSVT   OBJECTIVE:  BP 122/72   Pulse 68   Temp 98.6 F (37 C)   Resp 16   Ht 5\' 2"  (1.575 m)   Wt 173 lb (78.5 kg)   SpO2 99%   BMI 31.64 kg/m    Physical Exam Vitals reviewed.  Constitutional:      General: She is not in acute distress.    Appearance: Normal appearance. She is normal weight. She is not ill-appearing, toxic-appearing or diaphoretic.  HENT:     Head: Normocephalic.  Eyes:     General:        Right eye: No discharge.        Left eye: No discharge.     Conjunctiva/sclera: Conjunctivae normal.  Cardiovascular:     Rate and Rhythm: Normal rate.  Pulmonary:     Effort: Pulmonary effort is normal.  Musculoskeletal:        General: Normal range of motion.  Skin:    General: Skin is warm and dry.  Neurological:     General: No focal deficit present.     Mental Status: She is alert and oriented to person, place, and time. Mental status is at baseline.  Psychiatric:        Mood and Affect: Mood normal.        Behavior: Behavior normal.         Thought Content: Thought content normal.        Judgment: Judgment normal.     ASSESSMENT/PLAN:  Vasomotor symptoms due to menopause Assessment & Plan: Chronic.  Progressively worsening symptoms for the past 15 years since discontinuation of Premarin.  Would prefer not to initiate HRT given age greater than 50 and more than 10 years since onset of symptoms.  Discussed initiation of NK3 inhibitors, nonhormonal therapy for vasomotor symptoms.  Patient agreeable to trial medication. No history of cirrhosis, elevated LFTs, declining renal function. Baseline LFTs today Start Veozha 45 mg daily Repeat LFTs at 3 months, 6 months, 9 months to ensure no elevation. Follow-up in 3 months.    Orders: Allyne Gee; Take 1 tablet (45 mg total) by mouth daily.  Dispense: 90 tablet; Refill: 0 -     Hepatic function panel  Aortic atherosclerosis (HCC) Assessment & Plan: Chronic.  LDL increased and not at goal less than 100 On statin therapy. Increase Mevacor 10 mg 4 times weekly   Primary hypertension Assessment & Plan: Chronic.  Asymptomatic. Continue Bystolic 2.5 mg daily    Paroxysmal supraventricular tachycardia Assessment & Plan: Chronic.  Asymptomatic. Status post ablation 5/21 Continue  Bystolic 2.5 mg daily Follows with cardiology   Mixed hyperlipidemia Assessment & Plan: Chronic.  Recent LDL elevated.  Takes statin 3 times weekly.  No myalgias.  Agreeable to trial increasing medication. Check CK today Increase Mevacor to 10 mg 4 days a week if tolerable can increase to 5 days a week after 4 weeks.  Increase by 1 day monthly if tolerable. Recheck at next visit  Orders: -     CK  HCM Medicare annual wellness visit due. Annual mammogram due 10/24.  Referral had been placed.  PDMP reviewed  Return in about 3 months (around 12/03/2022) for PCP.  Dana Allan, MD

## 2022-09-03 NOTE — Patient Instructions (Addendum)
It was a pleasure meeting you today. Thank you for allowing me to take part in your health care.  Our goals for today as we discussed include:  Start Veozah 45 mg daily  Increase Lovastatin to 5 times a day   Schedule appointment for blood work in 3 months, 6 months and 9 months   Follow up with PCP in 3 months  If you have any questions or concerns, please do not hesitate to call the office at [phone].  I look forward to our next visit and until then take care and stay safe.  Regards,   Dana Allan, MD   [facility]

## 2022-09-04 LAB — HEPATIC FUNCTION PANEL
ALT: 16 U/L (ref 0–35)
AST: 23 U/L (ref 0–37)
Albumin: 4.2 g/dL (ref 3.5–5.2)
Alkaline Phosphatase: 56 U/L (ref 39–117)
Bilirubin, Direct: 0.1 mg/dL (ref 0.0–0.3)
Total Bilirubin: 0.6 mg/dL (ref 0.2–1.2)
Total Protein: 6.5 g/dL (ref 6.0–8.3)

## 2022-09-04 LAB — CK: Total CK: 183 U/L — ABNORMAL HIGH (ref 7–177)

## 2022-09-13 ENCOUNTER — Encounter: Payer: Self-pay | Admitting: Family Medicine

## 2022-09-13 NOTE — Assessment & Plan Note (Signed)
Chronic.  LDL increased and not at goal less than 100 On statin therapy. Increase Mevacor 10 mg 4 times weekly

## 2022-09-13 NOTE — Assessment & Plan Note (Signed)
Chronic.  Recent LDL elevated.  Takes statin 3 times weekly.  No myalgias.  Agreeable to trial increasing medication. Check CK today Increase Mevacor to 10 mg 4 days a week if tolerable can increase to 5 days a week after 4 weeks.  Increase by 1 day monthly if tolerable. Recheck at next visit

## 2022-09-13 NOTE — Assessment & Plan Note (Addendum)
Chronic.  Progressively worsening symptoms for the past 15 years since discontinuation of Premarin.  Would prefer not to initiate HRT given age greater than 50 and more than 10 years since onset of symptoms.  Discussed initiation of NK3 inhibitors, nonhormonal therapy for vasomotor symptoms.  Patient agreeable to trial medication. No history of cirrhosis, elevated LFTs, declining renal function. Baseline LFTs today Start Veozha 45 mg daily Repeat LFTs at 3 months, 6 months, 9 months to ensure no elevation. Follow-up in 3 months.

## 2022-09-13 NOTE — Assessment & Plan Note (Signed)
Chronic.  Asymptomatic. Continue Bystolic 2.5 mg daily

## 2022-09-13 NOTE — Assessment & Plan Note (Signed)
Chronic.  Asymptomatic. Status post ablation 5/21 Continue Bystolic 2.5 mg daily Follows with cardiology

## 2022-09-23 ENCOUNTER — Telehealth: Payer: Self-pay | Admitting: Family Medicine

## 2022-09-23 NOTE — Telephone Encounter (Signed)
Contacted Kristy Mejia to schedule their annual wellness visit. Appointment made for 10/09/2022.  Kristy Mejia; Care Guide Ambulatory Clinical Support  l [facility] Direct Dial: [phone]

## 2022-09-30 ENCOUNTER — Telehealth: Payer: Self-pay

## 2022-09-30 NOTE — Telephone Encounter (Signed)
Patient Advocate Encounter  Received a fax from [facility] regarding Prior Authorization for [employer] .   Authorization has been DENIED due to    Determination letter attached to patient chart

## 2022-10-03 NOTE — Telephone Encounter (Signed)
Patient called and transferred to [facility].

## 2022-10-03 NOTE — Telephone Encounter (Signed)
Spoke to pt and she would just like more information on Celexa before she agrees to starting it.

## 2022-10-03 NOTE — Telephone Encounter (Signed)
LVM to call back to see if  patient  wants to trial Celexa.

## 2022-10-06 NOTE — Telephone Encounter (Signed)
Sent mychart message

## 2022-10-07 ENCOUNTER — Encounter: Payer: Medicare PPO | Admitting: *Deleted

## 2022-10-08 NOTE — Telephone Encounter (Signed)
Pt read Kristy Mejia's mychart msg:   Last read by Jeri Lager at  4:03 PM on 10/08/2022.

## 2022-10-09 ENCOUNTER — Ambulatory Visit (INDEPENDENT_AMBULATORY_CARE_PROVIDER_SITE_OTHER): Payer: Medicare PPO | Admitting: *Deleted

## 2022-10-09 DIAGNOSIS — Z0000 Encounter for general adult medical examination without abnormal findings: Secondary | ICD-10-CM | POA: Diagnosis not present

## 2022-10-09 NOTE — Progress Notes (Signed)
Subjective:   Kristy Mejia is a 77 y.o. female who presents for Medicare Annual (Subsequent) preventive examination.  I connected with  Miki S Meggison on 10/09/22 by a telephone enabled telemedicine application and verified that I am speaking with the correct person using two identifiers.   I discussed the limitations of evaluation and management by telemedicine. The patient expressed understanding and agreed to proceed.  Patient location: home  Provider location: telephone/home    Review of Systems     Cardiac Risk Factors include: advanced age (>16men, >71 women);family history of premature cardiovascular disease     Objective:    Today's Vitals   There is no height or weight on file to calculate BMI.     10/09/2022   10:07 AM 10/01/2020    1:02 PM 05/02/2020    4:11 PM 09/29/2019    1:08 PM 08/26/2019    6:25 PM 07/31/2019    1:44 PM 10/08/2018    9:30 AM  Advanced Directives  Does Patient Have a Medical Advance Directive? Yes Yes Yes Yes No No Yes  Type of Estate agent of [employer] Power of [place];Living will Healthcare Power of [place];Living will Living will;Healthcare Power of [employer] Power of [place];Living will  Does patient want to make changes to medical advance directive?  No - Patient declined  No - Patient declined   No - Patient declined  Copy of Healthcare Power of Attorney in Chart? Yes - validated most recent copy scanned in chart (See row information) Yes - validated most recent copy scanned in chart (See row information)  Yes - validated most recent copy scanned in chart (See row information)     Would patient like information on creating a medical advance directive?     No - Patient declined No - Patient declined     Current Medications (verified) Outpatient Encounter Medications as of 10/09/2022  Medication Sig   acetaminophen (TYLENOL) 325 MG tablet    aspirin EC 81 MG tablet Take 81 mg by mouth daily.    famotidine (PEPCID) 20 MG tablet Take 1 tablet by mouth daily.   lovastatin (MEVACOR) 10 MG tablet    nebivolol (BYSTOLIC) 2.5 MG tablet Take 1 tablet by mouth daily.   Fezolinetant (VEOZAH) 45 MG TABS Take 1 tablet (45 mg total) by mouth daily.   No facility-administered encounter medications on file as of 10/09/2022.    Allergies (verified) Metoprolol tartrate, Pravastatin, Diltiazem, Metoprolol succinate [metoprolol], and Tape   History: Past Medical History:  Diagnosis Date   Arthritis    Atypical chest pain    a. 04/2015 Myoview: EF 78%, breast attenuation, no ischemia-->Low risk.   Back pain    scoliosis   Chicken pox    Dysrhythmia    hx palpitations   GERD (gastroesophageal reflux disease)    History of hiatal hernia    noted on cxr   Hyperlipidemia    Joint pain    Obesity, unspecified    Paroxysmal SVT (supraventricular tachycardia)    a. 2013 Holter: PACs/PVCs; b. 10/2011 Ehco: EF nl, no rwma, mild LVH, mild TR, PASP ; c 04/2015 SVT in ED->resolved with adenosine; c.    Pneumonia 2010   hx of   Scoliosis    multiple areas of back follows with Dr. Dyke Maes chiropractor    Swine flu    2000s   Past Surgical History:  Procedure Laterality Date   ABDOMINAL HYSTERECTOMY  1990 ovaries intact. had 2/2 fibroids last pap 2004 neg.  ? if cervix present    BACK SURGERY  1960's   d/t scoliosis-1964/65?   BACK SURGERY     x 3    BREAST BIOPSY Left 2008   CORE W/CLIP - NEG   BREAST BIOPSY     2000    BREAST EXCISIONAL BIOPSY Right 20 + yrs ago   BREAST SURGERY  1990   excision breast mass   CARDIAC ELECTROPHYSIOLOGY STUDY AND ABLATION     10/12/19 for DVT [facility]   COLONOSCOPY  2007   Dr. Mechele Collin   COLONOSCOPY WITH ESOPHAGOGASTRODUODENOSCOPY (EGD) AND ESOPHAGEAL DILATION (ED)     COLONOSCOPY WITH PROPOFOL N/A 03/22/2018   Procedure: COLONOSCOPY WITH PROPOFOL;  Surgeon: Scot Jun, MD;  [facility];  Service: Endoscopy;  Laterality: N/A;    ESOPHAGOGASTRODUODENOSCOPY (EGD) WITH PROPOFOL N/A 03/22/2018   Procedure: ESOPHAGOGASTRODUODENOSCOPY (EGD) WITH PROPOFOL;  Surgeon: Scot Jun, MD;  Location: [facility] ENDOSCOPY;  Service: Endoscopy;  Laterality: N/A;   EYE SURGERY  2007   cataracts   FOOT SURGERY  1960's   JOINT REPLACEMENT     total hip left with metal hardward 2016 Dr. Alphonsa Gin ortho    OTHER SURGICAL HISTORY  2004   Bilateral cataracts   PARTIAL HYSTERECTOMY  1990   REVISION TOTAL HIP ARTHROPLASTY Left 04/17/2014   DR Turner Daniels   TOTAL HIP ARTHROPLASTY  2006   TOTAL HIP REVISION Left 04/17/2014   Procedure: LEFT TOTAL HIP REVISION;  Surgeon: Nestor Lewandowsky, MD;  Location: [facility] OR;  Service: Orthopedics;  Laterality: Left;   TOTAL KNEE ARTHROPLASTY Right 10/30/2017   Procedure: RIGHT TOTAL KNEE ARTHROPLASTY;  Surgeon: Gean Birchwood, MD;  Location: [facility] OR;  Service: Orthopedics;  Laterality: Right;   Family History  Problem Relation Age of Onset   Dementia Mother        dx early to mid 70s died 60   Cancer Mother        breast cancer   Breast cancer Mother 24   Arthritis Mother    Varicose Veins Mother    Heart attack Father    Heart disease Father        MI   Diabetes Father    Hypertension Father    Early death Father    Hyperlipidemia Father    Heart disease Brother    Hypertension Brother    Aneurysm Brother        cardiac    Early death Brother    Colon cancer Cousin    Social History   Socioeconomic History   Marital status: Married    Spouse name: Not on file   Number of children: Not on file   Years of education: Not on file   Highest education level: Master's degree (e.g., MA, MS, MEng, MEd, MSW, MBA)  Occupational History   Not on file  Tobacco Use   Smoking status: Never   Smokeless tobacco: Never  Vaping Use   Vaping Use: Never used  Substance and Sexual Activity   Alcohol use: Not Currently    Comment: rare   Drug use: No   Sexual activity: Yes    Birth  control/protection: Surgical  Other Topics Concern   Not on file  Social History Narrative   Married moved from [place]   No kids    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    Social Determinants of Health  Financial Resource Strain: Low Risk  (10/09/2022)   Overall Financial Resource Strain (CARDIA)    Difficulty of Paying Living Expenses: Not hard at all  Food Insecurity: No Food Insecurity (10/09/2022)   Hunger Vital Sign    Worried About Running Out of Food in the Last Year: Never true    Ran Out of Food in the Last Year: Never true  Transportation Needs: No Transportation Needs (10/09/2022)   PRAPARE - Administrator, Civil Service (Medical): No    Lack of Transportation (Non-Medical): No  Physical Activity: Inactive (10/09/2022)   Exercise Vital Sign    Days of Exercise per Week: 0 days    Minutes of Exercise per Session: 0 min  Stress: No Stress Concern Present (10/09/2022)   [employer] of Occupational Health - Occupational Stress Questionnaire    Feeling of Stress : Not at all  Social Connections: Moderately Integrated (10/09/2022)   Social Connection and Isolation Panel [NHANES]    Frequency of Communication with Friends and Family: More than three times a week    Frequency of Social Gatherings with Friends and Family: Three times a week    Attends Religious Services: Never    Active Member of Clubs or Organizations: Yes    Attends Engineer, structural: More than 4 times per year    Marital Status: Married    Tobacco Counseling Counseling given: Not Answered   Clinical Intake:  Pre-visit preparation completed: Yes  Pain : No/denies pain     Diabetes: No  How often do you need to have someone help you when you read instructions, pamphlets, or other written materials from your doctor or pharmacy?: 1 - Never  Diabetic?  no  Interpreter Needed?: No  Information entered by :: Remi Haggard LPN   Activities  of Daily Living    10/09/2022   10:08 AM 10/08/2022    4:14 PM  In your present state of health, do you have any difficulty performing the following activities:  Hearing? 0 0  Vision? 0 0  Difficulty concentrating or making decisions? 0 0  Walking or climbing stairs? 0 0  Dressing or bathing? 0 0  Doing errands, shopping? 0 0  Preparing Food and eating ? N N  Using the Toilet? N N  In the past six months, have you accidently leaked urine? N N  Do you have problems with loss of bowel control? N N  Managing your Medications? N N  Managing your Finances? N N  Housekeeping or managing your Housekeeping? N N    Patient Care Team: Dana Allan, MD as PCP - General (Family Medicine) Iran Ouch, MD as PCP - Cardiology (Cardiology) Kieth Brightly, MD (General Surgery)  Indicate any recent Medical Services you may have received from other than [facility] providers in the past year (date may be approximate).     Assessment:   This is a routine wellness examination for Kristy Mejia.  Hearing/Vision screen Hearing Screening - Comments:: No trouble hearing Vision Screening - Comments:: Not up to date [facility]  Dietary issues and exercise activities discussed: Current Exercise Habits: The patient does not participate in regular exercise at present   Goals Addressed             This Visit's Progress    Increase physical activity       cardio       Depression Screen    10/09/2022   10:13 AM 09/03/2022    2:42 PM 08/04/2022  11:12 AM 02/04/2022    8:26 AM 10/02/2021    2:38 PM 04/04/2021    8:56 AM 10/01/2020   12:54 PM  PHQ 2/9 Scores  PHQ - 2 Score 0 0 0 0 0 0 0  PHQ- 9 Score 0 0         Fall Risk    10/09/2022   10:06 AM 10/08/2022    4:14 PM 09/03/2022    2:41 PM 08/04/2022   11:12 AM 02/04/2022    8:26 AM  Fall Risk   Falls in the past year? 1 1 0 1 0  Number falls in past yr: 0 0 0 0 0  Injury with Fall? 0 0 0 0 0  Risk for fall due to :   No Fall Risks No Fall  Risks No Fall Risks  Follow up Falls evaluation completed;Education provided;Falls prevention discussed  Falls evaluation completed Falls evaluation completed Falls evaluation completed    FALL RISK PREVENTION PERTAINING TO THE HOME:  Any stairs in or around the home? Yes  If so, are there any without handrails? No  Home free of loose throw rugs in walkways, pet beds, electrical cords, etc? Yes  Adequate lighting in your home to reduce risk of falls? Yes   ASSISTIVE DEVICES UTILIZED TO PREVENT FALLS:  Life alert? No  Use of a cane, walker or w/c? No  Grab bars in the bathroom? Yes  Shower chair or bench in shower? Yes  Elevated toilet seat or a handicapped toilet? Yes   TIMED UP AND GO:  Was the test performed? No .    Cognitive Function:    09/24/2017    3:53 PM  MMSE - Mini Mental State Exam  Orientation to time 5  Orientation to Place 5  Registration 3  Attention/ Calculation 5  Recall 3  Language- name 2 objects 2  Language- repeat 1  Language- follow 3 step command 3  Language- read & follow direction 1  Write a sentence 1  Copy design 1  Total score 30        10/09/2022   10:09 AM 09/29/2019    1:24 PM 09/28/2018    3:26 PM  6CIT Screen  What Year? 0 points 0 points 0 points  What month? 0 points 0 points 0 points  What time? 0 points 0 points 0 points  Count back from 20 0 points  0 points  Months in reverse 0 points 0 points 0 points  Repeat phrase 0 points  0 points  Total Score 0 points  0 points    Immunizations Immunization History  Administered Date(s) Administered   COVID-19, mRNA, vaccine(Comirnaty)12 years and older 04/08/2022   Fluad Quad(high Dose 65+) 02/18/2022   Moderna SARS-COV2 Booster Vaccination 09/03/2020, 02/04/2021   Moderna Sars-Covid-2 Vaccination 06/30/2019, 07/28/2019, 03/29/2020   PNEUMOCOCCAL CONJUGATE-20 02/04/2022   Tdap 09/30/2019    TDAP status: Up to date  Flu Vaccine status: Up to date  Pneumococcal vaccine  status: Up to date  Covid-19 vaccine status: Information provided on how to obtain vaccines.   Qualifies for Shingles Vaccine? Yes   Zostavax completed No   Shingrix Completed?: No.    Education has been provided regarding the importance of this vaccine. Patient has been advised to call insurance company to determine out of pocket expense if they have not yet received this vaccine. Advised may also receive vaccine at local pharmacy or Health Dept. Verbalized acceptance and understanding.  Screening Tests Health Maintenance  Topic Date Due   COVID-19 Vaccine (5 - 2023-24 season) 06/03/2022   INFLUENZA VACCINE  12/18/2022   MAMMOGRAM  02/20/2023   Medicare Annual Wellness (AWV)  10/09/2023   COLONOSCOPY (Pts 45-22yrs Insurance coverage will need to be confirmed)  03/22/2028   DTaP/Tdap/Td (2 - Td or Tdap) 09/29/2029   Pneumonia Vaccine 39+ Years old  Completed   DEXA SCAN  Completed   Hepatitis C Screening  Completed   HPV VACCINES  Aged Out   Zoster Vaccines- Shingrix  Discontinued    Health Maintenance  Health Maintenance Due  Topic Date Due   COVID-19 Vaccine (5 - 2023-24 season) 06/03/2022    Colorectal cancer screening: Type of screening: Colonoscopy. Completed 2019. Repeat every 10 years  Mammogram status: Completed  . Repeat every year  Bone Density status: Completed 2022. Results reflect: Bone density results: NORMAL. Repeat every 5 years.  Lung Cancer Screening: (Low Dose CT Chest recommended if Age 74-80 years, 30 pack-year currently smoking OR have quit w/in 15years.) does not qualify.   Lung Cancer Screening Referral:   Additional Screening:  Hepatitis C Screening: does not qualify; Completed 2018  Vision Screening: Recommended annual ophthalmology exams for early detection of glaucoma and other disorders of the eye. Is the patient up to date with their annual eye exam?  No  Who is the provider or what is the name of the office in which the patient attends  annual eye exams? [facility] If pt is not established with a provider, would they like to be referred to a provider to establish care? No .   Dental Screening: Recommended annual dental exams for proper oral hygiene  Community Resource Referral / Chronic Care Management: CRR required this visit?  No   CCM required this visit?  No      Plan:     I have personally reviewed and noted the following in the patient's chart:   Medical and social history Use of alcohol, tobacco or illicit drugs  Current medications and supplements including opioid prescriptions. Patient is not currently taking opioid prescriptions. Functional ability and status Nutritional status Physical activity Advanced directives List of other physicians Hospitalizations, surgeries, and ER visits in previous 12 months Vitals Screenings to include cognitive, depression, and falls Referrals and appointments  In addition, I have reviewed and discussed with patient certain preventive protocols, quality metrics, and best practice recommendations. A written personalized care plan for preventive services as well as general preventive health recommendations were provided to patient.     Remi Haggard, LPN   [id number]/61/0960   Nurse Notes: .

## 2022-10-09 NOTE — Patient Instructions (Signed)
Kristy Mejia , Thank you for taking time to come for your Medicare Wellness Visit. I appreciate your ongoing commitment to your health goals. Please review the following plan we discussed and let me know if I can assist you in the future.   Screening recommendations/referrals: Colonoscopy: up to date Mammogram: up to date Bone Density: up to date Recommended yearly ophthalmology/optometry visit for glaucoma screening and checkup Recommended yearly dental visit for hygiene and checkup  Vaccinations: Influenza vaccine: up to date Pneumococcal vaccine: up tod ate Tdap vaccine: up to date Shingles vaccine: Education provided    Advanced directives:on file     Preventive Care 65 Years and Older, Female Preventive care refers to lifestyle choices and visits with your health care provider that can promote health and wellness. What does preventive care include? A yearly physical exam. This is also called an annual well check. Dental exams once or twice a year. Routine eye exams. Ask your health care provider how often you should have your eyes checked. Personal lifestyle choices, including: Daily care of your teeth and gums. Regular physical activity. Eating a healthy diet. Avoiding tobacco and drug use. Limiting alcohol use. Practicing safe sex. Taking low-dose aspirin every day. Taking vitamin and mineral supplements as recommended by your health care provider. What happens during an annual well check? The services and screenings done by your health care provider during your annual well check will depend on your age, overall health, lifestyle risk factors, and family history of disease. Counseling  Your health care provider may ask you questions about your: Alcohol use. Tobacco use. Drug use. Emotional well-being. Home and relationship well-being. Sexual activity. Eating habits. History of falls. Memory and ability to understand (cognition). Work and work  Astronomer. Reproductive health. Screening  You may have the following tests or measurements: Height, weight, and BMI. Blood pressure. Lipid and cholesterol levels. These may be checked every 5 years, or more frequently if you are over 69 years old. Skin check. Lung cancer screening. You may have this screening every year starting at age 75 if you have a 30-pack-year history of smoking and currently smoke or have quit within the past 15 years. Fecal occult blood test (FOBT) of the stool. You may have this test every year starting at age 75. Flexible sigmoidoscopy or colonoscopy. You may have a sigmoidoscopy every 5 years or a colonoscopy every 10 years starting at age 65. Hepatitis C blood test. Hepatitis B blood test. Sexually transmitted disease (STD) testing. Diabetes screening. This is done by checking your blood sugar (glucose) after you have not eaten for a while (fasting). You may have this done every 75-3 years. Bone density scan. This is done to screen for osteoporosis. You may have this done starting at age 75. Mammogram. This may be done every 1-2 years. Talk to your health care provider about how often you should have regular mammograms. Talk with your health care provider about your test results, treatment options, and if necessary, the need for more tests. Vaccines  Your health care provider may recommend certain vaccines, such as: Influenza vaccine. This is recommended every year. Tetanus, diphtheria, and acellular pertussis (Tdap, Td) vaccine. You may need a Td booster every 10 years. Zoster vaccine. You may need this after age 55. Pneumococcal 13-valent conjugate (PCV13) vaccine. One dose is recommended after age 75. Pneumococcal polysaccharide (PPSV23) vaccine. One dose is recommended after age 67. Talk to your health care provider about which screenings and vaccines you need and how often  you need them. This information is not intended to replace advice given to you by  your health care provider. Make sure you discuss any questions you have with your health care provider. Document Released: 06/01/2015 Document Revised: 01/23/2016 Document Reviewed: 03/06/2015 Elsevier Interactive Patient Education  2017 [employer].  Fall Prevention in the Home Falls can cause injuries. They can happen to people of all ages. There are many things you can do to make your home safe and to help prevent falls. What can I do on the outside of my home? Regularly fix the edges of walkways and driveways and fix any cracks. Remove anything that might make you trip as you walk through a door, such as a raised step or threshold. Trim any bushes or trees on the path to your home. Use bright outdoor lighting. Clear any walking paths of anything that might make someone trip, such as rocks or tools. Regularly check to see if handrails are loose or broken. Make sure that both sides of any steps have handrails. Any raised decks and porches should have guardrails on the edges. Have any leaves, snow, or ice cleared regularly. Use sand or salt on walking paths during winter. Clean up any spills in your garage right away. This includes oil or grease spills. What can I do in the bathroom? Use night lights. Install grab bars by the toilet and in the tub and shower. Do not use towel bars as grab bars. Use non-skid mats or decals in the tub or shower. If you need to sit down in the shower, use a plastic, non-slip stool. Keep the floor dry. Clean up any water that spills on the floor as soon as it happens. Remove soap buildup in the tub or shower regularly. Attach bath mats securely with double-sided non-slip rug tape. Do not have throw rugs and other things on the floor that can make you trip. What can I do in the bedroom? Use night lights. Make sure that you have a light by your bed that is easy to reach. Do not use any sheets or blankets that are too big for your bed. They should not hang  down onto the floor. Have a firm chair that has side arms. You can use this for support while you get dressed. Do not have throw rugs and other things on the floor that can make you trip. What can I do in the kitchen? Clean up any spills right away. Avoid walking on wet floors. Keep items that you use a lot in easy-to-reach places. If you need to reach something above you, use a strong step stool that has a grab bar. Keep electrical cords out of the way. Do not use floor polish or wax that makes floors slippery. If you must use wax, use non-skid floor wax. Do not have throw rugs and other things on the floor that can make you trip. What can I do with my stairs? Do not leave any items on the stairs. Make sure that there are handrails on both sides of the stairs and use them. Fix handrails that are broken or loose. Make sure that handrails are as long as the stairways. Check any carpeting to make sure that it is firmly attached to the stairs. Fix any carpet that is loose or worn. Avoid having throw rugs at the top or bottom of the stairs. If you do have throw rugs, attach them to the floor with carpet tape. Make sure that you have a light  switch at the top of the stairs and the bottom of the stairs. If you do not have them, ask someone to add them for you. What else can I do to help prevent falls? Wear shoes that: Do not have high heels. Have rubber bottoms. Are comfortable and fit you well. Are closed at the toe. Do not wear sandals. If you use a stepladder: Make sure that it is fully opened. Do not climb a closed stepladder. Make sure that both sides of the stepladder are locked into place. Ask someone to hold it for you, if possible. Clearly mark and make sure that you can see: Any grab bars or handrails. First and last steps. Where the edge of each step is. Use tools that help you move around (mobility aids) if they are needed. These  include: Canes. Walkers. Scooters. Crutches. Turn on the lights when you go into a dark area. Replace any light bulbs as soon as they burn out. Set up your furniture so you have a clear path. Avoid moving your furniture around. If any of your floors are uneven, fix them. If there are any pets around you, be aware of where they are. Review your medicines with your doctor. Some medicines can make you feel dizzy. This can increase your chance of falling. Ask your doctor what other things that you can do to help prevent falls. This information is not intended to replace advice given to you by your health care provider. Make sure you discuss any questions you have with your health care provider. Document Released: 03/01/2009 Document Revised: 10/11/2015 Document Reviewed: 06/09/2014 Elsevier Interactive Patient Education  2017 [employer].

## 2022-11-17 ENCOUNTER — Telehealth: Payer: Self-pay | Admitting: Family Medicine

## 2022-11-17 NOTE — Telephone Encounter (Signed)
Patient need orders  °

## 2022-11-28 ENCOUNTER — Other Ambulatory Visit: Payer: Medicare PPO

## 2022-12-03 ENCOUNTER — Ambulatory Visit: Payer: Medicare PPO | Admitting: Family Medicine

## 2023-02-23 ENCOUNTER — Ambulatory Visit
Admission: RE | Admit: 2023-02-23 | Discharge: 2023-02-23 | Disposition: A | Discharge status: Home / Self Care | Payer: Medicare PPO | Source: Ambulatory Visit | Attending: Family Medicine | Admitting: Family Medicine

## 2023-02-23 DIAGNOSIS — Z1231 Encounter for screening mammogram for malignant neoplasm of breast: Secondary | ICD-10-CM | POA: Diagnosis not present

## 2023-03-05 ENCOUNTER — Other Ambulatory Visit: Payer: Medicare PPO

## 2023-03-09 ENCOUNTER — Encounter: Payer: Self-pay | Admitting: Family Medicine

## 2023-03-09 ENCOUNTER — Ambulatory Visit: Payer: Medicare PPO | Admitting: Family Medicine

## 2023-03-09 VITALS — BP 126/72 | HR 66 | Temp 98.0°F | Resp 16 | Ht 62.0 in | Wt 168.4 lb

## 2023-03-09 DIAGNOSIS — E782 Mixed hyperlipidemia: Secondary | ICD-10-CM

## 2023-03-09 DIAGNOSIS — M65321 Trigger finger, right index finger: Secondary | ICD-10-CM

## 2023-03-09 DIAGNOSIS — F39 Unspecified mood [affective] disorder: Secondary | ICD-10-CM | POA: Diagnosis not present

## 2023-03-09 DIAGNOSIS — M1711 Unilateral primary osteoarthritis, right knee: Secondary | ICD-10-CM

## 2023-03-09 DIAGNOSIS — N951 Menopausal and female climacteric states: Secondary | ICD-10-CM | POA: Diagnosis not present

## 2023-03-09 DIAGNOSIS — I10 Essential (primary) hypertension: Secondary | ICD-10-CM | POA: Diagnosis not present

## 2023-03-09 DIAGNOSIS — K449 Diaphragmatic hernia without obstruction or gangrene: Secondary | ICD-10-CM | POA: Diagnosis not present

## 2023-03-09 DIAGNOSIS — Z23 Encounter for immunization: Secondary | ICD-10-CM | POA: Diagnosis not present

## 2023-03-09 DIAGNOSIS — M25562 Pain in left knee: Secondary | ICD-10-CM

## 2023-03-09 DIAGNOSIS — G8929 Other chronic pain: Secondary | ICD-10-CM

## 2023-03-09 DIAGNOSIS — M25561 Pain in right knee: Secondary | ICD-10-CM

## 2023-03-09 LAB — LIPID PANEL
Cholesterol: 193 mg/dL (ref 0–200)
HDL: 67 mg/dL (ref >39.00)
LDL Cholesterol: 111 mg/dL — ABNORMAL HIGH (ref 0–99)
NonHDL: 125.88
Total CHOL/HDL Ratio: 3
Triglycerides: 72 mg/dL (ref 0.0–149.0)
VLDL: 14.4 mg/dL (ref 0.0–40.0)

## 2023-03-09 MED ORDER — FAMOTIDINE 20 MG PO TABS: 20.0000 mg | ORAL_TABLET | Freq: Every day | ORAL | 3 refills | Status: DC

## 2023-03-09 MED ORDER — MELOXICAM 7.5 MG PO TABS: 7.5000 mg | ORAL_TABLET | Freq: Every day | ORAL | Status: DC

## 2023-03-09 MED ORDER — LOVASTATIN 10 MG PO TABS: 10.0000 mg | ORAL_TABLET | Freq: Every day | ORAL | 3 refills | Status: DC

## 2023-03-09 NOTE — Assessment & Plan Note (Signed)
Persistent hot flashes and cyclical anxiety symptoms. Discussed potential benefits of low-dose Paxil for both symptoms. Patient considering consultation with menopause specialist. -Consider starting low-dose Paxil for hot flashes and anxiety symptoms. -Encourage consultation with menopause specialist at [facility].

## 2023-03-09 NOTE — Assessment & Plan Note (Addendum)
Cyclical anxiety symptoms with vivid dreams and fearfulness. Discussed potential benefits of therapy and low-dose Paxil. -Consider starting therapy, provided resources for online therapy options. -Consider starting low-dose Paxil. -Mental health resources provided

## 2023-03-09 NOTE — Patient Instructions (Addendum)
It was a pleasure meeting you today. Thank you for allowing me to take part in your health care.  Our goals for today as we discussed include:  Received flu vaccine today  Referral sent to Orthopedics for evaluation of trigger finger  Follow up with Gynecology for Menopausal symptoms.  If need referral please let me know and would be happy to send  [facility] counseling and psychiatry [facility]  [address]  [place] [address] [phone]    [email or web address] [address] [place] [phone]   Can try to schedule therapy on one of these sites.  If not I am happy to send in referral for therapist  [email or web address]  [email or web address]  We will get some labs today.  If they are abnormal or we need to do something about them, I will call you.  If they are normal, I will send you a message on MyChart (if it is active) or a letter in the mail.  If you don't hear from [place] in 2 weeks, please call the office at the number below.    This is a list of the screening recommended for you and due dates:  Health Maintenance  Topic Date Due   COVID-19 Vaccine (5 - 2023-24 season) 01/18/2023   Medicare Annual Wellness Visit  10/09/2023   Mammogram  02/23/2024   Colon Cancer Screening  03/22/2028   DTaP/Tdap/Td vaccine (2 - Td or Tdap) 09/29/2029   Pneumonia Vaccine  Completed   Flu Shot  Completed   DEXA scan (bone density measurement)  Completed   Hepatitis C Screening  Completed   HPV Vaccine  Aged Out   Zoster (Shingles) Vaccine  Discontinued     Follow up as needed  If you have any questions or concerns, please do not hesitate to call the office at [phone].  I look forward to our next visit and until then take care and stay safe.  Regards,   Dana Allan, MD   [facility]

## 2023-03-09 NOTE — Progress Notes (Unsigned)
SUBJECTIVE:   Chief Complaint  Patient presents with  . Medical Management of Chronic Issues   HPI Presents for chronic disease management follow up  Discussed the use of AI scribe software for clinical note transcription with the patient, who gave verbal consent to proceed.  History of Present Illness   The patient, with a history of hyperlipidemia and hypertension, presents with concerns about menopausal symptoms, specifically hot flashes and vivid dreams. The patient reports that these symptoms have been ongoing for approximately 20 years. Recently, the patient has noticed a pattern of vivid dreams occurring every 4-5 weeks, which cause fear and anxiety. The patient is unsure if these symptoms are related to menopause and is considering seeing a menopause specialist for further evaluation.  In addition to the menopausal symptoms, the patient also reports arthritis in the knees and hips. The patient has been taking meloxicam for the arthritis, but reports that Tylenol is not as effective. The patient also mentions a possible trigger finger in the right hand, which is causing discomfort and difficulty straightening the finger.  The patient's hyperlipidemia is managed with lovastatin, which is taken three to four times a day. The patient reports that increasing the dosage to five times a day caused anxiety, so the current regimen seems to be well-tolerated. The patient's hypertension is managed with nebivolol, which is also well-tolerated.      PERTINENT PMH / PSH: As above  OBJECTIVE:  BP 126/72   Pulse 66   Temp 98 F (36.7 C)   Resp 16   Ht 5\' 2"  (1.575 m)   Wt 168 lb 6 oz (76.4 kg)   SpO2 97%   BMI 30.80 kg/m    Physical Exam Vitals reviewed.  Constitutional:      General: She is not in acute distress.    Appearance: Normal appearance. She is obese. She is not ill-appearing, toxic-appearing or diaphoretic.  Eyes:     General:        Right eye: No discharge.         Left eye: No discharge.     Conjunctiva/sclera: Conjunctivae normal.  Cardiovascular:     Rate and Rhythm: Normal rate and regular rhythm.     Heart sounds: Normal heart sounds.  Pulmonary:     Effort: Pulmonary effort is normal.     Breath sounds: Normal breath sounds.  Abdominal:     General: Bowel sounds are normal.  Musculoskeletal:        General: Normal range of motion.  Skin:    General: Skin is warm and dry.  Neurological:     General: No focal deficit present.     Mental Status: She is alert and oriented to person, place, and time. Mental status is at baseline.  Psychiatric:        Mood and Affect: Mood normal.        Behavior: Behavior normal.        Thought Content: Thought content normal.        Judgment: Judgment normal.       03/09/2023   12:53 PM 10/09/2022   10:13 AM 09/03/2022    2:42 PM 08/04/2022   11:12 AM 02/04/2022    8:26 AM  Depression screen PHQ 2/9  Decreased Interest 0 0 0 0 0  Down, Depressed, Hopeless 0 0 0 0 0  PHQ - 2 Score 0 0 0 0 0  Altered sleeping 0 0 0    Tired, decreased energy 0 0  0    Change in appetite 0 0 0    Feeling bad or failure about yourself  0 0 0    Trouble concentrating 0 0 0    Moving slowly or fidgety/restless 0 0 0    Suicidal thoughts 0 0 0    PHQ-9 Score 0 0 0    Difficult doing work/chores Not difficult at all Not difficult at all Not difficult at all        03/09/2023   12:53 PM 09/03/2022    2:42 PM 08/04/2022   11:12 AM 09/30/2019    1:33 PM  GAD 7 : Generalized Anxiety Score  Nervous, Anxious, on Edge 0 0 0 0  Control/stop worrying 0 0 0 0  Worry too much - different things 0 0 0 0  Trouble relaxing 0 0 0 0  Restless 0 0 0 0  Easily annoyed or irritable 0 0 0 0  Afraid - awful might happen 0 0 0 0  Total GAD 7 Score 0 0 0 0  Anxiety Difficulty Not difficult at all Not difficult at all Not difficult at all Not difficult at all    ASSESSMENT/PLAN:  Need for influenza vaccination -     Flu Vaccine  Trivalent High Dose (Fluad)  Mixed hyperlipidemia -     Lipid panel -     Lovastatin; Take 1 tablet (10 mg total) by mouth at bedtime.  Dispense: 90 tablet; Refill: 3  Hiatal hernia -     Famotidine; Take 1 tablet (20 mg total) by mouth daily.  Dispense: 90 tablet; Refill: 3  Primary osteoarthritis of right knee -     Meloxicam; Take 1 tablet (7.5 mg total) by mouth daily.  Trigger index finger of right hand -     Ambulatory referral to Orthopedics   Assessment and Plan    Menopausal Symptoms   Anxiety   Hyperlipidemia Patient taking lovastatin 3-4 times a week. Discussed potential correlation between increased lovastatin use and anxiety symptoms. -Continue lovastatin 3-4 times a week. -Check cholesterol levels today.  Arthritis Patient experiencing pain managed with intermittent meloxicam use. Discussed potential GI side effects with prolonged use. -Continue intermittent use of meloxicam 7.5mg  for arthritis pain. -Refill Pepcid or similar medication for stomach protection if using meloxicam for 3-4 consecutive days.  Trigger Finger Right ring finger with symptoms suggestive of trigger finger. -Refer to orthopedics for evaluation and potential steroid injection.  General Health Maintenance -Recommend RSV vaccine .    PDMP reviewed  Return if symptoms worsen or fail to improve, for PCP.  Dana Allan, MD

## 2023-03-11 ENCOUNTER — Encounter: Payer: Self-pay | Admitting: Family Medicine

## 2023-03-11 DIAGNOSIS — Z23 Encounter for immunization: Secondary | ICD-10-CM | POA: Insufficient documentation

## 2023-03-11 DIAGNOSIS — M65321 Trigger finger, right index finger: Secondary | ICD-10-CM | POA: Insufficient documentation

## 2023-03-11 NOTE — Assessment & Plan Note (Signed)
Patient taking lovastatin 3-4 times a week. Discussed potential correlation between increased lovastatin use and anxiety symptoms. -Continue lovastatin 3-4 times a week. -Check cholesterol levels today.

## 2023-03-11 NOTE — Assessment & Plan Note (Signed)
Patient experiencing pain managed with intermittent meloxicam use. Discussed potential GI side effects with prolonged use. -Continue intermittent use of meloxicam 7.5mg  for arthritis pain. -Refill Pepcid or similar medication for stomach protection if using meloxicam for 3-4 consecutive days.

## 2023-03-11 NOTE — Assessment & Plan Note (Signed)
Managed with current BB Continue Bystolic 2.5 mg daily

## 2023-03-11 NOTE — Assessment & Plan Note (Signed)
Right ring finger with symptoms suggestive of trigger finger. -Refer to orthopedics for evaluation and potential steroid injection.

## 2023-03-16 DIAGNOSIS — Z961 Presence of intraocular lens: Secondary | ICD-10-CM | POA: Diagnosis not present

## 2023-03-16 DIAGNOSIS — H526 Other disorders of refraction: Secondary | ICD-10-CM | POA: Diagnosis not present

## 2023-03-31 DIAGNOSIS — M65341 Trigger finger, right ring finger: Secondary | ICD-10-CM | POA: Insufficient documentation

## 2023-04-13 ENCOUNTER — Other Ambulatory Visit: Payer: Self-pay | Admitting: Family Medicine

## 2023-04-13 NOTE — Telephone Encounter (Signed)
Patient called about her nebivolol medication. She is out and really needs her medication.

## 2023-04-13 NOTE — Telephone Encounter (Signed)
Called pt to find out what medication she needed refilled nebivolol 2.5mg .  Prescription is pended for Dr. Claris Che approval.

## 2023-04-13 NOTE — Telephone Encounter (Signed)
Patient just called and said she needs a refill on her medication. She said her pharmacy told her they are waiting for approval from her provider. The pharmacy she use is [id number] [place], [place] - [address] [place], [place] [address] Phone: [phone]  Fax: [phone]  Her number is [phone].

## 2023-04-15 MED ORDER — NEBIVOLOL HCL 2.5 MG PO TABS: 2.5000 mg | ORAL_TABLET | Freq: Every day | ORAL | 3 refills | Status: DC

## 2023-06-03 DIAGNOSIS — U071 COVID-19: Secondary | ICD-10-CM | POA: Diagnosis not present

## 2023-06-03 DIAGNOSIS — R058 Other specified cough: Secondary | ICD-10-CM | POA: Diagnosis not present

## 2023-06-03 DIAGNOSIS — R0982 Postnasal drip: Secondary | ICD-10-CM | POA: Diagnosis not present

## 2023-06-03 DIAGNOSIS — Z03818 Encounter for observation for suspected exposure to other biological agents ruled out: Secondary | ICD-10-CM | POA: Diagnosis not present

## 2023-06-05 ENCOUNTER — Other Ambulatory Visit: Payer: Medicare PPO

## 2023-07-20 DIAGNOSIS — R0602 Shortness of breath: Secondary | ICD-10-CM | POA: Diagnosis not present

## 2023-07-20 DIAGNOSIS — I2510 Atherosclerotic heart disease of native coronary artery without angina pectoris: Secondary | ICD-10-CM | POA: Diagnosis not present

## 2023-07-20 DIAGNOSIS — R079 Chest pain, unspecified: Secondary | ICD-10-CM | POA: Diagnosis not present

## 2023-07-20 DIAGNOSIS — R011 Cardiac murmur, unspecified: Secondary | ICD-10-CM | POA: Diagnosis not present

## 2023-07-20 DIAGNOSIS — E669 Obesity, unspecified: Secondary | ICD-10-CM | POA: Diagnosis not present

## 2023-07-20 DIAGNOSIS — R7303 Prediabetes: Secondary | ICD-10-CM | POA: Diagnosis not present

## 2023-07-20 DIAGNOSIS — I10 Essential (primary) hypertension: Secondary | ICD-10-CM | POA: Diagnosis not present

## 2023-07-20 DIAGNOSIS — I471 Supraventricular tachycardia, unspecified: Secondary | ICD-10-CM | POA: Diagnosis not present

## 2023-07-20 DIAGNOSIS — Z9889 Other specified postprocedural states: Secondary | ICD-10-CM | POA: Diagnosis not present

## 2023-07-21 ENCOUNTER — Other Ambulatory Visit: Payer: Self-pay | Admitting: Internal Medicine

## 2023-07-21 DIAGNOSIS — R0602 Shortness of breath: Secondary | ICD-10-CM

## 2023-07-21 DIAGNOSIS — R079 Chest pain, unspecified: Secondary | ICD-10-CM

## 2023-07-29 ENCOUNTER — Ambulatory Visit
Admission: RE | Admit: 2023-07-29 | Discharge: 2023-07-29 | Disposition: A | Discharge status: Home / Self Care | Payer: Self-pay | Source: Ambulatory Visit | Attending: Internal Medicine | Admitting: Internal Medicine

## 2023-07-29 DIAGNOSIS — R0602 Shortness of breath: Secondary | ICD-10-CM

## 2023-07-29 DIAGNOSIS — R079 Chest pain, unspecified: Secondary | ICD-10-CM

## 2023-08-04 DIAGNOSIS — R0789 Other chest pain: Secondary | ICD-10-CM | POA: Diagnosis not present

## 2023-08-04 DIAGNOSIS — R0602 Shortness of breath: Secondary | ICD-10-CM | POA: Diagnosis not present

## 2023-08-17 DIAGNOSIS — I471 Supraventricular tachycardia, unspecified: Secondary | ICD-10-CM | POA: Diagnosis not present

## 2023-08-17 DIAGNOSIS — R011 Cardiac murmur, unspecified: Secondary | ICD-10-CM | POA: Diagnosis not present

## 2023-08-17 DIAGNOSIS — I10 Essential (primary) hypertension: Secondary | ICD-10-CM | POA: Diagnosis not present

## 2023-08-17 DIAGNOSIS — E66811 Obesity, class 1: Secondary | ICD-10-CM | POA: Diagnosis not present

## 2023-08-17 DIAGNOSIS — R7303 Prediabetes: Secondary | ICD-10-CM | POA: Diagnosis not present

## 2023-08-17 DIAGNOSIS — Z9889 Other specified postprocedural states: Secondary | ICD-10-CM | POA: Diagnosis not present

## 2023-08-17 DIAGNOSIS — I2510 Atherosclerotic heart disease of native coronary artery without angina pectoris: Secondary | ICD-10-CM | POA: Diagnosis not present

## 2023-08-17 DIAGNOSIS — R079 Chest pain, unspecified: Secondary | ICD-10-CM | POA: Diagnosis not present

## 2023-08-17 DIAGNOSIS — R0602 Shortness of breath: Secondary | ICD-10-CM | POA: Diagnosis not present

## 2023-09-09 DIAGNOSIS — K449 Diaphragmatic hernia without obstruction or gangrene: Secondary | ICD-10-CM | POA: Diagnosis not present

## 2023-09-09 DIAGNOSIS — R0609 Other forms of dyspnea: Secondary | ICD-10-CM | POA: Diagnosis not present

## 2023-09-09 DIAGNOSIS — R0602 Shortness of breath: Secondary | ICD-10-CM | POA: Diagnosis not present

## 2023-09-09 DIAGNOSIS — K219 Gastro-esophageal reflux disease without esophagitis: Secondary | ICD-10-CM | POA: Diagnosis not present

## 2023-09-16 ENCOUNTER — Ambulatory Visit: Admitting: Surgery

## 2023-09-16 ENCOUNTER — Encounter: Payer: Self-pay | Admitting: Surgery

## 2023-09-16 VITALS — BP 146/81 | HR 76 | Temp 98.1°F | Ht 62.0 in | Wt 167.0 lb

## 2023-09-16 DIAGNOSIS — K219 Gastro-esophageal reflux disease without esophagitis: Secondary | ICD-10-CM

## 2023-09-16 DIAGNOSIS — R1310 Dysphagia, unspecified: Secondary | ICD-10-CM

## 2023-09-16 DIAGNOSIS — K449 Diaphragmatic hernia without obstruction or gangrene: Secondary | ICD-10-CM | POA: Diagnosis not present

## 2023-09-16 NOTE — Patient Instructions (Addendum)
 Your CT and Barium Swallow is scheduled for 09/22/2023 9 am (arrive by 8:45 am) at [facility].    Hiatal Hernia   A hiatal hernia occurs when part of the stomach slides above the muscle that separates the abdomen from the chest (diaphragm). A person can be born with a hiatal hernia (congenital), or it may develop over time. In almost all cases of hiatal hernia, only the top part of the stomach pushes through the diaphragm. Many people have a hiatal hernia with no symptoms. The larger the hernia, the more likely it is that you will have symptoms. In some cases, a hiatal hernia allows stomach acid to flow back into the tube that carries food from your mouth to your stomach (esophagus). This may cause heartburn symptoms. The development of heartburn symptoms may mean that you have a condition called gastroesophageal reflux disease (GERD). What are the causes? This condition is caused by a weakness in the opening (hiatus) where the esophagus passes through the diaphragm to attach to the upper part of the stomach. A person may be born with a weakness in the hiatus, or a weakness can develop over time. What increases the risk? This condition is more likely to develop in: Older people. Age is a major risk factor for a hiatal hernia, especially if you are over the age of 77. Pregnant women. People who are overweight. People who have frequent constipation. What are the signs or symptoms? Symptoms of this condition usually develop in the form of GERD symptoms. Symptoms include: Heartburn. Upset stomach (indigestion). Trouble swallowing. Coughing or wheezing. Wheezing is making high-pitched whistling sounds when you breathe. Sore throat. Chest pain. Nausea and vomiting. How is this diagnosed? This condition may be diagnosed during testing for GERD. Tests that may be done include: X-rays of your stomach or chest. An upper gastrointestinal (GI) series. This is an X-ray exam of your GI tract that  is taken after you swallow a chalky liquid that shows up clearly on the X-ray. Endoscopy. This is a procedure to look into your stomach using a thin, flexible tube that has a tiny camera and light on the end of it. How is this treated? This condition may be treated by: Dietary and lifestyle changes to help reduce GERD symptoms. Medicines. These may include: Over-the-counter antacids. Medicines that make your stomach empty more quickly. Medicines that block the production of stomach acid (H2 blockers). Stronger medicines to reduce stomach acid (proton pump inhibitors). Surgery to repair the hernia, if other treatments are not helping. If you have no symptoms, you may not need treatment. Follow these instructions at home: Lifestyle and activity Do not use any products that contain nicotine or tobacco. These products include cigarettes, chewing tobacco, and vaping devices, such as e-cigarettes. If you need help quitting, ask your health care provider. Try to achieve and maintain a healthy body weight. Avoid putting pressure on your abdomen. Anything that puts pressure on your abdomen increases the amount of acid that may be pushed up into your esophagus. Avoid bending over, especially after eating. Raise the head of your bed by putting blocks under the legs. This keeps your head and esophagus higher than your stomach. Do not wear tight clothing around your chest or stomach. Try not to strain when having a bowel movement, when urinating, or when lifting heavy objects. Eating and drinking Avoid foods that can worsen GERD symptoms. These may include: Fatty foods, like fried foods. Citrus fruits, like oranges or lemon. Other foods  and drinks that contain acid, like orange juice or tomatoes. Spicy food. Chocolate. Eat frequent small meals instead of three large meals a day. This helps prevent your stomach from getting too full. Eat slowly. Do not lie down right after eating. Do not eat 1-2  hours before bed. Do not drink beverages with caffeine. These include cola, coffee, cocoa, and tea. Do not drink alcohol. General instructions Take over-the-counter and prescription medicines only as told by your health care provider. Keep all follow-up visits. Your health care provider will want to check that any new prescribed medicines are helping your symptoms. Contact a health care provider if: Your symptoms are not controlled with medicines or lifestyle changes. You are having trouble swallowing. You have coughing or wheezing that will not go away. Your pain is getting worse. Your pain spreads to your arms, neck, jaw, teeth, or back. You feel nauseous or you vomit. Get help right away if: You have shortness of breath. You vomit blood. You have bright red blood in your stools. You have black, tarry stools. These symptoms may be an emergency. Get help right away. Call 911. Do not wait to see if the symptoms will go away. Do not drive yourself to the hospital. Summary A hiatal hernia occurs when part of the stomach slides above the muscle that separates the abdomen from the chest. A person may be born with a weakness in the hiatus, or a weakness can develop over time. Symptoms of a hiatal hernia may include heartburn, trouble swallowing, or sore throat. Management of a hiatal hernia includes eating frequent small meals instead of three large meals a day. Get help right away if you vomit blood, have bright red blood in your stools, or have black, tarry stools. This information is not intended to replace advice given to you by your health care provider. Make sure you discuss any questions you have with your health care provider. Document Revised: 07/02/2021 Document Reviewed: 07/02/2021 Elsevier Patient Education  2024 [employer].

## 2023-09-16 NOTE — Progress Notes (Signed)
 Patient ID: Kristy Mejia, female   DOB: [birth year], 78 y.o.   MRN: [id number]  HPI Kristy Mejia is a 78 y.o. female seen in consultation for chamfers facial hernia..  Underwent recent cardiac ct that have personally reviewed showing a giant paraesophageal hernia with the entire stomach in addition to large bowel.  Significant kyphosis and significant compression of the MEDIASTINUM by hernia contents . She does have a significant history of GERD, paroxysmal SVT as well as significant shortness of breath.  She has been evaluated by both cardiology and pulmonary.  Pulmonary recommending repair of the hernia due to significant pulmonary issues She does have dyspnea on exertion, she is very independent , still drives, no cognitive impairment. She has a hx spine issues with kyphosis and has had 4 major back surgeries. Her mind is pristine, she lives with her husband. She experiences some intermittent dysphagia and some reflux as well Prior abdominal hysterectomy   HPI  Past Medical History:  Diagnosis Date   Arthritis    Atypical chest pain    a. 04/2015 Myoview : EF 78%, breast attenuation, no ischemia-->Low risk.   Back pain    scoliosis   Chicken pox    Dysrhythmia    hx palpitations   GERD (gastroesophageal reflux disease)    History of hiatal hernia    noted on cxr   Hyperlipidemia    Joint pain    Obesity, unspecified    Paroxysmal SVT (supraventricular tachycardia) (HCC)    a. 2013 Holter: PACs/PVCs; b. 10/2011 Ehco: EF nl, no rwma, mild LVH, mild TR, PASP ; c 04/2015 SVT in ED->resolved with adenosine ; c.    Pneumonia 2010   hx of   Scoliosis    multiple areas of back follows with Dr. Twyla Galeazzi chiropractor    Swine flu    2000s    Past Surgical History:  Procedure Laterality Date   ABDOMINAL HYSTERECTOMY     1990 ovaries intact. had 2/2 fibroids last pap 2004 neg.  ? if cervix present    BACK SURGERY  1960's   d/t scoliosis-1964/65?   BACK SURGERY     x 3     BREAST BIOPSY Left 2008   CORE W/CLIP - NEG   BREAST BIOPSY     2000    BREAST EXCISIONAL BIOPSY Right 20 + yrs ago   BREAST SURGERY  1990   excision breast mass   CARDIAC ELECTROPHYSIOLOGY STUDY AND ABLATION     10/12/19 for DVT [facility]   COLONOSCOPY  2007   Dr. Felicita Horns   COLONOSCOPY WITH ESOPHAGOGASTRODUODENOSCOPY (EGD) AND ESOPHAGEAL DILATION (ED)     COLONOSCOPY WITH PROPOFOL  N/A 03/22/2018   Procedure: COLONOSCOPY WITH PROPOFOL ;  Surgeon: Cassie Click, MD;  Location: [facility] ENDOSCOPY;  Service: Endoscopy;  Laterality: N/A;   ESOPHAGOGASTRODUODENOSCOPY (EGD) WITH PROPOFOL  N/A 03/22/2018   Procedure: ESOPHAGOGASTRODUODENOSCOPY (EGD) WITH PROPOFOL ;  Surgeon: Cassie Click, MD;  Location: [facility] ENDOSCOPY;  Service: Endoscopy;  Laterality: N/A;   EYE SURGERY  2007   cataracts   FOOT SURGERY  1960's   JOINT REPLACEMENT     total hip left with metal hardward 2016 Dr. Tamela Fake ortho    OTHER SURGICAL HISTORY  2004   Bilateral cataracts   PARTIAL HYSTERECTOMY  1990   REVISION TOTAL HIP ARTHROPLASTY Left 04/17/2014   DR ROWAN   TOTAL HIP ARTHROPLASTY  2006   TOTAL HIP REVISION Left 04/17/2014   Procedure: LEFT TOTAL HIP REVISION;  Surgeon: Elgie Grist  Carry Clapper, MD;  Location: [facility] OR;  Service: Orthopedics;  Laterality: Left;   TOTAL KNEE ARTHROPLASTY Right 10/30/2017   Procedure: RIGHT TOTAL KNEE ARTHROPLASTY;  Surgeon: Wendolyn Hamburger, MD;  Location: [facility] OR;  Service: Orthopedics;  Laterality: Right;    Family History  Problem Relation Age of Onset   Dementia Mother        dx early to mid 69s died 38   Cancer Mother        breast cancer   Breast cancer Mother 32   Arthritis Mother    Varicose Veins Mother    Heart attack Father    Heart disease Father        MI   Diabetes Father    Hypertension Father    Early death Father    Hyperlipidemia Father    Heart disease Brother    Hypertension Brother    Aneurysm Brother        cardiac    Early death Brother    Colon cancer  Cousin     Social History Social History   Tobacco Use   Smoking status: Never   Smokeless tobacco: Never  Vaping Use   Vaping status: Never Used  Substance Use Topics   Alcohol use: Not Currently    Comment: rare   Drug use: No    Allergies  Allergen Reactions   Metoprolol  Tartrate     Sob with exertion, ? Heart pounding, fatigue and heart pounding even with 12.5 mg bid dosing     Pravastatin      lethargy and fatigue.  After about 4 weeks, I also started feeling "heavy" heart beating and generally not feeling well.   Diltiazem  Rash    CD formulation likely generalized drug rash/eruption face, trunk, arms    Metoprolol  Succinate [Metoprolol ] Other (See Comments)    Succinate version causes extreme lethargy    Tape Itching, Rash and Other (See Comments)    Reaction: blisters (adhesive tape) Paper tape is ok    Current Outpatient Medications  Medication Sig Dispense Refill   acetaminophen  (TYLENOL ) 325 MG tablet      aspirin  EC 81 MG tablet Take 81 mg by mouth daily.     famotidine  (PEPCID ) 20 MG tablet Take 1 tablet (20 mg total) by mouth daily. 90 tablet 3   lovastatin  (MEVACOR ) 10 MG tablet Take 1 tablet (10 mg total) by mouth at bedtime. 90 tablet 3   meloxicam  (MOBIC ) 7.5 MG tablet Take 1 tablet (7.5 mg total) by mouth daily.     nebivolol  (BYSTOLIC ) 2.5 MG tablet Take 1 tablet (2.5 mg total) by mouth daily. 90 tablet 3   No current facility-administered medications for this visit.     Review of Systems Full ROS  was asked and was negative except for the information on the HPI  Physical Exam  CONSTITUTIONAL: debilitated with severe scoliosis. EYES: Pupils are equal, round,  Sclera are non-icteric. EARS, NOSE, MOUTH AND THROAT: The oropharynx is clear. The oral mucosa is pink and moist. Hearing is intact to voice. Neck Severe kyphosis and scoliosis LYMPH NODES:  Lymph nodes in the neck are normal. RESPIRATORY:  Lungs are clear. There is normal respiratory  effort, with equal breath sounds bilaterally, and without pathologic use of accessory muscles. CARDIOVASCULAR: Heart is regular without murmurs, gallops, or rubs. GI: The abdomen is  soft, nontender, and nondistended. There are no palpable masses. There is no hepatosplenomegaly. There are normal bowel sounds in all quadrants. Given her scoliosis there  is limited torso space and she has difficulty laying completely flat. GU: Rectal deferred.   MUSCULOSKELETAL: Normal muscle strength and tone. No cyanosis or edema.   SKIN: Turgor is good and there are no pathologic skin lesions or ulcers. NEUROLOGIC: Motor and sensation is grossly normal. Cranial nerves are grossly intact. PSYCH:  Oriented to person, place and time. Affect is normal.  Data Reviewed  I have personally reviewed the patient's imaging, laboratory findings and medical records.    Assessment/Plan 78 year old female with giant paraesophageal hernia causing both reflux and significant pulmonary and cardiac compression. She does have severe scoliosis and kyphosis with limited upper body and neck mobility. I did have an extensive d/w the pt about her disease process. I do think we will need to repair this massive hernia at some point in time. I would like to obtain CT A/p and swallow and have her husband be present during our next visit. She understands that this is a complex hernia that won't be easy to fix, she may require prolonged hospitalization and she is at higher risk of perioperative complications. Please note  I spent 60 minutes in this encounter including reviewing medical records, pers reviewing images , coundeling the pt, coordinating her care and performing documentation   Evelia Hipp, MD FACS General Surgeon 09/16/2023, 10:40 AM

## 2023-09-22 ENCOUNTER — Ambulatory Visit: Admitting: General Surgery

## 2023-09-22 ENCOUNTER — Ambulatory Visit
Admission: RE | Admit: 2023-09-22 | Discharge: 2023-09-22 | Disposition: A | Discharge status: Home / Self Care | Source: Ambulatory Visit | Attending: Surgery

## 2023-09-22 ENCOUNTER — Encounter: Payer: Self-pay | Admitting: General Surgery

## 2023-09-22 ENCOUNTER — Ambulatory Visit
Admission: RE | Admit: 2023-09-22 | Discharge: 2023-09-22 | Disposition: A | Discharge status: Home / Self Care | Source: Ambulatory Visit | Attending: Surgery | Admitting: Surgery

## 2023-09-22 VITALS — BP 127/81 | HR 84 | Ht 62.0 in | Wt 168.0 lb

## 2023-09-22 DIAGNOSIS — K5730 Diverticulosis of large intestine without perforation or abscess without bleeding: Secondary | ICD-10-CM | POA: Diagnosis not present

## 2023-09-22 DIAGNOSIS — R1310 Dysphagia, unspecified: Secondary | ICD-10-CM

## 2023-09-22 DIAGNOSIS — K449 Diaphragmatic hernia without obstruction or gangrene: Secondary | ICD-10-CM

## 2023-09-22 DIAGNOSIS — K2980 Duodenitis without bleeding: Secondary | ICD-10-CM | POA: Diagnosis not present

## 2023-09-22 DIAGNOSIS — K224 Dyskinesia of esophagus: Secondary | ICD-10-CM | POA: Diagnosis not present

## 2023-09-22 DIAGNOSIS — K219 Gastro-esophageal reflux disease without esophagitis: Secondary | ICD-10-CM | POA: Diagnosis not present

## 2023-09-22 MED ORDER — IOHEXOL 300 MG/ML  SOLN
100.0000 mL | Freq: Once | INTRAMUSCULAR | Status: AC | PRN
  Administered 2023-09-22: 100 mL via INTRAVENOUS

## 2023-09-22 NOTE — Patient Instructions (Addendum)
 The scheduler will call you to verify your date for the Endoscopy and to go over information with you and instructions.  We are looking at doing this on June 4th at [facility] with Dr Cornel Diesel.   Upper Endoscopy, Adult Upper endoscopy is a procedure to look inside the upper GI (gastrointestinal) tract. The upper GI tract is made up of: The esophagus. This is the part of the body that moves food from your mouth to your stomach. The stomach. The duodenum. This is the first part of your small intestine. This procedure is also called esophagogastroduodenoscopy (EGD) or gastroscopy. In this procedure, your health care provider passes a thin, flexible tube (endoscope) through your mouth and down your esophagus into your stomach and into your duodenum. A small camera is attached to the end of the tube. Images from the camera appear on a monitor in the exam room. During this procedure, your health care provider may also remove a small piece of tissue to be sent to a lab and examined under a microscope (biopsy). Your health care provider may do an upper endoscopy to diagnose cancers of the upper GI tract. You may also have this procedure to find the cause of other conditions, such as: Stomach pain. Heartburn. Pain or problems when swallowing. Nausea and vomiting. Stomach bleeding. Stomach ulcers. Tell a health care provider about: Any allergies you have. All medicines you are taking, including vitamins, herbs, eye drops, creams, and over-the-counter medicines. Any problems you or family members have had with anesthetic medicines. Any bleeding problems you have. Any surgeries you have had. Any medical conditions you have. Whether you are pregnant or may be pregnant. What are the risks? Your healthcare provider will talk with you about risks. These may include: Infection. Bleeding. Allergic reactions to medicines. A tear or hole (perforation) in the esophagus, stomach, or duodenum. What happens  before the procedure? Medicines Ask your health care provider about: Changing or stopping your regular medicines. This is especially important if you are taking diabetes medicines or blood thinners. Taking medicines such as aspirin  and ibuprofen. These medicines can thin your blood. Do not take these medicines unless your health care provider tells you to take them. Taking over-the-counter medicines, vitamins, herbs, and supplements. General instructions If you will be going home right after the procedure, plan to have a responsible adult: Take you home from the hospital or clinic. You will not be allowed to drive. Care for you for the time you are told. What happens during the procedure?  An IV will be inserted into one of your veins. You may be given one or more of the following: A medicine to help you relax (sedative). A medicine to numb the throat (local anesthetic). You will lie on your left side on an exam table. Your health care provider will pass the endoscope through your mouth and down your esophagus. Your health care provider will use the scope to check the inside of your esophagus, stomach, and duodenum. Biopsies may be taken. The endoscope will be removed. The procedure may vary among health care providers and hospitals. What happens after the procedure? Your blood pressure, heart rate, breathing rate, and blood oxygen level will be monitored until you leave the hospital or clinic. When your throat is no longer numb, you may be given some fluids to drink. If you were given a sedative during the procedure, it can affect you for several hours. Do not drive or operate machinery until your health care provider says  that it is safe. It is up to you to get the results of your procedure. Ask your health care provider, or the department that is doing the procedure, when your results will be ready. Contact a health care provider if you: Have a sore throat that lasts longer than 1  day. Have a fever. Get help right away if you: Vomit blood or your vomit looks like coffee grounds. Have bloody, black, or tarry stools. Have a very bad sore throat or you cannot swallow. Have difficulty breathing or very bad pain in your chest or abdomen. These symptoms may be an emergency. Get help right away. Call 911. Do not wait to see if the symptoms will go away. Do not drive yourself to the hospital. Summary Upper endoscopy is a procedure to look inside the upper GI tract. During the procedure, an IV will be inserted into one of your veins. You may be given a medicine to help you relax. The endoscope will be passed through your mouth and down your esophagus. Follow instructions from your health care provider about what you can eat and drink. This information is not intended to replace advice given to you by your health care provider. Make sure you discuss any questions you have with your health care provider. Document Revised: 08/14/2021 Document Reviewed: 08/14/2021 Elsevier Patient Education  2024 [employer].

## 2023-09-24 NOTE — Progress Notes (Signed)
 Patient ID: Kristy Mejia, female   DOB: May 24, 1945, 78 y.o.   MRN: [id number] CC: Hiatal Hernia History of Present Illness Kristy Mejia is a 77 y.o. female with past medical history as below who is seen in consultation for need for EGD for paraesophageal hernia.  The patient underwent recent cardiac CT that showed a giant paraesophageal hernia.  She has a history of GERD as well as paroxysmal SVT but has been evaluated by cardiology and pulmonology.  She reports feeling of food getting stuck in her throat and sometimes has to throw up because it gets stuck.  She has had an EGD before and was told that there were some changes consistent with GERD and takes a PPI now.  She says with the PPI she does not have any symptoms of reflux. Past Medical History Past Medical History:  Diagnosis Date   Arthritis    Atypical chest pain    a. 04/2015 Myoview : EF 78%, breast attenuation, no ischemia-->Low risk.   Back pain    scoliosis   Chicken pox    Dysrhythmia    hx palpitations   GERD (gastroesophageal reflux disease)    History of hiatal hernia    noted on cxr   Hyperlipidemia    Joint pain    Obesity, unspecified    Paroxysmal SVT (supraventricular tachycardia) (HCC)    a. 2013 Holter: PACs/PVCs; b. 10/2011 Ehco: EF nl, no rwma, mild LVH, mild TR, PASP ; c 04/2015 SVT in ED->resolved with adenosine ; c.    Pneumonia 2010   hx of   Scoliosis    multiple areas of back follows with Dr. Twyla Galeazzi chiropractor    Swine flu    2000s       Past Surgical History:  Procedure Laterality Date   ABDOMINAL HYSTERECTOMY     1990 ovaries intact. had 2/2 fibroids last pap 2004 neg.  ? if cervix present    BACK SURGERY  1960's   d/t scoliosis-1964/65?   BACK SURGERY     x 3    BREAST BIOPSY Left 2008   CORE W/CLIP - NEG   BREAST BIOPSY     2000    BREAST EXCISIONAL BIOPSY Right 20 + yrs ago   BREAST SURGERY  1990   excision breast mass   CARDIAC ELECTROPHYSIOLOGY STUDY AND ABLATION      10/12/19 for DVT [facility]   COLONOSCOPY  2007   Dr. Felicita Horns   COLONOSCOPY WITH ESOPHAGOGASTRODUODENOSCOPY (EGD) AND ESOPHAGEAL DILATION (ED)     COLONOSCOPY WITH PROPOFOL  N/A 03/22/2018   Procedure: COLONOSCOPY WITH PROPOFOL ;  Surgeon: Cassie Click, MD;  Location: [facility] ENDOSCOPY;  Service: Endoscopy;  Laterality: N/A;   ESOPHAGOGASTRODUODENOSCOPY (EGD) WITH PROPOFOL  N/A 03/22/2018   Procedure: ESOPHAGOGASTRODUODENOSCOPY (EGD) WITH PROPOFOL ;  Surgeon: Cassie Click, MD;  Location: [facility] ENDOSCOPY;  Service: Endoscopy;  Laterality: N/A;   EYE SURGERY  2007   cataracts   FOOT SURGERY  1960's   JOINT REPLACEMENT     total hip left with metal hardward 2016 Dr. Tamela Fake ortho    OTHER SURGICAL HISTORY  2004   Bilateral cataracts   PARTIAL HYSTERECTOMY  1990   REVISION TOTAL HIP ARTHROPLASTY Left 04/17/2014   DR Carry Clapper   TOTAL HIP ARTHROPLASTY  2006   TOTAL HIP REVISION Left 04/17/2014   Procedure: LEFT TOTAL HIP REVISION;  Surgeon: Ilean Mall, MD;  Location: [facility] OR;  Service: Orthopedics;  Laterality: Left;   TOTAL KNEE ARTHROPLASTY Right 10/30/2017  Procedure: RIGHT TOTAL KNEE ARTHROPLASTY;  Surgeon: Wendolyn Hamburger, MD;  Location: [facility] OR;  Service: Orthopedics;  Laterality: Right;    Allergies  Allergen Reactions   Metoprolol  Tartrate     Sob with exertion, ? Heart pounding, fatigue and heart pounding even with 12.5 mg bid dosing     Pravastatin      lethargy and fatigue.  After about 4 weeks, I also started feeling "heavy" heart beating and generally not feeling well.   Diltiazem  Rash    CD formulation likely generalized drug rash/eruption face, trunk, arms    Metoprolol  Succinate [Metoprolol ] Other (See Comments)    Succinate version causes extreme lethargy    Tape Itching, Rash and Other (See Comments)    Reaction: blisters (adhesive tape) Paper tape is ok    Current Outpatient Medications  Medication Sig Dispense Refill   acetaminophen  (TYLENOL ) 325 MG tablet       aspirin  EC 81 MG tablet Take 81 mg by mouth daily.     Cholecalciferol (VITAMIN D3 PO) Take by mouth.     famotidine  (PEPCID ) 20 MG tablet Take 1 tablet (20 mg total) by mouth daily. 90 tablet 3   losartan (COZAAR) 50 MG tablet Take 50 mg by mouth daily.     lovastatin  (MEVACOR ) 10 MG tablet Take 1 tablet (10 mg total) by mouth at bedtime. 90 tablet 3   meloxicam  (MOBIC ) 7.5 MG tablet Take 1 tablet (7.5 mg total) by mouth daily.     Multiple Vitamin (MULTIVITAMIN) tablet Take 1 tablet by mouth daily.     No current facility-administered medications for this visit.    Family History Family History  Problem Relation Age of Onset   Dementia Mother        dx early to mid 3s died 67   Cancer Mother        breast cancer   Breast cancer Mother 82   Arthritis Mother    Varicose Veins Mother    Heart attack Father    Heart disease Father        MI   Diabetes Father    Hypertension Father    Early death Father    Hyperlipidemia Father    Heart disease Brother    Hypertension Brother    Aneurysm Brother        cardiac    Early death Brother    Colon cancer Cousin        Social History Social History   Tobacco Use   Smoking status: Never    Passive exposure: Never   Smokeless tobacco: Never  Vaping Use   Vaping status: Never Used  Substance Use Topics   Alcohol use: Not Currently    Comment: rare   Drug use: No        ROS Full ROS of systems performed and is otherwise negative there than what is stated in the HPI  Physical Exam Blood pressure 127/81, pulse 84, height 5\' 2"  (1.575 m), weight 168 lb (76.2 kg), SpO2 97%.  Alert and oriented x 3, normal work of breathing on room air, clear to auscultation bilaterally, regular rate and rhythm, but she does have some kyphosis, abdomen soft, nontender nondistended, moving all extremities spontaneously..  Data Reviewed I reviewed her CT scan.  This is consistent with a large paraesophageal hernia with stomach and viscera  within the mediastinum.  I have personally reviewed the patient's imaging and medical records.    Assessment    Patient with large paraesophageal  hernia and needs an EGD prior to repair of this.  I discussed the risk, benefits alternatives of EGD including risk of infection, bleeding if we take any biopsies as well as esophageal perforation.  She understands these risks and wishes to proceed.  Given that I am starting to scope at this location we will tentatively plan for June 4 and I will discuss with our GI colleagues about the logistics of elective scopes  Barrett Lick 09/24/2023, 8:49 AM

## 2023-09-30 ENCOUNTER — Encounter: Payer: Self-pay | Admitting: Surgery

## 2023-09-30 ENCOUNTER — Ambulatory Visit: Admitting: Surgery

## 2023-09-30 VITALS — BP 137/76 | HR 81 | Ht 62.0 in | Wt 169.0 lb

## 2023-09-30 DIAGNOSIS — K449 Diaphragmatic hernia without obstruction or gangrene: Secondary | ICD-10-CM | POA: Diagnosis not present

## 2023-09-30 NOTE — Patient Instructions (Signed)
 We will have you follow up here in 2 months after you have your upper endoscopy done.     Please call and ask to speak with a nurse if you develop questions or concerns.

## 2023-10-01 ENCOUNTER — Encounter: Payer: Self-pay | Admitting: Surgery

## 2023-10-01 NOTE — Progress Notes (Signed)
 Outpatient Surgical Follow Up    Kristy Mejia is an 78 y.o. female.   Chief Complaint  Patient presents with   Follow-up    HPI:  Kristy Mejia is a 78 y.o. female seen in consultation for chamfers facial hernia..  Underwent recent cardiac ct that have personally reviewed showing a giant paraesophageal hernia with the entire stomach in addition to large bowel.  Significant kyphosis and significant compression of the MEDIASTINUM by hernia contents . She does have a significant history of GERD, paroxysmal SVT as well as significant shortness of breath.  She has been evaluated by both cardiology and pulmonary.  Pulmonary recommending repair of the hernia due to significant pulmonary issues She does have dyspnea on exertion, she is very independent , still drives, no cognitive impairment. She has a hx spine issues with kyphosis and has had 4 major back surgeries. Her mind is pristine, she lives with her husband. She experiences some intermittent dysphagia and some reflux as well Prior abdominal hysterectomy Now is here with the husband.  I have shared the images including the CT and barium swallow showing a giant paraesophageal hernia with the entire stomach within the mediastinum and also colon within the mediastinum. Some dysmotility    Past Medical History:  Diagnosis Date   Arthritis    Atypical chest pain    a. 04/2015 Myoview : EF 78%, breast attenuation, no ischemia-->Low risk.   Back pain    scoliosis   Chicken pox    Dysrhythmia    hx palpitations   GERD (gastroesophageal reflux disease)    History of hiatal hernia    noted on cxr   Hyperlipidemia    Joint pain    Obesity, unspecified    Paroxysmal SVT (supraventricular tachycardia) (HCC)    a. 2013 Holter: PACs/PVCs; b. 10/2011 Ehco: EF nl, no rwma, mild LVH, mild TR, PASP ; c 04/2015 SVT in ED->resolved with adenosine ; c.    Pneumonia 2010   hx of   Scoliosis    multiple areas of back follows with Dr. Twyla Galeazzi  chiropractor    Swine flu    2000s    Past Surgical History:  Procedure Laterality Date   ABDOMINAL HYSTERECTOMY     1990 ovaries intact. had 2/2 fibroids last pap 2004 neg.  ? if cervix present    BACK SURGERY  1960's   d/t scoliosis-1964/65?   BACK SURGERY     x 3    BREAST BIOPSY Left 2008   CORE W/CLIP - NEG   BREAST BIOPSY     2000    BREAST EXCISIONAL BIOPSY Right 20 + yrs ago   BREAST SURGERY  1990   excision breast mass   CARDIAC ELECTROPHYSIOLOGY STUDY AND ABLATION     10/12/19 for DVT [facility]   COLONOSCOPY  2007   Dr. Felicita Horns   COLONOSCOPY WITH ESOPHAGOGASTRODUODENOSCOPY (EGD) AND ESOPHAGEAL DILATION (ED)     COLONOSCOPY WITH PROPOFOL  N/A 03/22/2018   Procedure: COLONOSCOPY WITH PROPOFOL ;  Surgeon: Cassie Click, MD;  Location: [facility] ENDOSCOPY;  Service: Endoscopy;  Laterality: N/A;   ESOPHAGOGASTRODUODENOSCOPY (EGD) WITH PROPOFOL  N/A 03/22/2018   Procedure: ESOPHAGOGASTRODUODENOSCOPY (EGD) WITH PROPOFOL ;  Surgeon: Cassie Click, MD;  Location: [facility] ENDOSCOPY;  Service: Endoscopy;  Laterality: N/A;   EYE SURGERY  2007   cataracts   FOOT SURGERY  1960's   JOINT REPLACEMENT     total hip left with metal hardward 2016 Dr. Tamela Fake ortho    OTHER SURGICAL  HISTORY  2004   Bilateral cataracts   PARTIAL HYSTERECTOMY  1990   REVISION TOTAL HIP ARTHROPLASTY Left 04/17/2014   DR Carry Clapper   TOTAL HIP ARTHROPLASTY  2006   TOTAL HIP REVISION Left 04/17/2014   Procedure: LEFT TOTAL HIP REVISION;  Surgeon: Ilean Mall, MD;  Location: [facility] OR;  Service: Orthopedics;  Laterality: Left;   TOTAL KNEE ARTHROPLASTY Right 10/30/2017   Procedure: RIGHT TOTAL KNEE ARTHROPLASTY;  Surgeon: Wendolyn Hamburger, MD;  Location: [facility] OR;  Service: Orthopedics;  Laterality: Right;    Family History  Problem Relation Age of Onset   Dementia Mother        dx early to mid 33s died 48   Cancer Mother        breast cancer   Breast cancer Mother 30   Arthritis Mother    Varicose Veins Mother     Heart attack Father    Heart disease Father        MI   Diabetes Father    Hypertension Father    Early death Father    Hyperlipidemia Father    Heart disease Brother    Hypertension Brother    Aneurysm Brother        cardiac    Early death Brother    Colon cancer Cousin     Social History:  reports that she has never smoked. She has never been exposed to tobacco smoke. She has never used smokeless tobacco. She reports that she does not currently use alcohol. She reports that she does not use drugs.  Allergies:  Allergies  Allergen Reactions   Metoprolol  Tartrate     Sob with exertion, ? Heart pounding, fatigue and heart pounding even with 12.5 mg bid dosing     Pravastatin      lethargy and fatigue.  After about 4 weeks, I also started feeling "heavy" heart beating and generally not feeling well.   Diltiazem  Rash    CD formulation likely generalized drug rash/eruption face, trunk, arms    Metoprolol  Succinate [Metoprolol ] Other (See Comments)    Succinate version causes extreme lethargy    Tape Itching, Rash and Other (See Comments)    Reaction: blisters (adhesive tape) Paper tape is ok    Medications reviewed.    ROS Full ROS performed and is otherwise negative other than what is stated in HPI   BP 137/76   Pulse 81   Ht 5\' 2"  (1.575 m)   Wt 169 lb (76.7 kg)   SpO2 97%   BMI 30.91 kg/m   Physical Exam  CONSTITUTIONAL: debilitated with severe scoliosis. EYES: Pupils are equal, round,  Sclera are non-icteric. EARS, NOSE, MOUTH AND THROAT: The oropharynx is clear. The oral mucosa is pink and moist. Hearing is intact to voice. Neck Severe kyphosis and scoliosis LYMPH NODES:  Lymph nodes in the neck are normal. RESPIRATORY:  Lungs are clear. There is normal respiratory effort, with equal breath sounds bilaterally, and without pathologic use of accessory muscles. CARDIOVASCULAR: Heart is regular without murmurs, gallops, or rubs. GI: The abdomen is  soft,  nontender, and nondistended. There are no palpable masses. There is no hepatosplenomegaly. There are normal bowel sounds in all quadrants. Given her scoliosis there is limited torso space and she has difficulty laying completely flat. GU: Rectal deferred.   MUSCULOSKELETAL: Normal muscle strength and tone. No cyanosis or edema.   SKIN: Turgor is good and there are no pathologic skin lesions or ulcers. NEUROLOGIC: Motor and sensation  is grossly normal. Cranial nerves are grossly intact. PSYCH:  Oriented to person, place and time. Affect is normal.   Assessment/Plan: 79 year old female with giant paraesophageal hernia causing both reflux and significant pulmonary compression. She does have severe scoliosis and kyphosis with limited upper body and neck mobility. I did have an extensive d/w the pt about her disease process.   She understands that this is a complex hernia that won't be easy to fix, she may require prolonged hospitalization and she is at higher risk of perioperative complications.  Also do think that we need to do an endoscopic workup as an EGD.  I had significant hesitation since upon further examination there is very limited abdominal wall to work with as she has severe deformity of the spine collapsing the abdominal wall and my ability to perform major abdominal surgeries due to the deformity. I am not in a rush to do any surgical intervention and she is not in a rush either.  Want her to really think about this as this is a very high risk procedure given risk factors and anatomy and significant back deformity  Please note  I spent 40 minutes in this encounter including reviewing medical records, pers reviewing images , coundeling the pt, coordinating her care and performing documentation  Evelia Hipp, MD [facility] General Surgeon

## 2023-10-08 DIAGNOSIS — M65341 Trigger finger, right ring finger: Secondary | ICD-10-CM | POA: Diagnosis not present

## 2023-10-08 DIAGNOSIS — R202 Paresthesia of skin: Secondary | ICD-10-CM | POA: Diagnosis not present

## 2023-10-08 DIAGNOSIS — R200 Anesthesia of skin: Secondary | ICD-10-CM | POA: Insufficient documentation

## 2023-10-08 DIAGNOSIS — M150 Primary generalized (osteo)arthritis: Secondary | ICD-10-CM | POA: Diagnosis not present

## 2023-10-08 DIAGNOSIS — G5601 Carpal tunnel syndrome, right upper limb: Secondary | ICD-10-CM | POA: Insufficient documentation

## 2023-10-10 DIAGNOSIS — M150 Primary generalized (osteo)arthritis: Secondary | ICD-10-CM | POA: Insufficient documentation

## 2023-10-13 ENCOUNTER — Telehealth: Payer: Self-pay | Admitting: *Deleted

## 2023-10-13 ENCOUNTER — Other Ambulatory Visit: Payer: Self-pay | Admitting: *Deleted

## 2023-10-13 DIAGNOSIS — K449 Diaphragmatic hernia without obstruction or gangrene: Secondary | ICD-10-CM

## 2023-10-13 NOTE — Telephone Encounter (Signed)
 Patient called back and we are scheduling patient on 11/04/2023 with Dr Cornel Diesel.

## 2023-10-13 NOTE — Telephone Encounter (Addendum)
 Message left for patient to return my call.  Jaimy Kliethermes MRN: [id number] EGD ICD 10: K44.9 (hiatal hernia)

## 2023-10-14 ENCOUNTER — Ambulatory Visit

## 2023-10-26 ENCOUNTER — Ambulatory Visit: Payer: Self-pay | Admitting: *Deleted

## 2023-10-26 NOTE — Telephone Encounter (Signed)
 Scheduled to see Dr. Casimir Cleaver on 10/29/2023

## 2023-10-26 NOTE — Telephone Encounter (Signed)
 Patient received billing letter for Colonoscopy.  She wanted to make sure that she is having an Upper Endoscopy not a Colonoscopy as her billing letter noted.   Informed her that yes she is definitely having an Upper Endoscopy/EGD. She requested a new letter to be sent to her indicating that she is having Upper Endoscopy/EGD not Colonoscopy. Billing letter revised and sent to her via mychart.  Also included the CPT and Dx Code.  Thanks,  [place], [place]

## 2023-10-26 NOTE — Telephone Encounter (Signed)
 FYI Only or Action Required?: FYI only for provider  Patient was last seen in primary care on 03/09/2023 by Valli Gaw, MD. Called Nurse Triage reporting Hiatal Hernia. Symptoms began several months ago. Interventions attempted: Prescription medications: famotidine . Symptoms are: gradually worsening.  Triage Disposition: See PCP Within 2 Weeks  Patient/caregiver understands and will follow disposition?: yes

## 2023-10-26 NOTE — Telephone Encounter (Signed)
  Reason for Disposition  Previously diagnosed hernia  Answer Assessment - Initial Assessment Questions 1. ONSET:  "When did this first appear?"     Longer time- progressively worse 2. APPEARANCE: "What does it look like?"     Never had pain- is larger- considering surgery  4. LOCATION: "Where exactly is the hernia located?"     Hiatal hernia- scan within last 3 weeks 5. PATTERN: "Does the swelling come and go, or has it been constant since it started?"     na 6. PAIN: "Is there any pain?" If Yes, ask: "How bad is it?"  (Scale 1-10; or mild, moderate, severe)    - MILD (1-3): Doesn't interfere with normal activities, abdomen soft and not tender to touch.     - MODERATE (4-7): Interferes with normal activities or awakens from sleep, abdomen tender to touch.     - SEVERE (8-10): Excruciating pain, doubled over, unable to do any normal activities.       No- increased indigestion, stomach is hurting 7. DIAGNOSIS: "Have you been seen by a doctor (or NP/PA) for this?" "Did the doctor diagnose you as having a hernia?"     Yes, surgeon 8. OTHER SYMPTOMS: "Do you have any other symptoms?" (e.g., fever, abdomen pain, vomiting)     Abdominal pain  Protocols used: Hernia-A-AH     Copied from CRM [id number]. Topic: Clinical - Red Word Triage >> Oct 26, 2023 10:30 AM Ivette P wrote: [facility] that prompted transfer to Nurse Triage: Hernia, bothering her. alot of indigestion, needs new primary.

## 2023-10-29 ENCOUNTER — Ambulatory Visit

## 2023-10-29 VITALS — BP 126/80 | HR 70 | Temp 98.2°F | Ht 62.0 in | Wt 167.6 lb

## 2023-10-29 DIAGNOSIS — K449 Diaphragmatic hernia without obstruction or gangrene: Secondary | ICD-10-CM | POA: Diagnosis not present

## 2023-10-29 DIAGNOSIS — M25519 Pain in unspecified shoulder: Secondary | ICD-10-CM | POA: Insufficient documentation

## 2023-10-29 DIAGNOSIS — K219 Gastro-esophageal reflux disease without esophagitis: Secondary | ICD-10-CM

## 2023-10-29 DIAGNOSIS — M25552 Pain in left hip: Secondary | ICD-10-CM | POA: Diagnosis not present

## 2023-10-29 MED ORDER — PANTOPRAZOLE SODIUM 40 MG PO TBEC: DELAYED_RELEASE_TABLET | ORAL | 0 refills | Status: DC

## 2023-10-29 NOTE — Patient Instructions (Addendum)
 Avoid large meals, fatty foods, caffeine, spicy food, alcohol, and late-night eating and elevate head of bed.   I recommend we start you on Pantoprazole , take 40 mg twice a day for 2 weeks then 40 mg once a day after that. You can take Pepcid  for breakthrough symptoms but if your symptoms are controlled with Pantoprazole  you can hold off on taking Pepcid  right now. I am referring you to Dr. Tova Fresh at [facility] at your request for further evaluation as well.

## 2023-10-29 NOTE — Assessment & Plan Note (Addendum)
 Symptoms most likely related to hiatal hernia. Reviewed notes drom recent general surgery OV.   Avoid large meals, fatty foods, caffeine, spicy food, alcohol, and late-night eating and elevate head of bed.   Start Pantoprazole , take 40 mg twice a day for 2 weeks then 40 mg once a day after that. Take Pepcid  for breakthrough symptoms. Medication s/e discussed.   I am referring you to GI specialist Dr. Tova Fresh at [facility] at your request for further evaluation as well. Given patient's risk of proceeding with surgical interventions may benefit from establishing with gasteroenterologist to proceed with conservative management of symptoms.

## 2023-10-29 NOTE — Progress Notes (Signed)
 Acute Office Visit  Subjective:    Patient ID: Kristy Mejia, female    DOB: May 25, 1945, 78 y.o.   MRN: [id number]  Chief Complaint  Patient presents with   Hiatal Hernia   Patient is in today for following acute concerns: HPI -  Burning, epigastric pain non-radiating which was worse about 6 days ago which is progressively getting better now. She has a known history of hiatal hernia and has been taking Famotidine  20 mg in the morning. However, last weekend she had worsening symptoms despite taking Famotidine . Denies difficulty swallowing. She denies nausea, vomiting, change in bowel habit, no blood in stool, no unintentional weight loss, fever. She has not seen a GI doctor.  Patient is established with general surgery team. Most recent surgery visit with Dr. Dana Duncan on 09/30/23 for this, given complexity of hernia including pulmonary compression, other co-morbidities (scoliosis, kyphosis, age, cardiopulmonary risks) surgical intervention was not recommended at the time.   - EGD scheduled for 11/04/23. Last EGD 2019 at [facility].   - Patient is on Meloxicam  7.5 mg (once a month) for lower back, knees, hand pain. She is prescribed this by her orthopedic doctor. She also takes prn Tylenol . She has been on Aspirin  81 mg daily for years. Has a h/o SVT. She was told by her cardiologist to continue taking this. Patient does not always eat spicy food. She does not drink alcohol. She goes to bed around midnight and she does not eat past 8 PM.    ROS As per HPI    Objective:    BP 126/80   Pulse 70   Temp 98.2 F (36.8 C) (Oral)   Ht 5' 2 (1.575 m)   Wt 167 lb 9.6 oz (76 kg)   SpO2 99%   BMI 30.65 kg/m    Physical Exam Constitutional:      Appearance: Normal appearance.  HENT:     Head: Normocephalic and atraumatic.     Mouth/Throat:     Mouth: Mucous membranes are moist.  Neck:     Thyroid : No thyroid  mass or thyroid  tenderness.   Cardiovascular:     Rate and Rhythm: Normal rate and  regular rhythm.  Pulmonary:     Effort: Pulmonary effort is normal.     Breath sounds: Normal breath sounds.  Abdominal:     General: Bowel sounds are normal. There is no distension.     Palpations: Abdomen is soft.     Tenderness: There is no abdominal tenderness. There is no guarding.   Musculoskeletal:     Cervical back: Neck supple. No rigidity.     Right lower leg: No edema.     Left lower leg: No edema.     Comments: Thoracic kyphosis noted  Lymphadenopathy:     Cervical: No cervical adenopathy.   Skin:    General: Skin is warm.   Neurological:     Mental Status: She is alert and oriented to person, place, and time.   Psychiatric:        Mood and Affect: Mood normal.        Behavior: Behavior normal.     No results found for any visits on 10/29/23.     Assessment & Plan:  Hiatal hernia with GERD Assessment & Plan: Symptoms most likely related to hiatal hernia. Reviewed notes drom recent general surgery OV.   Avoid large meals, fatty foods, caffeine, spicy food, alcohol, and late-night eating and elevate head of bed.   Start Pantoprazole ,  take 40 mg twice a day for 2 weeks then 40 mg once a day after that. Take Pepcid  for breakthrough symptoms. Medication s/e discussed.   I am referring you to GI specialist Dr. Tova Fresh at [facility] at your request for further evaluation as well. Given patient's risk of proceeding with surgical interventions may benefit from establishing with gasteroenterologist to proceed with conservative management of symptoms.       Orders: -     Pantoprazole  Sodium; Take 1 tablet (40 mg total) by mouth 2 (two) times daily for 14 days, THEN 1 tablet (40 mg total) daily. Take.  Dispense: 90 tablet; Refill: 0 -     Ambulatory referral to Gastroenterology   I spent 35 minutes on the day of this face-to-face encounter reviewing the current medications, ongoing concerns, previous specialist visits, previous PCP note and reviewing the assessment  and plan with the patient.   Return for [facility] with Dr. Casimir Cleaver before October .  Jacklin Mascot, MD

## 2023-10-30 ENCOUNTER — Telehealth: Payer: Self-pay

## 2023-10-30 DIAGNOSIS — K449 Diaphragmatic hernia without obstruction or gangrene: Secondary | ICD-10-CM

## 2023-10-30 NOTE — Telephone Encounter (Signed)
 Left message for patient to give our office a call back to discuss recommendations per referral coordinator. Patient will need to reach out to [facility] office to see if she can re-establish with their office. Also, if she does she will have to sign a medical records release with [facility] and have it sent to Dr Haidee Lev office.

## 2023-10-30 NOTE — Telephone Encounter (Unsigned)
 Copied from CRM [id number]. Topic: General - Other >> Oct 30, 2023  9:21 AM Bambi Bonine D wrote: Reason for CRM: Edwina Gram with [facility] is calling regarding an outgoing referral for GI for the pt. Edwina Gram was transferred to the office for further assistance.

## 2023-10-30 NOTE — Telephone Encounter (Signed)
 Dr Haidee Lev office with [facility] called our office this morning to make us  aware that the patient was seen by [facility] and had a procedure with them. Dr Tova Fresh will not be able to see the patient, because once the patient was seen in another practice that was considered transfer of care. Please advise?

## 2023-10-30 NOTE — Telephone Encounter (Signed)
 Patient requested and would like to f/u with Dr. Tova Fresh.  Jacklin Mascot, MD

## 2023-11-04 ENCOUNTER — Ambulatory Visit
Admission: RE | Admit: 2023-11-04 | Discharge: 2023-11-04 | Disposition: A | Discharge status: Home / Self Care | Attending: General Surgery | Admitting: General Surgery

## 2023-11-04 ENCOUNTER — Ambulatory Visit: Admitting: Anesthesiology

## 2023-11-04 ENCOUNTER — Encounter
Admission: RE | Disposition: A | Discharge status: Home / Self Care | Payer: Self-pay | Source: Home / Self Care | Attending: General Surgery

## 2023-11-04 DIAGNOSIS — Z6830 Body mass index (BMI) 30.0-30.9, adult: Secondary | ICD-10-CM | POA: Diagnosis not present

## 2023-11-04 DIAGNOSIS — I10 Essential (primary) hypertension: Secondary | ICD-10-CM | POA: Diagnosis not present

## 2023-11-04 DIAGNOSIS — K449 Diaphragmatic hernia without obstruction or gangrene: Secondary | ICD-10-CM | POA: Diagnosis not present

## 2023-11-04 DIAGNOSIS — E669 Obesity, unspecified: Secondary | ICD-10-CM | POA: Insufficient documentation

## 2023-11-04 DIAGNOSIS — K219 Gastro-esophageal reflux disease without esophagitis: Secondary | ICD-10-CM | POA: Insufficient documentation

## 2023-11-04 HISTORY — PX: ESOPHAGOGASTRODUODENOSCOPY: SHX5428

## 2023-11-04 SURGERY — EGD (ESOPHAGOGASTRODUODENOSCOPY)
Anesthesia: General

## 2023-11-04 MED ORDER — ONDANSETRON HCL 4 MG/2ML IJ SOLN
INTRAMUSCULAR | Status: DC | PRN
  Administered 2023-11-04: 4 mg via INTRAVENOUS

## 2023-11-04 MED ORDER — FENTANYL CITRATE (PF) 100 MCG/2ML IJ SOLN
INTRAMUSCULAR | Status: AC
Start: 2023-11-04 — End: 2023-11-04
  Filled 2023-11-04: qty 2

## 2023-11-04 MED ORDER — PROPOFOL 10 MG/ML IV BOLUS
INTRAVENOUS | Status: DC | PRN
  Administered 2023-11-04: 100 mg via INTRAVENOUS

## 2023-11-04 MED ORDER — FENTANYL CITRATE (PF) 100 MCG/2ML IJ SOLN
INTRAMUSCULAR | Status: DC | PRN
  Administered 2023-11-04 (×4): 25 ug via INTRAVENOUS

## 2023-11-04 MED ORDER — PROPOFOL 500 MG/50ML IV EMUL
INTRAVENOUS | Status: DC | PRN
  Administered 2023-11-04: 100 ug/kg/min via INTRAVENOUS

## 2023-11-04 MED ORDER — GLYCOPYRROLATE 0.2 MG/ML IJ SOLN
INTRAMUSCULAR | Status: DC | PRN
Start: 2023-11-04 — End: 2023-11-04
  Administered 2023-11-04: .2 mg via INTRAVENOUS

## 2023-11-04 MED ORDER — SODIUM CHLORIDE 0.9 % IV SOLN: INTRAVENOUS | Status: DC

## 2023-11-04 NOTE — Anesthesia Preprocedure Evaluation (Signed)
 Anesthesia Evaluation  Patient identified by MRN, date of birth, ID band Patient awake    Reviewed: Allergy & Precautions, NPO status , Patient's Chart, lab work & pertinent test results  History of Anesthesia Complications Negative for: history of anesthetic complications  Airway Mallampati: III  TM Distance: >3 FB Neck ROM: full    Dental no notable dental hx.    Pulmonary shortness of breath and with exertion   Pulmonary exam normal        Cardiovascular hypertension, Pt. on medications + dysrhythmias Supra Ventricular Tachycardia      Neuro/Psych negative neurological ROS  negative psych ROS   GI/Hepatic Neg liver ROS, hiatal hernia,GERD  ,,  Endo/Other  negative endocrine ROS    Renal/GU negative Renal ROS  negative genitourinary   Musculoskeletal  (+) Arthritis ,    Abdominal   Peds  Hematology negative hematology ROS (+)   Anesthesia Other Findings Past Medical History: No date: Arthritis No date: Atypical chest pain     Comment:  a. 04/2015 Myoview : EF 78%, breast attenuation, no               ischemia-->Low risk. No date: Back pain     Comment:  scoliosis No date: Chicken pox No date: Dysrhythmia     Comment:  hx palpitations No date: GERD (gastroesophageal reflux disease) No date: History of hiatal hernia     Comment:  noted on cxr No date: Hyperlipidemia No date: Joint pain No date: Obesity, unspecified No date: Paroxysmal SVT (supraventricular tachycardia) (HCC)     Comment:  a. 2013 Holter: PACs/PVCs; b. 10/2011 Ehco: EF nl, no               rwma, mild LVH, mild TR, PASP ; c 04/2015 SVT in               ED->resolved with adenosine ; c.  2010: Pneumonia     Comment:  hx of No date: Scoliosis     Comment:  multiple areas of back follows with Dr. Twyla Galeazzi               chiropractor  No date: Swine flu     Comment:  2000s  Past Surgical History: No date: ABDOMINAL HYSTERECTOMY      Comment:  1990 ovaries intact. had 2/2 fibroids last pap 2004 neg.              ? if cervix present  1960's: BACK SURGERY     Comment:  d/t scoliosis-1964/65? No date: BACK SURGERY     Comment:  x 3  2008: BREAST BIOPSY; Left     Comment:  CORE W/CLIP - NEG No date: BREAST BIOPSY     Comment:  2000  20 + yrs ago: BREAST EXCISIONAL BIOPSY; Right 1990: BREAST SURGERY     Comment:  excision breast mass No date: CARDIAC ELECTROPHYSIOLOGY STUDY AND ABLATION     Comment:  10/12/19 for DVT [facility] 2007: COLONOSCOPY     Comment:  Dr. Felicita Horns No date: COLONOSCOPY WITH ESOPHAGOGASTRODUODENOSCOPY (EGD) AND  ESOPHAGEAL DILATION (ED) 03/22/2018: COLONOSCOPY WITH PROPOFOL ; N/A     Comment:  Procedure: COLONOSCOPY WITH PROPOFOL ;  Surgeon: Cassie Click, MD;  Location: [facility] ENDOSCOPY;  Service:               Endoscopy;  Laterality: N/A; 03/22/2018: ESOPHAGOGASTRODUODENOSCOPY (EGD) WITH PROPOFOL ; N/A     Comment:  Procedure: ESOPHAGOGASTRODUODENOSCOPY (EGD) WITH               PROPOFOL ;  Surgeon: Cassie Click, MD;  Location:               [facility] ENDOSCOPY;  Service: Endoscopy;  Laterality: N/A; 2007: EYE SURGERY     Comment:  cataracts 1960's: FOOT SURGERY No date: JOINT REPLACEMENT     Comment:  total hip left with metal hardward 2016 Dr. Tamela Fake ortho  2004: OTHER SURGICAL HISTORY     Comment:  Bilateral cataracts 1990: PARTIAL HYSTERECTOMY 04/17/2014: REVISION TOTAL HIP ARTHROPLASTY; Left     Comment:  DR Carry Clapper 2006: TOTAL HIP ARTHROPLASTY 04/17/2014: TOTAL HIP REVISION; Left     Comment:  Procedure: LEFT TOTAL HIP REVISION;  Surgeon: Ilean Mall, MD;  Location: [facility] OR;  Service: Orthopedics;                Laterality: Left; 10/30/2017: TOTAL KNEE ARTHROPLASTY; Right     Comment:  Procedure: RIGHT TOTAL KNEE ARTHROPLASTY;  Surgeon:               Wendolyn Hamburger, MD;  Location: [facility] OR;  Service:               Orthopedics;  Laterality:  Right;  BMI    Body Mass Index: 30.54 kg/m      Reproductive/Obstetrics negative OB ROS                              Anesthesia Physical Anesthesia Plan  ASA: 3  Anesthesia Plan: General   Post-op Pain Management: Minimal or no pain anticipated   Induction: Intravenous  PONV Risk Score and Plan: 2 and Propofol  infusion and TIVA  Airway Management Planned: Natural Airway and Nasal Cannula  Additional Equipment:   Intra-op Plan:   Post-operative Plan:   Informed Consent: I have reviewed the patients History and Physical, chart, labs and discussed the procedure including the risks, benefits and alternatives for the proposed anesthesia with the patient or authorized representative who has indicated his/her understanding and acceptance.     Dental Advisory Given  Plan Discussed with: Anesthesiologist, CRNA and Surgeon  Anesthesia Plan Comments: (Patient consented for risks of anesthesia including but not limited to:  - adverse reactions to medications - risk of airway placement if required - damage to eyes, teeth, lips or other oral mucosa - nerve damage due to positioning  - sore throat or hoarseness - Damage to heart, brain, nerves, lungs, other parts of body or loss of life  Patient voiced understanding and assent.)         Anesthesia Quick Evaluation

## 2023-11-04 NOTE — Telephone Encounter (Signed)
 Noted

## 2023-11-04 NOTE — Op Note (Addendum)
 [facility] Gastroenterology Patient Name: Kristy Mejia Procedure Date: 11/04/2023 9:07 AM MRN: [id number] Account #: [id number] Date of Birth: 1945-08-26 Admit Type: Outpatient Age: 78 Room: [facility] ENDO ROOM 1 Gender: Female Note Status: Supervisor Override Instrument Name: Barnie Endoscope [id number] Procedure:             Upper GI endoscopy Indications:           Preoperative assessment Providers:             Jayson KIDD. Marinda, MD Referring MD:          Jayson KIDD. Marinda, MD (Referring MD) Medicines:             See the Anesthesia note for documentation of the                         administered medications Complications:         No immediate complications. Estimated blood loss:                         Minimal. Procedure:             Pre-Anesthesia Assessment:                        - Prior to the procedure, a History and Physical was                         performed, and patient medications and allergies were                         reviewed. The patient's tolerance of previous                         anesthesia was also reviewed. The risks and benefits                         of the procedure and the sedation options and risks                         were discussed with the patient. All questions were                         answered, and informed consent was obtained. Prior                         Anticoagulants: The patient has taken no anticoagulant                         or antiplatelet agents. ASA Grade Assessment: III - A                         patient with severe systemic disease. After reviewing                         the risks and benefits, the patient was deemed in                         satisfactory condition to undergo the procedure.  After obtaining informed consent, the endoscope was                         passed under direct vision. Throughout the procedure,                         the patient's blood pressure, pulse, and  oxygen                         saturations were monitored continuously. The Endoscope                         was introduced through the mouth, and advanced to the                         duodenal bulb. The upper GI endoscopy was accomplished                         with ease. The patient tolerated the procedure well. Findings:      The Z-line was regular and was found 30 cm from the incisors.      A large type-III paraesophageal hernia was found. The proximal extent of       the gastric folds (end of tubular esophagus) was 32 cm from the incisors.      The in the duodenum was normal. Impression:            - Z-line regular, 30 cm from the incisors.                        - Large paraesophageal hernia.                        - Normal.                        - The examination was suspicious for Helicobacter                         pylori.                        - Biopsies were taken with a cold forceps for                         Helicobacter pylori testing. Recommendation:        - Refer to a Careers adviser. Procedure Code(s):     --- Professional ---                        [phone], Esophagogastroduodenoscopy, flexible,                         transoral; diagnostic, including collection of                         specimen(s) by brushing or washing, when performed                         (separate procedure) CPT copyright 2022 American Medical Association. All rights reserved. The codes documented in this report are preliminary and upon  coder review may  be revised to meet current compliance requirements. Jayson MALVA Endow, MD 11/04/2023 9:43:59 AM Number of Addenda: 0 Note Initiated On: 11/04/2023 9:07 AM      [facility]

## 2023-11-04 NOTE — Anesthesia Postprocedure Evaluation (Signed)
 Anesthesia Post Note  Patient: Kristy Mejia  Procedure(s) Performed: EGD (ESOPHAGOGASTRODUODENOSCOPY)  Patient location during evaluation: Endoscopy Anesthesia Type: General Level of consciousness: awake and alert Pain management: pain level controlled Vital Signs Assessment: post-procedure vital signs reviewed and stable Respiratory status: spontaneous breathing, nonlabored ventilation, respiratory function stable and patient connected to nasal cannula oxygen Cardiovascular status: blood pressure returned to baseline and stable Postop Assessment: no apparent nausea or vomiting Anesthetic complications: no   No notable events documented.   Last Vitals:  Vitals:   11/04/23 0951 11/04/23 1001  BP: 121/73 124/73  Pulse: 83   Resp: 16 16  Temp:    SpO2: 98% 98%    Last Pain:  Vitals:   11/04/23 1001  TempSrc:   PainSc: 0-No pain                 Nancey Awkward

## 2023-11-04 NOTE — Transfer of Care (Signed)
 Immediate Anesthesia Transfer of Care Note  Patient: Kristy Mejia  Procedure(s) Performed: EGD (ESOPHAGOGASTRODUODENOSCOPY)  Patient Location: PACU  Anesthesia Type:General  Level of Consciousness: awake, alert , and oriented  Airway & Oxygen Therapy: Patient Spontanous Breathing and Patient connected to nasal cannula oxygen  Post-op Assessment: Report given to RN and Patient moving all extremities  Post vital signs: Reviewed and stable  Last Vitals:  Vitals Value Taken Time  BP 125/68 11/04/23 09:41  Temp    Pulse 94 11/04/23 09:42  Resp 18 11/04/23 09:42  SpO2 96 % 11/04/23 09:42  Vitals shown include unfiled device data.  Last Pain:  Vitals:   11/04/23 0941  TempSrc:   PainSc: 0-No pain         Complications: No notable events documented.

## 2023-11-04 NOTE — Telephone Encounter (Signed)
 I put in an urgent referral to GI at [facility] clinic ( urgent because it has taken a while just to sort out who and where patient can be seen).   Jacklin Mascot, MD

## 2023-11-04 NOTE — Discharge Instructions (Signed)
YOU HAD AN ENDOSCOPIC PROCEDURE TODAY: Refer to the procedure report that was given to you for any specific questions about what was found during the examination.  If the procedure report does not answer your questions, please call your gastroenterologist to clarify. ° °YOU SHOULD EXPECT: Some feelings of bloating in the abdomen. Passage of more gas than usual.  Walking can help get rid of the air that was put into your GI tract during the procedure and reduce the bloating. If you had a lower endoscopy (such as a colonoscopy or flexible sigmoidoscopy) you may notice spotting of blood in your stool or on the toilet paper.  ° °DIET: Your first meal following the procedure should be a light meal and then it is ok to progress to your normal diet.  A half-sandwich or bowl of soup is an example of a good first meal.  Heavy or fried foods are harder to digest and may make you feel nasueas or bloated.  Drink plenty of fluids but you should avoid alcoholic beverages for 24 hours. ° °ACTIVITY: Your care partner should take you home directly after the procedure.  You should plan to take it easy, moving slowly for the rest of the day.  You can resume normal activity the day after the procedure however you should NOT DRIVE, make legal decisions or use heavy machinery for 24 hours (because of the sedation medicines used during the test).   ° °SYMPTOMS TO REPORT IMMEDIATELY  °A gastroenterologist can be reached at any hour.  Please call your doctor's office for any of the following symptoms: ° °· Following lower endoscopy (colonoscopy, flexible sigmoidoscopy) ° Excessive amounts of blood in the stool ° Significant tenderness, worsening of abdominal pains ° Swelling of the abdomen that is new, acute ° Fever of 100° or higher °· Following upper endoscopy (EGD, EUS, ERCP) ° Vomiting of blood or coffee ground material ° New, significant abdominal pain ° New, significant chest pain or pain under the shoulder blades ° Painful or  persistently difficult swallowing ° New shortness of breath ° Black, tarry-looking stools ° °FOLLOW UP: °If any biopsies were taken you will be contacted by phone or by letter within the next 1-3 weeks.  Call your gastroenterologist if you have not heard about the biopsies in 3 weeks.  ° °Please also call your gastroenterologist's office with any specific questions about appointments or follow up tests. °

## 2023-11-04 NOTE — Telephone Encounter (Unsigned)
 Copied from CRM [id number]. Topic: General - Other >> Nov 04, 2023  1:23 PM Annelle Kiel wrote: Reason for CRM: patient is needing to go back to [facility]   where she had her coloscopy  done at [phone] [facility] called in stating there was referral sent over for patient there unable to see her

## 2023-11-04 NOTE — Addendum Note (Signed)
 Addended by: Melissa Tomaselli on: 11/04/2023 02:48 PM   Modules accepted: Orders

## 2023-11-04 NOTE — H&P (Signed)
 CC: Hiatal Hernia History of Present Illness Kristy Mejia is a 78 y.o. female with past medical history as below who is seen in consultation for need for EGD for paraesophageal hernia.  The patient underwent recent cardiac CT that showed a giant paraesophageal hernia.  She has a history of GERD as well as paroxysmal SVT but has been evaluated by cardiology and pulmonology.  She reports feeling of food getting stuck in her throat and sometimes has to throw up because it gets stuck.  She has had an EGD before and was told that there were some changes consistent with GERD and takes a PPI now.  She says with the PPI she does not have any symptoms of reflux. Past Medical History     Past Medical History:  Diagnosis Date   Arthritis     Atypical chest pain      a. 04/2015 Myoview : EF 78%, breast attenuation, no ischemia-->Low risk.   Back pain      scoliosis   Chicken pox     Dysrhythmia      hx palpitations   GERD (gastroesophageal reflux disease)     History of hiatal hernia      noted on cxr   Hyperlipidemia     Joint pain     Obesity, unspecified     Paroxysmal SVT (supraventricular tachycardia) (HCC)      a. 2013 Holter: PACs/PVCs; b. 10/2011 Ehco: EF nl, no rwma, mild LVH, mild TR, PASP ; c 04/2015 SVT in ED->resolved with adenosine ; c.    Pneumonia 2010    hx of   Scoliosis      multiple areas of back follows with Dr. Twyla Galeazzi chiropractor    Swine flu      2000s                 Past Surgical History:  Procedure Laterality Date   ABDOMINAL HYSTERECTOMY        1990 ovaries intact. had 2/2 fibroids last pap 2004 neg.  ? if cervix present    BACK SURGERY   1960's    d/t scoliosis-1964/65?   BACK SURGERY        x 3    BREAST BIOPSY Left 2008    CORE W/CLIP - NEG   BREAST BIOPSY        2000    BREAST EXCISIONAL BIOPSY Right 20 + yrs ago   BREAST SURGERY   1990    excision breast mass   CARDIAC ELECTROPHYSIOLOGY STUDY AND ABLATION        10/12/19 for DVT [facility]    COLONOSCOPY   2007    Dr. Felicita Horns   COLONOSCOPY WITH ESOPHAGOGASTRODUODENOSCOPY (EGD) AND ESOPHAGEAL DILATION (ED)       COLONOSCOPY WITH PROPOFOL  N/A 03/22/2018    Procedure: COLONOSCOPY WITH PROPOFOL ;  Surgeon: Cassie Click, MD;  Location: [facility] ENDOSCOPY;  Service: Endoscopy;  Laterality: N/A;   ESOPHAGOGASTRODUODENOSCOPY (EGD) WITH PROPOFOL  N/A 03/22/2018    Procedure: ESOPHAGOGASTRODUODENOSCOPY (EGD) WITH PROPOFOL ;  Surgeon: Cassie Click, MD;  Location: [facility] ENDOSCOPY;  Service: Endoscopy;  Laterality: N/A;   EYE SURGERY   2007    cataracts   FOOT SURGERY   1960's   JOINT REPLACEMENT        total hip left with metal hardward 2016 Dr. Tamela Fake ortho    OTHER SURGICAL HISTORY   2004    Bilateral cataracts   PARTIAL HYSTERECTOMY   1990   REVISION TOTAL HIP  ARTHROPLASTY Left 04/17/2014    DR Carry Clapper   TOTAL HIP ARTHROPLASTY   2006   TOTAL HIP REVISION Left 04/17/2014    Procedure: LEFT TOTAL HIP REVISION;  Surgeon: Ilean Mall, MD;  Location: [facility] OR;  Service: Orthopedics;  Laterality: Left;   TOTAL KNEE ARTHROPLASTY Right 10/30/2017    Procedure: RIGHT TOTAL KNEE ARTHROPLASTY;  Surgeon: Wendolyn Hamburger, MD;  Location: [facility] OR;  Service: Orthopedics;  Laterality: Right;          Allergies       Allergies  Allergen Reactions   Metoprolol  Tartrate        Sob with exertion, ? Heart pounding, fatigue and heart pounding even with 12.5 mg bid dosing       Pravastatin         lethargy and fatigue.  After about 4 weeks, I also started feeling heavy heart beating and generally not feeling well.   Diltiazem  Rash      CD formulation likely generalized drug rash/eruption face, trunk, arms    Metoprolol  Succinate [Metoprolol ] Other (See Comments)      Succinate version causes extreme lethargy    Tape Itching, Rash and Other (See Comments)      Reaction: blisters (adhesive tape) Paper tape is ok              Current Outpatient Medications  Medication Sig Dispense Refill    acetaminophen  (TYLENOL ) 325 MG tablet         aspirin  EC 81 MG tablet Take 81 mg by mouth daily.       Cholecalciferol (VITAMIN D3 PO) Take by mouth.       famotidine  (PEPCID ) 20 MG tablet Take 1 tablet (20 mg total) by mouth daily. 90 tablet 3   losartan (COZAAR) 50 MG tablet Take 50 mg by mouth daily.       lovastatin  (MEVACOR ) 10 MG tablet Take 1 tablet (10 mg total) by mouth at bedtime. 90 tablet 3   meloxicam  (MOBIC ) 7.5 MG tablet Take 1 tablet (7.5 mg total) by mouth daily.       Multiple Vitamin (MULTIVITAMIN) tablet Take 1 tablet by mouth daily.          No current facility-administered medications for this visit.        Family History      Family History  Problem Relation Age of Onset   Dementia Mother          dx early to mid 72s died 42   Cancer Mother          breast cancer   Breast cancer Mother 68   Arthritis Mother     Varicose Veins Mother     Heart attack Father     Heart disease Father          MI   Diabetes Father     Hypertension Father     Early death Father     Hyperlipidemia Father     Heart disease Brother     Hypertension Brother     Aneurysm Brother          cardiac    Early death Brother     Colon cancer Cousin              Social History Social History  Social History         Tobacco Use   Smoking status: Never      Passive exposure: Never   Smokeless tobacco: Never  Vaping  Use   Vaping status: Never Used  Substance Use Topics   Alcohol use: Not Currently      Comment: rare   Drug use: No            ROS Full ROS of systems performed and is otherwise negative there than what is stated in the HPI   Physical Exam Blood pressure 127/81, pulse 84, height 5' 2 (1.575 m), weight 168 lb (76.2 kg), SpO2 97%.   Alert and oriented x 3, normal work of breathing on room air, clear to auscultation bilaterally, regular rate and rhythm, but she does have some kyphosis, abdomen soft, nontender nondistended, moving all extremities  spontaneously..   Data Reviewed I reviewed her CT scan.  This is consistent with a large paraesophageal hernia with stomach and viscera within the mediastinum.   I have personally reviewed the patient's imaging and medical records.     Assessment Assessment Patient with large paraesophageal hernia and needs an EGD prior to repair of this.  I discussed the risk, benefits alternatives of EGD including risk of infection, bleeding if we take any biopsies as well as esophageal perforation.  She understands these risks and wishes to proceed.  Given that I am starting to scope at this location we will tentatively plan for June 4 and I will discuss with our GI colleagues about the logistics of elective scopes   Barrett Lick

## 2023-11-05 LAB — SURGICAL PATHOLOGY

## 2023-11-12 NOTE — Telephone Encounter (Signed)
 As I am looking at your documentation from 11/04/23, which states referral was already redirected to [facility] so I am not really why this was not done already. Ideally patient would liked to see Dr. Renaye Sous at [facility] but based on documentation from 10/30/23 her office is not able to see her.   Could you please:  - Reach out to Dr. Nola office again and request an appointment for patient. - If Dr. Sous is not able to see the patient then have urgent referral to [facility]. I would appreciate on update regarding this referral.  Thank you,  Luke Shade, MD

## 2023-11-12 NOTE — Telephone Encounter (Signed)
 Can we follow up on GI referral to [facility] clinic? I don't see GI appointment scheduled with [facility] yet. I ordered urgent GI referral to [facility] clin[facility] on 11/04/23.   Thank you,  Luke Shade, MD

## 2023-11-16 ENCOUNTER — Encounter: Payer: Self-pay | Admitting: General Surgery

## 2023-12-02 ENCOUNTER — Ambulatory Visit: Admitting: Surgery

## 2023-12-02 ENCOUNTER — Encounter: Payer: Self-pay | Admitting: Surgery

## 2023-12-02 VITALS — BP 134/69 | HR 73 | Temp 98.1°F | Ht 62.0 in | Wt 164.8 lb

## 2023-12-02 DIAGNOSIS — K449 Diaphragmatic hernia without obstruction or gangrene: Secondary | ICD-10-CM

## 2023-12-02 NOTE — Progress Notes (Signed)
 Outpatient Surgical Follow Up  12/02/2023  Kristy Mejia is an 78 y.o. female.   Chief Complaint  Patient presents with   Follow-up    hernia    HPI: Kristy Mejia is a 78 y.o. female seen in consultation for chamfers facial hernia..  Underwent recent cardiac ct that have personally reviewed showing a giant paraesophageal hernia with the entire stomach in addition to large bowel.  Significant kyphosis and significant compression of the MEDIASTINUM by hernia contents . She does have a significant history of GERD, paroxysmal SVT as well as significant shortness of breath.  She has been evaluated by both cardiology and pulmonary.  Pulmonary recommending repair of the hernia due to significant pulmonary issues She does have dyspnea on exertion, she is very independent , still drives, no cognitive impairment. She has a hx spine issues with kyphosis and has had 4 major back surgeries. Her mind is pristine, she lives with her husband. She experiences some intermittent dysphagia and some reflux as well Prior abdominal hysterectomy Now is here with the husband.  I have shared the images including the CT and barium swallow showing a giant paraesophageal hernia with the entire stomach within the mediastinum and also colon within the mediastinum. Some dysmotility She had recent EGD showing giant hernia, no strictures or ulcers ( pers reviewed)    Past Medical History:  Diagnosis Date   Arthritis    Atypical chest pain    a. 04/2015 Myoview : EF 78%, breast attenuation, no ischemia-->Low risk.   Back pain    scoliosis   Chicken pox    Dysrhythmia    hx palpitations   GERD (gastroesophageal reflux disease)    History of hiatal hernia    noted on cxr   Hyperlipidemia    Joint pain    Obesity, unspecified    Paroxysmal SVT (supraventricular tachycardia) (HCC)    a. 2013 Holter: PACs/PVCs; b. 10/2011 Ehco: EF nl, no rwma, mild LVH, mild TR, PASP ; c 04/2015 SVT in ED->resolved with adenosine ;  c.    Pneumonia 2010   hx of   Scoliosis    multiple areas of back follows with Dr. India Daring chiropractor    Swine flu    2000s    Past Surgical History:  Procedure Laterality Date   ABDOMINAL HYSTERECTOMY     1990 ovaries intact. had 2/2 fibroids last pap 2004 neg.  ? if cervix present    BACK SURGERY  1960's   d/t scoliosis-1964/65?   BACK SURGERY     x 3    BREAST BIOPSY Left 2008   CORE W/CLIP - NEG   BREAST BIOPSY     2000    BREAST EXCISIONAL BIOPSY Right 20 + yrs ago   BREAST SURGERY  1990   excision breast mass   CARDIAC ELECTROPHYSIOLOGY STUDY AND ABLATION     10/12/19 for DVT [facility]   COLONOSCOPY  2007   Dr. Viktoria   COLONOSCOPY WITH ESOPHAGOGASTRODUODENOSCOPY (EGD) AND ESOPHAGEAL DILATION (ED)     COLONOSCOPY WITH PROPOFOL  N/A 03/22/2018   Procedure: COLONOSCOPY WITH PROPOFOL ;  Surgeon: Viktoria Lamar DASEN, MD;  Location: [facility] ENDOSCOPY;  Service: Endoscopy;  Laterality: N/A;   ESOPHAGOGASTRODUODENOSCOPY N/A 11/04/2023   Procedure: EGD (ESOPHAGOGASTRODUODENOSCOPY);  Surgeon: Marinda Jayson KIDD, MD;  Location: [facility] ENDOSCOPY;  Service: General;  Laterality: N/A;   ESOPHAGOGASTRODUODENOSCOPY (EGD) WITH PROPOFOL  N/A 03/22/2018   Procedure: ESOPHAGOGASTRODUODENOSCOPY (EGD) WITH PROPOFOL ;  Surgeon: Viktoria Lamar DASEN, MD;  Location: [facility] ENDOSCOPY;  Service:  Endoscopy;  Laterality: N/A;   EYE SURGERY  2007   cataracts   FOOT SURGERY  1960's   JOINT REPLACEMENT     total hip left with metal hardward 2016 Dr. Liam Mosses ortho    OTHER SURGICAL HISTORY  2004   Bilateral cataracts   PARTIAL HYSTERECTOMY  1990   REVISION TOTAL HIP ARTHROPLASTY Left 04/17/2014   DR LIAM   TOTAL HIP ARTHROPLASTY  2006   TOTAL HIP REVISION Left 04/17/2014   Procedure: LEFT TOTAL HIP REVISION;  Surgeon: Dempsey JINNY Liam, MD;  Location: [facility] OR;  Service: Orthopedics;  Laterality: Left;   TOTAL KNEE ARTHROPLASTY Right 10/30/2017   Procedure: RIGHT TOTAL KNEE ARTHROPLASTY;  Surgeon: Liam Dempsey,  MD;  Location: [facility] OR;  Service: Orthopedics;  Laterality: Right;    Family History  Problem Relation Age of Onset   Dementia Mother        dx early to mid 63s died 38   Cancer Mother        breast cancer   Breast cancer Mother 25   Arthritis Mother    Varicose Veins Mother    Heart attack Father    Heart disease Father        MI   Diabetes Father    Hypertension Father    Early death Father    Hyperlipidemia Father    Heart disease Brother    Hypertension Brother    Aneurysm Brother        cardiac    Early death Brother    Colon cancer Cousin     Social History:  reports that she has never smoked. She has never been exposed to tobacco smoke. She has never used smokeless tobacco. She reports that she does not currently use alcohol. She reports that she does not use drugs.  Allergies:  Allergies  Allergen Reactions   Metoprolol  Tartrate     Sob with exertion, ? Heart pounding, fatigue and heart pounding even with 12.5 mg bid dosing     Pravastatin      lethargy and fatigue.  After about 4 weeks, I also started feeling heavy heart beating and generally not feeling well.   Diltiazem  Rash    CD formulation likely generalized drug rash/eruption face, trunk, arms    Metoprolol  Succinate [Metoprolol ] Other (See Comments)    Succinate version causes extreme lethargy    Tape Itching, Rash and Other (See Comments)    Reaction: blisters (adhesive tape) Paper tape is ok    Medications reviewed.    ROS Full ROS performed and is otherwise negative other than what is stated in HPI   BP 134/69   Pulse 73   Temp 98.1 F (36.7 C) (Oral)   Ht 5' 2 (1.575 m)   Wt 164 lb 12.8 oz (74.8 kg)   SpO2 99%   BMI 30.14 kg/m   Physical Exam  CONSTITUTIONAL: debilitated with severe scoliosis. EYES: Pupils are equal, round,  Sclera are non-icteric. EARS, NOSE, MOUTH AND THROAT: The oropharynx is clear. The oral mucosa is pink and moist. Hearing is intact to voice. Neck Severe  kyphosis and scoliosis LYMPH NODES:  Lymph nodes in the neck are normal. RESPIRATORY:  Lungs are clear. There is normal respiratory effort, with equal breath sounds bilaterally, and without pathologic use of accessory muscles. CARDIOVASCULAR: Heart is regular without murmurs, gallops, or rubs. GI: The abdomen is  soft, nontender, and nondistended. There are no palpable masses. There is no hepatosplenomegaly. There are normal bowel  sounds in all quadrants. Given her scoliosis there is limited torso space and she has difficulty laying completely flat. GU: Rectal deferred.   MUSCULOSKELETAL: Normal muscle strength and tone. No cyanosis or edema.   SKIN: Turgor is good and there are no pathologic skin lesions or ulcers. NEUROLOGIC: Motor and sensation is grossly normal. Cranial nerves are grossly intact. PSYCH:  Oriented to person, place and time. Affect is normal.   Assessment/Plan: Expand All Collapse All  Outpatient Surgical Follow Up       Srah S Stolarz is an 78 y.o. female.       Chief Complaint  Patient presents with   Follow-up      HPI:  Marua SHAELEIGH GRAW is a 78 y.o. female seen in consultation for chamfers facial hernia..  Underwent recent cardiac ct that have personally reviewed showing a giant paraesophageal hernia with the entire stomach in addition to large bowel.  Significant kyphosis and significant compression of the MEDIASTINUM by hernia contents . She does have a significant history of GERD, paroxysmal SVT as well as significant shortness of breath.  She has been evaluated by both cardiology and pulmonary.  Pulmonary recommending repair of the hernia due to significant pulmonary issues She does have dyspnea on exertion, she is very independent , still drives, no cognitive impairment. She has a hx spine issues with kyphosis and has had 4 major back surgeries. Her mind is pristine, she lives with her husband. She experiences some intermittent dysphagia and some reflux as  well Prior abdominal hysterectomy Now is here with the husband.  I have shared the images including the CT and barium swallow showing a giant paraesophageal hernia with the entire stomach within the mediastinum and also colon within the mediastinum. Some dysmotility           Past Medical History:  Diagnosis Date   Arthritis     Atypical chest pain      a. 04/2015 Myoview : EF 78%, breast attenuation, no ischemia-->Low risk.   Back pain      scoliosis   Chicken pox     Dysrhythmia      hx palpitations   GERD (gastroesophageal reflux disease)     History of hiatal hernia      noted on cxr   Hyperlipidemia     Joint pain     Obesity, unspecified     Paroxysmal SVT (supraventricular tachycardia) (HCC)      a. 2013 Holter: PACs/PVCs; b. 10/2011 Ehco: EF nl, no rwma, mild LVH, mild TR, PASP ; c 04/2015 SVT in ED->resolved with adenosine ; c.    Pneumonia 2010    hx of   Scoliosis      multiple areas of back follows with Dr. India Daring chiropractor    Swine flu      2000s               Past Surgical History:  Procedure Laterality Date   ABDOMINAL HYSTERECTOMY        1990 ovaries intact. had 2/2 fibroids last pap 2004 neg.  ? if cervix present    BACK SURGERY   1960's    d/t scoliosis-1964/65?   BACK SURGERY        x 3    BREAST BIOPSY Left 2008    CORE W/CLIP - NEG   BREAST BIOPSY        2000    BREAST EXCISIONAL BIOPSY Right 20 + yrs ago   BREAST SURGERY  1990    excision breast mass   CARDIAC ELECTROPHYSIOLOGY STUDY AND ABLATION        10/12/19 for DVT [facility]   COLONOSCOPY   2007    Dr. Viktoria   COLONOSCOPY WITH ESOPHAGOGASTRODUODENOSCOPY (EGD) AND ESOPHAGEAL DILATION (ED)       COLONOSCOPY WITH PROPOFOL  N/A 03/22/2018    Procedure: COLONOSCOPY WITH PROPOFOL ;  Surgeon: Viktoria Lamar DASEN, MD;  Location: [facility] ENDOSCOPY;  Service: Endoscopy;  Laterality: N/A;   ESOPHAGOGASTRODUODENOSCOPY (EGD) WITH PROPOFOL  N/A 03/22/2018    Procedure: ESOPHAGOGASTRODUODENOSCOPY  (EGD) WITH PROPOFOL ;  Surgeon: Viktoria Lamar DASEN, MD;  Location: [facility] ENDOSCOPY;  Service: Endoscopy;  Laterality: N/A;   EYE SURGERY   2007    cataracts   FOOT SURGERY   1960's   JOINT REPLACEMENT        total hip left with metal hardward 2016 Dr. Liam Mosses ortho    OTHER SURGICAL HISTORY   2004    Bilateral cataracts   PARTIAL HYSTERECTOMY   1990   REVISION TOTAL HIP ARTHROPLASTY Left 04/17/2014    DR LIAM   TOTAL HIP ARTHROPLASTY   2006   TOTAL HIP REVISION Left 04/17/2014    Procedure: LEFT TOTAL HIP REVISION;  Surgeon: Dempsey JINNY Liam, MD;  Location: [facility] OR;  Service: Orthopedics;  Laterality: Left;   TOTAL KNEE ARTHROPLASTY Right 10/30/2017    Procedure: RIGHT TOTAL KNEE ARTHROPLASTY;  Surgeon: Liam Dempsey, MD;  Location: [facility] OR;  Service: Orthopedics;  Laterality: Right;               Family History  Problem Relation Age of Onset   Dementia Mother          dx early to mid 68s died 54   Cancer Mother          breast cancer   Breast cancer Mother 86   Arthritis Mother     Varicose Veins Mother     Heart attack Father     Heart disease Father          MI   Diabetes Father     Hypertension Father     Early death Father     Hyperlipidemia Father     Heart disease Brother     Hypertension Brother     Aneurysm Brother          cardiac    Early death Brother     Colon cancer Cousin            Social History:  reports that she has never smoked. She has never been exposed to tobacco smoke. She has never used smokeless tobacco. She reports that she does not currently use alcohol. She reports that she does not use drugs.   Allergies:  Allergies       Allergies  Allergen Reactions   Metoprolol  Tartrate        Sob with exertion, ? Heart pounding, fatigue and heart pounding even with 12.5 mg bid dosing       Pravastatin         lethargy and fatigue.  After about 4 weeks, I also started feeling heavy heart beating and generally not feeling well.   Diltiazem  Rash       CD formulation likely generalized drug rash/eruption face, trunk, arms    Metoprolol  Succinate [Metoprolol ] Other (See Comments)      Succinate version causes extreme lethargy    Tape Itching, Rash and Other (See Comments)      Reaction:  blisters (adhesive tape) Paper tape is ok        Medications reviewed.       ROS Full ROS performed and is otherwise negative other than what is stated in HPI     BP 137/76   Pulse 81   Ht 5' 2 (1.575 m)   Wt 169 lb (76.7 kg)   SpO2 97%   BMI 30.91 kg/m    Physical Exam  CONSTITUTIONAL: debilitated with severe scoliosis. EYES: Pupils are equal, round,  Sclera are non-icteric. EARS, NOSE, MOUTH AND THROAT: The oropharynx is clear. The oral mucosa is pink and moist. Hearing is intact to voice. Neck Severe kyphosis and scoliosis LYMPH NODES:  Lymph nodes in the neck are normal. RESPIRATORY:  Lungs are clear. There is normal respiratory effort, with equal breath sounds bilaterally, and without pathologic use of accessory muscles. CARDIOVASCULAR: Heart is regular without murmurs, gallops, or rubs. GI: The abdomen is  soft, nontender, and nondistended. There are no palpable masses. There is no hepatosplenomegaly. There are normal bowel sounds in all quadrants. Given her scoliosis there is limited torso space and she has difficulty laying completely flat. GU: Rectal deferred.   MUSCULOSKELETAL: Normal muscle strength and tone. No cyanosis or edema.   SKIN: Turgor is good and there are no pathologic skin lesions or ulcers. NEUROLOGIC: Motor and sensation is grossly normal. Cranial nerves are grossly intact. PSYCH:  Oriented to person, place and time. Affect is normal.     Assessment/Plan: 78 year old female with giant paraesophageal hernia causing both reflux and significant pulmonary compression. She does have severe scoliosis and kyphosis with limited upper body and neck mobility. I did have an extensive d/w the pt about her disease  process.   She understands that this is a complex hernia that won't be easy to fix, she may require prolonged hospitalization and she is at higher risk of perioperative complications.     Think that given her severe kyphosis I will not have enough space to repair the paraesophageal hernia especially given all this complex anatomical variants.  I would encourage her to seek a second opinion at [facility] with Dr. Librada who is also an expert in paraesophageal hernias. An extensive discussion regarding PPIs versus H2 blockers.  She is concerned about PPI and increased risk of C. difficile.  Do think that she will need some sort of gastric protectant to prevent potential complications.  She is in agreement.  She will contact primary care physician    I personally spent a total of 40 minutes in the care of the patient today including performing a medically appropriate exam/evaluation, counseling and educating, placing orders, referring and communicating with other health care professionals, documenting clinical information in the EHR, independently interpreting and reviewing images studies and coordinating care.   Laneta Luna, MD [facility] General Surgeon

## 2023-12-02 NOTE — Patient Instructions (Addendum)
 I will place a referral to [facility] Dr Evalene Greet, they should call you with an appointment, if you do not hear from them their telephone number is [phone]  We will see you back in 6 months for a follow up for hiatal hernia, our schedule is not opened up yet, so we placed you in our recall system and will send you out a letter with a date and time to come see Dr Jordis.    Hiatal Hernia  A hiatal hernia occurs when part of the stomach slides above the muscle that separates the abdomen from the chest (diaphragm). A person can be born with a hiatal hernia (congenital), or it may develop over time. In almost all cases of hiatal hernia, only the top part of the stomach pushes through the diaphragm. Many people have a hiatal hernia with no symptoms. The larger the hernia, the more likely it is that you will have symptoms. In some cases, a hiatal hernia allows stomach acid to flow back into the tube that carries food from your mouth to your stomach (esophagus). This may cause heartburn symptoms. The development of heartburn symptoms may mean that you have a condition called gastroesophageal reflux disease (GERD). What are the causes? This condition is caused by a weakness in the opening (hiatus) where the esophagus passes through the diaphragm to attach to the upper part of the stomach. A person may be born with a weakness in the hiatus, or a weakness can develop over time. What increases the risk? This condition is more likely to develop in: Older people. Age is a major risk factor for a hiatal hernia, especially if you are over the age of 48. Pregnant women. People who are overweight. People who have frequent constipation. What are the signs or symptoms? Symptoms of this condition usually develop in the form of GERD symptoms. Symptoms include: Heartburn. Upset stomach (indigestion). Trouble swallowing. Coughing or wheezing. Wheezing is making high-pitched whistling sounds when you  breathe. Sore throat. Chest pain. Nausea and vomiting. How is this diagnosed? This condition may be diagnosed during testing for GERD. Tests that may be done include: X-rays of your stomach or chest. An upper gastrointestinal (GI) series. This is an X-ray exam of your GI tract that is taken after you swallow a chalky liquid that shows up clearly on the X-ray. Endoscopy. This is a procedure to look into your stomach using a thin, flexible tube that has a tiny camera and light on the end of it. How is this treated? This condition may be treated by: Dietary and lifestyle changes to help reduce GERD symptoms. Medicines. These may include: Over-the-counter antacids. Medicines that make your stomach empty more quickly. Medicines that block the production of stomach acid (H2 blockers). Stronger medicines to reduce stomach acid (proton pump inhibitors). Surgery to repair the hernia, if other treatments are not helping. If you have no symptoms, you may not need treatment. Follow these instructions at home: Lifestyle and activity Do not use any products that contain nicotine or tobacco. These products include cigarettes, chewing tobacco, and vaping devices, such as e-cigarettes. If you need help quitting, ask your health care provider. Try to achieve and maintain a healthy body weight. Avoid putting pressure on your abdomen. Anything that puts pressure on your abdomen increases the amount of acid that may be pushed up into your esophagus. Avoid bending over, especially after eating. Raise the head of your bed by putting blocks under the legs. This keeps your head and  esophagus higher than your stomach. Do not wear tight clothing around your chest or stomach. Try not to strain when having a bowel movement, when urinating, or when lifting heavy objects. Eating and drinking Avoid foods that can worsen GERD symptoms. These may include: Fatty foods, like fried foods. Citrus fruits, like oranges or  lemon. Other foods and drinks that contain acid, like orange juice or tomatoes. Spicy food. Chocolate. Eat frequent small meals instead of three large meals a day. This helps prevent your stomach from getting too full. Eat slowly. Do not lie down right after eating. Do not eat 1-2 hours before bed. Do not drink beverages with caffeine. These include cola, coffee, cocoa, and tea. Do not drink alcohol. General instructions Take over-the-counter and prescription medicines only as told by your health care provider. Keep all follow-up visits. Your health care provider will want to check that any new prescribed medicines are helping your symptoms. Contact a health care provider if: Your symptoms are not controlled with medicines or lifestyle changes. You are having trouble swallowing. You have coughing or wheezing that will not go away. Your pain is getting worse. Your pain spreads to your arms, neck, jaw, teeth, or back. You feel nauseous or you vomit. Get help right away if: You have shortness of breath. You vomit blood. You have bright red blood in your stools. You have black, tarry stools. These symptoms may be an emergency. Get help right away. Call 911. Do not wait to see if the symptoms will go away. Do not drive yourself to the hospital. Summary A hiatal hernia occurs when part of the stomach slides above the muscle that separates the abdomen from the chest. A person may be born with a weakness in the hiatus, or a weakness can develop over time. Symptoms of a hiatal hernia may include heartburn, trouble swallowing, or sore throat. Management of a hiatal hernia includes eating frequent small meals instead of three large meals a day. Get help right away if you vomit blood, have bright red blood in your stools, or have black, tarry stools. This information is not intended to replace advice given to you by your health care provider. Make sure you discuss any questions you have with  your health care provider. Document Revised: 07/02/2021 Document Reviewed: 07/02/2021 Elsevier Patient Education  2024 [employer].

## 2023-12-16 ENCOUNTER — Telehealth: Payer: Self-pay

## 2023-12-16 NOTE — Telephone Encounter (Signed)
 Left message to return call to our office.  Called the pharmacy and let them know that the medication was discontinued and she will no longer get them refilled. Please document when spoke to.

## 2023-12-16 NOTE — Telephone Encounter (Signed)
 Copied from CRM [id number]. Topic: Clinical - Medication Question >> Dec 16, 2023  1:28 PM Taleah C wrote: Reason for CRM: pt called to inform pcp that she is no longer taking Nebivolol  & Famotidine  and would like for it to be removed from her med list. She explained that [facility] has still been refilling the medication and they can only stop refills if the pcp tells them to discontinue it,

## 2023-12-16 NOTE — Telephone Encounter (Unsigned)
 Copied from CRM [id number]. Topic: Clinical - Medication Question >> Dec 16, 2023  4:48 PM Viola F wrote: Reason for CRM: Patient returned [facility] phone call, I let her know that she Called the pharmacy and let them know that the medication was discontinued and she will no longer get them refilled. Patient had no further questions

## 2023-12-23 DIAGNOSIS — M4186 Other forms of scoliosis, lumbar region: Secondary | ICD-10-CM | POA: Diagnosis not present

## 2024-01-05 ENCOUNTER — Ambulatory Visit (INDEPENDENT_AMBULATORY_CARE_PROVIDER_SITE_OTHER)

## 2024-01-05 VITALS — Ht 62.0 in | Wt 166.0 lb

## 2024-01-05 DIAGNOSIS — M419 Scoliosis, unspecified: Secondary | ICD-10-CM | POA: Diagnosis not present

## 2024-01-05 DIAGNOSIS — Z0000 Encounter for general adult medical examination without abnormal findings: Secondary | ICD-10-CM | POA: Diagnosis not present

## 2024-01-05 NOTE — Patient Instructions (Signed)
 Ms. Batchelder , Thank you for taking time out of your busy schedule to complete your Annual Wellness Visit with me. I enjoyed our conversation and look forward to speaking with you again next year. I, as well as your care team,  appreciate your ongoing commitment to your health goals. Please review the following plan we discussed and let me know if I can assist you in the future. Your Game plan/ To Do List    Referrals: If you haven't heard from the office you've been referred to, please reach out to them at the phone provided.  Remember to update you shingles, flu and covid vaccines as discussed.  Follow up Visits: We will see or speak with you next year for your Next Medicare AWV with our clinical staff 01/06/25 @ 11:30 Have you seen your provider in the last 6 months (3 months if uncontrolled diabetes)? Yes  Clinician Recommendations:  Aim for 30 minutes of exercise or brisk walking, 6-8 glasses of water, and 5 servings of fruits and vegetables each day.       This is a list of the screenings recommended for you:  Health Maintenance  Topic Date Due   COVID-19 Vaccine (7 - Moderna risk 2024-25 season) 10/11/2023   Flu Shot  08/16/2024*   Mammogram  02/23/2024   Medicare Annual Wellness Visit  01/04/2025   Colon Cancer Screening  03/22/2028   DTaP/Tdap/Td vaccine (2 - Td or Tdap) 09/29/2029   Pneumococcal Vaccine for age over 41  Completed   DEXA scan (bone density measurement)  Completed   Hepatitis C Screening  Completed   HPV Vaccine  Aged Out   Meningitis B Vaccine  Aged Out   Pneumococcal Vaccine  Discontinued   Zoster (Shingles) Vaccine  Discontinued  *Topic was postponed. The date shown is not the original due date.    Advanced directives: (In Chart) A copy of your advanced directives are scanned into your chart should your provider ever need it. Advance Care Planning is important because it:  [x]  Makes sure you receive the medical care that is consistent with your values,  goals, and preferences  [x]  It provides guidance to your family and loved ones and reduces their decisional burden about whether or not they are making the right decisions based on your wishes.

## 2024-01-05 NOTE — Progress Notes (Signed)
 Subjective:   Kristy Mejia is a 78 y.o. who presents for a Medicare Wellness preventive visit.  As a reminder, Annual Wellness Visits don't include a physical exam, and some assessments may be limited, especially if this visit is performed virtually. We may recommend an in-person follow-up visit with your provider if needed.  Visit Complete: Virtual I connected with  [name] on 01/05/24 by a audio enabled telemedicine application and verified that I am speaking with the correct person using two identifiers.  Patient Location: Home  Provider Location: Home Office  I discussed the limitations of evaluation and management by telemedicine. The patient expressed understanding and agreed to proceed.  Vital Signs: Because this visit was a virtual/telehealth visit, some criteria may be missing or patient reported. Any vitals not documented were not able to be obtained and vitals that have been documented are patient reported.  VideoDeclined- This patient declined Librarian, academic. Therefore the visit was completed with audio only.  Persons Participating in Visit: Patient.  AWV Questionnaire: Yes: Patient Medicare AWV questionnaire was completed by the patient on 01/04/24; I have confirmed that all information answered by patient is correct and no changes since this date.  Cardiac Risk Factors include: advanced age (>59men, >47 women);dyslipidemia;hypertension;obesity (BMI >30kg/m2)     Objective:    Today's Vitals   01/05/24 1418  Weight: 166 lb (75.3 kg)  Height: 5' 2 (1.575 m)   Body mass index is 30.36 kg/m.     01/05/2024    2:29 PM 11/04/2023    8:26 AM 10/09/2022   10:07 AM 10/01/2020    1:02 PM 05/02/2020    4:11 PM 09/29/2019    1:08 PM 08/26/2019    6:25 PM  Advanced Directives  Does Patient Have a Medical Advance Directive? Yes Yes Yes Yes Yes Yes No  Type of Estate agent of [place];Living will  Healthcare Power of  [employer] [place];Living will Healthcare Power of [place];Living will Living will;Healthcare Power of Attorney   Does patient want to make changes to medical advance directive? No - Patient declined   No - Patient declined  No - Patient declined   Copy of Healthcare Power of Attorney in Chart? Yes - validated most recent copy scanned in chart (See row information)  Yes - validated most recent copy scanned in chart (See row information) Yes - validated most recent copy scanned in chart (See row information)  Yes - validated most recent copy scanned in chart (See row information)   Would patient like information on creating a medical advance directive?       No - Patient declined    Current Medications (verified) Outpatient Encounter Medications as of 01/05/2024  Medication Sig   acetaminophen  (TYLENOL ) 325 MG tablet    aspirin  EC 81 MG tablet Take 81 mg by mouth daily.   Cholecalciferol (VITAMIN D3 PO) Take by mouth.   losartan (COZAAR) 50 MG tablet Take 50 mg by mouth daily.   lovastatin  (MEVACOR ) 10 MG tablet Take 1 tablet (10 mg total) by mouth at bedtime.   meloxicam  (MOBIC ) 7.5 MG tablet Take 1 tablet (7.5 mg total) by mouth daily. (Patient taking differently: Take 7.5 mg by mouth daily as needed.)   Multiple Vitamin (MULTIVITAMIN) tablet Take 1 tablet by mouth daily.   pantoprazole  (PROTONIX ) 40 MG tablet Take 1 tablet (40 mg total) by mouth 2 (two) times daily for 14 days, THEN 1 tablet (40 mg total)  daily. Take.   No facility-administered encounter medications on file as of 01/05/2024.    Allergies (verified) Metoprolol  tartrate, Pravastatin , Diltiazem , Metoprolol  succinate [metoprolol ], and Tape   History: Past Medical History:  Diagnosis Date   Arthritis    Atypical chest pain    a. 04/2015 Myoview : EF 78%, breast attenuation, no ischemia-->Low risk.   Back pain    scoliosis   Chicken pox    Dysrhythmia    hx palpitations   GERD (gastroesophageal reflux  disease)    History of hiatal hernia    noted on cxr   Hyperlipidemia    Joint pain    Obesity, unspecified    Paroxysmal SVT (supraventricular tachycardia) (HCC)    a. 2013 Holter: PACs/PVCs; b. 10/2011 Ehco: EF nl, no rwma, mild LVH, mild TR, PASP ; c 04/2015 SVT in ED->resolved with adenosine ; c.    Pneumonia 2010   hx of   Scoliosis    multiple areas of back follows with Dr. India Daring chiropractor    Swine flu    2000s   Past Surgical History:  Procedure Laterality Date   ABDOMINAL HYSTERECTOMY     1990 ovaries intact. had 2/2 fibroids last pap 2004 neg.  ? if cervix present    BACK SURGERY  1960's   d/t scoliosis-1964/65?   BACK SURGERY     x 3    BREAST BIOPSY Left 2008   CORE W/CLIP - NEG   BREAST BIOPSY     2000    BREAST EXCISIONAL BIOPSY Right 20 + yrs ago   BREAST SURGERY  1990   excision breast mass   CARDIAC ELECTROPHYSIOLOGY STUDY AND ABLATION     10/12/19 for DVT [facility]   COLONOSCOPY  2007   Dr. Viktoria   COLONOSCOPY WITH ESOPHAGOGASTRODUODENOSCOPY (EGD) AND ESOPHAGEAL DILATION (ED)     COLONOSCOPY WITH PROPOFOL  N/A 03/22/2018   Procedure: COLONOSCOPY WITH PROPOFOL ;  Surgeon: Viktoria Lamar DASEN, MD;  Location[facility] ENDOSCOPY;  Service: Endoscopy;  Laterality: N/A;   ESOPHAGOGASTRODUODENOSCOPY N/A 11/04/2023   Procedure: EGD (ESOPHAGOGASTRODUODENOSCOPY);  Surgeon: Marinda Jayson KIDD, MD;  Location: [facility] ENDOSCOPY;  Service: General;  Laterality: N/A;   ESOPHAGOGASTRODUODENOSCOPY (EGD) WITH PROPOFOL  N/A 03/22/2018   Procedure: ESOPHAGOGASTRODUODENOSCOPY (EGD) WITH PROPOFOL ;  Surgeon: Viktoria Lamar DASEN, MD;  Location: [facility] ENDOSCOPY;  Service: Endoscopy;  Laterality: N/A;   EYE SURGERY  2007   cataracts   FOOT SURGERY  1960's   JOINT REPLACEMENT     total hip left with metal hardward 2016 Dr. Liam Mosses ortho    OTHER SURGICAL HISTORY  2004   Bilateral cataracts   PARTIAL HYSTERECTOMY  1990   REVISION TOTAL HIP ARTHROPLASTY Left 04/17/2014   DR LIAM    TOTAL HIP ARTHROPLASTY  2006   TOTAL HIP REVISION Left 04/17/2014   Procedure: LEFT TOTAL HIP REVISION;  Surgeon: Dempsey JINNY Liam, MD;  Location: [facility] OR;  Service: Orthopedics;  Laterality: Left;   TOTAL KNEE ARTHROPLASTY Right 10/30/2017   Procedure: RIGHT TOTAL KNEE ARTHROPLASTY;  Surgeon: Liam Dempsey, MD;  Location: [facility] OR;  Service: Orthopedics;  Laterality: Right;   Family History  Problem Relation Age of Onset   Dementia Mother        dx early to mid 81s died 34   Cancer Mother        breast cancer   Breast cancer Mother 58   Arthritis Mother    Varicose Veins Mother    Heart attack Father    Heart disease  Father        MI   Diabetes Father    Hypertension Father    Early death Father    Hyperlipidemia Father    Heart disease Brother    Hypertension Brother    Aneurysm Brother        cardiac    Early death Brother    Colon cancer Cousin    Social History   Socioeconomic History   Marital status: Married    Spouse name: Not on file   Number of children: Not on file   Years of education: Not on file   Highest education level: Master's degree (e.g., MA, MS, MEng, MEd, MSW, MBA)  Occupational History   Not on file  Tobacco Use   Smoking status: Never    Passive exposure: Never   Smokeless tobacco: Never  Vaping Use   Vaping status: Never Used  Substance and Sexual Activity   Alcohol use: Not Currently    Comment: rare   Drug use: No   Sexual activity: Yes    Birth control/protection: Surgical  Other Topics Concern   Not on file  Social History Narrative   Married moved from [place]   No kids    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    Social Drivers of Corporate investment banker Strain: Low Risk  (01/04/2024)   Overall Financial Resource Strain (CARDIA)    Difficulty of Paying Living Expenses: Not hard at all  Food Insecurity: No Food Insecurity (01/04/2024)   Hunger Vital Sign    Worried About Running Out of Food in the Last  Year: Never true    Ran Out of Food in the Last Year: Never true  Transportation Needs: No Transportation Needs (01/04/2024)   PRAPARE - Administrator, Civil Service (Medical): No    Lack of Transportation (Non-Medical): No  Physical Activity: Insufficiently Active (01/04/2024)   Exercise Vital Sign    Days of Exercise per Week: 4 days    Minutes of Exercise per Session: 10 min  Stress: No Stress Concern Present (01/04/2024)   [employer] of Occupational Health - Occupational Stress Questionnaire    Feeling of Stress: Not at all  Social Connections: Socially Integrated (01/04/2024)   Social Connection and Isolation Panel    Frequency of Communication with Friends and Family: Three times a week    Frequency of Social Gatherings with Friends and Family: Once a week    Attends Religious Services: 1 to 4 times per year    Active Member of [employer] or Organizations: Yes    Attends Engineer, structural: More than 4 times per year    Marital Status: Married    Tobacco Counseling Counseling given: Not Answered    Clinical Intake:  Pre-visit preparation completed: Yes  Pain : No/denies pain     BMI - recorded: 30.36 Nutritional Status: BMI > 30  Obese Nutritional Risks: None Diabetes: No  Lab Results  Component Value Date   HGBA1C 6.1 08/04/2022   HGBA1C 6.2 01/30/2022   HGBA1C 6.1 07/30/2021     How often do you need to have someone help you when you read instructions, pamphlets, or other written materials from your doctor or pharmacy?: 1 - Never  Interpreter Needed?: No  Information entered by :: R. Aidenn Skellenger LPN   Activities of Daily Living     01/05/2024    2:19 PM  In your present state of health, do  you have any difficulty performing the following activities:  Hearing? 0  Vision? 0  Difficulty concentrating or making decisions? 0  Walking or climbing stairs? 1  Dressing or bathing? 0  Doing errands, shopping? 0  Preparing Food and eating ?  N  Using the Toilet? N  In the past six months, have you accidently leaked urine? N  Do you have problems with loss of bowel control? N  Managing your Medications? N  Managing your Finances? N  Housekeeping or managing your Housekeeping? N    Patient Care Team: Darron Deatrice LABOR, MD as PCP - Cardiology (Cardiology) Dellie Louanne MATSU, MD (General Surgery) Parris Manna, MD as Consulting Physician (Pulmonary Disease) Florencio Cara BIRCH, MD as Consulting Physician (Cardiology)  I have updated your Care Teams any recent Medical Services you may have received from other providers in the past year.     Assessment:   This is a routine wellness examination for Diavion.  Hearing/Vision screen Hearing Screening - Comments:: No issues Vision Screening - Comments:: glasses   Goals Addressed             This Visit's Progress    Patient Stated       Wants to increase the amount of time that she is physically active       Depression Screen     01/05/2024    2:25 PM 10/29/2023    8:15 AM 03/09/2023   12:53 PM 10/09/2022   10:13 AM 09/03/2022    2:42 PM 08/04/2022   11:12 AM 02/04/2022    8:26 AM  PHQ 2/9 Scores  PHQ - 2 Score 0 0 0 0 0 0 0  PHQ- 9 Score 0 0 0 0 0      Fall Risk     01/05/2024    2:22 PM 10/29/2023    8:15 AM 09/22/2023    1:09 PM 10/09/2022   10:06 AM 10/08/2022    4:14 PM  Fall Risk   Falls in the past year? 0 0 0 1 1  Number falls in past yr: 0 0 0 0 0  Injury with Fall? 0 0 0 0 0  Risk for fall due to : No Fall Risks No Fall Risks     Follow up Falls evaluation completed;Falls prevention discussed Falls evaluation completed  Falls evaluation completed;Education provided;Falls prevention discussed     MEDICARE RISK AT HOME:  Medicare Risk at Home Any stairs in or around the home?: Yes If so, are there any without handrails?: No Home free of loose throw rugs in walkways, pet beds, electrical cords, etc?: Yes Adequate lighting in your home to reduce  risk of falls?: Yes Life alert?: No Use of a cane, walker or w/c?: No Grab bars in the bathroom?: Yes Shower chair or bench in shower?: Yes Elevated toilet seat or a handicapped toilet?: Yes  TIMED UP AND GO:  Was the test performed?  No  Cognitive Function: 6CIT completed    09/24/2017    3:53 PM  MMSE - Mini Mental State Exam  Orientation to time 5  Orientation to Place 5  Registration 3  Attention/ Calculation 5  Recall 3  Language- name 2 objects 2  Language- repeat 1  Language- follow 3 step command 3  Language- read & follow direction 1  Write a sentence 1  Copy design 1  Total score 30        01/05/2024    2:29 PM 10/09/2022   10:09 AM  09/29/2019    1:24 PM 09/28/2018    3:26 PM  6CIT Screen  What Year? 0 points 0 points 0 points 0 points  What month? 0 points 0 points 0 points 0 points  What time? 0 points 0 points 0 points 0 points  Count back from 20 0 points 0 points  0 points  Months in reverse 0 points 0 points 0 points 0 points  Repeat phrase 0 points 0 points  0 points  Total Score 0 points 0 points  0 points    Immunizations Immunization History  Administered Date(s) Administered   Fluad Quad(high Dose 65+) 02/18/2022   Fluad Trivalent(High Dose 65+) 03/09/2023   Moderna Covid-19 Fall Seasonal Vaccine 65yrs & older 04/13/2023   Moderna Covid-19 Vaccine Bivalent Booster 78yrs & up 02/04/2021   Moderna SARS-COV2 Booster Vaccination 09/03/2020   Moderna Sars-Covid-2 Vaccination 06/30/2019, 07/28/2019, 03/29/2020   PNEUMOCOCCAL CONJUGATE-20 02/04/2022   Pfizer(Comirnaty)Fall Seasonal Vaccine 12 years and older 04/08/2022   Tdap 09/30/2019    Screening Tests Health Maintenance  Topic Date Due   Medicare Annual Wellness (AWV)  10/09/2023   COVID-19 Vaccine (7 - Moderna risk 2024-25 season) 10/11/2023   INFLUENZA VACCINE  08/16/2024 (Originally 12/18/2023)   MAMMOGRAM  02/23/2024   Colonoscopy  03/22/2028   DTaP/Tdap/Td (2 - Td or Tdap) 09/29/2029    Pneumococcal Vaccine: 50+ Years  Completed   DEXA SCAN  Completed   Hepatitis C Screening  Completed   HPV VACCINES  Aged Out   Meningococcal B Vaccine  Aged Out   Pneumococcal Vaccine  Discontinued   Zoster Vaccines- Shingrix  Discontinued    Health Maintenance  Health Maintenance Due  Topic Date Due   Medicare Annual Wellness (AWV)  10/09/2023   COVID-19 Vaccine (7 - Moderna risk 2024-25 season) 10/11/2023   Health Maintenance Items Addressed: Discussed the need to update shingles, flu and covid vaccines. Patient wants to discuss with PCP when she should repeat her Dexa  Additional Screening:  Vision Screening: Recommended annual ophthalmology exams for early detection of glaucoma and other disorders of the eye. Up to date  [facility] Would you like a referral to an eye doctor? No    Dental Screening: Recommended annual dental exams for proper oral hygiene  Community Resource Referral / Chronic Care Management: CRR required this visit?  No   CCM required this visit?  No   Plan:    I have personally reviewed and noted the following in the patient's chart:   Medical and social history Use of alcohol, tobacco or illicit drugs  Current medications and supplements including opioid prescriptions. Patient is not currently taking opioid prescriptions. Functional ability and status Nutritional status Physical activity Advanced directives List of other physicians Hospitalizations, surgeries, and ER visits in previous 12 months Vitals Screenings to include cognitive, depression, and falls Referrals and appointments  In addition, I have reviewed and discussed with patient certain preventive protocols, quality metrics, and best practice recommendations. A written personalized care plan for preventive services as well as general preventive health recommendations were provided to patient.   Angeline Fredericks, LPN   [id number]/[id number]   After Visit Summary: (MyChart) Due to this being  a telephonic visit, the after visit summary with patients personalized plan was offered to patient via MyChart   Notes: Nothing significant to report at this time.

## 2024-01-06 DIAGNOSIS — R200 Anesthesia of skin: Secondary | ICD-10-CM | POA: Diagnosis not present

## 2024-01-06 DIAGNOSIS — G5601 Carpal tunnel syndrome, right upper limb: Secondary | ICD-10-CM | POA: Diagnosis not present

## 2024-01-06 DIAGNOSIS — R202 Paresthesia of skin: Secondary | ICD-10-CM | POA: Diagnosis not present

## 2024-01-09 ENCOUNTER — Other Ambulatory Visit: Payer: Self-pay

## 2024-01-09 DIAGNOSIS — K449 Diaphragmatic hernia without obstruction or gangrene: Secondary | ICD-10-CM

## 2024-01-12 ENCOUNTER — Ambulatory Visit

## 2024-01-12 VITALS — BP 132/74 | HR 71 | Temp 98.7°F | Ht 62.0 in | Wt 164.2 lb

## 2024-01-12 DIAGNOSIS — M65341 Trigger finger, right ring finger: Secondary | ICD-10-CM

## 2024-01-12 DIAGNOSIS — R7303 Prediabetes: Secondary | ICD-10-CM | POA: Diagnosis not present

## 2024-01-12 DIAGNOSIS — M150 Primary generalized (osteo)arthritis: Secondary | ICD-10-CM | POA: Diagnosis not present

## 2024-01-12 DIAGNOSIS — R202 Paresthesia of skin: Secondary | ICD-10-CM

## 2024-01-12 DIAGNOSIS — K449 Diaphragmatic hernia without obstruction or gangrene: Secondary | ICD-10-CM | POA: Diagnosis not present

## 2024-01-12 DIAGNOSIS — R269 Unspecified abnormalities of gait and mobility: Secondary | ICD-10-CM | POA: Diagnosis not present

## 2024-01-12 DIAGNOSIS — I700 Atherosclerosis of aorta: Secondary | ICD-10-CM | POA: Diagnosis not present

## 2024-01-12 DIAGNOSIS — G5601 Carpal tunnel syndrome, right upper limb: Secondary | ICD-10-CM

## 2024-01-12 DIAGNOSIS — E782 Mixed hyperlipidemia: Secondary | ICD-10-CM

## 2024-01-12 DIAGNOSIS — I10 Essential (primary) hypertension: Secondary | ICD-10-CM

## 2024-01-12 DIAGNOSIS — K219 Gastro-esophageal reflux disease without esophagitis: Secondary | ICD-10-CM

## 2024-01-12 DIAGNOSIS — R200 Anesthesia of skin: Secondary | ICD-10-CM

## 2024-01-12 MED ORDER — PANTOPRAZOLE SODIUM 40 MG PO TBEC: 40.0000 mg | DELAYED_RELEASE_TABLET | ORAL | 0 refills | Status: AC

## 2024-01-12 MED ORDER — LOVASTATIN 10 MG PO TABS: 10.0000 mg | ORAL_TABLET | Freq: Every day | ORAL | 3 refills | Status: AC

## 2024-01-12 MED ORDER — DICLOFENAC SODIUM 1 % EX GEL: 4.0000 g | Freq: Four times a day (QID) | CUTANEOUS | 4 refills | Status: AC

## 2024-01-12 NOTE — Assessment & Plan Note (Signed)
 F/U and managed by [facility].

## 2024-01-12 NOTE — Assessment & Plan Note (Signed)
 Likely from scoliosis, continue follow up with ortho, physical therapy as recommended by ortho. Limit or avoid use of oral NSAIDs if possible in the context of hiatal hernia.

## 2024-01-12 NOTE — Progress Notes (Signed)
 Established Patient Office Visit TOC from Dr. Hope   Subjective  Patient ID: [name], female    DOB: 02/17/1946  Age: 78 y.o. MRN: [id number]  Chief Complaint  Patient presents with   Establish Care    She  has a past medical history of Allergy (Jan 2019; June 2020), Arthritis, Atypical chest pain, Back pain, Cataract (early 2000s), Chicken pox, Dysrhythmia, GERD (gastroesophageal reflux disease), History of hiatal hernia, Hyperlipidemia, Joint pain, Obesity, unspecified, Paroxysmal SVT (supraventricular tachycardia) (HCC), Pneumonia (2010), Scoliosis, and Swine flu.  HPI Discussed the use of AI scribe software for clinical note transcription with the patient, who gave verbal consent to proceed.  History of Present Illness Kristy Mejia is a 78 year old female with a giant hiatal hernia, scoliosis, hypertension, hyperlipidemia, lower back pain, b/l knee pain, carpel tunnel right, trigger finger right ring presenting for TOC.  - Patient was last seen by me on 10/29/23 for symptomatic hiatal hernia. She is currently on Protonix  40 mg daily. She was referred to GI and has appointment with [facility] in 05/2023. She reports no GI symptoms from hiatal hernia.   - She takes PRN Meloxicam  7.5 mg once weekly for lower back pain, knee pain. She sees [facility]. She also reports of occasional lower back muscle spasms that is better with taking Meloxicam . She also takes Tylenol  650 mg regularly, daily. She was referred to PT during her last visit on 01/05/24 by ortho.   - She sees Dr. Aleskerov at [facility] for atelectasis induced dyspnea from hiatal hernia. She has been exercising and does breathing exercise daily.   - Her scoliosis is a concern as it may limit the space available to reposition her stomach. An orthopedic consultation advised against surgery to straighten her spine due to high risks. She is established with general surgery at [facility] and has a second opinion visit with surgeon at  [facility] on 03/14/24.   - She experiences trigger finger of right index finger and and carpal tunnel syndrome on right. She has seen [facility] and follows up with them.   - She takes lovastatin  four times a week for cholesterol management and losartan for blood pressure, which was prescribed after experiencing shortness of breath with previous medications (betablocker). No current fatigue or chest pain. She sees cardiologist at [facility].   - She is active in gardening and avoids heavy lifting that causes pain but otherwise remains active.  ROS As per HPI    Objective:     BP 132/74 (BP Location: Right Arm, Patient Position: Sitting, Cuff Size: Normal)   Pulse 71   Temp 98.7 F (37.1 C) (Oral)   Ht 5' 2 (1.575 m)   Wt 164 lb 3.2 oz (74.5 kg)   SpO2 96%   BMI 30.03 kg/m      01/12/2024    1:11 PM 01/05/2024    2:25 PM 10/29/2023    8:15 AM  Depression screen PHQ 2/9  Decreased Interest 0 0 0  Down, Depressed, Hopeless 0 0 0  PHQ - 2 Score 0 0 0  Altered sleeping 0 0 0  Tired, decreased energy 0 0 0  Change in appetite 0 0 0  Feeling bad or failure about yourself  0 0 0  Trouble concentrating 0 0 0  Moving slowly or fidgety/restless 0 0 0  Suicidal thoughts 0 0 0  PHQ-9 Score 0 0 0  Difficult doing work/chores Not difficult at all Not  difficult at all Not difficult at all      01/12/2024    1:11 PM 10/29/2023    8:15 AM 03/09/2023   12:53 PM 09/03/2022    2:42 PM  GAD 7 : Generalized Anxiety Score  Nervous, Anxious, on Edge 0 0 0 0  Control/stop worrying 0 0 0 0  Worry too much - different things 0 0 0 0  Trouble relaxing 0 0 0 0  Restless 0 0 0 0  Easily annoyed or irritable 0 0 0 0  Afraid - awful might happen 0 0 0 0  Total GAD 7 Score 0 0 0 0  Anxiety Difficulty Not difficult at all Not difficult at all Not difficult at all Not difficult at all      01/12/2024    1:11 PM 01/05/2024    2:25 PM 10/29/2023    8:15 AM  Depression screen PHQ 2/9   Decreased Interest 0 0 0  Down, Depressed, Hopeless 0 0 0  PHQ - 2 Score 0 0 0  Altered sleeping 0 0 0  Tired, decreased energy 0 0 0  Change in appetite 0 0 0  Feeling bad or failure about yourself  0 0 0  Trouble concentrating 0 0 0  Moving slowly or fidgety/restless 0 0 0  Suicidal thoughts 0 0 0  PHQ-9 Score 0 0 0  Difficult doing work/chores Not difficult at all Not difficult at all Not difficult at all      01/12/2024    1:11 PM 10/29/2023    8:15 AM 03/09/2023   12:53 PM 09/03/2022    2:42 PM  GAD 7 : Generalized Anxiety Score  Nervous, Anxious, on Edge 0 0 0 0  Control/stop worrying 0 0 0 0  Worry too much - different things 0 0 0 0  Trouble relaxing 0 0 0 0  Restless 0 0 0 0  Easily annoyed or irritable 0 0 0 0  Afraid - awful might happen 0 0 0 0  Total GAD 7 Score 0 0 0 0  Anxiety Difficulty Not difficult at all Not difficult at all Not difficult at all Not difficult at all   SDOH Screenings   Food Insecurity: No Food Insecurity (01/04/2024)  Housing: Low Risk  (01/04/2024)  Transportation Needs: No Transportation Needs (01/04/2024)  Utilities: Not At Risk (01/05/2024)  Alcohol Screen: Low Risk  (01/04/2024)  Depression (PHQ2-9): Low Risk  (01/12/2024)  Financial Resource Strain: Low Risk  (01/04/2024)  Physical Activity: Insufficiently Active (01/04/2024)  Social Connections: Socially Integrated (01/04/2024)  Stress: No Stress Concern Present (01/04/2024)  Tobacco Use: Low Risk  (01/12/2024)  Health Literacy: Adequate Health Literacy (01/05/2024)     Physical Exam Constitutional:      General: She is not in acute distress. HENT:     Head: Normocephalic and atraumatic.     Right Ear: Tympanic membrane normal.     Left Ear: Tympanic membrane normal.     Mouth/Throat:     Mouth: Mucous membranes are moist.     Pharynx: No oropharyngeal exudate.  Cardiovascular:     Rate and Rhythm: Normal rate.     Pulses: Normal pulses.  Pulmonary:     Effort: Pulmonary  effort is normal.     Breath sounds: Normal breath sounds. No wheezing or rales.  Abdominal:     General: Bowel sounds are normal.     Palpations: Abdomen is soft.     Tenderness: There is no guarding.  Musculoskeletal:     Cervical back: Neck supple.     Thoracic back: Scoliosis present.     Right lower leg: No edema.     Left lower leg: No edema.     Comments: Thoracic kyphosis   Skin:    General: Skin is warm.  Neurological:     Mental Status: She is alert and oriented to person, place, and time.     Gait: Gait abnormal (gait asymetry, with trunchal shift from scoliosis).  Psychiatric:        Mood and Affect: Mood normal.        No results found for any visits on 01/12/24.  The 10-year ASCVD risk score (Arnett DK, et al., 2019) is: 16.7%     Assessment & Plan:   Mixed hyperlipidemia -     Lovastatin ; Take 1 tablet (10 mg total) by mouth at bedtime.  Dispense: 90 tablet; Refill: 3 -     Lipid panel; Future  Hiatal hernia with GERD Assessment & Plan: Taper down of Protonix  from 40 mg daily to every other day. GI symptoms stable on daily 40 mg PPI. Check B12 during f/u visit. Keep appointment with GI as scheduled for 05/2024 at [facility]. If GI symptoms reoccurs with taking Protonix  every other day, she can go back to taking Protonix  40 mg everyday.   Orders: -     Pantoprazole  Sodium; Take 1 tablet (40 mg total) by mouth every other day. Please keep in file till patient calls for a refill  Dispense: 180 tablet; Refill: 0 -     Vitamin B12; Future  Primary osteoarthritis involving multiple joints Assessment & Plan: B/L shoulders, knees, right hand, wrist.  Recommend avoiding NSAIDs as long as possible given history of hiatal hernia.  Use Diclofenac  gel, up to 4 times a day to bilateral knees, lower back, shoulders prn to help with pain.  Try heating pad prn. Stay active.  Use Tylenol  650 mg every 8 hourly as needed. Continue follow up with ortho.      Orders: -     Diclofenac  Sodium; Apply 4 g topically 4 (four) times daily.  Dispense: 150 g; Refill: 4  Prediabetes Assessment & Plan: Check A1c before her f/u visit in 6 months, future lab ordered.   Orders: -     Hemoglobin A1c; Future  Primary hypertension Assessment & Plan: BP stable with Losartan 50 mg daily as prescribed by cardiologist at [facility].  Did not tolerate BB in the past (caused dyspnea worsening fatigue)  Orders: -     Comprehensive metabolic panel with GFR; Future  Abnormal gait Assessment & Plan: Likely from scoliosis, continue follow up with ortho, physical therapy as recommended by ortho. Limit or avoid use of oral NSAIDs if possible in the context of hiatal hernia.    Acquired trigger finger of right ring finger Assessment & Plan: F/U and managed by [facility].    Aortic atherosclerosis (HCC) Assessment & Plan: On Lovastatin  10 mg, take it every other day due to intolerance to high intensity statin. Obtain fasting lipid panel before visit with me in 6 months. Check CMP before next visit.    Carpal tunnel syndrome of right wrist Assessment & Plan: Managed by [facility], continue follow up   Numbness and tingling Assessment & Plan: Right fingers due to carpel tunnel, continue f/u and management per [facility].    I personally spent a total of 50 minutes in the care of the  patient today including preparing to see the patient, performing a medically appropriate exam/evaluation, counseling and educating, placing orders, and documenting clinical information in the EHR.   Return in about 6 months (around 07/14/2024) for chronic follow up, fasting labs 2 days before apt.   Luke Shade, MD

## 2024-01-12 NOTE — Assessment & Plan Note (Signed)
 Recommend avoiding NSAIDs as long as possible given history of hiatal hernia.  Use Diclofenac  gel, up to 4 times a day to bilateral knees, lower back, shoulders prn to help with pain.  Try heating pad prn. Stay active.  Use Tylenol  650 mg every 8 hourly as needed. Continue follow up with ortho.

## 2024-01-12 NOTE — Telephone Encounter (Signed)
 You put a stat date of 10/29/23 with an end date of 01/11/24 so please refuse.

## 2024-01-12 NOTE — Assessment & Plan Note (Signed)
 Check A1c before her f/u visit in 6 months, future lab ordered.

## 2024-01-12 NOTE — Assessment & Plan Note (Signed)
 B/L shoulders, knees, right hand, wrist.  Recommend avoiding NSAIDs as long as possible given history of hiatal hernia.  Use Diclofenac  gel, up to 4 times a day to bilateral knees, lower back, shoulders prn to help with pain.  Try heating pad prn. Stay active.  Use Tylenol  650 mg every 8 hourly as needed. Continue follow up with ortho.

## 2024-01-12 NOTE — Assessment & Plan Note (Signed)
 Taper down of Protonix  from 40 mg daily to every other day. GI symptoms stable on daily 40 mg PPI. Check B12 during f/u visit. Keep appointment with GI as scheduled for 05/2024 at [facility]. If GI symptoms reoccurs with taking Protonix  every other day, she can go back to taking Protonix  40 mg everyday.

## 2024-01-12 NOTE — Assessment & Plan Note (Signed)
 Right fingers due to carpel tunnel, continue f/u and management per [facility].

## 2024-01-12 NOTE — Assessment & Plan Note (Signed)
 Chronic, stable

## 2024-01-12 NOTE — Assessment & Plan Note (Signed)
 On Lovastatin  10 mg, take it every other day due to intolerance to high intensity statin. Obtain fasting lipid panel before visit with me in 6 months. Check CMP before next visit.

## 2024-01-12 NOTE — Assessment & Plan Note (Signed)
 Managed by [facility], continue follow up

## 2024-01-12 NOTE — Patient Instructions (Addendum)
--   Apply Diclofenac  gel, up to four times a day to back, knees, hands, shoulders when you have pain. You can also try heating pad. Try Tylenol  650 mg, every 8 hourly when pain is bad.   -- Take Protonix  every other day on an empty stomach.   -- Take Lovastatin  every other day .

## 2024-01-12 NOTE — Assessment & Plan Note (Signed)
 BP stable with Losartan 50 mg daily as prescribed by cardiologist at [facility].  Did not tolerate BB in the past (caused dyspnea worsening fatigue)

## 2024-01-25 ENCOUNTER — Other Ambulatory Visit: Payer: Self-pay

## 2024-01-25 DIAGNOSIS — Z1231 Encounter for screening mammogram for malignant neoplasm of breast: Secondary | ICD-10-CM

## 2024-01-25 DIAGNOSIS — G5601 Carpal tunnel syndrome, right upper limb: Secondary | ICD-10-CM | POA: Diagnosis not present

## 2024-01-25 DIAGNOSIS — M65341 Trigger finger, right ring finger: Secondary | ICD-10-CM | POA: Diagnosis not present

## 2024-01-25 DIAGNOSIS — R202 Paresthesia of skin: Secondary | ICD-10-CM | POA: Diagnosis not present

## 2024-01-25 DIAGNOSIS — R200 Anesthesia of skin: Secondary | ICD-10-CM | POA: Diagnosis not present

## 2024-02-04 DIAGNOSIS — R200 Anesthesia of skin: Secondary | ICD-10-CM | POA: Diagnosis not present

## 2024-02-04 DIAGNOSIS — R202 Paresthesia of skin: Secondary | ICD-10-CM | POA: Diagnosis not present

## 2024-02-04 DIAGNOSIS — M65341 Trigger finger, right ring finger: Secondary | ICD-10-CM | POA: Diagnosis not present

## 2024-02-04 DIAGNOSIS — G5601 Carpal tunnel syndrome, right upper limb: Secondary | ICD-10-CM | POA: Diagnosis not present

## 2024-02-17 DIAGNOSIS — E66811 Obesity, class 1: Secondary | ICD-10-CM | POA: Diagnosis not present

## 2024-02-17 DIAGNOSIS — I2510 Atherosclerotic heart disease of native coronary artery without angina pectoris: Secondary | ICD-10-CM | POA: Diagnosis not present

## 2024-02-17 DIAGNOSIS — R011 Cardiac murmur, unspecified: Secondary | ICD-10-CM | POA: Diagnosis not present

## 2024-02-17 DIAGNOSIS — Z9889 Other specified postprocedural states: Secondary | ICD-10-CM | POA: Diagnosis not present

## 2024-02-17 DIAGNOSIS — I700 Atherosclerosis of aorta: Secondary | ICD-10-CM | POA: Diagnosis not present

## 2024-02-17 DIAGNOSIS — E782 Mixed hyperlipidemia: Secondary | ICD-10-CM | POA: Diagnosis not present

## 2024-02-17 DIAGNOSIS — I38 Endocarditis, valve unspecified: Secondary | ICD-10-CM | POA: Diagnosis not present

## 2024-02-17 DIAGNOSIS — I10 Essential (primary) hypertension: Secondary | ICD-10-CM | POA: Diagnosis not present

## 2024-02-17 DIAGNOSIS — I471 Supraventricular tachycardia, unspecified: Secondary | ICD-10-CM | POA: Diagnosis not present

## 2024-02-19 DIAGNOSIS — M65341 Trigger finger, right ring finger: Secondary | ICD-10-CM | POA: Diagnosis not present

## 2024-02-19 DIAGNOSIS — R202 Paresthesia of skin: Secondary | ICD-10-CM | POA: Diagnosis not present

## 2024-02-19 DIAGNOSIS — R200 Anesthesia of skin: Secondary | ICD-10-CM | POA: Diagnosis not present

## 2024-02-19 DIAGNOSIS — M150 Primary generalized (osteo)arthritis: Secondary | ICD-10-CM | POA: Diagnosis not present

## 2024-02-19 DIAGNOSIS — G5601 Carpal tunnel syndrome, right upper limb: Secondary | ICD-10-CM | POA: Diagnosis not present

## 2024-02-24 ENCOUNTER — Ambulatory Visit
Admission: RE | Admit: 2024-02-24 | Discharge: 2024-02-24 | Disposition: A | Discharge status: Home / Self Care | Source: Ambulatory Visit

## 2024-02-24 DIAGNOSIS — Z1231 Encounter for screening mammogram for malignant neoplasm of breast: Secondary | ICD-10-CM | POA: Diagnosis not present

## 2024-02-26 ENCOUNTER — Other Ambulatory Visit: Payer: Self-pay

## 2024-02-26 DIAGNOSIS — R928 Other abnormal and inconclusive findings on diagnostic imaging of breast: Secondary | ICD-10-CM

## 2024-03-01 ENCOUNTER — Ambulatory Visit
Admission: RE | Admit: 2024-03-01 | Discharge: 2024-03-01 | Disposition: A | Discharge status: Home / Self Care | Source: Ambulatory Visit

## 2024-03-01 DIAGNOSIS — R92322 Mammographic fibroglandular density, left breast: Secondary | ICD-10-CM | POA: Diagnosis not present

## 2024-03-01 DIAGNOSIS — R928 Other abnormal and inconclusive findings on diagnostic imaging of breast: Secondary | ICD-10-CM | POA: Diagnosis not present

## 2024-03-02 DIAGNOSIS — M6281 Muscle weakness (generalized): Secondary | ICD-10-CM | POA: Diagnosis not present

## 2024-03-02 DIAGNOSIS — G5601 Carpal tunnel syndrome, right upper limb: Secondary | ICD-10-CM | POA: Diagnosis not present

## 2024-03-02 DIAGNOSIS — R202 Paresthesia of skin: Secondary | ICD-10-CM | POA: Diagnosis not present

## 2024-03-02 DIAGNOSIS — R200 Anesthesia of skin: Secondary | ICD-10-CM | POA: Diagnosis not present

## 2024-03-02 DIAGNOSIS — M65341 Trigger finger, right ring finger: Secondary | ICD-10-CM | POA: Diagnosis not present

## 2024-03-02 DIAGNOSIS — M79641 Pain in right hand: Secondary | ICD-10-CM | POA: Diagnosis not present

## 2024-03-02 DIAGNOSIS — Z4789 Encounter for other orthopedic aftercare: Secondary | ICD-10-CM | POA: Diagnosis not present

## 2024-03-02 DIAGNOSIS — M150 Primary generalized (osteo)arthritis: Secondary | ICD-10-CM | POA: Diagnosis not present

## 2024-03-03 ENCOUNTER — Ambulatory Visit: Payer: Self-pay

## 2024-03-03 ENCOUNTER — Ambulatory Visit
Admission: RE | Admit: 2024-03-03 | Discharge: 2024-03-03 | Disposition: A | Discharge status: Home / Self Care | Source: Ambulatory Visit

## 2024-03-03 DIAGNOSIS — R928 Other abnormal and inconclusive findings on diagnostic imaging of breast: Secondary | ICD-10-CM

## 2024-03-03 DIAGNOSIS — R921 Mammographic calcification found on diagnostic imaging of breast: Secondary | ICD-10-CM | POA: Diagnosis not present

## 2024-03-03 DIAGNOSIS — N6323 Unspecified lump in the left breast, lower outer quadrant: Secondary | ICD-10-CM | POA: Insufficient documentation

## 2024-03-03 HISTORY — PX: BREAST BIOPSY: SHX20

## 2024-03-03 MED ORDER — LIDOCAINE-EPINEPHRINE 1 %-1:100000 IJ SOLN
20.0000 mL | Freq: Once | INTRAMUSCULAR | Status: AC
  Administered 2024-03-03: 20 mL
  Filled 2024-03-03: qty 20

## 2024-03-03 MED ORDER — LIDOCAINE 1 % OPTIME INJ - NO CHARGE
5.0000 mL | Freq: Once | INTRAMUSCULAR | Status: AC
  Administered 2024-03-03: 5 mL
  Filled 2024-03-03: qty 6

## 2024-03-03 NOTE — Progress Notes (Signed)
 [facility]: Just an FYI in case patient reaches out. I left a VM to the patient on 03/03/24 about abnormal mammogram and pending biopsy result. No further recommendations at this time, pending left breast biopsy result from 03/01/24. Further management pending based on biopsy result.   Luke Shade, MD

## 2024-03-04 LAB — SURGICAL PATHOLOGY

## 2024-03-05 ENCOUNTER — Ambulatory Visit: Payer: Self-pay

## 2024-03-14 DIAGNOSIS — K449 Diaphragmatic hernia without obstruction or gangrene: Secondary | ICD-10-CM | POA: Diagnosis not present

## 2024-03-24 DIAGNOSIS — H526 Other disorders of refraction: Secondary | ICD-10-CM | POA: Diagnosis not present

## 2024-03-24 DIAGNOSIS — Z961 Presence of intraocular lens: Secondary | ICD-10-CM | POA: Diagnosis not present

## 2024-04-18 DIAGNOSIS — M419 Scoliosis, unspecified: Secondary | ICD-10-CM | POA: Diagnosis not present

## 2024-04-18 DIAGNOSIS — M545 Low back pain, unspecified: Secondary | ICD-10-CM | POA: Diagnosis not present

## 2024-05-30 ENCOUNTER — Ambulatory Visit: Admitting: Surgery

## 2024-05-30 ENCOUNTER — Encounter: Payer: Self-pay | Admitting: Surgery

## 2024-05-30 VITALS — BP 132/77 | HR 87 | Ht 62.0 in | Wt 163.0 lb

## 2024-05-30 DIAGNOSIS — K449 Diaphragmatic hernia without obstruction or gangrene: Secondary | ICD-10-CM

## 2024-05-30 NOTE — Patient Instructions (Signed)
 Please give our office a call if you have any questions or concerns

## 2024-05-30 NOTE — Progress Notes (Signed)
 Outpatient Surgical Follow Up  05/30/2024  Kristy Mejia is an 79 y.o. female.   Chief Complaint  Patient presents with   Follow-up    Hiatal Hernia     HPI:  Kristy Mejia is a 79 y.o. female seen in F/U giant H/H hernia..  Underwent recent cardiac ct that have personally reviewed showing a giant paraesophageal hernia with the entire stomach in addition to large bowel.  Significant kyphosis and significant compression of the MEDIASTINUM by hernia contents . She does have a significant history of GERD, paroxysmal SVT as well as significant shortness of breath.  She has been evaluated by both cardiology and pulmonary.  Pulmonary recommending repair of the hernia due to significant pulmonary issues She does have dyspnea on exertion, she is very independent , still drives, no cognitive impairment. She has a hx spine issues with kyphosis and has had 4 major back surgeries. Her mind is pristine, she lives with her husband. She experiences some intermittent dysphagia and some reflux as well Prior abdominal hysterectomy  Now is here with the husband.  I have pers reviewed images including the CT and barium swallow showing a giant paraesophageal hernia with the entire stomach within the mediastinum and also colon within the mediastinum. Some dysmotility She wnet to [facility] and was scheduled for surgery last month and had to postpone surgery, She comes back to find out about my opinion regarding the repair.  Past Medical History:  Diagnosis Date   Allergy Jan 2019; June 2020   Diltiazem ; Metropolol   Arthritis    Atypical chest pain    a. 04/2015 Myoview : EF 78%, breast attenuation, no ischemia-->Low risk.   Back pain    scoliosis   Cataract early 2000s   removed from both eyes   Chicken pox    Dysrhythmia    hx palpitations   GERD (gastroesophageal reflux disease)    History of hiatal hernia    noted on cxr   Hyperlipidemia    Joint pain    Obesity, unspecified    Paroxysmal SVT  (supraventricular tachycardia)    a. 2013 Holter: PACs/PVCs; b. 10/2011 Ehco: EF nl, no rwma, mild LVH, mild TR, PASP ; c 04/2015 SVT in ED->resolved with adenosine ; c.    Pneumonia 2010   hx of   Scoliosis    multiple areas of back follows with Dr. India Daring chiropractor    Swine flu    2000s    Past Surgical History:  Procedure Laterality Date   ABDOMINAL HYSTERECTOMY     1990 ovaries intact. had 2/2 fibroids last pap 2004 neg.  ? if cervix present    BACK SURGERY  1960's   d/t scoliosis-1964/65?   BACK SURGERY     x 3    BREAST BIOPSY Left 2008   CORE W/CLIP - NEG   BREAST BIOPSY     2000    BREAST BIOPSY Left 03/03/2024   MM LT BREAST BX W LOC DEV 1ST LESION IMAGE BX SPEC STEREO GUIDE 03/03/2024 [facility]   BREAST EXCISIONAL BIOPSY Right 20 + yrs ago   BREAST SURGERY  1990   excision breast mass   CARDIAC ELECTROPHYSIOLOGY STUDY AND ABLATION     10/12/19 for DVT [facility]   COLONOSCOPY  2007   Dr. Viktoria   COLONOSCOPY WITH ESOPHAGOGASTRODUODENOSCOPY (EGD) AND ESOPHAGEAL DILATION (ED)     COLONOSCOPY WITH PROPOFOL  N/A 03/22/2018   Procedure: COLONOSCOPY WITH PROPOFOL ;  Surgeon: Viktoria Lamar DASEN, MD;  Location[facility]  ENDOSCOPY;  Service: Endoscopy;  Laterality: N/A;   ESOPHAGOGASTRODUODENOSCOPY N/A 11/04/2023   Procedure: EGD (ESOPHAGOGASTRODUODENOSCOPY);  Surgeon: Marinda Jayson KIDD, MD;  Location: [facility] ENDOSCOPY;  Service: General;  Laterality: N/A;   ESOPHAGOGASTRODUODENOSCOPY (EGD) WITH PROPOFOL  N/A 03/22/2018   Procedure: ESOPHAGOGASTRODUODENOSCOPY (EGD) WITH PROPOFOL ;  Surgeon: Viktoria Lamar DASEN, MD;  Location: [facility] ENDOSCOPY;  Service: Endoscopy;  Laterality: N/A;   EYE SURGERY  2007   cataracts   FOOT SURGERY  1960's   JOINT REPLACEMENT     total hip left with metal hardward 2016 Dr. Liam Mosses ortho    OTHER SURGICAL HISTORY  2004   Bilateral cataracts   PARTIAL HYSTERECTOMY  1990   REVISION TOTAL HIP ARTHROPLASTY Left 04/17/2014   DR [facility]  SURGERY  1964, 1965, 1966   3x for scoliosis   TOTAL HIP ARTHROPLASTY  2006   TOTAL HIP REVISION Left 04/17/2014   Procedure: LEFT TOTAL HIP REVISION;  Surgeon: Dempsey JINNY Liam, MD;  Location: [facility] OR;  Service: Orthopedics;  Laterality: Left;   TOTAL KNEE ARTHROPLASTY Right 10/30/2017   Procedure: RIGHT TOTAL KNEE ARTHROPLASTY;  Surgeon: Liam Dempsey, MD;  Location: [facility] OR;  Service: Orthopedics;  Laterality: Right;    Family History  Problem Relation Age of Onset   Dementia Mother        dx early to mid 44s died 10   Cancer Mother        breast cancer   Breast cancer Mother 85   Arthritis Mother    Varicose Veins Mother    Heart attack Father    Heart disease Father        MI   Diabetes Father    Hypertension Father    Early death Father    Hyperlipidemia Father    Heart disease Brother    Hypertension Brother    Aneurysm Brother        cardiac    Early death Brother    Colon cancer Cousin     Social History:  reports that she has never smoked. She has never been exposed to tobacco smoke. She has never used smokeless tobacco. She reports that she does not currently use alcohol. She reports that she does not use drugs.  Allergies: Allergies[1]  Medications reviewed.    ROS Full ROS performed and is otherwise negative other than what is stated in HPI   BP 132/77   Pulse 87   Ht 5' 2 (1.575 m)   Wt 163 lb (73.9 kg)   SpO2 97%   BMI 29.81 kg/m   Physical Exam  CONSTITUTIONAL: debilitated with severe scoliosis. EYES: Pupils are equal, round,  Sclera are non-icteric. EARS, NOSE, MOUTH AND THROAT: The oropharynx is clear. The oral mucosa is pink and moist. Hearing is intact to voice. Neck Severe kyphosis and scoliosis LYMPH NODES:  Lymph nodes in the neck are normal. RESPIRATORY:  Lungs are clear. There is normal respiratory effort, with equal breath sounds bilaterally, and without pathologic use of accessory muscles. CARDIOVASCULAR: Heart is regular without  murmurs, gallops, or rubs. GI: The abdomen is  soft, nontender, and nondistended. There are no palpable masses. There is no hepatosplenomegaly. There are normal bowel sounds in all quadrants. Given her scoliosis there is limited torso and abdominal space and she has difficulty laying completely flat. Not much space between pelvic ASIS and lower rib cage margin GU: Rectal deferred.   MUSCULOSKELETAL: Normal muscle strength and tone. No cyanosis or edema.   SKIN:  Turgor is good and there are no pathologic skin lesions or ulcers. NEUROLOGIC: Motor and sensation is grossly normal. Cranial nerves are grossly intact. PSYCH:  Oriented to person, place and time. Affect is normal.   Assessment/Plan: 79 year old female with giant paraesophageal hernia causing both reflux and significant pulmonary compression. She does have severe scoliosis and kyphosis with limited upper body, abdominal wall and neck mobility. I did have an extensive d/w the pt about her disease process.   She understands that this is a complex hernia that won't be easy to fix, she may require prolonged hospitalization and she is at higher risk of perioperative complications.     I Think that given her severe kyphosis I will not have enough space to robotically repair the paraesophageal hernia especially given all this complex anatomical variants.    She will follow back again with Dr. Librada who is also an expert in paraesophageal hernias. I answer all questions for potential complications. I encouraged her to go back to [facility] for further preop discussions and she is in agreement  I personally spent a total of 40 minutes in the care of the patient today including performing a medically appropriate exam/evaluation, counseling and educating, placing orders, referring and communicating with other health care professionals, documenting clinical information in the EHR, independently interpreting and reviewing images studies and coordinating care.    Laneta Luna, MD FACS General Surgeon    [1]  Allergies Allergen Reactions   Metoprolol  Tartrate     Sob with exertion, ? Heart pounding, fatigue and heart pounding even with 12.5 mg bid dosing     Pravastatin      lethargy and fatigue.  After about 4 weeks, I also started feeling heavy heart beating and generally not feeling well.   Diltiazem  Rash    CD formulation likely generalized drug rash/eruption face, trunk, arms    Metoprolol  Succinate [Metoprolol ] Other (See Comments)    Succinate version causes extreme lethargy    Tape Itching, Rash and Other (See Comments)    Reaction: blisters (adhesive tape) Paper tape is ok

## 2024-07-11 ENCOUNTER — Other Ambulatory Visit

## 2024-07-13 ENCOUNTER — Ambulatory Visit

## 2025-01-06 ENCOUNTER — Ambulatory Visit
